# Patient Record
Sex: Female | Born: 1960 | Race: White | Hispanic: No | Marital: Single | Smoking: Never smoker
Health system: Southern US, Community
[De-identification: ages and names within clinical notes are randomized; demographics above are authoritative.]

## PROBLEM LIST (undated history)

## (undated) DIAGNOSIS — D649 Anemia, unspecified: Secondary | ICD-10-CM

## (undated) DIAGNOSIS — K219 Gastro-esophageal reflux disease without esophagitis: Secondary | ICD-10-CM

## (undated) DIAGNOSIS — I10 Essential (primary) hypertension: Secondary | ICD-10-CM

## (undated) DIAGNOSIS — N200 Calculus of kidney: Secondary | ICD-10-CM

## (undated) DIAGNOSIS — N12 Tubulo-interstitial nephritis, not specified as acute or chronic: Secondary | ICD-10-CM

## (undated) HISTORY — PX: HAND SURGERY: SHX662

## (undated) HISTORY — PX: HERNIA REPAIR: SHX51

---

## 2008-04-09 ENCOUNTER — Emergency Department: Payer: Self-pay | Admitting: Emergency Medicine

## 2011-11-24 ENCOUNTER — Encounter (HOSPITAL_BASED_OUTPATIENT_CLINIC_OR_DEPARTMENT_OTHER): Payer: Self-pay | Admitting: *Deleted

## 2011-11-24 ENCOUNTER — Emergency Department (INDEPENDENT_AMBULATORY_CARE_PROVIDER_SITE_OTHER): Payer: BC Managed Care – PPO

## 2011-11-24 ENCOUNTER — Emergency Department (HOSPITAL_BASED_OUTPATIENT_CLINIC_OR_DEPARTMENT_OTHER)
Admission: EM | Admit: 2011-11-24 | Discharge: 2011-11-24 | Disposition: A | Discharge status: Home / Self Care | Payer: BC Managed Care – PPO | Attending: Emergency Medicine | Admitting: Emergency Medicine

## 2011-11-24 DIAGNOSIS — M25529 Pain in unspecified elbow: Secondary | ICD-10-CM | POA: Insufficient documentation

## 2011-11-24 DIAGNOSIS — M7730 Calcaneal spur, unspecified foot: Secondary | ICD-10-CM | POA: Insufficient documentation

## 2011-11-24 DIAGNOSIS — M6580 Other synovitis and tenosynovitis, unspecified site: Secondary | ICD-10-CM | POA: Insufficient documentation

## 2011-11-24 DIAGNOSIS — M778 Other enthesopathies, not elsewhere classified: Secondary | ICD-10-CM

## 2011-11-24 DIAGNOSIS — M898X9 Other specified disorders of bone, unspecified site: Secondary | ICD-10-CM

## 2011-11-24 MED ORDER — HYDROCODONE-ACETAMINOPHEN 5-325 MG PO TABS: 2.0000 | ORAL_TABLET | ORAL | Status: AC | PRN

## 2011-11-24 NOTE — ED Provider Notes (Signed)
Medical screening examination/treatment/procedure(s) were performed by non-physician practitioner and as supervising physician I was immediately available for consultation/collaboration.  Raeford Razor, MD 11/24/11 2207

## 2011-11-24 NOTE — ED Notes (Signed)
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 Wearing support at triage. Pain has gotten worse since Friday. No relief with Ibuprofen.

## 2011-11-24 NOTE — ED Provider Notes (Addendum)
History     CSN: [id number]  Arrival date & time 11/24/11  1547   First MD Initiated Contact with Patient 11/24/11 1624      Chief Complaint  Patient presents with  . 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 female presenting with arm injury. The history is provided by the patient. No language interpreter was used.  Arm Injury  The incident occurred more than 2 days ago. The injury mechanism is unknown. It is unknown if the wounds were self-inflicted. No protective equipment was used. There is an injury to the right elbow. Pertinent negatives include no numbness and no neck pain.    History reviewed. No pertinent past medical history.  Past Surgical History  Procedure Date  . Hand surgery     History reviewed. No pertinent family history.  History  Substance Use Topics  . Smoking status: Never Smoker   . Smokeless tobacco: Not on file  . Alcohol Use: No    OB History    Grav Para Term Preterm Abortions TAB SAB Ect Mult Living                  Review of Systems  HENT: Negative for neck pain.   Neurological: Negative for numbness.  All other systems reviewed and are negative.    Allergies  Demerol and Pineapple  Home Medications   Current Outpatient Rx  Name Route Sig Dispense Refill  . IBUPROFEN 200 MG PO TABS Oral Take 1,000 mg by mouth once as needed. For pain    . HYDROCODONE-ACETAMINOPHEN 5-325 MG PO TABS Oral Take 2 tablets by mouth every 4 (four) hours as needed for pain. 15 tablet 0    BP 157/73  Pulse 74  Temp(Src) 98.5 F (36.9 C) (Oral)  Resp 20  Ht 5\' 9"  (1.753 m)  Wt 230 lb (104.327 kg)  BMI 33.96 kg/m2  SpO2  100%  LMP 11/07/2011  Physical Exam  Nursing note and vitals reviewed. Constitutional: She is oriented to person, place, and time. She appears well-nourished.  Cardiovascular: Normal rate and regular rhythm.   Pulmonary/Chest: Effort normal and breath sounds normal.  Musculoskeletal: Normal range of motion.       Pt tender to the right elbow:no redness or warmth noted to the area  Neurological: She is alert and oriented to person, place, and time.    ED Course  Procedures (including critical care time)  Labs Reviewed - No data to display Dg Elbow Complete Right  11/24/2011  *RADIOLOGY REPORT*  Clinical Data: Elbow pain.  No known injury.  RIGHT ELBOW - COMPLETE 3+ VIEW  Comparison:  None.  Findings:  There is no evidence of fracture, dislocation, or joint effusion.  There is no evidence of arthropathy.  Small bone spur is seen arising from the olecranon process.  Soft tissues are unremarkable.  IMPRESSION: 1.  No acute findings. 2.  Small olecranon bone spur.  Original Report Authenticated By: Danae Orleans, M.D.     1. Elbow tendinitis       MDM  Will treat symptomatically        Teressa Lower, NP 11/24/11 1826  Teressa Lower, NP 12/10/11 1541

## 2011-12-12 NOTE — ED Provider Notes (Signed)
Medical screening examination/treatment/procedure(s) were performed by non-physician practitioner and as supervising physician I was immediately available for consultation/collaboration.  Dwight Burdo, MD 12/12/11 0800 

## 2013-06-17 DIAGNOSIS — E209 Hypoparathyroidism, unspecified: Secondary | ICD-10-CM | POA: Insufficient documentation

## 2013-06-17 DIAGNOSIS — E282 Polycystic ovarian syndrome: Secondary | ICD-10-CM | POA: Diagnosis present

## 2013-06-17 DIAGNOSIS — E559 Vitamin D deficiency, unspecified: Secondary | ICD-10-CM | POA: Insufficient documentation

## 2014-08-03 DIAGNOSIS — N12 Tubulo-interstitial nephritis, not specified as acute or chronic: Secondary | ICD-10-CM

## 2014-08-03 HISTORY — DX: Tubulo-interstitial nephritis, not specified as acute or chronic: N12

## 2017-11-03 ENCOUNTER — Emergency Department (HOSPITAL_BASED_OUTPATIENT_CLINIC_OR_DEPARTMENT_OTHER): Payer: BLUE CROSS/BLUE SHIELD

## 2017-11-03 ENCOUNTER — Ambulatory Visit (HOSPITAL_COMMUNITY)
Admission: RE | Admit: 2017-11-03 | Discharge: 2017-11-03 | Disposition: A | Discharge status: Home / Self Care | Payer: BLUE CROSS/BLUE SHIELD | Source: Ambulatory Visit

## 2017-11-03 ENCOUNTER — Inpatient Hospital Stay (HOSPITAL_BASED_OUTPATIENT_CLINIC_OR_DEPARTMENT_OTHER)
Admission: EM | Admit: 2017-11-03 | Discharge: 2017-12-19 | DRG: 438 | Disposition: E | Payer: BLUE CROSS/BLUE SHIELD | Attending: Pulmonary Disease | Admitting: Pulmonary Disease

## 2017-11-03 ENCOUNTER — Inpatient Hospital Stay (HOSPITAL_COMMUNITY): Payer: BLUE CROSS/BLUE SHIELD

## 2017-11-03 ENCOUNTER — Ambulatory Visit (HOSPITAL_COMMUNITY): Admission: EM | Admit: 2017-11-03 | Payer: BLUE CROSS/BLUE SHIELD | Source: Other Acute Inpatient Hospital

## 2017-11-03 ENCOUNTER — Encounter (HOSPITAL_BASED_OUTPATIENT_CLINIC_OR_DEPARTMENT_OTHER): Payer: Self-pay | Admitting: *Deleted

## 2017-11-03 ENCOUNTER — Other Ambulatory Visit: Payer: Self-pay

## 2017-11-03 DIAGNOSIS — B370 Candidal stomatitis: Secondary | ICD-10-CM | POA: Diagnosis not present

## 2017-11-03 DIAGNOSIS — J9601 Acute respiratory failure with hypoxia: Secondary | ICD-10-CM | POA: Diagnosis not present

## 2017-11-03 DIAGNOSIS — K7581 Nonalcoholic steatohepatitis (NASH): Secondary | ICD-10-CM | POA: Diagnosis present

## 2017-11-03 DIAGNOSIS — G9341 Metabolic encephalopathy: Secondary | ICD-10-CM | POA: Diagnosis not present

## 2017-11-03 DIAGNOSIS — K851 Biliary acute pancreatitis without necrosis or infection: Secondary | ICD-10-CM | POA: Diagnosis present

## 2017-11-03 DIAGNOSIS — E8770 Fluid overload, unspecified: Secondary | ICD-10-CM | POA: Diagnosis not present

## 2017-11-03 DIAGNOSIS — R0902 Hypoxemia: Secondary | ICD-10-CM

## 2017-11-03 DIAGNOSIS — E870 Hyperosmolality and hypernatremia: Secondary | ICD-10-CM | POA: Diagnosis present

## 2017-11-03 DIAGNOSIS — D649 Anemia, unspecified: Secondary | ICD-10-CM | POA: Diagnosis present

## 2017-11-03 DIAGNOSIS — Z452 Encounter for adjustment and management of vascular access device: Secondary | ICD-10-CM

## 2017-11-03 DIAGNOSIS — Z91018 Allergy to other foods: Secondary | ICD-10-CM

## 2017-11-03 DIAGNOSIS — K8500 Idiopathic acute pancreatitis without necrosis or infection: Secondary | ICD-10-CM | POA: Diagnosis not present

## 2017-11-03 DIAGNOSIS — J9602 Acute respiratory failure with hypercapnia: Secondary | ICD-10-CM | POA: Diagnosis not present

## 2017-11-03 DIAGNOSIS — Z885 Allergy status to narcotic agent status: Secondary | ICD-10-CM

## 2017-11-03 DIAGNOSIS — K8689 Other specified diseases of pancreas: Secondary | ICD-10-CM | POA: Diagnosis not present

## 2017-11-03 DIAGNOSIS — E282 Polycystic ovarian syndrome: Secondary | ICD-10-CM | POA: Diagnosis present

## 2017-11-03 DIAGNOSIS — K861 Other chronic pancreatitis: Secondary | ICD-10-CM | POA: Diagnosis not present

## 2017-11-03 DIAGNOSIS — N179 Acute kidney failure, unspecified: Secondary | ICD-10-CM | POA: Diagnosis not present

## 2017-11-03 DIAGNOSIS — R188 Other ascites: Secondary | ICD-10-CM | POA: Diagnosis present

## 2017-11-03 DIAGNOSIS — Z0189 Encounter for other specified special examinations: Secondary | ICD-10-CM

## 2017-11-03 DIAGNOSIS — R110 Nausea: Secondary | ICD-10-CM | POA: Diagnosis not present

## 2017-11-03 DIAGNOSIS — B952 Enterococcus as the cause of diseases classified elsewhere: Secondary | ICD-10-CM | POA: Diagnosis present

## 2017-11-03 DIAGNOSIS — Z6841 Body Mass Index (BMI) 40.0 and over, adult: Secondary | ICD-10-CM

## 2017-11-03 DIAGNOSIS — R011 Cardiac murmur, unspecified: Secondary | ICD-10-CM | POA: Diagnosis present

## 2017-11-03 DIAGNOSIS — R0602 Shortness of breath: Secondary | ICD-10-CM

## 2017-11-03 DIAGNOSIS — N170 Acute kidney failure with tubular necrosis: Secondary | ICD-10-CM | POA: Diagnosis present

## 2017-11-03 DIAGNOSIS — K8591 Acute pancreatitis with uninfected necrosis, unspecified: Secondary | ICD-10-CM | POA: Diagnosis not present

## 2017-11-03 DIAGNOSIS — K2980 Duodenitis without bleeding: Secondary | ICD-10-CM | POA: Diagnosis present

## 2017-11-03 DIAGNOSIS — K863 Pseudocyst of pancreas: Secondary | ICD-10-CM | POA: Diagnosis present

## 2017-11-03 DIAGNOSIS — Z4659 Encounter for fitting and adjustment of other gastrointestinal appliance and device: Secondary | ICD-10-CM

## 2017-11-03 DIAGNOSIS — E875 Hyperkalemia: Secondary | ICD-10-CM | POA: Diagnosis not present

## 2017-11-03 DIAGNOSIS — N390 Urinary tract infection, site not specified: Secondary | ICD-10-CM | POA: Diagnosis present

## 2017-11-03 DIAGNOSIS — K8512 Biliary acute pancreatitis with infected necrosis: Secondary | ICD-10-CM | POA: Diagnosis not present

## 2017-11-03 DIAGNOSIS — Z515 Encounter for palliative care: Secondary | ICD-10-CM | POA: Diagnosis present

## 2017-11-03 DIAGNOSIS — Z978 Presence of other specified devices: Secondary | ICD-10-CM

## 2017-11-03 DIAGNOSIS — J9811 Atelectasis: Secondary | ICD-10-CM | POA: Diagnosis present

## 2017-11-03 DIAGNOSIS — K8020 Calculus of gallbladder without cholecystitis without obstruction: Secondary | ICD-10-CM

## 2017-11-03 DIAGNOSIS — A419 Sepsis, unspecified organism: Secondary | ICD-10-CM | POA: Diagnosis not present

## 2017-11-03 DIAGNOSIS — R1013 Epigastric pain: Secondary | ICD-10-CM | POA: Diagnosis present

## 2017-11-03 DIAGNOSIS — Z87442 Personal history of urinary calculi: Secondary | ICD-10-CM

## 2017-11-03 DIAGNOSIS — D6489 Other specified anemias: Secondary | ICD-10-CM | POA: Diagnosis present

## 2017-11-03 DIAGNOSIS — J9621 Acute and chronic respiratory failure with hypoxia: Secondary | ICD-10-CM | POA: Diagnosis not present

## 2017-11-03 DIAGNOSIS — N183 Chronic kidney disease, stage 3 (moderate): Secondary | ICD-10-CM | POA: Diagnosis present

## 2017-11-03 DIAGNOSIS — Z9911 Dependence on respirator [ventilator] status: Secondary | ICD-10-CM

## 2017-11-03 DIAGNOSIS — K859 Acute pancreatitis without necrosis or infection, unspecified: Secondary | ICD-10-CM | POA: Diagnosis not present

## 2017-11-03 DIAGNOSIS — Z66 Do not resuscitate: Secondary | ICD-10-CM | POA: Diagnosis not present

## 2017-11-03 DIAGNOSIS — K8511 Biliary acute pancreatitis with uninfected necrosis: Principal | ICD-10-CM | POA: Diagnosis present

## 2017-11-03 DIAGNOSIS — E43 Unspecified severe protein-calorie malnutrition: Secondary | ICD-10-CM | POA: Diagnosis present

## 2017-11-03 DIAGNOSIS — K567 Ileus, unspecified: Secondary | ICD-10-CM | POA: Diagnosis not present

## 2017-11-03 DIAGNOSIS — R5381 Other malaise: Secondary | ICD-10-CM | POA: Diagnosis not present

## 2017-11-03 DIAGNOSIS — R109 Unspecified abdominal pain: Secondary | ICD-10-CM | POA: Diagnosis not present

## 2017-11-03 DIAGNOSIS — Y95 Nosocomial condition: Secondary | ICD-10-CM | POA: Diagnosis not present

## 2017-11-03 DIAGNOSIS — R1084 Generalized abdominal pain: Secondary | ICD-10-CM | POA: Diagnosis not present

## 2017-11-03 DIAGNOSIS — R0603 Acute respiratory distress: Secondary | ICD-10-CM

## 2017-11-03 DIAGNOSIS — K8070 Calculus of gallbladder and bile duct without cholecystitis without obstruction: Secondary | ICD-10-CM | POA: Diagnosis present

## 2017-11-03 DIAGNOSIS — J189 Pneumonia, unspecified organism: Secondary | ICD-10-CM | POA: Diagnosis not present

## 2017-11-03 DIAGNOSIS — Z79899 Other long term (current) drug therapy: Secondary | ICD-10-CM

## 2017-11-03 DIAGNOSIS — E874 Mixed disorder of acid-base balance: Secondary | ICD-10-CM | POA: Diagnosis present

## 2017-11-03 DIAGNOSIS — J9600 Acute respiratory failure, unspecified whether with hypoxia or hypercapnia: Secondary | ICD-10-CM

## 2017-11-03 DIAGNOSIS — R140 Abdominal distension (gaseous): Secondary | ICD-10-CM | POA: Diagnosis not present

## 2017-11-03 DIAGNOSIS — Z01818 Encounter for other preprocedural examination: Secondary | ICD-10-CM

## 2017-11-03 DIAGNOSIS — R6521 Severe sepsis with septic shock: Secondary | ICD-10-CM | POA: Diagnosis not present

## 2017-11-03 DIAGNOSIS — K219 Gastro-esophageal reflux disease without esophagitis: Secondary | ICD-10-CM | POA: Diagnosis present

## 2017-11-03 DIAGNOSIS — D72829 Elevated white blood cell count, unspecified: Secondary | ICD-10-CM | POA: Diagnosis not present

## 2017-11-03 DIAGNOSIS — K831 Obstruction of bile duct: Secondary | ICD-10-CM | POA: Diagnosis not present

## 2017-11-03 DIAGNOSIS — K2970 Gastritis, unspecified, without bleeding: Secondary | ICD-10-CM | POA: Diagnosis present

## 2017-11-03 DIAGNOSIS — F419 Anxiety disorder, unspecified: Secondary | ICD-10-CM | POA: Diagnosis present

## 2017-11-03 DIAGNOSIS — R6511 Systemic inflammatory response syndrome (SIRS) of non-infectious origin with acute organ dysfunction: Secondary | ICD-10-CM | POA: Diagnosis not present

## 2017-11-03 DIAGNOSIS — E876 Hypokalemia: Secondary | ICD-10-CM | POA: Diagnosis not present

## 2017-11-03 DIAGNOSIS — K8502 Idiopathic acute pancreatitis with infected necrosis: Secondary | ICD-10-CM | POA: Diagnosis not present

## 2017-11-03 DIAGNOSIS — I129 Hypertensive chronic kidney disease with stage 1 through stage 4 chronic kidney disease, or unspecified chronic kidney disease: Secondary | ICD-10-CM | POA: Diagnosis present

## 2017-11-03 DIAGNOSIS — J9690 Respiratory failure, unspecified, unspecified whether with hypoxia or hypercapnia: Secondary | ICD-10-CM

## 2017-11-03 DIAGNOSIS — R739 Hyperglycemia, unspecified: Secondary | ICD-10-CM | POA: Diagnosis present

## 2017-11-03 DIAGNOSIS — N200 Calculus of kidney: Secondary | ICD-10-CM

## 2017-11-03 DIAGNOSIS — E869 Volume depletion, unspecified: Secondary | ICD-10-CM | POA: Diagnosis present

## 2017-11-03 DIAGNOSIS — N946 Dysmenorrhea, unspecified: Secondary | ICD-10-CM | POA: Diagnosis not present

## 2017-11-03 HISTORY — DX: Anemia, unspecified: D64.9

## 2017-11-03 HISTORY — DX: Essential (primary) hypertension: I10

## 2017-11-03 HISTORY — DX: Gastro-esophageal reflux disease without esophagitis: K21.9

## 2017-11-03 HISTORY — DX: Calculus of kidney: N20.0

## 2017-11-03 HISTORY — DX: Tubulo-interstitial nephritis, not specified as acute or chronic: N12

## 2017-11-03 LAB — URINALYSIS, MICROSCOPIC (REFLEX)

## 2017-11-03 LAB — URINALYSIS, ROUTINE W REFLEX MICROSCOPIC
Glucose, UA: NEGATIVE mg/dL
Hgb urine dipstick: NEGATIVE
Ketones, ur: 15 mg/dL — AB
Leukocytes, UA: NEGATIVE
NITRITE: NEGATIVE
PH: 7 (ref 5.0–8.0)
Protein, ur: 30 mg/dL — AB
SPECIFIC GRAVITY, URINE: 1.01 (ref 1.005–1.030)

## 2017-11-03 LAB — RAPID URINE DRUG SCREEN, HOSP PERFORMED
AMPHETAMINES: NOT DETECTED
Barbiturates: NOT DETECTED
Benzodiazepines: NOT DETECTED
Cocaine: NOT DETECTED
Opiates: POSITIVE — AB
TETRAHYDROCANNABINOL: NOT DETECTED

## 2017-11-03 LAB — CBC WITH DIFFERENTIAL/PLATELET
BASOS PCT: 0 %
Basophils Absolute: 0 10*3/uL (ref 0.0–0.1)
Eosinophils Absolute: 0.1 10*3/uL (ref 0.0–0.7)
Eosinophils Relative: 1 %
HEMATOCRIT: 46 % (ref 36.0–46.0)
HEMOGLOBIN: 16.2 g/dL — AB (ref 12.0–15.0)
LYMPHS ABS: 1.3 10*3/uL (ref 0.7–4.0)
LYMPHS PCT: 12 %
MCH: 30.2 pg (ref 26.0–34.0)
MCHC: 35.2 g/dL (ref 30.0–36.0)
MCV: 85.7 fL (ref 78.0–100.0)
MONOS PCT: 7 %
Monocytes Absolute: 0.8 10*3/uL (ref 0.1–1.0)
NEUTROS ABS: 9.2 10*3/uL — AB (ref 1.7–7.7)
NEUTROS PCT: 80 %
Platelets: 327 10*3/uL (ref 150–400)
RBC: 5.37 MIL/uL — ABNORMAL HIGH (ref 3.87–5.11)
RDW: 14.5 % (ref 11.5–15.5)
WBC: 11.5 10*3/uL — ABNORMAL HIGH (ref 4.0–10.5)

## 2017-11-03 LAB — COMPREHENSIVE METABOLIC PANEL
ALT: 601 U/L — ABNORMAL HIGH (ref 14–54)
ANION GAP: 13 (ref 5–15)
AST: 497 U/L — ABNORMAL HIGH (ref 15–41)
Albumin: 4.4 g/dL (ref 3.5–5.0)
Alkaline Phosphatase: 159 U/L — ABNORMAL HIGH (ref 38–126)
BUN: 16 mg/dL (ref 6–20)
CALCIUM: 10 mg/dL (ref 8.9–10.3)
CHLORIDE: 102 mmol/L (ref 101–111)
CO2: 24 mmol/L (ref 22–32)
Creatinine, Ser: 1.33 mg/dL — ABNORMAL HIGH (ref 0.44–1.00)
GFR calc non Af Amer: 44 mL/min — ABNORMAL LOW (ref >60)
GFR, EST AFRICAN AMERICAN: 51 mL/min — AB (ref >60)
Glucose, Bld: 131 mg/dL — ABNORMAL HIGH (ref 65–99)
POTASSIUM: 3.5 mmol/L (ref 3.5–5.1)
SODIUM: 139 mmol/L (ref 135–145)
Total Bilirubin: 4.4 mg/dL — ABNORMAL HIGH (ref 0.3–1.2)
Total Protein: 7.9 g/dL (ref 6.5–8.1)

## 2017-11-03 LAB — MRSA PCR SCREENING: MRSA BY PCR: NEGATIVE

## 2017-11-03 LAB — LIPASE, BLOOD: Lipase: >10000 U/L — ABNORMAL HIGH (ref 11–51)

## 2017-11-03 LAB — ETHANOL: Alcohol, Ethyl (B): <10 mg/dL (ref <10)

## 2017-11-03 LAB — LACTATE DEHYDROGENASE: LDH: 404 U/L — ABNORMAL HIGH (ref 98–192)

## 2017-11-03 MED ORDER — LORAZEPAM 2 MG/ML IJ SOLN
1.0000 mg | Freq: Once | INTRAMUSCULAR | Status: AC
  Administered 2017-11-03: 1 mg via INTRAVENOUS
  Filled 2017-11-03: qty 1

## 2017-11-03 MED ORDER — FENTANYL CITRATE (PF) 100 MCG/2ML IJ SOLN
25.0000 ug | Freq: Once | INTRAMUSCULAR | Status: AC
  Administered 2017-11-03: 25 ug via INTRAVENOUS
  Filled 2017-11-03: qty 2

## 2017-11-03 MED ORDER — METOCLOPRAMIDE HCL 5 MG/ML IJ SOLN
10.0000 mg | Freq: Once | INTRAMUSCULAR | Status: DC
  Filled 2017-11-03: qty 2

## 2017-11-03 MED ORDER — HALOPERIDOL LACTATE 5 MG/ML IJ SOLN
2.0000 mg | Freq: Once | INTRAMUSCULAR | Status: AC
  Administered 2017-11-03: 2 mg via INTRAVENOUS
  Filled 2017-11-03: qty 1

## 2017-11-03 MED ORDER — SODIUM CHLORIDE 0.9 % IV BOLUS (SEPSIS)
1000.0000 mL | Freq: Once | INTRAVENOUS | Status: AC
  Administered 2017-11-03: 1000 mL via INTRAVENOUS

## 2017-11-03 MED ORDER — HYDROMORPHONE HCL 1 MG/ML IJ SOLN: 1.0000 mg | INTRAMUSCULAR | Status: DC | PRN

## 2017-11-03 MED ORDER — HYDROMORPHONE HCL 1 MG/ML IJ SOLN
0.5000 mg | Freq: Once | INTRAMUSCULAR | Status: DC
  Filled 2017-11-03: qty 1

## 2017-11-03 MED ORDER — ENOXAPARIN SODIUM 40 MG/0.4ML ~~LOC~~ SOLN
40.0000 mg | SUBCUTANEOUS | Status: DC
  Administered 2017-11-03 – 2017-11-04 (×2): 40 mg via SUBCUTANEOUS
  Filled 2017-11-03 (×2): qty 0.4

## 2017-11-03 MED ORDER — ONDANSETRON HCL 4 MG/2ML IJ SOLN
4.0000 mg | Freq: Four times a day (QID) | INTRAMUSCULAR | Status: DC | PRN
  Administered 2017-11-04 – 2017-11-05 (×3): 4 mg via INTRAVENOUS
  Filled 2017-11-03 (×3): qty 2

## 2017-11-03 MED ORDER — FENTANYL CITRATE (PF) 100 MCG/2ML IJ SOLN
50.0000 ug | Freq: Once | INTRAMUSCULAR | Status: AC
  Administered 2017-11-03: 50 ug via INTRAVENOUS
  Filled 2017-11-03: qty 2

## 2017-11-03 MED ORDER — METOCLOPRAMIDE HCL 5 MG/ML IJ SOLN
10.0000 mg | Freq: Once | INTRAMUSCULAR | Status: AC
  Administered 2017-11-03: 10 mg via INTRAVENOUS
  Filled 2017-11-03: qty 2

## 2017-11-03 MED ORDER — MORPHINE SULFATE (PF) 4 MG/ML IV SOLN
8.0000 mg | Freq: Once | INTRAVENOUS | Status: DC
  Filled 2017-11-03: qty 2

## 2017-11-03 MED ORDER — MORPHINE SULFATE (PF) 4 MG/ML IV SOLN
4.0000 mg | Freq: Once | INTRAVENOUS | Status: AC
  Administered 2017-11-03: 4 mg via INTRAVENOUS
  Filled 2017-11-03: qty 1

## 2017-11-03 MED ORDER — PROMETHAZINE HCL 25 MG/ML IJ SOLN
12.5000 mg | Freq: Once | INTRAMUSCULAR | Status: DC
  Filled 2017-11-03: qty 1

## 2017-11-03 MED ORDER — SODIUM CHLORIDE 0.9 % IV SOLN
INTRAVENOUS | Status: DC
  Administered 2017-11-03 – 2017-11-04 (×3): via INTRAVENOUS

## 2017-11-03 MED ORDER — PROMETHAZINE HCL 25 MG/ML IJ SOLN
25.0000 mg | Freq: Once | INTRAMUSCULAR | Status: AC
Start: 2017-11-03 — End: 2017-11-03
  Administered 2017-11-03: 25 mg via INTRAVENOUS

## 2017-11-03 MED ORDER — PIPERACILLIN-TAZOBACTAM 3.375 G IVPB 30 MIN
3.3750 g | Freq: Once | INTRAVENOUS | Status: AC
  Administered 2017-11-03: 3.375 g via INTRAVENOUS
  Filled 2017-11-03 (×2): qty 50

## 2017-11-03 MED ORDER — PROMETHAZINE HCL 25 MG/ML IJ SOLN
12.5000 mg | Freq: Once | INTRAMUSCULAR | Status: AC
  Administered 2017-11-03: 12.5 mg via INTRAVENOUS
  Filled 2017-11-03: qty 1

## 2017-11-03 MED ORDER — HYDROMORPHONE HCL 1 MG/ML IJ SOLN: 1.0000 mg | Freq: Once | INTRAMUSCULAR | Status: DC

## 2017-11-03 MED ORDER — ONDANSETRON HCL 4 MG/2ML IJ SOLN
4.0000 mg | Freq: Once | INTRAMUSCULAR | Status: AC
  Administered 2017-11-03: 4 mg via INTRAVENOUS
  Filled 2017-11-03: qty 2

## 2017-11-03 MED ORDER — MORPHINE SULFATE (PF) 4 MG/ML IV SOLN
2.0000 mg | INTRAVENOUS | Status: DC | PRN
  Administered 2017-11-03 – 2017-11-04 (×4): 2 mg via INTRAVENOUS
  Filled 2017-11-03 (×4): qty 1

## 2017-11-03 MED ORDER — FENTANYL CITRATE (PF) 100 MCG/2ML IJ SOLN
50.0000 ug | Freq: Once | INTRAMUSCULAR | Status: AC
Start: 2017-11-03 — End: 2017-11-03
  Administered 2017-11-03: 50 ug via INTRAVENOUS
  Filled 2017-11-03: qty 2

## 2017-11-03 MED ORDER — METOCLOPRAMIDE HCL 5 MG/ML IJ SOLN
5.0000 mg | Freq: Four times a day (QID) | INTRAMUSCULAR | Status: DC | PRN
  Administered 2017-11-04 – 2017-11-06 (×7): 5 mg via INTRAVENOUS
  Filled 2017-11-03 (×7): qty 2

## 2017-11-03 MED ORDER — HYDRALAZINE HCL 20 MG/ML IJ SOLN: 10.0000 mg | Freq: Four times a day (QID) | INTRAMUSCULAR | Status: DC | PRN

## 2017-11-03 MED ORDER — PANTOPRAZOLE SODIUM 40 MG IV SOLR
40.0000 mg | Freq: Once | INTRAVENOUS | Status: AC
  Administered 2017-11-03: 40 mg via INTRAVENOUS
  Filled 2017-11-03: qty 40

## 2017-11-03 MED ORDER — MORPHINE SULFATE (PF) 4 MG/ML IV SOLN
1.0000 mg | INTRAVENOUS | Status: DC | PRN
  Administered 2017-11-03: 1 mg via INTRAVENOUS
  Filled 2017-11-03: qty 1

## 2017-11-03 NOTE — ED Provider Notes (Signed)
[facility] EMERGENCY DEPARTMENT Provider Note   CSN: [id number] Arrival date & time: 24-Nov-2017  1031     History   Chief Complaint Chief Complaint  Patient presents with  . Flank Pain    HPI Emma Washington is a 57 y.o. female.  HPI 57 year old female past medical history significant for nephrolithiasis, hypertension presents to the emergency department today for evaluation of left flank pain with associated hematuria and dysuria.  Patient states that she has a known renal stone and has seen urology in the past.  Patient states the pain started yesterday and has progressively worsened.  Patient describes the pain as sharp in nature.  She states that "feels like it is moving through my bladder".  Patient reports associated nausea and emesis.  She has had several episodes of bilious emesis prior to arrival.  Patient rates the pain a 10 out of 10.  She try to take 3 Advil this morning for pain however she states that she threw them up.  Patient denies any associated fevers or chills.  She denies any changes in her bowel habits.  Denies any associated abdominal pain.  Nothing makes it better or worse.  Pt denies any fever, chill, ha, vision changes, lightheadedness, dizziness, congestion, neck pain, cp, sob, cough, abd pain, n/v/d, change in bowel habits, melena, hematochezia, lower extremity paresthesias.  Past Medical History:  Diagnosis Date  . Hypertension     There are no active problems to display for this patient.   Past Surgical History:  Procedure Laterality Date  . HAND SURGERY      OB History    No data available       Home Medications    Prior to Admission medications   Medication Sig Start Date End Date Taking? Authorizing Provider  LISINOPRIL-HYDROCHLOROTHIAZIDE PO Take by mouth.   Yes [provider]  promethazine (PHENERGAN) 25 MG tablet Take 25 mg by mouth every 6 (six) hours as needed for nausea or vomiting.   Yes [provider]    ibuprofen (ADVIL,MOTRIN) 200 MG tablet Take 1,000 mg by mouth once as needed. For pain    [provider]    Family History No family history on file.  Social History Social History   Tobacco Use  . Smoking status: Never Smoker  . Smokeless tobacco: Never Used  Substance Use Topics  . Alcohol use: No  . Drug use: No     Allergies   Demerol and Pineapple   Review of Systems Review of Systems  Constitutional: Negative for chills and fever.  HENT: Negative for congestion.   Eyes: Negative for visual disturbance.  Respiratory: Negative for cough and shortness of breath.   Cardiovascular: Negative for chest pain.  Gastrointestinal: Positive for nausea and vomiting. Negative for abdominal pain and diarrhea.  Genitourinary: Positive for dysuria, flank pain, hematuria and urgency. Negative for frequency, vaginal bleeding and vaginal discharge.  Musculoskeletal: Negative for arthralgias and myalgias.  Skin: Negative for rash.  Neurological: Negative for dizziness, syncope, weakness, light-headedness, numbness and headaches.  Psychiatric/Behavioral: Negative for sleep disturbance. The patient is not nervous/anxious.      Physical Exam Updated Vital Signs Ht 5\' 9"  (1.753 m)   Wt 113.4 kg (250 lb)   LMP 11/07/2011   BMI 36.92 kg/m   Physical Exam  Constitutional: She is oriented to person, place, and time. She appears well-developed and well-nourished.  Non-toxic appearance. She appears distressed.  Patient very uncomfortable due to pain and vomiting.  She is moaning and rolling around on the bed in the room.  Difficult to obtain accurate history and physical exam.  HENT:  Head: Normocephalic and atraumatic.  Nose: Nose normal.  Mouth/Throat: Oropharynx is clear and moist.  Eyes: Conjunctivae are normal. Pupils are equal, round, and reactive to light. Right eye exhibits no discharge. Left eye exhibits no discharge.  Neck: Normal range of motion. Neck supple.   Cardiovascular: Normal rate, regular rhythm, normal heart sounds and intact distal pulses.  Pulmonary/Chest: Effort normal. No respiratory distress. She exhibits no tenderness.  Abdominal: Soft. Bowel sounds are normal. There is tenderness in the epigastric area and left upper quadrant. There is guarding and CVA tenderness. There is no rigidity, no rebound and no tenderness at McBurney's point.  CVA tenderness on the left.  Musculoskeletal: Normal range of motion. She exhibits no tenderness.  Lymphadenopathy:    She has no cervical adenopathy.  Neurological: She is alert and oriented to person, place, and time.  Skin: Skin is warm and dry. Capillary refill takes less than 2 seconds.  Psychiatric: Her behavior is normal. Judgment and thought content normal.  Nursing note and vitals reviewed.    ED Treatments / Results  Labs (all labs ordered are listed, but only abnormal results are displayed) Labs Reviewed  CBC WITH DIFFERENTIAL/PLATELET - Abnormal; Notable for the following components:      Result Value   WBC 11.5 (*)    RBC 5.37 (*)    Hemoglobin 16.2 (*)    Neutro Abs 9.2 (*)    All other components within normal limits  COMPREHENSIVE METABOLIC PANEL  URINALYSIS, ROUTINE W REFLEX MICROSCOPIC    EKG  EKG Interpretation None       Radiology [place] Abdomen Complete  Result Date: 11/01/2017 CLINICAL DATA:  Epigastric and right upper quadrant discomfort, syncope, vomiting, hematuria. EXAM: ABDOMEN ULTRASOUND COMPLETE COMPARISON:  Abdominal and pelvic CT scan of today's date FINDINGS: Gallbladder: Probable small stones. No gallbladder wall thickening or pericholecystic fluid or positive sonographic Murphy's sign. Common bile duct: Diameter: 6.7 mm Liver: Visualization of the hepatic parenchyma is limited by bowel poor penetration of the ultrasound beam. The echotexture is subjectively increased. There is no focal mass or ductal dilation. Portal vein is patent on color Doppler  imaging with normal direction of blood flow towards the liver. IVC: No abnormality visualized. Pancreas: Visualization of the pancreas is limited due to bowel gas. Spleen: Size and appearance within normal limits. Right Kidney: Length: 13.2 cm. Echogenicity within normal limits. No mass or hydronephrosis visualized. Left Kidney: Length: 11.9 cm. Echogenicity within normal limits. No mass or hydronephrosis visualized. Abdominal aorta: Bowel gas limits evaluation of the abdominal aorta. Other findings: No significant ascites is observed. IMPRESSION: The study is limited overall due to bowel gas and the patient's body habitus. Multiple tiny gallstones are suspected. There is mild prominence of the common bile duct without definite intraluminal stones. Increased hepatic echotexture compatible with fatty infiltrative change. Poor evaluation of the pancreas due to bowel gas and surrounding inflammatory changes. Electronically Signed   By: David  SwazilandJordan M.D.   On: 11/20/2017 14:02   Ct Renal Stone Study  Result Date: 10/25/2017 CLINICAL DATA:  Left-sided flank pain and hematuria.  Vomiting. EXAM: CT ABDOMEN AND PELVIS WITHOUT CONTRAST TECHNIQUE: Multidetector CT imaging of the abdomen and pelvis was performed following the standard protocol without oral or intravenous contrast material administration. COMPARISON:  CT abdomen and pelvis April 09, 2008 FINDINGS: Lower chest: There is bibasilar  atelectatic change. Hepatobiliary: There is hepatic steatosis. No focal liver lesions are evident on this noncontrast enhanced study. Gallbladder wall is not appreciably thickened. There is no biliary duct dilatation. Pancreas: There is edema throughout the pancreas with soft tissue stranding in the adjacent peripancreatic mesentery throughout the peripancreatic region. Fluid tracks to the right of the pancreatic head and inferior to the uncinate process as well as anterior to the pancreatic head. Fluid tracks from the body and tail  the pancreas anteriorly to reach the greater curvature of the stomach in the midportion of the gastric body region. A well-defined pseudocyst type lesion is not seen. There is no pancreatic duct dilatation. Spleen: No splenic lesions are evident. Adrenals/Urinary Tract: Adrenals bilaterally appear normal. Kidneys bilaterally show no evident mass or hydronephrosis. There is a 1 mm calculus in the upper pole left kidney, nonobstructing. There is no evident ureteral calculus on either side. Urinary bladder is midline with wall thickness within normal limits. Stomach/Bowel: There is mild wall thickening along the greater curvature of the stomach in the midbody region in along the second and third duodenum due to nearby pancreatitis. No other bowel wall thickening is evident. There is no appreciable bowel obstruction. No evident free air or portal venous air. Vascular/Lymphatic: There is no abdominal aortic aneurysm. No vascular lesions are appreciable on this study. No adenopathy is evident in the abdomen or pelvis. Reproductive: Uterus is anteverted. There is an apparent leiomyoma extending from the leftward aspect of the uterus anteriorly measuring 4.3 x 4.0 cm. No extrauterine pelvic mass evident. Other: Appendix appears normal. No abscess is seen in the abdomen or pelvis. No free pelvic fluid seen. Loculated fluid adjacent to the pancreas is noted. Musculoskeletal: There is degenerative change in the lumbar spine. There are no blastic or lytic bone lesions. There is no intramuscular or abdominal wall lesion evident. IMPRESSION: 1. Acute pancreatitis with pancreatic edema and peripancreatic fluid, most notably anterior to the body and tail of the pancreas. No well-defined pseudocyst or noncystic mass. No pancreatic duct dilatation. 2. Areas of gastritis and duodenitis due to adjacent pancreatitis causing inflammation in these areas. No bowel obstruction. No abscess. Appendix appears normal. 3. 1 mm nonobstructing  calculus upper pole left kidney. No hydronephrosis or ureteral calculus on either side. 4.  Hepatic steatosis. 5. 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 Electronically Signed   By: Bretta Bang III M.D.   On: 11/14/2017 12:04    Procedures Procedures (including critical care time)  Medications Ordered in ED Medications  morphine 4 MG/ML injection 4 mg (4 mg Intravenous Given 11/15/2017 1044)  ondansetron (ZOFRAN) injection 4 mg (4 mg Intravenous Given 10/23/2017 1044)  promethazine (PHENERGAN) injection 25 mg (25 mg Intravenous Given 11/20/2017 1053)     Initial Impression / Assessment and Plan / ED Course  I have reviewed the triage vital signs and the nursing notes.  Pertinent labs & imaging results that were available during my care of the patient were reviewed by me and considered in my medical decision making (see chart for details).     Patient presents to the ED with complaints of left flank pain with associated hematuria.  She also reports associated nausea, emesis.  Patient has known nephrolithiasis.   On exam patient is afebrile.  She is not hypotensive or tachycardic.  Patient is is very uncomfortable due to the pain and rolling around on the bed moaning.  Initial lab  work returned that shows a mild leukocytosis of 11,000.  She also has a mildly elevated creatinine of 1.33.  Her liver enzymes are significantly elevated.  Along with her bilirubin which is 4.4.  Lipase is pending at this time.  Patient's pain has been difficult to manage in the ED.  Patient is very sensitive to pain medication.  Her blood pressure keeps dropping with pain medicine.  Have ordered to bolus of IV fluids.  Patient's blood pressure is maintaining at 100 systolic at this time.  Additional pain medication was ordered.  Patient's was transported CT scan which was performed.    Ct abd/pelvis;  IMPRESSION: 1. Acute pancreatitis with  pancreatic edema and peripancreatic fluid, most notably anterior to the body and tail of the pancreas. No well-defined pseudocyst or noncystic mass. No pancreatic duct dilatation. 2. Areas of gastritis and duodenitis due to adjacent pancreatitis causing inflammation in these areas. No bowel obstruction. No abscess. Appendix appears normal. 3. 1 mm nonobstructing calculus upper pole left kidney. No hydronephrosis or ureteral calculus on either side. 4. Hepatic steatosis. 5. 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   Concern for gallstone pancreatitis.  Ultrasound of abdomen was ordered at this time to assess for common bile duct diameter.  Ultrasound was limited due to patient's body habitus and bowel gas.  Does report some small stones.  Common bile duct appears to be 6.7 mm.  No signs of cholecystic fluid or acute cholecystitis.  1445-I spoke with Marla Roe PA-C with [facility] who recommends that patient needs hospital admission.  She has no further recommendations at this time.  Spoke with Dr. Roda Shutters spittle medicine at [facility].  She is agreed to accept the patient in transfer for admission.  She is asked that I place a Foley and obtain a UDS, UA and alcohol level.  Patient remains hemodynamically stable at this time.  Her blood pressures have improved.  Patient continues have pain and nausea.  EKG was obtained to check for QT which was normal.  Haldol was given which has helped patient.  You are waiting for a bed at this time.  Patient still complains of pain at 1500.  Additional fentanyl was prescribed.  Patient resting comfortably in the bed at this time.  Patient vital signs remained reassuring with improving blood pressure.   Final Clinical Impressions(s) / ED Diagnoses   Final diagnoses:  Gallstone pancreatitis    ED Discharge Orders    None       Wallace Keller 11-10-17 1613    Shaune Pollack,  MD 11/04/17 [id number]

## 2017-11-03 NOTE — H&P (Signed)
TRH H&P   Patient Demographics:    Emma Washington, is a 57 y.o. female  MRN: [id number]   DOB - May 28, 1961  Admit Date - 10/28/2017  Outpatient Primary MD for the patient is Verneita Griffes, MD  Referring MD/NP/PA:  Ocie Cornfield  Outpatient Specialists:  Lawanna Kobus [place], [place])   Patient coming from: home   Chief Complaint  Patient presents with  . Flank Pain      HPI:    Emma Washington  is a 57 y.o. female, w Jerrye Bushy, h/o nephrolithiasis, apparently c/o epigastric, discomfort since 4 pm yesterday.  Pt had subsequent n/v, without any bloody emesis.  Pt states pain sharp, severe, fairly constant and then noted some ? Blood in urine,  Her urine was dark and she thought might have kidney stone and presented to [facility] for evaluation.   In, ED Na 139, K 3.5,  Glucose 131, Bun 16, Creatinine 1.33  Ast 497, Alt 601, Alk phos 159, T. Bili 4.4 Lipase >10,000 CT scan abd/ pelvis IMPRESSION: 1. Acute pancreatitis with pancreatic edema and peripancreatic fluid, most notably anterior to the body and tail of the pancreas. No well-defined pseudocyst or noncystic mass. No pancreatic duct dilatation.  2. Areas of gastritis and duodenitis due to adjacent pancreatitis causing inflammation in these areas. No bowel obstruction. No abscess. Appendix appears normal.  3. 1 mm nonobstructing calculus upper pole left kidney. No hydronephrosis or ureteral calculus on either side.  4.  Hepatic steatosis.  5. 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 Multiple tiny gallstones are suspected. There is mild prominence of the common bile duct without definite intraluminal stones.  Increased hepatic  echotexture compatible with fatty infiltrative change.  Poor evaluation of the pancreas due to bowel gas and surrounding inflammatory changes.  [facility] consulted in afternoon and recommended MRCP.  Pt was transported to [facility], and MRCP has been ordered.  I have contacted GI and notified them that the patient has arrived and we are trying to proceed with MRI as they have suggested.      Review of systems:    In addition to the HPI above,  No Fever-chills, No Headache, No changes with Vision or hearing, No problems swallowing food or Liquids, No Chest pain, Cough or Shortness of Breath, No Blood in stool or Urine, No dysuria, No new skin rashes or bruises, No new joints pains-aches,  No new weakness, tingling, numbness in any extremity, No recent weight gain or loss, No polyuria, polydypsia or polyphagia, No significant Mental Stressors.  A full 10 point Review of Systems was done, except as stated above, all other Review of Systems were negative.   With Past History of the  following :    Past Medical History:  Diagnosis Date  . Anemia   . GERD (gastroesophageal reflux disease)   . Hypertension   . Nephrolithiasis       Past Surgical History:  Procedure Laterality Date  . HAND SURGERY    . HERNIA REPAIR        Social History:     Social History   Tobacco Use  . Smoking status: Never Smoker  . Smokeless tobacco: Never Used  Substance Use Topics  . Alcohol use: No     Lives - at home  Mobility - walks by self   Family History :     Family History  Problem Relation Age of Onset  . Pancreatitis Brother       Home Medications:   Prior to Admission medications   Medication Sig Start Date End Date Taking? Authorizing Provider  ibuprofen (ADVIL,MOTRIN) 200 MG tablet Take 1,000 mg by mouth once as needed. For pain   Yes [provider]  lisinopril-hydrochlorothiazide (PRINZIDE,ZESTORETIC) 20-25 MG tablet Take 1 tablet by mouth daily as  needed (hypertension).    Yes [provider]  promethazine (PHENERGAN) 25 MG tablet Take 25 mg by mouth every 6 (six) hours as needed for nausea or vomiting.   Yes [provider]     Allergies:     Allergies  Allergen Reactions  . Demerol Other (See Comments)    Severe hypotension  . Meperidine     Other reaction(s): Hypotension, Hypotension (ALLERGY/intolerance)  . Other Anaphylaxis    Pineapple  . Pineapple Swelling and Anaphylaxis     Physical Exam:   Vitals  Blood pressure (!) 182/100, pulse 99, temperature 98.5 F (36.9 C), temperature source Oral, resp. rate 20, height '5\' 9"'  (1.753 m), weight 134.3 kg (296 lb 1.2 oz), last menstrual period 11/07/2011, SpO2 95 %.   1. General  lying in bed in NAD,    2. Normal affect and insight, Not Suicidal or Homicidal, Awake Alert, Oriented X 3.  3. No F.N deficits, ALL C.Nerves Intact, Strength 5/5 all 4 extremities, Sensation intact all 4 extremities, Plantars down going.  4. Ears and Eyes appear Normal, Conjunctivae clear, PERRLA. Moist Oral Mucosa.  5. Supple Neck, No JVD, No cervical lymphadenopathy appriciated, No Carotid Bruits.  6. Symmetrical Chest wall movement, Good air movement bilaterally, CTAB.  7. RRR, No Gallops, Rubs or Murmurs, No Parasternal Heave.  8. Positive Bowel Sounds, Abdomen Soft, No tenderness, No organomegaly appriciated,No rebound -guarding or rigidity.  9.  No Cyanosis, Normal Skin Turgor, No Skin Rash or Bruise.  10. Good muscle tone,  joints appear normal , no effusions, Normal ROM.  11. No Palpable Lymph Nodes in Neck or Axillae     Data Review:    CBC Recent Labs  Lab 11/12/2017 1041  WBC 11.5*  HGB 16.2*  HCT 46.0  PLT 327  MCV 85.7  MCH 30.2  MCHC 35.2  RDW 14.5  LYMPHSABS 1.3  MONOABS 0.8  EOSABS 0.1  BASOSABS 0.0   ------------------------------------------------------------------------------------------------------------------  Chemistries    Recent Labs  Lab 10/29/2017 1041  NA 139  K 3.5  CL 102  CO2 24  GLUCOSE 131*  BUN 16  CREATININE 1.33*  CALCIUM 10.0  AST 497*  ALT 601*  ALKPHOS 159*  BILITOT 4.4*   ------------------------------------------------------------------------------------------------------------------ estimated creatinine clearance is 70.5 mL/min (A) (by C-G formula based on SCr of 1.33 mg/dL (H)). ------------------------------------------------------------------------------------------------------------------ No results for input(s): TSH, T4TOTAL, T3FREE, THYROIDAB  in the last 72 hours.  Invalid input(s): FREET3  Coagulation profile No results for input(s): INR, PROTIME in the last 168 hours. ------------------------------------------------------------------------------------------------------------------- No results for input(s): DDIMER in the last 72 hours. -------------------------------------------------------------------------------------------------------------------  Cardiac Enzymes No results for input(s): CKMB, TROPONINI, MYOGLOBIN in the last 168 hours.  Invalid input(s): CK ------------------------------------------------------------------------------------------------------------------ No results found for: BNP   ---------------------------------------------------------------------------------------------------------------  Urinalysis    Component Value Date/Time   COLORURINE AMBER (A) 10/24/2017 1500   APPEARANCEUR CLEAR 10/23/2017 1500   LABSPEC 1.010 11/07/2017 1500   PHURINE 7.0 10/23/2017 1500   GLUCOSEU NEGATIVE 11/06/2017 1500   HGBUR NEGATIVE 10/27/2017 1500   BILIRUBINUR MODERATE (A) 10/30/2017 1500   KETONESUR 15 (A) 11/20/2017 1500   PROTEINUR 30 (A) 10/24/2017 1500   NITRITE NEGATIVE 10/24/2017 1500   LEUKOCYTESUR NEGATIVE 10/25/2017 1500    ----------------------------------------------------------------------------------------------------------------    Imaging Results:    US Abdomen Complete  Result Date: 11/19/2017 CLINICAL DATA:  Epigastric and right upper quadrant discomfort, syncope, vomiting, hematuria. EXAM: ABDOMEN ULTRASOUND COMPLETE COMPARISON:  Abdominal and pelvic CT scan of today's date FINDINGS: Gallbladder: Probable small stones. No gallbladder wall thickening or pericholecystic fluid or positive sonographic Murphy's sign. Common bile duct: Diameter: 6.7 mm Liver: Visualization of the hepatic parenchyma is limited by bowel poor penetration of the ultrasound beam. The echotexture is subjectively increased. There is no focal mass or ductal dilation. Portal vein is patent on color Doppler imaging with normal direction of blood flow towards the liver. IVC: No abnormality visualized. Pancreas: Visualization of the pancreas is limited due to bowel gas. Spleen: Size and appearance within normal limits. Right Kidney: Length: 13.2 cm. Echogenicity within normal limits. No mass or hydronephrosis visualized. Left Kidney: Length: 11.9 cm. Echogenicity within normal limits. No mass or hydronephrosis visualized. Abdominal aorta: Bowel gas limits evaluation of the abdominal aorta. Other findings: No significant ascites is observed. IMPRESSION: The study is limited overall due to bowel gas and the patient's body habitus. Multiple tiny gallstones are suspected. There is mild prominence of the common bile duct without definite intraluminal stones. Increased hepatic echotexture compatible with fatty infiltrative change. Poor evaluation of the pancreas due to bowel gas and surrounding inflammatory changes. Electronically Signed   By: David  Martinique M.D.   On: 10/29/2017 14:02   Ct Renal Stone Study  Result Date: 11/20/2017 CLINICAL DATA:  Left-sided flank pain and hematuria.  Vomiting. EXAM: CT ABDOMEN AND PELVIS WITHOUT CONTRAST TECHNIQUE: Multidetector CT imaging of the abdomen and pelvis was performed following the standard protocol without oral or intravenous  contrast material administration. COMPARISON:  CT abdomen and pelvis April 09, 2008 FINDINGS: Lower chest: There is bibasilar atelectatic change. Hepatobiliary: There is hepatic steatosis. No focal liver lesions are evident on this noncontrast enhanced study. Gallbladder wall is not appreciably thickened. There is no biliary duct dilatation. Pancreas: There is edema throughout the pancreas with soft tissue stranding in the adjacent peripancreatic mesentery throughout the peripancreatic region. Fluid tracks to the right of the pancreatic head and inferior to the uncinate process as well as anterior to the pancreatic head. Fluid tracks from the body and tail the pancreas anteriorly to reach the greater curvature of the stomach in the midportion of the gastric body region. A well-defined pseudocyst type lesion is not seen. There is no pancreatic duct dilatation. Spleen: No splenic lesions are evident. Adrenals/Urinary Tract: Adrenals bilaterally appear normal. Kidneys bilaterally show no evident mass or hydronephrosis. There is a 1 mm calculus in the upper pole left kidney,  nonobstructing. There is no evident ureteral calculus on either side. Urinary bladder is midline with wall thickness within normal limits. Stomach/Bowel: There is mild wall thickening along the greater curvature of the stomach in the midbody region in along the second and third duodenum due to nearby pancreatitis. No other bowel wall thickening is evident. There is no appreciable bowel obstruction. No evident free air or portal venous air. Vascular/Lymphatic: There is no abdominal aortic aneurysm. No vascular lesions are appreciable on this study. No adenopathy is evident in the abdomen or pelvis. Reproductive: Uterus is anteverted. There is an apparent leiomyoma extending from the leftward aspect of the uterus anteriorly measuring 4.3 x 4.0 cm. No extrauterine pelvic mass evident. Other: Appendix appears normal. No abscess is seen in the abdomen or  pelvis. No free pelvic fluid seen. Loculated fluid adjacent to the pancreas is noted. Musculoskeletal: There is degenerative change in the lumbar spine. There are no blastic or lytic bone lesions. There is no intramuscular or abdominal wall lesion evident. IMPRESSION: 1. Acute pancreatitis with pancreatic edema and peripancreatic fluid, most notably anterior to the body and tail of the pancreas. No well-defined pseudocyst or noncystic mass. No pancreatic duct dilatation. 2. Areas of gastritis and duodenitis due to adjacent pancreatitis causing inflammation in these areas. No bowel obstruction. No abscess. Appendix appears normal. 3. 1 mm nonobstructing calculus upper pole left kidney. No hydronephrosis or ureteral calculus on either side. 4.  Hepatic steatosis. 5. 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 Electronically Signed   By: Lowella Grip III M.D.   On: 11/13/2017 12:04       Assessment & Plan:    Principal Problem:   Pancreatitis    Pancreatitic, suspect gallstone pancreatitis Check LDH, check Lipid MRCP ordered NPO except for medications  Hydrate aggressively with ns iv GI contacted, appreciate input.   Nausea and vomitting reglan Zofran  Abnormal liver function Check acute hepatitis panel  Renal insufficiency Hydrate with ns iv  Hypertension Hydralazine 55m iv q6h prn sbp >160   DVT Prophylaxis  Lovenox, SCDs   AM Labs Ordered, also please review Full Orders  Family Communication: Admission, patients condition and plan of care including tests being ordered have been discussed with the patient  who indicate understanding and agree with the plan and Code Status.  Code Status FULL CODE  Likely DC to  home  Condition GUARDED    Consults called: Gastroenterology  Admission status: inpatient  Time spent in minutes : 45   JJani GravelM.D on 10/25/2017 at 9:33 PM  Between 7am to 7pm - Pager -  [phone] After 7pm go to [email or web address] - password [facility] - Office  [phone]

## 2017-11-03 NOTE — ED Notes (Signed)
Pt O2 sats dropped to 88%. Pt placed on O2 via nasal cannula. RT  And PAmade aware.

## 2017-11-03 NOTE — Progress Notes (Signed)
Emma Washington, Emma Washington Female, 57 y.o., April 04, 1961  H/o HTN, renal stone, [facility] , dysuria.  CT renal stone reviewed acute pancreatitis with associated gastritis and duodenitis . 1mm nonobstructing stone, no hydro.  Bedside us + gallstone, cbd 6.997mm.  lipase>10,000, tibili 4.4, ast 497, alt 601.  Difficult pain control, blood pressure dropped with morphin.  She is given ivf/zosyn.  [facility] consulted , gi recommended MRCP once arrives to [facility]. Please inform [facility] once patient gets here. Ua/uds/alcohol level pending Accepted to stepdown. Currently no bed. EDP aware, he is advised to contact other facility if [facility] not have [facility].  Please call flow manager once patient arrives to [facility].

## 2017-11-03 NOTE — Progress Notes (Signed)
Pt gives permission for her 2 friends and co-workers to answer the questions on nursing admission hx for her. Briscoe Burns. Carleane Chazz Philson BSN, RN-BC Admissions RN 11-18-17 8:44 PM

## 2017-11-03 NOTE — ED Triage Notes (Signed)
Iv access obtained while pt being triaged, pa at bedside for eval.

## 2017-11-03 NOTE — ED Notes (Signed)
US at bedside

## 2017-11-03 NOTE — ED Triage Notes (Signed)
Pt restless, moaning, to room 5 in w/c, able to stand and walk to bed, reports severe 10/10 left flank pain x last night, states she has a known renal stone in her left kidney and this feels like it is moving. Pt reports hematuria, vomiting "a lot" from the pain.

## 2017-11-04 ENCOUNTER — Inpatient Hospital Stay (HOSPITAL_COMMUNITY): Payer: BLUE CROSS/BLUE SHIELD

## 2017-11-04 DIAGNOSIS — K851 Biliary acute pancreatitis without necrosis or infection: Secondary | ICD-10-CM

## 2017-11-04 DIAGNOSIS — R0902 Hypoxemia: Secondary | ICD-10-CM

## 2017-11-04 DIAGNOSIS — K831 Obstruction of bile duct: Secondary | ICD-10-CM

## 2017-11-04 DIAGNOSIS — J9600 Acute respiratory failure, unspecified whether with hypoxia or hypercapnia: Secondary | ICD-10-CM

## 2017-11-04 LAB — COMPREHENSIVE METABOLIC PANEL
ALT: 480 U/L — ABNORMAL HIGH (ref 14–54)
ALT: 390 U/L — AB (ref 14–54)
ANION GAP: 9 (ref 5–15)
AST: 231 U/L — AB (ref 15–41)
AST: 164 U/L — ABNORMAL HIGH (ref 15–41)
Albumin: 3.5 g/dL (ref 3.5–5.0)
Albumin: 3 g/dL — ABNORMAL LOW (ref 3.5–5.0)
Alkaline Phosphatase: 118 U/L (ref 38–126)
Alkaline Phosphatase: 98 U/L (ref 38–126)
Anion gap: 12 (ref 5–15)
BUN: 27 mg/dL — AB (ref 6–20)
BUN: 37 mg/dL — ABNORMAL HIGH (ref 6–20)
CHLORIDE: 105 mmol/L (ref 101–111)
CHLORIDE: 110 mmol/L (ref 101–111)
CO2: 19 mmol/L — AB (ref 22–32)
CO2: 19 mmol/L — ABNORMAL LOW (ref 22–32)
Calcium: 7.3 mg/dL — ABNORMAL LOW (ref 8.9–10.3)
Calcium: 5.8 mg/dL — CL (ref 8.9–10.3)
Creatinine, Ser: 2.49 mg/dL — ABNORMAL HIGH (ref 0.44–1.00)
Creatinine, Ser: 2.72 mg/dL — ABNORMAL HIGH (ref 0.44–1.00)
GFR calc Af Amer: 24 mL/min — ABNORMAL LOW (ref >60)
GFR calc non Af Amer: 21 mL/min — ABNORMAL LOW (ref >60)
GFR calc non Af Amer: 19 mL/min — ABNORMAL LOW (ref >60)
GFR, EST AFRICAN AMERICAN: 22 mL/min — AB (ref >60)
Glucose, Bld: 293 mg/dL — ABNORMAL HIGH (ref 65–99)
Glucose, Bld: 254 mg/dL — ABNORMAL HIGH (ref 65–99)
POTASSIUM: 4.5 mmol/L (ref 3.5–5.1)
POTASSIUM: 4 mmol/L (ref 3.5–5.1)
SODIUM: 136 mmol/L (ref 135–145)
SODIUM: 138 mmol/L (ref 135–145)
Total Bilirubin: 5.8 mg/dL — ABNORMAL HIGH (ref 0.3–1.2)
Total Bilirubin: 6.4 mg/dL — ABNORMAL HIGH (ref 0.3–1.2)
Total Protein: 6.1 g/dL — ABNORMAL LOW (ref 6.5–8.1)
Total Protein: 5.5 g/dL — ABNORMAL LOW (ref 6.5–8.1)

## 2017-11-04 LAB — BASIC METABOLIC PANEL
Anion gap: 8 (ref 5–15)
BUN: 37 mg/dL — AB (ref 6–20)
CHLORIDE: 108 mmol/L (ref 101–111)
CO2: 20 mmol/L — AB (ref 22–32)
CREATININE: 2.49 mg/dL — AB (ref 0.44–1.00)
Calcium: 5.7 mg/dL — CL (ref 8.9–10.3)
GFR calc non Af Amer: 21 mL/min — ABNORMAL LOW (ref >60)
GFR, EST AFRICAN AMERICAN: 24 mL/min — AB (ref >60)
GLUCOSE: 225 mg/dL — AB (ref 65–99)
Potassium: 4.2 mmol/L (ref 3.5–5.1)
Sodium: 136 mmol/L (ref 135–145)

## 2017-11-04 LAB — LIPASE, BLOOD: Lipase: 1488 U/L — ABNORMAL HIGH (ref 11–51)

## 2017-11-04 LAB — CBC
HCT: 43.9 % (ref 36.0–46.0)
HEMATOCRIT: 46.5 % — AB (ref 36.0–46.0)
Hemoglobin: 16.1 g/dL — ABNORMAL HIGH (ref 12.0–15.0)
Hemoglobin: 14.9 g/dL (ref 12.0–15.0)
MCH: 30.6 pg (ref 26.0–34.0)
MCH: 30.3 pg (ref 26.0–34.0)
MCHC: 34.6 g/dL (ref 30.0–36.0)
MCHC: 33.9 g/dL (ref 30.0–36.0)
MCV: 88.2 fL (ref 78.0–100.0)
MCV: 89.4 fL (ref 78.0–100.0)
PLATELETS: 302 10*3/uL (ref 150–400)
PLATELETS: 248 10*3/uL (ref 150–400)
RBC: 5.27 MIL/uL — AB (ref 3.87–5.11)
RBC: 4.91 MIL/uL (ref 3.87–5.11)
RDW: 14.7 % (ref 11.5–15.5)
RDW: 14.9 % (ref 11.5–15.5)
WBC: 18 10*3/uL — ABNORMAL HIGH (ref 4.0–10.5)
WBC: 15.2 10*3/uL — ABNORMAL HIGH (ref 4.0–10.5)

## 2017-11-04 LAB — SURGICAL PCR SCREEN
MRSA, PCR: NEGATIVE
Staphylococcus aureus: NEGATIVE

## 2017-11-04 LAB — GLUCOSE, CAPILLARY
GLUCOSE-CAPILLARY: 193 mg/dL — AB (ref 65–99)
Glucose-Capillary: 207 mg/dL — ABNORMAL HIGH (ref 65–99)

## 2017-11-04 LAB — LIPID PANEL
Cholesterol: 191 mg/dL (ref 0–200)
HDL: 28 mg/dL — AB (ref >40)
LDL CALC: 120 mg/dL — AB (ref 0–99)
Total CHOL/HDL Ratio: 6.8 RATIO
Triglycerides: 213 mg/dL — ABNORMAL HIGH (ref <150)
VLDL: 43 mg/dL — AB (ref 0–40)

## 2017-11-04 LAB — MAGNESIUM: Magnesium: 1.3 mg/dL — ABNORMAL LOW (ref 1.7–2.4)

## 2017-11-04 LAB — HIV ANTIBODY (ROUTINE TESTING W REFLEX): HIV SCREEN 4TH GENERATION: NONREACTIVE

## 2017-11-04 MED ORDER — PIPERACILLIN-TAZOBACTAM 3.375 G IVPB
3.3750 g | Freq: Three times a day (TID) | INTRAVENOUS | Status: DC
  Administered 2017-11-04 – 2017-11-07 (×10): 3.375 g via INTRAVENOUS
  Filled 2017-11-04 (×9): qty 50

## 2017-11-04 MED ORDER — LACTATED RINGERS IV BOLUS (SEPSIS)
2000.0000 mL | Freq: Once | INTRAVENOUS | Status: AC
  Administered 2017-11-04: 2000 mL via INTRAVENOUS

## 2017-11-04 MED ORDER — INSULIN ASPART 100 UNIT/ML ~~LOC~~ SOLN
0.0000 [IU] | SUBCUTANEOUS | Status: DC
  Administered 2017-11-04 – 2017-11-05 (×9): 2 [IU] via SUBCUTANEOUS
  Administered 2017-11-06: 1 [IU] via SUBCUTANEOUS
  Administered 2017-11-06: 2 [IU] via SUBCUTANEOUS
  Administered 2017-11-06: 1 [IU] via SUBCUTANEOUS
  Administered 2017-11-06 – 2017-11-07 (×7): 2 [IU] via SUBCUTANEOUS
  Administered 2017-11-07: 1 [IU] via SUBCUTANEOUS
  Administered 2017-11-07 – 2017-11-08 (×3): 2 [IU] via SUBCUTANEOUS
  Administered 2017-11-08 – 2017-11-09 (×3): 1 [IU] via SUBCUTANEOUS
  Administered 2017-11-09: 2 [IU] via SUBCUTANEOUS
  Administered 2017-11-09 (×3): 1 [IU] via SUBCUTANEOUS
  Administered 2017-11-09: 2 [IU] via SUBCUTANEOUS
  Administered 2017-11-09 – 2017-11-10 (×7): 1 [IU] via SUBCUTANEOUS
  Administered 2017-11-11: 2 [IU] via SUBCUTANEOUS
  Administered 2017-11-11 – 2017-11-12 (×9): 1 [IU] via SUBCUTANEOUS
  Administered 2017-11-13 (×2): 2 [IU] via SUBCUTANEOUS
  Administered 2017-11-13 (×5): 1 [IU] via SUBCUTANEOUS
  Administered 2017-11-14 (×2): 2 [IU] via SUBCUTANEOUS

## 2017-11-04 MED ORDER — LACTATED RINGERS IV SOLN
INTRAVENOUS | Status: DC
  Administered 2017-11-04 – 2017-11-05 (×4): via INTRAVENOUS
  Administered 2017-11-05 (×2): 200 mL/h via INTRAVENOUS
  Administered 2017-11-05: 04:00:00 via INTRAVENOUS
  Administered 2017-11-05: 200 mL/h via INTRAVENOUS
  Administered 2017-11-11: 16:00:00 via INTRAVENOUS

## 2017-11-04 MED ORDER — PROMETHAZINE HCL 25 MG/ML IJ SOLN: 12.5000 mg | Freq: Four times a day (QID) | INTRAMUSCULAR | Status: DC | PRN

## 2017-11-04 MED ORDER — MORPHINE SULFATE (PF) 4 MG/ML IV SOLN
4.0000 mg | Freq: Once | INTRAVENOUS | Status: AC
  Administered 2017-11-04: 4 mg via INTRAVENOUS
  Filled 2017-11-04: qty 1

## 2017-11-04 MED ORDER — GADOBENATE DIMEGLUMINE 529 MG/ML IV SOLN
20.0000 mL | Freq: Once | INTRAVENOUS | Status: AC | PRN
  Administered 2017-11-04: 20 mL via INTRAVENOUS

## 2017-11-04 MED ORDER — PROMETHAZINE HCL 25 MG/ML IJ SOLN
6.2500 mg | Freq: Four times a day (QID) | INTRAMUSCULAR | Status: DC | PRN
  Administered 2017-11-04 – 2017-11-05 (×3): 6.25 mg via INTRAVENOUS
  Filled 2017-11-04 (×3): qty 1

## 2017-11-04 MED ORDER — MORPHINE SULFATE (PF) 4 MG/ML IV SOLN
4.0000 mg | INTRAVENOUS | Status: DC | PRN
  Administered 2017-11-04 – 2017-11-05 (×10): 4 mg via INTRAVENOUS
  Filled 2017-11-04 (×10): qty 1

## 2017-11-04 MED ORDER — GADOBENATE DIMEGLUMINE 529 MG/ML IV SOLN: 20.0000 mL | Freq: Once | INTRAVENOUS | Status: DC | PRN

## 2017-11-04 MED ORDER — SODIUM CHLORIDE 0.9 % IV SOLN
1.0000 g | Freq: Once | INTRAVENOUS | Status: AC
  Administered 2017-11-04: 1 g via INTRAVENOUS
  Filled 2017-11-04 (×2): qty 10

## 2017-11-04 MED ORDER — SODIUM CHLORIDE 0.9 % IV BOLUS (SEPSIS): 2000.0000 mL | Freq: Once | INTRAVENOUS | Status: DC

## 2017-11-04 MED ORDER — ORAL CARE MOUTH RINSE
15.0000 mL | Freq: Two times a day (BID) | OROMUCOSAL | Status: DC
  Administered 2017-11-04 – 2017-11-07 (×7): 15 mL via OROMUCOSAL

## 2017-11-04 MED ORDER — SODIUM CHLORIDE 0.9 % IV BOLUS (SEPSIS)
2000.0000 mL | Freq: Once | INTRAVENOUS | Status: AC
  Administered 2017-11-04: 2000 mL via INTRAVENOUS

## 2017-11-04 MED ORDER — MAGNESIUM SULFATE 4 GM/100ML IV SOLN
4.0000 g | Freq: Once | INTRAVENOUS | Status: AC
  Administered 2017-11-04: 4 g via INTRAVENOUS
  Filled 2017-11-04: qty 100

## 2017-11-04 MED ORDER — NALOXONE HCL 0.4 MG/ML IJ SOLN: 0.4000 mg | INTRAMUSCULAR | Status: DC | PRN

## 2017-11-04 MED ORDER — PANTOPRAZOLE SODIUM 40 MG IV SOLR
40.0000 mg | Freq: Two times a day (BID) | INTRAVENOUS | Status: DC
  Administered 2017-11-04 – 2017-12-05 (×63): 40 mg via INTRAVENOUS
  Filled 2017-11-04 (×62): qty 40

## 2017-11-04 MED ORDER — LACTATED RINGERS IV BOLUS (SEPSIS)
1000.0000 mL | Freq: Once | INTRAVENOUS | Status: AC
  Administered 2017-11-04: 1000 mL via INTRAVENOUS

## 2017-11-04 NOTE — Progress Notes (Signed)
Patient requested RN to call Georgena Spurlingrish [employer], patient's employer, and let her know about her circumstances.  RN was able to do so and informed patient.

## 2017-11-04 NOTE — Progress Notes (Signed)
Pharmacy Antibiotic Note  Emma Washington is a 57 y.o. female admitted on 11/10/2017 with acute pancreatitis with pancreatic edema and peripancreatic fluid.  Pharmacy has been consulted for Zosyn dosing.  Plan: Zosyn 3.375g IV Q8H infused over 4hrs.  Follow up renal fxn, culture results, and clinical course.   Height: 5\' 9"  (175.3 cm) Weight: 287 lb 4.2 oz (130.3 kg) IBW/kg (Calculated) : 66.2  Temp (24hrs), Avg:98.2 F (36.8 C), Min:97.8 F (36.6 C), Max:98.9 F (37.2 C)  Recent Labs  Lab 11/02/2017 1041 11/04/17 0254  WBC 11.5* 18.0*  CREATININE 1.33* 2.49*    Estimated Creatinine Clearance: 37 mL/min (A) (by C-G formula based on SCr of 2.49 mg/dL (H)).    Allergies  Allergen Reactions  . Demerol Other (See Comments)    Severe hypotension  . Meperidine     Other reaction(s): Hypotension, Hypotension (ALLERGY/intolerance)  . Other Anaphylaxis    Pineapple  . Pineapple Swelling and Anaphylaxis    Antimicrobials this admission: 1/15 Zosyn >>   Dose adjustments this admission:  Microbiology results: 1/14 MRSA PCR: negative  Thank you for allowing pharmacy to be a part of this patient's care.  Lynann Beaverhristine Jermiah Soderman PharmD, BCPS Pager [phone] 11/04/2017 11:00 AM

## 2017-11-04 NOTE — Progress Notes (Addendum)
PROGRESS NOTE  Emma Washington UJW:[id number] DOB: Feb 18, 1961 DOA: 11-09-17 PCP: No primary care provider on file.  HPI/Recap of past 24 hours:  Pain is 6/10 , she has sinus tachycardia, on 3liter oxygen  blood pressure stable, no fever  Assessment/Plan: Principal Problem:   Gallstone pancreatitis  Severe Gallstone pancreatitis: -Patient is transferred from [facility] ED to [place]. -mrcp : Cholelithiasis with borderline distention of the extrahepatic bile ducts. 3 mm filling defect in the common duct as it enters the head of the pancreas is consistent with with choledocholithiasis. 3. Nonenhancement involving much of the pancreatic body and tail parenchyma, highly suggestive of pancreatic necrosis. 4. Extensive edema/inflammation in the retroperitoneal space with intraperitoneal fluid and diffuse mesenteric edema in the abdomen. No focal/organized or rim enhancing fluid collection at this time to suggest evolving pseudocyst or abscess. 5. Hepatomegaly. 6. Portal vein is patent. -high ranson's score due to leukocytosis, drop of hemoglobin, worsening cr, hypocalcemia, elevated lft, elevated blood glucose, acidosis oxygen requirement, She has received 8liter of fluids bolus since admission. -report fentanyl did not help, dilaudid dropped her blood pressure, she is currently on iv morphin 4mg  prn seems helps. -on zosyn per GI -GI following, due to risk of deterioration, Critical care consulted.  Hypocalcemia: due to pancreatitis Cautious with supplement Will monitor Qtc  Hypomagnesemia: replace mag   Code Status: full  Family Communication: patient   Disposition Plan: remain in stepdown   Consultants:  GI  Critical care  Procedures:  mrcp  Antibiotics:  zosyn   Objective: BP 130/86   Pulse (!) 116   Temp 97.8 F (36.6 C) (Oral)   Resp (!) 28   Ht 5\' 9"  (1.753 m)   Wt 130.3 kg (287 lb 4.2 oz)   LMP 11/07/2011   SpO2 90%   BMI 42.42 kg/m    Intake/Output Summary (Last 24 hours) at 11/04/2017 0743 Last data filed at 11/04/2017 0325 Gross per 24 hour  Intake 2823.33 ml  Output 250 ml  Net 2573.33 ml   Filed Weights   11-09-2017 1037 09-Nov-2017 1839 11/04/17 0202  Weight: 113.4 kg (250 lb) 134.3 kg (296 lb 1.2 oz) 130.3 kg (287 lb 4.2 oz)    Exam: Patient is examined daily including today on 11/04/2017, exams remain the same as of yesterday except that has changed    General:  In pain  Cardiovascular: sinus tachycardia  Respiratory: diminished , no wheezing  Abdomen: obese, diffuse tender, guarding, no rebound, hypoactive BS  Musculoskeletal: No Edema  Neuro: alert, oriented   Data Reviewed: Basic Metabolic Panel: Recent Labs  Lab 11/09/17 1041 11/04/17 0254  NA 139 136  K 3.5 4.5  CL 102 105  CO2 24 19*  GLUCOSE 131* 293*  BUN 16 27*  CREATININE 1.33* 2.49*  CALCIUM 10.0 7.3*   Liver Function Tests: Recent Labs  Lab 11-09-2017 1041 11/04/17 0254  AST 497* 231*  ALT 601* 480*  ALKPHOS 159* 118  BILITOT 4.4* 5.8*  PROT 7.9 6.1*  ALBUMIN 4.4 3.5   Recent Labs  Lab 11/09/17 1041 11/04/17 0254  LIPASE >10,000* 1,488*   No results for input(s): AMMONIA in the last 168 hours. CBC: Recent Labs  Lab 2017-11-09 1041 11/04/17 0254  WBC 11.5* 18.0*  NEUTROABS 9.2*  --   HGB 16.2* 16.1*  HCT 46.0 46.5*  MCV 85.7 88.2  PLT 327 302   Cardiac Enzymes:   No results for input(s): CKTOTAL, CKMB, CKMBINDEX, TROPONINI in the last 168 hours. BNP (last  3 results) No results for input(s): BNP in the last 8760 hours.  ProBNP (last 3 results) No results for input(s): PROBNP in the last 8760 hours.  CBG: No results for input(s): GLUCAP in the last 168 hours.  Recent Results (from the past 240 hour(s))  MRSA PCR Screening     Status: None   Collection Time: 11/04/2017  6:37 PM  Result Value Ref Range Status   MRSA by PCR NEGATIVE NEGATIVE Final    Comment:        The GeneXpert MRSA Assay  (FDA approved for NASAL specimens only), is one component of a comprehensive MRSA colonization surveillance program. It is not intended to diagnose MRSA infection nor to guide or monitor treatment for MRSA infections.      Studies: [place] Abdomen Complete  Result Date: 11/10/2017 CLINICAL DATA:  Epigastric and right upper quadrant discomfort, syncope, vomiting, hematuria. EXAM: ABDOMEN ULTRASOUND COMPLETE COMPARISON:  Abdominal and pelvic CT scan of today's date FINDINGS: Gallbladder: Probable small stones. No gallbladder wall thickening or pericholecystic fluid or positive sonographic Murphy's sign. Common bile duct: Diameter: 6.7 mm Liver: Visualization of the hepatic parenchyma is limited by bowel poor penetration of the ultrasound beam. The echotexture is subjectively increased. There is no focal mass or ductal dilation. Portal vein is patent on color Doppler imaging with normal direction of blood flow towards the liver. IVC: No abnormality visualized. Pancreas: Visualization of the pancreas is limited due to bowel gas. Spleen: Size and appearance within normal limits. Right Kidney: Length: 13.2 cm. Echogenicity within normal limits. No mass or hydronephrosis visualized. Left Kidney: Length: 11.9 cm. Echogenicity within normal limits. No mass or hydronephrosis visualized. Abdominal aorta: Bowel gas limits evaluation of the abdominal aorta. Other findings: No significant ascites is observed. IMPRESSION: The study is limited overall due to bowel gas and the patient's body habitus. Multiple tiny gallstones are suspected. There is mild prominence of the common bile duct without definite intraluminal stones. Increased hepatic echotexture compatible with fatty infiltrative change. Poor evaluation of the pancreas due to bowel gas and surrounding inflammatory changes. Electronically Signed   By: David  SwazilandJordan M.D.   On: 10/22/2017 14:02   Ct Renal Stone Study  Result Date: 11/13/2017 CLINICAL DATA:   Left-sided flank pain and hematuria.  Vomiting. EXAM: CT ABDOMEN AND PELVIS WITHOUT CONTRAST TECHNIQUE: Multidetector CT imaging of the abdomen and pelvis was performed following the standard protocol without oral or intravenous contrast material administration. COMPARISON:  CT abdomen and pelvis April 09, 2008 FINDINGS: Lower chest: There is bibasilar atelectatic change. Hepatobiliary: There is hepatic steatosis. No focal liver lesions are evident on this noncontrast enhanced study. Gallbladder wall is not appreciably thickened. There is no biliary duct dilatation. Pancreas: There is edema throughout the pancreas with soft tissue stranding in the adjacent peripancreatic mesentery throughout the peripancreatic region. Fluid tracks to the right of the pancreatic head and inferior to the uncinate process as well as anterior to the pancreatic head. Fluid tracks from the body and tail the pancreas anteriorly to reach the greater curvature of the stomach in the midportion of the gastric body region. A well-defined pseudocyst type lesion is not seen. There is no pancreatic duct dilatation. Spleen: No splenic lesions are evident. Adrenals/Urinary Tract: Adrenals bilaterally appear normal. Kidneys bilaterally show no evident mass or hydronephrosis. There is a 1 mm calculus in the upper pole left kidney, nonobstructing. There is no evident ureteral calculus on either side. Urinary bladder is midline with wall thickness within normal  limits. Stomach/Bowel: There is mild wall thickening along the greater curvature of the stomach in the midbody region in along the second and third duodenum due to nearby pancreatitis. No other bowel wall thickening is evident. There is no appreciable bowel obstruction. No evident free air or portal venous air. Vascular/Lymphatic: There is no abdominal aortic aneurysm. No vascular lesions are appreciable on this study. No adenopathy is evident in the abdomen or pelvis. Reproductive: Uterus is  anteverted. There is an apparent leiomyoma extending from the leftward aspect of the uterus anteriorly measuring 4.3 x 4.0 cm. No extrauterine pelvic mass evident. Other: Appendix appears normal. No abscess is seen in the abdomen or pelvis. No free pelvic fluid seen. Loculated fluid adjacent to the pancreas is noted. Musculoskeletal: There is degenerative change in the lumbar spine. There are no blastic or lytic bone lesions. There is no intramuscular or abdominal wall lesion evident. IMPRESSION: 1. Acute pancreatitis with pancreatic edema and peripancreatic fluid, most notably anterior to the body and tail of the pancreas. No well-defined pseudocyst or noncystic mass. No pancreatic duct dilatation. 2. Areas of gastritis and duodenitis due to adjacent pancreatitis causing inflammation in these areas. No bowel obstruction. No abscess. Appendix appears normal. 3. 1 mm nonobstructing calculus upper pole left kidney. No hydronephrosis or ureteral calculus on either side. 4.  Hepatic steatosis. 5. 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 Electronically Signed   By: Bretta Bang III M.D.   On: November 21, 2017 12:04    Scheduled Meds: . enoxaparin (LOVENOX) injection  40 mg Subcutaneous Q24H  . mouth rinse  15 mL Mouth Rinse BID    Continuous Infusions: . sodium chloride 200 mL/hr at 11/04/17 0500     Time spent: 35 mins I have personally reviewed and interpreted on  11/04/2017 daily labs, tele strips, imagings as discussed above under date review session and assessment and plans.  I reviewed all nursing notes, pharmacy notes, consultant notes,  vitals, pertinent old records  I have discussed plan of care as described above with RN , patient  on 11/04/2017   Albertine Grates MD, PhD  [facility] Pager [phone]. If 7PM-7AM, please contact night-coverage at [email or web address], password [facility] 11/04/2017, 7:43 AM  LOS: 1 day

## 2017-11-04 NOTE — Care Management Note (Signed)
Case Management Note  Patient Details  Name: Emma Washington MRN: [id number] Date of Birth: 05/07/1961  Subjective/Objective:                  pancreasitis  Action/Plan: Date:  November 04, 2017 Chart reviewed for concurrent status and case management needs.  Will continue to follow patient progress.  Discharge Planning: following for needs.  None present at this time of review. Expected discharge date: November 07 2017 Marcelle SmilingRhonda Davis, BSN, [place]3, [place]   [phone]   Expected Discharge Date:  (unknown)               Expected Discharge Plan:  Home/Self Care  In-House Referral:     Discharge planning Services  CM Consult  Post Acute Care Choice:    Choice offered to:     DME Arranged:    DME Agency:     HH Arranged:    HH Agency:     Status of Service:  In process, will continue to follow  If discussed at Long Length of Stay Meetings, dates discussed:    Additional Comments:  Golda AcreDavis, Rhonda Lynn, RN 11/04/2017, 9:23 AM

## 2017-11-04 NOTE — Progress Notes (Signed)
CRITICAL VALUE ALERT  Critical Value:  Calcium 5.8 (decreased from 7.3 which was last drawn at 0254)  Date & Time Notied:  11/04/2017 @1328    Provider Notified: Dr. Nonda LouXu, Jessica PA from GI made aware on the phone as well  Orders Received/Actions taken: New orders received for LR 2,000 bolus by PA from GI. Awaiting orders from Dr. Roda ShuttersXu

## 2017-11-04 NOTE — Progress Notes (Signed)
CRITICAL VALUE ALERT  Critical Value:  Calcium 5.7  Date & Time Notied:  11/04/2017 1740  Provider Notified: Dr. Roda ShuttersXu   Orders Received/Actions taken: New orders received for IV Calcium Gluconate

## 2017-11-04 NOTE — Progress Notes (Signed)
Spoke with GI PA, Doug SouJessica Zehr, on the phone regarding patient's status.  Patient has only had 175cc of tea colored urine since 0700 and last recorded output was 250cc at 0325.  PA also made aware of patient's critical Calcium of 5.8 as well as patient's primary MD.  New orders received for LR 2,000 bolus and maintenance fluids at 200cc after bolus.  Will continue to closely monitor patient.

## 2017-11-04 NOTE — Progress Notes (Signed)
Patient requested for me to call Dennie BiblePat, her fellow hospice RN, to come in and be with her. Called and Dennie Bibleat stated that she would come in to see her before she had to be at work.

## 2017-11-04 NOTE — Progress Notes (Signed)
Notified K. Schorr that MRI tech stated that MRI would not be read until about 7:30am. Continue to monitor.

## 2017-11-04 NOTE — Consult Note (Signed)
PULMONARY / CRITICAL CARE MEDICINE   Name: Emma Washington MRN: [id number] DOB: [birth year]    ADMISSION DATE:  05-23-2018 CONSULTATION DATE:  11/04/2017  REFERRING MD:  Dr. 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 Other associated symptoms include nausea, vomiting and decreased PO intake (since 1/13). Pertinent PMHx significant for nephrolithiasis.   Initial labs show lipase >10,000, mildly elevated WBC of 11.5, creatinine 1.33, AST of 497 and ALT of 601. UA positive for ketones, protein, mucus and rare bacteria. UDS positive for opiates. CT renal stone study showed evidence of acute pancreatitis with pancreatic edema and peripancreatic fluid anteriorly to the body and tail of the pancreas as well as a 1 mm non obstructing stone in the upper pole of the left kidney.  Patient was admitted and GI consulted. GI suggested MRCP to be done today which shows cholelithiasis, possible choledocolithiasis, possible pancreatic necrosis of the body and tail, edema/inflammation in the retroperitoneal space and hepatomegaly. CXR today indicate low lung volumes and possible bilateral atelectasis vs PNA.  Patient was tachypnic with concern for decompensation which prompted a critical care consult. She is currently on protonix, zosyn, morphine, fluids, phenergan, reglan and zofran.    PAST MEDICAL HISTORY :  She  has a past medical history of Anemia, GERD (gastroesophageal reflux disease), Hypertension, and Nephrolithiasis.  PAST SURGICAL HISTORY: She  has a past surgical history that includes Hand surgery and Hernia repair.  Allergies  Allergen Reactions  . Demerol Other (See Comments)    Severe hypotension  . Meperidine     Other reaction(s): Hypotension, Hypotension (ALLERGY/intolerance)  . Other Anaphylaxis    Pineapple  . Pineapple Swelling and  Anaphylaxis    No current facility-administered medications on file prior to encounter.    Current Outpatient Medications on File Prior to Encounter  Medication Sig  . ibuprofen (ADVIL,MOTRIN) 200 MG tablet Take 1,000 mg by mouth once as needed. For pain  . lisinopril-hydrochlorothiazide (PRINZIDE,ZESTORETIC) 20-25 MG tablet Take 1 tablet by mouth daily as needed (hypertension).   . promethazine (PHENERGAN) 25 MG tablet Take 25 mg by mouth every 6 (six) hours as needed for nausea or vomiting.    FAMILY HISTORY:  Her indicated that her brother is alive.   SOCIAL HISTORY: She  reports that  has never smoked. she has never used smokeless tobacco. She reports that she does not drink alcohol or use drugs.  REVIEW OF SYSTEMS: POSITIVES IN BOLD  Gen: Denies fever, chills, weight change, fatigue, night sweats HEENT: Denies blurred vision, double vision, hearing loss, tinnitus, sinus congestion, rhinorrhea, sore throat, neck stiffness, dysphagia PULM: Denies shortness of breath, cough, sputum production, hemoptysis, wheezing CV: Denies chest pain, edema, orthopnea, paroxysmal nocturnal dyspnea, palpitations GI: Denies abdominal pain, nausea, vomiting, diarrhea, hematochezia, melena, constipation, change in bowel habits GU: Denies dysuria, hematuria, polyuria, oliguria, urethral discharge Endocrine: Denies hot or cold intolerance, polyuria, polyphagia or appetite change Derm: Denies rash, dry skin, scaling or peeling skin change Heme: Denies easy bruising, bleeding, bleeding gums Neuro: Denies headache, numbness, weakness, slurred speech, loss of memory or consciousness   SUBJECTIVE:    VITAL SIGNS: BP (!) 109/54   Pulse (!) 117   Temp 97.8 F (36.6 C) (Oral)   Resp (!) 25   Ht 5\' 9"  (1.753 m)   Wt 287 lb 4.2 oz (130.3 kg)  LMP 11/07/2011   SpO2 92%   BMI 42.42 kg/m   HEMODYNAMICS:    VENTILATOR SETTINGS:    INTAKE / OUTPUT: I/O last 3 completed shifts: In: 2823.3  [I.V.:773.3; IV Piggyback:2050] Out: 250 [Urine:250]  PHYSICAL EXAMINATION: General: adult female in NAD, lying in bed, appears ill   HEENT: MM pink/dry Neuro: AAOx4, speech clear, MAE, periods of drowsiness post morphine  CV: s1s2 rrr, ST, no m/r/g PULM: even/non-labored, lungs bilaterally diminished  BJ:YNWG, non-tender, bsx4 active  Extremities: warm/dry, no edema  Skin: no rashes or lesions  LABS:  BMET Recent Labs  Lab 10/22/2017 1041 11/04/17 0254 11/04/17 1144  NA 139 136 138  K 3.5 4.5 4.0  CL 102 105 110  CO2 24 19* 19*  BUN 16 27* 37*  CREATININE 1.33* 2.49* 2.72*  GLUCOSE 131* 293* 254*    Electrolytes Recent Labs  Lab 11/02/2017 1041 11/04/17 0254 11/04/17 1144  CALCIUM 10.0 7.3* 5.8*  MG  --   --  1.3*    CBC Recent Labs  Lab 11/15/2017 1041 11/04/17 0254 11/04/17 1144  WBC 11.5* 18.0* 15.2*  HGB 16.2* 16.1* 14.9  HCT 46.0 46.5* 43.9  PLT 327 302 248    Coag's No results for input(s): APTT, INR in the last 168 hours.  Sepsis Markers No results for input(s): LATICACIDVEN, PROCALCITON, O2SATVEN in the last 168 hours.  ABG No results for input(s): PHART, PCO2ART, PO2ART in the last 168 hours.  Liver Enzymes Recent Labs  Lab 11/15/2017 1041 11/04/17 0254 11/04/17 1144  AST 497* 231* 164*  ALT 601* 480* 390*  ALKPHOS 159* 118 98  BILITOT 4.4* 5.8* 6.4*  ALBUMIN 4.4 3.5 3.0*    Cardiac Enzymes No results for input(s): TROPONINI, PROBNP in the last 168 hours.  Glucose No results for input(s): GLUCAP in the last 168 hours.  Imaging Mr 3d Recon At Scanner  Result Date: 11/04/2017 CLINICAL DATA:  Pancreatitis with gallstones. EXAM: MRI ABDOMEN WITHOUT AND WITH CONTRAST (INCLUDING MRCP) TECHNIQUE: Multiplanar multisequence MR imaging of the abdomen was performed both before and after the administration of intravenous contrast. Heavily T2-weighted images of the biliary and pancreatic ducts were obtained, and three-dimensional MRCP images  were rendered by post processing. CONTRAST:  20 cc MultiHance COMPARISON:  CT scan 11/03/2016 FINDINGS: Markedly motion degraded study. Lower chest: The heart is enlarged without substantial pericardial effusion. Hepatobiliary: Liver measures 20.8 cm and craniocaudal length, enlarged. Multiple gallstones are identified, measuring up to 7 mm diameter. Some stones or trapped behind a fold in the gallbladder fundus. MRCP images are markedly motion degraded and nondiagnostic for tiny common duct stones. Axial T2 weighted single shot imaging shows common duct measuring 7 mm diameter. Common bile duct in the head of the pancreas also measures 7 mm diameter. The axial T2 weighted sequences reveal some central flow artifact in the common duct although a very tiny dependent signal void is seen in the common bile duct on axial image 34 series 5 that maps to a tiny filling defect in the common bile duct seen on coronal MRCP image 34 of series 6. This appearance is compatible with a tiny common bile duct stone. Pancreas: . pancreas is diffusely edematous with peripancreatic edema tracking diffusely in the anterior pararenal space. Postcontrast imaging shows enhancement in the head of pancreas extreme pancreatic tail but most of the body and tail of pancreas do not enhance. No focal or rim enhancing fluid collection identified in the retroperitoneal space. Spleen:  No splenomegaly. No  focal mass lesion. Adrenals/Urinary Tract: No adrenal nodule or mass. Limited assessment of the kidney shows no hydronephrosis or gross mass lesion. Stomach/Bowel: Stomach is nondistended. No gastric wall thickening. No evidence of outlet obstruction. Duodenum is normally positioned as is the ligament of Treitz. No small bowel or colonic dilatation within the visualized abdomen. Vascular/Lymphatic: Portal vein and superior mesenteric vein are patent. Splenic vein is patent. Celiac axis and SMA are patent. Small retroperitoneal / peripancreatic  lymph nodes noted. Other: Free fluid is noted around the liver and spleen. Mesenteric edema/fluid is evident. Musculoskeletal: No abnormal marrow enhancement within the visualized bony anatomy. IMPRESSION: 1. Markedly motion degraded study. 2. Cholelithiasis with borderline distention of the extrahepatic bile ducts. 3 mm filling defect in the common duct as it enters the head of the pancreas is consistent with with choledocholithiasis. 3. Nonenhancement involving much of the pancreatic body and tail parenchyma, highly suggestive of pancreatic necrosis. 4. Extensive edema/inflammation in the retroperitoneal space with intraperitoneal fluid and diffuse mesenteric edema in the abdomen. No focal/organized or rim enhancing fluid collection at this time to suggest evolving pseudocyst or abscess. 5. Hepatomegaly. 6. Portal vein is patent. Electronically Signed   By: Kennith Center M.D.   On: 11/04/2017 08:12   Mr Abdomen Mrcp Vivien Rossetti Contast  Result Date: 11/04/2017 CLINICAL DATA:  Pancreatitis with gallstones. EXAM: MRI ABDOMEN WITHOUT AND WITH CONTRAST (INCLUDING MRCP) TECHNIQUE: Multiplanar multisequence MR imaging of the abdomen was performed both before and after the administration of intravenous contrast. Heavily T2-weighted images of the biliary and pancreatic ducts were obtained, and three-dimensional MRCP images were rendered by post processing. CONTRAST:  20 cc MultiHance COMPARISON:  CT scan 11/03/2016 FINDINGS: Markedly motion degraded study. Lower chest: The heart is enlarged without substantial pericardial effusion. Hepatobiliary: Liver measures 20.8 cm and craniocaudal length, enlarged. Multiple gallstones are identified, measuring up to 7 mm diameter. Some stones or trapped behind a fold in the gallbladder fundus. MRCP images are markedly motion degraded and nondiagnostic for tiny common duct stones. Axial T2 weighted single shot imaging shows common duct measuring 7 mm diameter. Common bile duct in the  head of the pancreas also measures 7 mm diameter. The axial T2 weighted sequences reveal some central flow artifact in the common duct although a very tiny dependent signal void is seen in the common bile duct on axial image 34 series 5 that maps to a tiny filling defect in the common bile duct seen on coronal MRCP image 34 of series 6. This appearance is compatible with a tiny common bile duct stone. Pancreas: . pancreas is diffusely edematous with peripancreatic edema tracking diffusely in the anterior pararenal space. Postcontrast imaging shows enhancement in the head of pancreas extreme pancreatic tail but most of the body and tail of pancreas do not enhance. No focal or rim enhancing fluid collection identified in the retroperitoneal space. Spleen:  No splenomegaly. No focal mass lesion. Adrenals/Urinary Tract: No adrenal nodule or mass. Limited assessment of the kidney shows no hydronephrosis or gross mass lesion. Stomach/Bowel: Stomach is nondistended. No gastric wall thickening. No evidence of outlet obstruction. Duodenum is normally positioned as is the ligament of Treitz. No small bowel or colonic dilatation within the visualized abdomen. Vascular/Lymphatic: Portal vein and superior mesenteric vein are patent. Splenic vein is patent. Celiac axis and SMA are patent. Small retroperitoneal / peripancreatic lymph nodes noted. Other: Free fluid is noted around the liver and spleen. Mesenteric edema/fluid is evident. Musculoskeletal: No abnormal marrow enhancement within the  visualized bony anatomy. IMPRESSION: 1. Markedly motion degraded study. 2. Cholelithiasis with borderline distention of the extrahepatic bile ducts. 3 mm filling defect in the common duct as it enters the head of the pancreas is consistent with with choledocholithiasis. 3. Nonenhancement involving much of the pancreatic body and tail parenchyma, highly suggestive of pancreatic necrosis. 4. Extensive edema/inflammation in the retroperitoneal  space with intraperitoneal fluid and diffuse mesenteric edema in the abdomen. No focal/organized or rim enhancing fluid collection at this time to suggest evolving pseudocyst or abscess. 5. Hepatomegaly. 6. Portal vein is patent. Electronically Signed   By: Kennith Center M.D.   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   MR abdomen/MRCP 1/15 >> cholelithiasis, possible choledocolithiasis, possible pancreatic necrosis of the body and tail, edema/inflammation in the retroperitoneal space and hepatomegaly  CXR 1/15 >> Atelectasis vs PNA L>R; low lung volumes, mild cardiopericadrial silhouette enlargement.  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    ASSESSMENT / PLAN:  PULMONARY A: At Risk Respiratory Failure - in setting of volume resuscitation with pancreatitis, pain with splinting  Tachypnea  P:   Pulmonary hygiene - IS Monitor for impending failure   CARDIOVASCULAR A:  Tachycardia   SIRS  P:  SDU monitoring   RENAL A:   AKI - in setting of volume depletion (N/V, poor intake), pancreatitis  P:   LR @ 200 ml/hr  Additional 1L LR bolus over 2 hours now Trend BMP / urinary output Replace electrolytes as indicated Avoid nephrotoxic agents, ensure adequate renal perfusion  GASTROINTESTINAL A:   Acute Moderately Severe Pancreatitis - concern for  possible necrosis on MRCP  Gallstones Abdominal Pain  Nausea / vomiting P:   GI following, appreciate input  NPO x sips with meds Anticipate ERCP, defer to GI  Will need CCS evaluation for cholecystectomy once stabilized   HEMATOLOGIC A:   Hx Anemia  P:  Trend CBC  Enoxaparin for DVT prophylaxis   INFECTIOUS A:   Gallstone Pancreatitis - triglycerides 213 P:   Monitor fever curve / WBC trend  ABX as above / per primary   ENDOCRINE A:   Hyperglycemia - in setting of pancreatitis   P:   SSI Q4  Monitor for hypoglycemia   NEUROLOGIC A:   Pain - secondary to gallstone pancreatitis  P:   RASS goal: n/a  PRN morphine for pain    FAMILY  - Updates: Patient updated on plan of care 1/15.     Willa Frater, PA-S  [name], NP-C [facility] Pgr: [phone] or if no answer [phone] 11/04/2017, 4:13 PM  Attending Note:  I have examined patient, reviewed labs, studies and notes. I have discussed the case with B Ollis, and I agree with the data and plans as amended above.    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  She was admitted  1/14 with significant left flank pain, hematuria and dysuria.  Also with nausea vomiting and anorexia.  Her lab work revealed possible urinary tract infection on urinalysis.  Also showed profoundly elevated lipase, transaminitis and some evidence for acute renal insufficiency.  She underwent a CT scan of the abdomen to evaluate for renal stones.  This showed evidence for acute pancreatitis with surrounding pancreatic edema and peripancreatic fluid involving the body and tail of the pancreas.  Also noted was a 1 mm nonobstructing stone at the upper pole of the left kidney.  An MRCP on 1/15 showed cholelithiasis and suspected choledocholithiasis with possible pancreatic necrosis at the body and tail, surrounding edema and inflammation in the retroperitoneal space and hepatomegaly.   Chest x-ray shows low lung volumes and some bilateral effusions.  She has developed progressive tachypnea and moves now to the ICU/stepdown.  Vitals:   11/04/17 1435 11/04/17 1500  BP: (!) 144/89 (!) 145/44  Pulse: (!) 117 (!) 116  Resp: (!) 33 (!) 24  Temp:    SpO2: 95% 93%  On my evaluation she is an uncomfortable obese woman.  She is awake, can interact, answers all questions and moves all extremities.  Her lungs are significant for decreased basilar breath sounds and small lung volumes.  Abdomen is somewhat tender to palpation but minimally so.  No rebound, no guarding.  Heart is regular borderline tachycardic without a murmur.  Extremities have no significant edema.  Patient has moderately severe pancreatitis with some concern for possible necrosis.  She is at high risk for hemodynamic instability, respiratory failure.  Agree with aggressive volume resuscitation.  She is on Zosyn.  Suspect that she will be scheduled for an ERCP, gastroenterology is following.  She has associated acute renal failure.  We will follow her serum creatinine and electrolytes with volume resuscitation.  Continue morphine for pain.  If she decompensates then aggressive volume resuscitation and pressors will be indicated.  She is also at high risk for an acute lung injury.  We discussed this with her today and she would be okay with short-term intubation and mechanical ventilation should it become necessary to get through this acute event.   Levy Pupa, MD, PhD 11/04/2017, 4:30 PM [facility] [phone] or if no answer [phone]

## 2017-11-04 NOTE — Consult Note (Signed)
Referring Provider: Dr. Jani Gravel Primary Care Physician:  No primary care provider on file. Primary Gastroenterologist:  Althia Forts, Dr. Lawanna Kobus in [place]  Reason for Consultation:  CBD stone, gallstone pancreatitis  HPI: Emma Washington is a 57 y.o. female with PMH of Jerrye Bushy, nephrolithiasis.  Reports epigastric pain since 4 pm on 1/13.  Pt had subsequent n/v, without any bloody emesis.  Pt reported sharp pain, severe, fairly constant and then noted some ? Blood in urine.  Her urine was dark and she thought might have kidney stone and presented to [place] ER for evaluation.   In, ED Na 139, K 3.5,  Glucose 131, Bun 16, Creatinine 1.33  Ast 497, Alt 601, Alk phos 159, T. Bili 4.4 Lipase >10,000  CT scan abd/ pelvis IMPRESSION: 1. Acute pancreatitis with pancreatic edema and peripancreatic fluid, most notably anterior to the body and tail of the pancreas. No well-defined pseudocyst or noncystic mass. No pancreatic duct dilatation.  2. Areas of gastritis and duodenitis due to adjacent pancreatitis causing inflammation in these areas. No bowel obstruction. No abscess. Appendix appears normal.  3. 1 mm nonobstructing calculus upper pole left kidney. No hydronephrosis or ureteral calculus on either side.  4. Hepatic steatosis.  5. 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  Patient was transferred to [facility] ED.  GI was called and recommended MRCP, which showed the following:  IMPRESSION: 1. Markedly motion degraded study. 2. Cholelithiasis with borderline distention of the extrahepatic bile ducts. 3 mm filling defect in the common duct as it enters the head of the pancreas is consistent with with choledocholithiasis. 3. Nonenhancement involving much of the pancreatic body and tail parenchyma, highly suggestive of pancreatic necrosis. 4. Extensive edema/inflammation in the retroperitoneal space with  intraperitoneal fluid and diffuse mesenteric edema in the abdomen. No focal/organized or rim enhancing fluid collection at this time to suggest evolving pseudocyst or abscess. 5. Hepatomegaly. 6. Portal vein is patent.  She was given one dose of Zosyn in the ED.  Fluids only going at 100 mL/hr, but she did get some bolus in the ED as well.  This AM her urine output is minimal.  She is tachy.  Hgb still 16 grams, which is the same as it was on admission.  BUN and Cr rising.  Past Medical History:  Diagnosis Date  . Anemia   . GERD (gastroesophageal reflux disease)   . Hypertension   . Nephrolithiasis     Past Surgical History:  Procedure Laterality Date  . HAND SURGERY    . HERNIA REPAIR      Prior to Admission medications   Medication Sig Start Date End Date Taking? Authorizing Provider  ibuprofen (ADVIL,MOTRIN) 200 MG tablet Take 1,000 mg by mouth once as needed. For pain   Yes [provider]  lisinopril-hydrochlorothiazide (PRINZIDE,ZESTORETIC) 20-25 MG tablet Take 1 tablet by mouth daily as needed (hypertension).    Yes [provider]  promethazine (PHENERGAN) 25 MG tablet Take 25 mg by mouth every 6 (six) hours as needed for nausea or vomiting.   Yes [provider]    Current Facility-Administered Medications  Medication Dose Route Frequency Provider Last Rate Last Dose  . 0.9 %  sodium chloride infusion   Intravenous Continuous Loralie Champagne, PA-C 100 mL/hr at 11/04/17 0846    . enoxaparin (LOVENOX) injection 40 mg  40 mg Subcutaneous Q24H Jani Gravel, MD   40  mg at 11/05/2017 2206  . gadobenate dimeglumine (MULTIHANCE) injection 20 mL  20 mL Intravenous Once PRN Jani Gravel, MD      . hydrALAZINE (APRESOLINE) injection 10 mg  10 mg Intravenous Q6H PRN Jani Gravel, MD      . MEDLINE mouth rinse  15 mL Mouth Rinse BID Jani Gravel, MD      . metoCLOPramide [facility]) injection 5 mg  5 mg Intravenous Q6H PRN Jani Gravel, MD   5 mg at 11/04/17 0316  .  morphine 4 MG/ML injection 2 mg  2 mg Intravenous Q2H PRN Jani Gravel, MD   2 mg at 11/04/17 0702  . ondansetron (ZOFRAN) injection 4 mg  4 mg Intravenous Q6H PRN Jani Gravel, MD      . sodium chloride 0.9 % bolus 2,000 mL  2,000 mL Intravenous Once Zehr, Jessica D, PA-C        Allergies as of 11/16/2017 - Review Complete 10/26/2017  Allergen Reaction Noted  . Demerol Other (See Comments) 11/24/2011  . Meperidine  06/17/2013  . Other Anaphylaxis 08/13/2013  . Pineapple Swelling and Anaphylaxis 11/24/2011    Family History  Problem Relation Age of Onset  . Pancreatitis Brother     Social History   Socioeconomic History  . Marital status: Single    Spouse name: Not on file  . Number of children: Not on file  . Years of education: Not on file  . Highest education level: Not on file  Social Needs  . Financial resource strain: Not on file  . Food insecurity - worry: Not on file  . Food insecurity - inability: Not on file  . Transportation needs - medical: Not on file  . Transportation needs - non-medical: Not on file  Occupational History  . Not on file  Tobacco Use  . Smoking status: Never Smoker  . Smokeless tobacco: Never Used  Substance and Sexual Activity  . Alcohol use: No  . Drug use: No  . Sexual activity: No    Birth control/protection: None  Other Topics Concern  . Not on file  Social History Narrative  . Not on file    Review of Systems: ROS is O/W negative except as mentioned in HPI.  Physical Exam: Vital signs in last 24 hours: Temp:  [97.8 F (36.6 C)-98.9 F (37.2 C)] 97.8 F (36.6 C) (01/15 0350) Pulse Rate:  [63-124] 121 (01/15 0800) Resp:  [13-37] 31 (01/15 0800) BP: (76-184)/(37-114) 126/93 (01/15 0800) SpO2:  [89 %-98 %] 92 % (01/15 0800) Weight:  [250 lb (113.4 kg)-296 lb 1.2 oz (134.3 kg)] 287 lb 4.2 oz (130.3 kg) (01/15 0202) Last BM Date: 11/02/17 General:  Alert, Well-developed, well-nourished, very uncomfortable. Head:  Normocephalic  and atraumatic. Eyes:  Scleral icterus noted. Ears:  Normal auditory acuity. Mouth:  No deformity or lesions.   Lungs:  Clear throughout to auscultation.  No wheezes, crackles, or rhonchi.  Heart:  Tachy.  No M/R/G. Abdomen:  Soft, slightly distended.  BS very quiet and sparse.  Moderate to severe epigastric TTP. Rectal:  Deferred  Msk:  Symmetrical without gross deformities. Pulses:  Normal pulses noted. Extremities:  Without clubbing or edema. Neurologic:  Alert and oriented x 4;  grossly normal neurologically. Skin:  Intact without significant lesions or rashes. Psych:  Alert and cooperative. Normal mood and affect.  Intake/Output from previous day: 01/14 0701 - 01/15 0700 In: 2823.3 [I.V.:773.3; IV Piggyback:2050] Out: 250 [Urine:250]  Lab Results: Recent Labs    10/27/2017  1041 11/04/17 0254  WBC 11.5* 18.0*  HGB 16.2* 16.1*  HCT 46.0 46.5*  PLT 327 302   BMET Recent Labs    11/12/2017 1041 11/04/17 0254  NA 139 136  K 3.5 4.5  CL 102 105  CO2 24 19*  GLUCOSE 131* 293*  BUN 16 27*  CREATININE 1.33* 2.49*  CALCIUM 10.0 7.3*   LFT Recent Labs    11/04/17 0254  PROT 6.1*  ALBUMIN 3.5  AST 231*  ALT 480*  ALKPHOS 118  BILITOT 5.8*   Studies/Results: US Abdomen Complete  Result Date: 11/02/2017 CLINICAL DATA:  Epigastric and right upper quadrant discomfort, syncope, vomiting, hematuria. EXAM: ABDOMEN ULTRASOUND COMPLETE COMPARISON:  Abdominal and pelvic CT scan of today's date FINDINGS: Gallbladder: Probable small stones. No gallbladder wall thickening or pericholecystic fluid or positive sonographic Murphy's sign. Common bile duct: Diameter: 6.7 mm Liver: Visualization of the hepatic parenchyma is limited by bowel poor penetration of the ultrasound beam. The echotexture is subjectively increased. There is no focal mass or ductal dilation. Portal vein is patent on color Doppler imaging with normal direction of blood flow towards the liver. IVC: No abnormality  visualized. Pancreas: Visualization of the pancreas is limited due to bowel gas. Spleen: Size and appearance within normal limits. Right Kidney: Length: 13.2 cm. Echogenicity within normal limits. No mass or hydronephrosis visualized. Left Kidney: Length: 11.9 cm. Echogenicity within normal limits. No mass or hydronephrosis visualized. Abdominal aorta: Bowel gas limits evaluation of the abdominal aorta. Other findings: No significant ascites is observed. IMPRESSION: The study is limited overall due to bowel gas and the patient's body habitus. Multiple tiny gallstones are suspected. There is mild prominence of the common bile duct without definite intraluminal stones. Increased hepatic echotexture compatible with fatty infiltrative change. Poor evaluation of the pancreas due to bowel gas and surrounding inflammatory changes. Electronically Signed   By: David  Martinique M.D.   On: 11/18/2017 14:02   Mr 3d Recon At Scanner  Result Date: 11/04/2017 CLINICAL DATA:  Pancreatitis with gallstones. EXAM: MRI ABDOMEN WITHOUT AND WITH CONTRAST (INCLUDING MRCP) TECHNIQUE: Multiplanar multisequence MR imaging of the abdomen was performed both before and after the administration of intravenous contrast. Heavily T2-weighted images of the biliary and pancreatic ducts were obtained, and three-dimensional MRCP images were rendered by post processing. CONTRAST:  20 cc MultiHance COMPARISON:  CT scan 11/03/2016 FINDINGS: Markedly motion degraded study. Lower chest: The heart is enlarged without substantial pericardial effusion. Hepatobiliary: Liver measures 20.8 cm and craniocaudal length, enlarged. Multiple gallstones are identified, measuring up to 7 mm diameter. Some stones or trapped behind a fold in the gallbladder fundus. MRCP images are markedly motion degraded and nondiagnostic for tiny common duct stones. Axial T2 weighted single shot imaging shows common duct measuring 7 mm diameter. Common bile duct in the head of the  pancreas also measures 7 mm diameter. The axial T2 weighted sequences reveal some central flow artifact in the common duct although a very tiny dependent signal void is seen in the common bile duct on axial image 34 series 5 that maps to a tiny filling defect in the common bile duct seen on coronal MRCP image 34 of series 6. This appearance is compatible with a tiny common bile duct stone. Pancreas: . pancreas is diffusely edematous with peripancreatic edema tracking diffusely in the anterior pararenal space. Postcontrast imaging shows enhancement in the head of pancreas extreme pancreatic tail but most of the body and tail of pancreas do not enhance. No  focal or rim enhancing fluid collection identified in the retroperitoneal space. Spleen:  No splenomegaly. No focal mass lesion. Adrenals/Urinary Tract: No adrenal nodule or mass. Limited assessment of the kidney shows no hydronephrosis or gross mass lesion. Stomach/Bowel: Stomach is nondistended. No gastric wall thickening. No evidence of outlet obstruction. Duodenum is normally positioned as is the ligament of Treitz. No small bowel or colonic dilatation within the visualized abdomen. Vascular/Lymphatic: Portal vein and superior mesenteric vein are patent. Splenic vein is patent. Celiac axis and SMA are patent. Small retroperitoneal / peripancreatic lymph nodes noted. Other: Free fluid is noted around the liver and spleen. Mesenteric edema/fluid is evident. Musculoskeletal: No abnormal marrow enhancement within the visualized bony anatomy. IMPRESSION: 1. Markedly motion degraded study. 2. Cholelithiasis with borderline distention of the extrahepatic bile ducts. 3 mm filling defect in the common duct as it enters the head of the pancreas is consistent with with choledocholithiasis. 3. Nonenhancement involving much of the pancreatic body and tail parenchyma, highly suggestive of pancreatic necrosis. 4. Extensive edema/inflammation in the retroperitoneal space with  intraperitoneal fluid and diffuse mesenteric edema in the abdomen. No focal/organized or rim enhancing fluid collection at this time to suggest evolving pseudocyst or abscess. 5. Hepatomegaly. 6. Portal vein is patent. Electronically Signed   By: Misty Stanley M.D.   On: 11/04/2017 08:12   Mr Abdomen Mrcp Moise Boring Contast  Result Date: 11/04/2017 CLINICAL DATA:  Pancreatitis with gallstones. EXAM: MRI ABDOMEN WITHOUT AND WITH CONTRAST (INCLUDING MRCP) TECHNIQUE: Multiplanar multisequence MR imaging of the abdomen was performed both before and after the administration of intravenous contrast. Heavily T2-weighted images of the biliary and pancreatic ducts were obtained, and three-dimensional MRCP images were rendered by post processing. CONTRAST:  20 cc MultiHance COMPARISON:  CT scan 11/03/2016 FINDINGS: Markedly motion degraded study. Lower chest: The heart is enlarged without substantial pericardial effusion. Hepatobiliary: Liver measures 20.8 cm and craniocaudal length, enlarged. Multiple gallstones are identified, measuring up to 7 mm diameter. Some stones or trapped behind a fold in the gallbladder fundus. MRCP images are markedly motion degraded and nondiagnostic for tiny common duct stones. Axial T2 weighted single shot imaging shows common duct measuring 7 mm diameter. Common bile duct in the head of the pancreas also measures 7 mm diameter. The axial T2 weighted sequences reveal some central flow artifact in the common duct although a very tiny dependent signal void is seen in the common bile duct on axial image 34 series 5 that maps to a tiny filling defect in the common bile duct seen on coronal MRCP image 34 of series 6. This appearance is compatible with a tiny common bile duct stone. Pancreas: . pancreas is diffusely edematous with peripancreatic edema tracking diffusely in the anterior pararenal space. Postcontrast imaging shows enhancement in the head of pancreas extreme pancreatic tail but most of  the body and tail of pancreas do not enhance. No focal or rim enhancing fluid collection identified in the retroperitoneal space. Spleen:  No splenomegaly. No focal mass lesion. Adrenals/Urinary Tract: No adrenal nodule or mass. Limited assessment of the kidney shows no hydronephrosis or gross mass lesion. Stomach/Bowel: Stomach is nondistended. No gastric wall thickening. No evidence of outlet obstruction. Duodenum is normally positioned as is the ligament of Treitz. No small bowel or colonic dilatation within the visualized abdomen. Vascular/Lymphatic: Portal vein and superior mesenteric vein are patent. Splenic vein is patent. Celiac axis and SMA are patent. Small retroperitoneal / peripancreatic lymph nodes noted. Other: Free fluid is noted  around the liver and spleen. Mesenteric edema/fluid is evident. Musculoskeletal: No abnormal marrow enhancement within the visualized bony anatomy. IMPRESSION: 1. Markedly motion degraded study. 2. Cholelithiasis with borderline distention of the extrahepatic bile ducts. 3 mm filling defect in the common duct as it enters the head of the pancreas is consistent with with choledocholithiasis. 3. Nonenhancement involving much of the pancreatic body and tail parenchyma, highly suggestive of pancreatic necrosis. 4. Extensive edema/inflammation in the retroperitoneal space with intraperitoneal fluid and diffuse mesenteric edema in the abdomen. No focal/organized or rim enhancing fluid collection at this time to suggest evolving pseudocyst or abscess. 5. Hepatomegaly. 6. Portal vein is patent. Electronically Signed   By: Misty Stanley M.D.   On: 11/04/2017 08:12   Ct Renal Stone Study  Result Date: 10/25/2017 CLINICAL DATA:  Left-sided flank pain and hematuria.  Vomiting. EXAM: CT ABDOMEN AND PELVIS WITHOUT CONTRAST TECHNIQUE: Multidetector CT imaging of the abdomen and pelvis was performed following the standard protocol without oral or intravenous contrast material  administration. COMPARISON:  CT abdomen and pelvis April 09, 2008 FINDINGS: Lower chest: There is bibasilar atelectatic change. Hepatobiliary: There is hepatic steatosis. No focal liver lesions are evident on this noncontrast enhanced study. Gallbladder wall is not appreciably thickened. There is no biliary duct dilatation. Pancreas: There is edema throughout the pancreas with soft tissue stranding in the adjacent peripancreatic mesentery throughout the peripancreatic region. Fluid tracks to the right of the pancreatic head and inferior to the uncinate process as well as anterior to the pancreatic head. Fluid tracks from the body and tail the pancreas anteriorly to reach the greater curvature of the stomach in the midportion of the gastric body region. A well-defined pseudocyst type lesion is not seen. There is no pancreatic duct dilatation. Spleen: No splenic lesions are evident. Adrenals/Urinary Tract: Adrenals bilaterally appear normal. Kidneys bilaterally show no evident mass or hydronephrosis. There is a 1 mm calculus in the upper pole left kidney, nonobstructing. There is no evident ureteral calculus on either side. Urinary bladder is midline with wall thickness within normal limits. Stomach/Bowel: There is mild wall thickening along the greater curvature of the stomach in the midbody region in along the second and third duodenum due to nearby pancreatitis. No other bowel wall thickening is evident. There is no appreciable bowel obstruction. No evident free air or portal venous air. Vascular/Lymphatic: There is no abdominal aortic aneurysm. No vascular lesions are appreciable on this study. No adenopathy is evident in the abdomen or pelvis. Reproductive: Uterus is anteverted. There is an apparent leiomyoma extending from the leftward aspect of the uterus anteriorly measuring 4.3 x 4.0 cm. No extrauterine pelvic mass evident. Other: Appendix appears normal. No abscess is seen in the abdomen or pelvis. No free  pelvic fluid seen. Loculated fluid adjacent to the pancreas is noted. Musculoskeletal: There is degenerative change in the lumbar spine. There are no blastic or lytic bone lesions. There is no intramuscular or abdominal wall lesion evident. IMPRESSION: 1. Acute pancreatitis with pancreatic edema and peripancreatic fluid, most notably anterior to the body and tail of the pancreas. No well-defined pseudocyst or noncystic mass. No pancreatic duct dilatation. 2. Areas of gastritis and duodenitis due to adjacent pancreatitis causing inflammation in these areas. No bowel obstruction. No abscess. Appendix appears normal. 3. 1 mm nonobstructing calculus upper pole left kidney. No hydronephrosis or ureteral calculus on either side. 4.  Hepatic steatosis. 5. 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 Electronically Signed   By: Lowella Grip III M.D.   On: 10/28/2017 12:04   IMPRESSION:  *57 year old female with severe gallstone pancreatitis.  Necrosis seen on CT scan.  CBD stone confirmed on MRCP.  PLAN: *Needs aggressive hydration so we have ordered 2 Liter fluid bolus and then rate will be increased to 200 mL/hr.   *Will recheck CBC and CMP at noon today to trend Hgb and renal function. *Will start Zosyn (got one dose in the ED). *Will need ERCP once pancreatitis calms down. *Will consult surgery later once pancreatitis is improved as she will also need cholecystectomy at some point.  Laban Emperor. Zehr  11/04/2017, 9:00 AM  Pager number [phone]   Attending physician's note   I have taken a history, examined the patient and reviewed the chart. I agree with the Advanced Practitioner's note, impression and recommendations. 57 year old female presented to ER yesterday with severe pancreatitis.  BUN 16, creatinine 1.3, WBC 11.5 and hemo concentrated with hemoglobin 16.2.  Bilirubin was elevated at 4.4, AST 497, ALT 601 and alk phos 159. CT  abdomen pelvis showed severe pancreatic and peripancreatic inflammation.  MRCP suggestive of small CBD stone 3 mm in size Patient received only 2.5 L fluids in the past 24 hours BUN is up at 27, worsening creatinine 2.5 and persistent hemoconcentration with hemoglobin 16.1 She is at increased risk for pancreatic necrosis and poor prognosis Requested bolus 2 L and increased fluid resuscitation rate.  We will recheck labs at noon and based on that we will consider additional fluid bolus Worsening bilirubin could be secondary to periportal edema with biliary compression in the setting of severe pancreatitis in addition to the small CBD stone Clinically not suggestive of cholangitis but will empirically start antibiotics Plan for ERCP once patient is clinically stable from the pancreatitis standpoint in the next 2-3 days Will need cholecystectomy once the acute process resolves   K Denzil Magnuson, MD [phone] Mon-Fri 8a-5p [phone] after 5p, weekends, holidays

## 2017-11-04 NOTE — Progress Notes (Signed)
Patient was transferred to [facility] due [facility]. Patient now resting comfortably in bed at this time.

## 2017-11-04 NOTE — Progress Notes (Addendum)
Patient's pain still 9/10 after morphine given. HR in 120s, R30s & BP 158 114. Paged K. Schorr. 4 mg IV morphine ordered and given. Continue to monitor.

## 2017-11-05 DIAGNOSIS — J9621 Acute and chronic respiratory failure with hypoxia: Secondary | ICD-10-CM

## 2017-11-05 LAB — COMPREHENSIVE METABOLIC PANEL
ALK PHOS: 86 U/L (ref 38–126)
ALT: 270 U/L — AB (ref 14–54)
ANION GAP: 8 (ref 5–15)
AST: 92 U/L — ABNORMAL HIGH (ref 15–41)
Albumin: 2.8 g/dL — ABNORMAL LOW (ref 3.5–5.0)
BUN: 43 mg/dL — ABNORMAL HIGH (ref 6–20)
CALCIUM: 5.1 mg/dL — AB (ref 8.9–10.3)
CO2: 19 mmol/L — AB (ref 22–32)
CREATININE: 3.2 mg/dL — AB (ref 0.44–1.00)
Chloride: 110 mmol/L (ref 101–111)
GFR, EST AFRICAN AMERICAN: 18 mL/min — AB (ref >60)
GFR, EST NON AFRICAN AMERICAN: 15 mL/min — AB (ref >60)
Glucose, Bld: 206 mg/dL — ABNORMAL HIGH (ref 65–99)
Potassium: 4.7 mmol/L (ref 3.5–5.1)
SODIUM: 137 mmol/L (ref 135–145)
TOTAL PROTEIN: 5.4 g/dL — AB (ref 6.5–8.1)
Total Bilirubin: 5.3 mg/dL — ABNORMAL HIGH (ref 0.3–1.2)

## 2017-11-05 LAB — BASIC METABOLIC PANEL
Anion gap: 10 (ref 5–15)
BUN: 49 mg/dL — AB (ref 6–20)
CALCIUM: 4.8 mg/dL — AB (ref 8.9–10.3)
CO2: 18 mmol/L — ABNORMAL LOW (ref 22–32)
CREATININE: 3.55 mg/dL — AB (ref 0.44–1.00)
Chloride: 108 mmol/L (ref 101–111)
GFR calc Af Amer: 16 mL/min — ABNORMAL LOW (ref >60)
GFR, EST NON AFRICAN AMERICAN: 13 mL/min — AB (ref >60)
GLUCOSE: 187 mg/dL — AB (ref 65–99)
POTASSIUM: 4.5 mmol/L (ref 3.5–5.1)
SODIUM: 136 mmol/L (ref 135–145)

## 2017-11-05 LAB — GLUCOSE, CAPILLARY
GLUCOSE-CAPILLARY: 155 mg/dL — AB (ref 65–99)
GLUCOSE-CAPILLARY: 164 mg/dL — AB (ref 65–99)
GLUCOSE-CAPILLARY: 175 mg/dL — AB (ref 65–99)
GLUCOSE-CAPILLARY: 176 mg/dL — AB (ref 65–99)
GLUCOSE-CAPILLARY: 190 mg/dL — AB (ref 65–99)
GLUCOSE-CAPILLARY: 199 mg/dL — AB (ref 65–99)

## 2017-11-05 LAB — CBC
HCT: 40.7 % (ref 36.0–46.0)
HEMOGLOBIN: 13.7 g/dL (ref 12.0–15.0)
MCH: 30.2 pg (ref 26.0–34.0)
MCHC: 33.7 g/dL (ref 30.0–36.0)
MCV: 89.6 fL (ref 78.0–100.0)
Platelets: 221 10*3/uL (ref 150–400)
RBC: 4.54 MIL/uL (ref 3.87–5.11)
RDW: 15.6 % — ABNORMAL HIGH (ref 11.5–15.5)
WBC: 17.3 10*3/uL — ABNORMAL HIGH (ref 4.0–10.5)

## 2017-11-05 LAB — LIPASE, BLOOD: LIPASE: 850 U/L — AB (ref 11–51)

## 2017-11-05 LAB — HEMOGLOBIN A1C
HEMOGLOBIN A1C: 5.4 % (ref 4.8–5.6)
MEAN PLASMA GLUCOSE: 108.28 mg/dL

## 2017-11-05 LAB — LACTATE DEHYDROGENASE: LDH: 536 U/L — ABNORMAL HIGH (ref 98–192)

## 2017-11-05 LAB — MAGNESIUM: Magnesium: 1.9 mg/dL (ref 1.7–2.4)

## 2017-11-05 MED ORDER — FENTANYL CITRATE (PF) 100 MCG/2ML IJ SOLN
25.0000 ug | INTRAMUSCULAR | Status: DC | PRN
  Administered 2017-11-05 – 2017-11-06 (×6): 75 ug via INTRAVENOUS
  Filled 2017-11-05 (×6): qty 2

## 2017-11-05 MED ORDER — SODIUM CHLORIDE 0.9 % IV SOLN
1.0000 g | Freq: Once | INTRAVENOUS | Status: AC
  Administered 2017-11-05: 1 g via INTRAVENOUS
  Filled 2017-11-05 (×2): qty 10

## 2017-11-05 MED ORDER — MORPHINE SULFATE (PF) 4 MG/ML IV SOLN
6.0000 mg | INTRAVENOUS | Status: DC | PRN
  Filled 2017-11-05: qty 2

## 2017-11-05 MED ORDER — FENTANYL CITRATE (PF) 100 MCG/2ML IJ SOLN
25.0000 ug | INTRAMUSCULAR | Status: DC | PRN
  Administered 2017-11-05 (×4): 25 ug via INTRAVENOUS
  Filled 2017-11-05 (×4): qty 2

## 2017-11-05 MED ORDER — PROMETHAZINE HCL 25 MG/ML IJ SOLN
12.5000 mg | Freq: Once | INTRAMUSCULAR | Status: AC
  Administered 2017-11-05: 12.5 mg via INTRAVENOUS
  Filled 2017-11-05: qty 1

## 2017-11-05 MED ORDER — LIP MEDEX EX OINT
TOPICAL_OINTMENT | CUTANEOUS | Status: DC | PRN
  Filled 2017-11-05 (×2): qty 7

## 2017-11-05 MED ORDER — FENTANYL CITRATE (PF) 100 MCG/2ML IJ SOLN: 12.5000 ug | INTRAMUSCULAR | Status: DC | PRN

## 2017-11-05 MED ORDER — ONDANSETRON HCL 4 MG/2ML IJ SOLN
4.0000 mg | INTRAMUSCULAR | Status: DC | PRN
  Administered 2017-11-05: 4 mg via INTRAVENOUS
  Administered 2017-11-05 (×3): 2 mg via INTRAVENOUS
  Administered 2017-11-06 (×4): 4 mg via INTRAVENOUS
  Filled 2017-11-05 (×8): qty 2

## 2017-11-05 MED ORDER — ENOXAPARIN SODIUM 30 MG/0.3ML ~~LOC~~ SOLN
30.0000 mg | SUBCUTANEOUS | Status: DC
  Administered 2017-11-05 – 2017-11-06 (×2): 30 mg via SUBCUTANEOUS
  Filled 2017-11-05 (×2): qty 0.3

## 2017-11-05 NOTE — Progress Notes (Signed)
CRITICAL VALUE ALERT  Critical Value: Calcium 5.1  Date & Time Notied: 0545 11/05/17  Provider Notified: [facility]  Orders Received/Actions taken: pending

## 2017-11-05 NOTE — Progress Notes (Addendum)
[facility] Progress Note  CC:  Gallstone pancreatitis  Subjective:  Still feels terrible but pain is better today.  Reports being extremely tired, which is understandable.  Objective:  Vital signs in last 24 hours: Temp:  [97.4 F (36.3 C)-98.3 F (36.8 C)] 98.1 F (36.7 C) (01/16 0718) Pulse Rate:  [110-127] 119 (01/16 0800) Resp:  [16-33] 23 (01/16 0800) BP: (103-147)/(44-112) 123/101 (01/16 0800) SpO2:  [88 %-98 %] 93 % (01/16 0800) Weight:  [308 lb 10.3 oz (140 kg)] 308 lb 10.3 oz (140 kg) (01/16 0359) Last BM Date: 11/02/17 General:  Alert, Well-developed, in NAD Heart:  Sinus tach; no murmurs Pulm:  CTAB.  Tachypnic, but breaths are unlabored. Abdomen:  Soft, non-distended.  BS present but quiet.  Moderate upper abdominal TTP. Extremities:  Without edema. Neurologic:  Alert and oriented x 4;  grossly normal neurologically. Psych:  Alert and cooperative. Normal mood and affect.  Intake/Output from previous day: 01/15 0701 - 01/16 0700 In: 6795 [I.V.:4425; IV Piggyback:2370] Out: 575 [Urine:575] Intake/Output this shift: Total I/O In: -  Out: 75 [Urine:75]  Lab Results: Recent Labs    11/04/17 0254 11/04/17 1144 11/05/17 0242  WBC 18.0* 15.2* 17.3*  HGB 16.1* 14.9 13.7  HCT 46.5* 43.9 40.7  PLT 302 248 221   BMET Recent Labs    11/04/17 1144 11/04/17 1548 11/05/17 0242  NA 138 136 137  K 4.0 4.2 4.7  CL 110 108 110  CO2 19* 20* 19*  GLUCOSE 254* 225* 206*  BUN 37* 37* 43*  CREATININE 2.72* 2.49* 3.20*  CALCIUM 5.8* 5.7* 5.1*   LFT Recent Labs    11/05/17 0242  PROT 5.4*  ALBUMIN 2.8*  AST 92*  ALT 270*  ALKPHOS 86  BILITOT 5.3*   [place] Abdomen Complete  Result Date: 10/25/2017 CLINICAL DATA:  Epigastric and right upper quadrant discomfort, syncope, vomiting, hematuria. EXAM: ABDOMEN ULTRASOUND COMPLETE COMPARISON:  Abdominal and pelvic CT scan of today's date FINDINGS: Gallbladder: Probable small stones. No gallbladder  wall thickening or pericholecystic fluid or positive sonographic Murphy's sign. Common bile duct: Diameter: 6.7 mm Liver: Visualization of the hepatic parenchyma is limited by bowel poor penetration of the ultrasound beam. The echotexture is subjectively increased. There is no focal mass or ductal dilation. Portal vein is patent on color Doppler imaging with normal direction of blood flow towards the liver. IVC: No abnormality visualized. Pancreas: Visualization of the pancreas is limited due to bowel gas. Spleen: Size and appearance within normal limits. Right Kidney: Length: 13.2 cm. Echogenicity within normal limits. No mass or hydronephrosis visualized. Left Kidney: Length: 11.9 cm. Echogenicity within normal limits. No mass or hydronephrosis visualized. Abdominal aorta: Bowel gas limits evaluation of the abdominal aorta. Other findings: No significant ascites is observed. IMPRESSION: The study is limited overall due to bowel gas and the patient's body habitus. Multiple tiny gallstones are suspected. There is mild prominence of the common bile duct without definite intraluminal stones. Increased hepatic echotexture compatible with fatty infiltrative change. Poor evaluation of the pancreas due to bowel gas and surrounding inflammatory changes. Electronically Signed   By: David  SwazilandJordan M.D.   On: 10/28/2017 14:02   Mr 3d Recon At Scanner  Result Date: 11/04/2017 CLINICAL DATA:  Pancreatitis with gallstones. EXAM: MRI ABDOMEN WITHOUT AND WITH CONTRAST (INCLUDING MRCP) TECHNIQUE: Multiplanar multisequence MR imaging of the abdomen was performed both before and after the administration of intravenous contrast. Heavily T2-weighted images of the biliary and pancreatic ducts  were obtained, and three-dimensional MRCP images were rendered by post processing. CONTRAST:  20 cc MultiHance COMPARISON:  CT scan 11/03/2016 FINDINGS: Markedly motion degraded study. Lower chest: The heart is enlarged without substantial  pericardial effusion. Hepatobiliary: Liver measures 20.8 cm and craniocaudal length, enlarged. Multiple gallstones are identified, measuring up to 7 mm diameter. Some stones or trapped behind a fold in the gallbladder fundus. MRCP images are markedly motion degraded and nondiagnostic for tiny common duct stones. Axial T2 weighted single shot imaging shows common duct measuring 7 mm diameter. Common bile duct in the head of the pancreas also measures 7 mm diameter. The axial T2 weighted sequences reveal some central flow artifact in the common duct although a very tiny dependent signal void is seen in the common bile duct on axial image 34 series 5 that maps to a tiny filling defect in the common bile duct seen on coronal MRCP image 34 of series 6. This appearance is compatible with a tiny common bile duct stone. Pancreas: . pancreas is diffusely edematous with peripancreatic edema tracking diffusely in the anterior pararenal space. Postcontrast imaging shows enhancement in the head of pancreas extreme pancreatic tail but most of the body and tail of pancreas do not enhance. No focal or rim enhancing fluid collection identified in the retroperitoneal space. Spleen:  No splenomegaly. No focal mass lesion. Adrenals/Urinary Tract: No adrenal nodule or mass. Limited assessment of the kidney shows no hydronephrosis or gross mass lesion. Stomach/Bowel: Stomach is nondistended. No gastric wall thickening. No evidence of outlet obstruction. Duodenum is normally positioned as is the ligament of Treitz. No small bowel or colonic dilatation within the visualized abdomen. Vascular/Lymphatic: Portal vein and superior mesenteric vein are patent. Splenic vein is patent. Celiac axis and SMA are patent. Small retroperitoneal / peripancreatic lymph nodes noted. Other: Free fluid is noted around the liver and spleen. Mesenteric edema/fluid is evident. Musculoskeletal: No abnormal marrow enhancement within the visualized bony anatomy.  IMPRESSION: 1. Markedly motion degraded study. 2. Cholelithiasis with borderline distention of the extrahepatic bile ducts. 3 mm filling defect in the common duct as it enters the head of the pancreas is consistent with with choledocholithiasis. 3. Nonenhancement involving much of the pancreatic body and tail parenchyma, highly suggestive of pancreatic necrosis. 4. Extensive edema/inflammation in the retroperitoneal space with intraperitoneal fluid and diffuse mesenteric edema in the abdomen. No focal/organized or rim enhancing fluid collection at this time to suggest evolving pseudocyst or abscess. 5. Hepatomegaly. 6. Portal vein is patent. Electronically Signed   By: Kennith Center M.D.   On: 11/04/2017 08:12   Dg Chest Port 1 View  Result Date: 11/04/2017 CLINICAL DATA:  Shortness of breath, weakness, and fever. EXAM: PORTABLE CHEST 1 VIEW COMPARISON:  Report from prior chest radiograph 10/19/2013 FINDINGS: The patient is rotated to the right on today's radiograph, reducing diagnostic sensitivity and specificity. Mild enlargement of the cardiopericardial silhouette. Prominence of mediastinal contour is probably from fatty mediastinal tissues based on comparison to the recent abdomen MRI. Low lung volumes are present, causing crowding of the pulmonary vasculature. Indistinct airspace opacity at the left lung base compatible with atelectasis or pneumonia. Similar indistinct density at the right lung base medially. Rightward tracheal deviation, partly rotation related IMPRESSION: 1. Left greater than right airspace opacities in the lung bases favoring atelectasis or pneumonia. 2. Low lung volumes are present, causing crowding of the pulmonary vasculature. 3. Mild enlargement of the cardiopericardial silhouette. Electronically Signed   By: Annitta Needs.D.  On: 11/04/2017 14:27   Mr Abdomen Mrcp Vivien Rossetti Contast  Result Date: 11/04/2017 CLINICAL DATA:  Pancreatitis with gallstones. EXAM: MRI ABDOMEN  WITHOUT AND WITH CONTRAST (INCLUDING MRCP) TECHNIQUE: Multiplanar multisequence MR imaging of the abdomen was performed both before and after the administration of intravenous contrast. Heavily T2-weighted images of the biliary and pancreatic ducts were obtained, and three-dimensional MRCP images were rendered by post processing. CONTRAST:  20 cc MultiHance COMPARISON:  CT scan 11/03/2016 FINDINGS: Markedly motion degraded study. Lower chest: The heart is enlarged without substantial pericardial effusion. Hepatobiliary: Liver measures 20.8 cm and craniocaudal length, enlarged. Multiple gallstones are identified, measuring up to 7 mm diameter. Some stones or trapped behind a fold in the gallbladder fundus. MRCP images are markedly motion degraded and nondiagnostic for tiny common duct stones. Axial T2 weighted single shot imaging shows common duct measuring 7 mm diameter. Common bile duct in the head of the pancreas also measures 7 mm diameter. The axial T2 weighted sequences reveal some central flow artifact in the common duct although a very tiny dependent signal void is seen in the common bile duct on axial image 34 series 5 that maps to a tiny filling defect in the common bile duct seen on coronal MRCP image 34 of series 6. This appearance is compatible with a tiny common bile duct stone. Pancreas: . pancreas is diffusely edematous with peripancreatic edema tracking diffusely in the anterior pararenal space. Postcontrast imaging shows enhancement in the head of pancreas extreme pancreatic tail but most of the body and tail of pancreas do not enhance. No focal or rim enhancing fluid collection identified in the retroperitoneal space. Spleen:  No splenomegaly. No focal mass lesion. Adrenals/Urinary Tract: No adrenal nodule or mass. Limited assessment of the kidney shows no hydronephrosis or gross mass lesion. Stomach/Bowel: Stomach is nondistended. No gastric wall thickening. No evidence of outlet obstruction.  Duodenum is normally positioned as is the ligament of Treitz. No small bowel or colonic dilatation within the visualized abdomen. Vascular/Lymphatic: Portal vein and superior mesenteric vein are patent. Splenic vein is patent. Celiac axis and SMA are patent. Small retroperitoneal / peripancreatic lymph nodes noted. Other: Free fluid is noted around the liver and spleen. Mesenteric edema/fluid is evident. Musculoskeletal: No abnormal marrow enhancement within the visualized bony anatomy. IMPRESSION: 1. Markedly motion degraded study. 2. Cholelithiasis with borderline distention of the extrahepatic bile ducts. 3 mm filling defect in the common duct as it enters the head of the pancreas is consistent with with choledocholithiasis. 3. Nonenhancement involving much of the pancreatic body and tail parenchyma, highly suggestive of pancreatic necrosis. 4. Extensive edema/inflammation in the retroperitoneal space with intraperitoneal fluid and diffuse mesenteric edema in the abdomen. No focal/organized or rim enhancing fluid collection at this time to suggest evolving pseudocyst or abscess. 5. Hepatomegaly. 6. Portal vein is patent. Electronically Signed   By: Kennith Center M.D.   On: 11/04/2017 08:12   Ct Renal Stone Study  Result Date: 10/25/2017 CLINICAL DATA:  Left-sided flank pain and hematuria.  Vomiting. EXAM: CT ABDOMEN AND PELVIS WITHOUT CONTRAST TECHNIQUE: Multidetector CT imaging of the abdomen and pelvis was performed following the standard protocol without oral or intravenous contrast material administration. COMPARISON:  CT abdomen and pelvis April 09, 2008 FINDINGS: Lower chest: There is bibasilar atelectatic change. Hepatobiliary: There is hepatic steatosis. No focal liver lesions are evident on this noncontrast enhanced study. Gallbladder wall is not appreciably thickened. There is no biliary duct dilatation. Pancreas: There is edema throughout  the pancreas with soft tissue stranding in the adjacent  peripancreatic mesentery throughout the peripancreatic region. Fluid tracks to the right of the pancreatic head and inferior to the uncinate process as well as anterior to the pancreatic head. Fluid tracks from the body and tail the pancreas anteriorly to reach the greater curvature of the stomach in the midportion of the gastric body region. A well-defined pseudocyst type lesion is not seen. There is no pancreatic duct dilatation. Spleen: No splenic lesions are evident. Adrenals/Urinary Tract: Adrenals bilaterally appear normal. Kidneys bilaterally show no evident mass or hydronephrosis. There is a 1 mm calculus in the upper pole left kidney, nonobstructing. There is no evident ureteral calculus on either side. Urinary bladder is midline with wall thickness within normal limits. Stomach/Bowel: There is mild wall thickening along the greater curvature of the stomach in the midbody region in along the second and third duodenum due to nearby pancreatitis. No other bowel wall thickening is evident. There is no appreciable bowel obstruction. No evident free air or portal venous air. Vascular/Lymphatic: There is no abdominal aortic aneurysm. No vascular lesions are appreciable on this study. No adenopathy is evident in the abdomen or pelvis. Reproductive: Uterus is anteverted. There is an apparent leiomyoma extending from the leftward aspect of the uterus anteriorly measuring 4.3 x 4.0 cm. No extrauterine pelvic mass evident. Other: Appendix appears normal. No abscess is seen in the abdomen or pelvis. No free pelvic fluid seen. Loculated fluid adjacent to the pancreas is noted. Musculoskeletal: There is degenerative change in the lumbar spine. There are no blastic or lytic bone lesions. There is no intramuscular or abdominal wall lesion evident. IMPRESSION: 1. Acute pancreatitis with pancreatic edema and peripancreatic fluid, most notably anterior to the body and tail of the pancreas. No well-defined pseudocyst or  noncystic mass. No pancreatic duct dilatation. 2. Areas of gastritis and duodenitis due to adjacent pancreatitis causing inflammation in these areas. No bowel obstruction. No abscess. Appendix appears normal. 3. 1 mm nonobstructing calculus upper pole left kidney. No hydronephrosis or ureteral calculus on either side. 4.  Hepatic steatosis. 5. 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 Electronically Signed   By: Bretta Bang III M.D.   On: 10/23/2017 12:04   Assessment / Plan: *57 year old female with severe gallstone pancreatitis.  Necrosis seen on CT scan.  CBD stone confirmed on MRCP. *AKI in the setting of pancreatitis and volume depletion  -Need to continue aggressive hydration.   -Monitor CBC and CMP. -Continue Zosyn. -Will need ERCP once pancreatitis calms down. -Will consult surgery later once pancreatitis is improved as she will also need cholecystectomy at some point.    LOS: 2 days   Princella Pellegrini. Zehr  11/05/2017, 9:38 AM  Pager number [phone]   Attending physician's note   I have taken an interval history, reviewed the chart and examined the patient. I agree with the Advanced Practitioner's note, impression and recommendations.  BUN and Cr continue to rise with low urine output Severe pancreatitis with necrosis Will continue to monitor and plan for ERCP once patient is more stable to undergo the procedure, potentially on Friday Appreciate supportive care by PCCM  K Scherry Ran, MD [phone] Mon-Fri 8a-5p [phone] after 5p, weekends, holidays

## 2017-11-05 NOTE — Progress Notes (Signed)
PULMONARY / CRITICAL CARE MEDICINE   Name: Emma Washington MRN: [id number] DOB: 1960-12-20    ADMISSION DATE:  11/02/2017 CONSULTATION DATE:  11/04/2017  REFERRING MD:  Dr. 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 Other associated symptoms include nausea, vomiting and decreased PO intake (since 1/13). Pertinent PMHx significant for nephrolithiasis.   Initial labs show lipase >10,000, mildly elevated WBC of 11.5, creatinine 1.33, AST of 497 and ALT of 601. UA positive for ketones, protein, mucus and rare bacteria. UDS positive for opiates. CT renal stone study showed evidence of acute pancreatitis with pancreatic edema and peripancreatic fluid anteriorly to the body and tail of the pancreas as well as a 1 mm non obstructing stone in the upper pole of the left kidney.  Patient was admitted and GI consulted. GI suggested MRCP to be done today which shows cholelithiasis, possible choledocolithiasis, possible pancreatic necrosis of the body and tail, edema/inflammation in the retroperitoneal space and hepatomegaly. CXR today indicate low lung volumes and possible bilateral atelectasis vs PNA.  Patient was tachypnic with concern for decompensation which prompted a critical care consult. She is currently on protonix, zosyn, morphine, fluids, phenergan, reglan and zofran.    SUBJECTIVE:  Pt feels better Denies dyspnea at this time She has nausea and has had some emesis Note progressive renal dysfxn Approximately 9L volume resuscitation total  VITAL SIGNS: BP (!) 123/101 (BP Location: Left Arm)   Pulse (!) 119   Temp 98.1 F (36.7 C) (Oral)   Resp (!) 23   Ht 5\' 9"  (1.753 m)   Wt (!) 140 kg (308 lb 10.3 oz)   LMP 11/07/2011   SpO2 93%   BMI 45.58 kg/m   HEMODYNAMICS:    VENTILATOR SETTINGS:    INTAKE / OUTPUT: I/O last 3 completed  shifts: In: 7568.3 [I.V.:5198.3; IV Piggyback:2370] Out: 825 [Urine:825]  PHYSICAL EXAMINATION: General: obese woman, a bit more comfortable.  HEENT: dry OP, eating ice, stronger voice today Neuro: awake, appropriate, non-focal CV: tachycardic, regular, no M PULM: decreased at both bases, some mild exp wheezes laterally GI: soft, obese, mild tenderness, no rebound or guarding Extremities: warm/dry, no edema  Skin: no rashes or lesions  LABS:  BMET Recent Labs  Lab 11/04/17 1144 11/04/17 1548 11/05/17 0242  NA 138 136 137  K 4.0 4.2 4.7  CL 110 108 110  CO2 19* 20* 19*  BUN 37* 37* 43*  CREATININE 2.72* 2.49* 3.20*  GLUCOSE 254* 225* 206*    Electrolytes Recent Labs  Lab 11/04/17 1144 11/04/17 1548 11/05/17 0242  CALCIUM 5.8* 5.7* 5.1*  MG 1.3*  --  1.9    CBC Recent Labs  Lab 11/04/17 0254 11/04/17 1144 11/05/17 0242  WBC 18.0* 15.2* 17.3*  HGB 16.1* 14.9 13.7  HCT 46.5* 43.9 40.7  PLT 302 248 221    Coag's No results for input(s): APTT, INR in the last 168 hours.  Sepsis Markers No results for input(s): LATICACIDVEN, PROCALCITON, O2SATVEN in the last 168 hours.  ABG No results for input(s): PHART, PCO2ART, PO2ART in the last 168 hours.  Liver Enzymes Recent Labs  Lab 11/04/17 0254 11/04/17 1144 11/05/17 0242  AST 231* 164* 92*  ALT 480* 390* 270*  ALKPHOS 118 98 86  BILITOT 5.8* 6.4* 5.3*  ALBUMIN 3.5 3.0* 2.8*    Cardiac Enzymes No results  for input(s): TROPONINI, PROBNP in the last 168 hours.  Glucose Recent Labs  Lab 11/04/17 1628 11/04/17 1931 11/05/17 0004 11/05/17 0314 11/05/17 0800  GLUCAP 207* 193* 190* 199* 176*    Imaging Dg Chest Port 1 View  Result Date: 11/04/2017 CLINICAL DATA:  Shortness of breath, weakness, and fever. EXAM: PORTABLE CHEST 1 VIEW COMPARISON:  Report from prior chest radiograph 10/19/2013 FINDINGS: The patient is rotated to the right on today's radiograph, reducing diagnostic sensitivity and  specificity. Mild enlargement of the cardiopericardial silhouette. Prominence of mediastinal contour is probably from fatty mediastinal tissues based on comparison to the recent abdomen MRI. Low lung volumes are present, causing crowding of the pulmonary vasculature. Indistinct airspace opacity at the left lung base compatible with atelectasis or pneumonia. Similar indistinct density at the right lung base medially. Rightward tracheal deviation, partly rotation related IMPRESSION: 1. Left greater than right airspace opacities in the lung bases favoring atelectasis or pneumonia. 2. Low lung volumes are present, causing crowding of the pulmonary vasculature. 3. Mild enlargement of the cardiopericardial silhouette. Electronically Signed   By: Gaylyn Rong M.D.   On: 11/04/2017 14:27     STUDIES:  CT Renal Stone Study 1/14 >> acute pancreatitis with pancreatic edema and peripancreatic fluid anteriorly to the body and tail of the pancreas; 1 mm non obstructing stone in the upper pole of the left kidney; gastritis/duodenitis d/t pancreatitis; hepatic steatosis; 4.3 x 4.0 cm leiomyoma on the fundus of uterus  US abdomen 1/14 >> limited visualization d/t body habitus, multiple small gallstones, increased hepatic echotexture compatible with fatty liver disease.   MR abdomen/MRCP 1/15 >> cholelithiasis, possible choledocolithiasis, possible pancreatic necrosis of the body and tail, edema/inflammation in the retroperitoneal space and hepatomegaly  CXR 1/15 >> Atelectasis vs PNA L>R; low lung volumes, mild cardiopericadrial silhouette enlargement.  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    ASSESSMENT / PLAN:  PULMONARY A: At Risk Respiratory Failure - in setting of volume resuscitation with pancreatitis, pain with  splinting. High risk ALI Tachypnea  P:   Continue aggressive pulm hygiene At risk for ALI - follow clinical status and CXR  CARDIOVASCULAR A:  Tachycardia   SIRS  P:  No evidence shock at this time.  Suspect that volume resuscitation has been adequate given current MAP.  RENAL A:   AKI - in setting of volume depletion (N/V, poor intake), pancreatitis. Consistent w ATN, should recover with restoration of perfusion pressures.  P:   Will need to balance continued volume resuscitation with resp status. Continue LR at 200cc/h for now. Low threshold to decrease if any evidence impending resp compromise.  Follow UOP (low) and serial BMP Replace electrolytes if indicated   GASTROINTESTINAL A:   Acute Moderately Severe Pancreatitis - concern for possible necrosis on MRCP  Gallstones Abdominal Pain  Nausea / vomiting P:   Add zofran 1/16 NPO x sips with meds for now. If no trend towards BiPAP or intubation then could consider liberalizing diet Anticipate ERCP, timing to be determined w GI assistance Will need CCS evaluation for cholecystectomy once stabilized   HEMATOLOGIC A:   Hx Anemia  P:  Trend CBC  Enoxaparin for DVT prophylaxis   INFECTIOUS A:   Gallstone Pancreatitis - triglycerides 213 P:   Monitor fever curve / WBC trend  Empiric Zosyn  in setting severe pancreatitis  ENDOCRINE A:   Hyperglycemia - in setting of pancreatitis   P:   SSI Q4  Monitor for hypoglycemia   NEUROLOGIC A:   Pain - secondary to gallstone pancreatitis  P:   RASS goal: n/a  PRN morphine for pain    FAMILY  - Updates: Discussed in detail with the patient 1/16 am    Independent CC time 33 minutes   Levy Pupaobert Mazen Marcin, MD, PhD 11/05/2017, 10:14 AM [facility] [phone] or if no answer [phone]

## 2017-11-05 NOTE — Progress Notes (Signed)
eLink Physician-Brief Progress Note Patient Name: Jannet MantisJoyce Khanna DOB: [birth year] MRN: [id number]   Date of Service  11/05/2017  HPI/Events of Note  Patient requesting additional pain medicaiton. Also requesting she start on Flomax for her renal stone Nausea not relived with Zofran  eICU Interventions  Increase in PRN fentanyl to 75 mcg Hold on Flomax as stone is not in the ureter and patient admits to hypotension with this medication - can address on AM rounds One time dose of Phenergan     Intervention Category Intermediate Interventions: Pain - evaluation and management  Eric Morganti 11/05/2017, 8:51 PM

## 2017-11-06 ENCOUNTER — Inpatient Hospital Stay (HOSPITAL_COMMUNITY): Payer: BLUE CROSS/BLUE SHIELD

## 2017-11-06 DIAGNOSIS — R110 Nausea: Secondary | ICD-10-CM

## 2017-11-06 DIAGNOSIS — R1084 Generalized abdominal pain: Secondary | ICD-10-CM

## 2017-11-06 DIAGNOSIS — K8689 Other specified diseases of pancreas: Secondary | ICD-10-CM

## 2017-11-06 LAB — GLUCOSE, CAPILLARY
GLUCOSE-CAPILLARY: 146 mg/dL — AB (ref 65–99)
GLUCOSE-CAPILLARY: 161 mg/dL — AB (ref 65–99)
Glucose-Capillary: 178 mg/dL — ABNORMAL HIGH (ref 65–99)
Glucose-Capillary: 170 mg/dL — ABNORMAL HIGH (ref 65–99)
Glucose-Capillary: 149 mg/dL — ABNORMAL HIGH (ref 65–99)
Glucose-Capillary: 156 mg/dL — ABNORMAL HIGH (ref 65–99)

## 2017-11-06 LAB — URINALYSIS, ROUTINE W REFLEX MICROSCOPIC
BILIRUBIN URINE: NEGATIVE
GLUCOSE, UA: 50 mg/dL — AB
Ketones, ur: NEGATIVE mg/dL
Leukocytes, UA: NEGATIVE
NITRITE: NEGATIVE
PROTEIN: 30 mg/dL — AB
Specific Gravity, Urine: 1.018 (ref 1.005–1.030)
pH: 5 (ref 5.0–8.0)

## 2017-11-06 LAB — SODIUM, URINE, RANDOM: Sodium, Ur: 12 mmol/L

## 2017-11-06 LAB — COMPREHENSIVE METABOLIC PANEL
ALBUMIN: 2.8 g/dL — AB (ref 3.5–5.0)
ALK PHOS: 75 U/L (ref 38–126)
ALT: 151 U/L — ABNORMAL HIGH (ref 14–54)
AST: 50 U/L — AB (ref 15–41)
Anion gap: 10 (ref 5–15)
BILIRUBIN TOTAL: 1.9 mg/dL — AB (ref 0.3–1.2)
BUN: 61 mg/dL — AB (ref 6–20)
CALCIUM: 4.2 mg/dL — AB (ref 8.9–10.3)
CO2: 19 mmol/L — AB (ref 22–32)
Chloride: 109 mmol/L (ref 101–111)
Creatinine, Ser: 4.51 mg/dL — ABNORMAL HIGH (ref 0.44–1.00)
GFR calc Af Amer: 12 mL/min — ABNORMAL LOW (ref >60)
GFR calc non Af Amer: 10 mL/min — ABNORMAL LOW (ref >60)
Glucose, Bld: 182 mg/dL — ABNORMAL HIGH (ref 65–99)
Potassium: 4 mmol/L (ref 3.5–5.1)
Sodium: 138 mmol/L (ref 135–145)
Total Protein: 5.5 g/dL — ABNORMAL LOW (ref 6.5–8.1)

## 2017-11-06 LAB — CK: Total CK: 710 U/L — ABNORMAL HIGH (ref 38–234)

## 2017-11-06 LAB — DIFFERENTIAL
BASOS PCT: 0 %
Basophils Absolute: 0 10*3/uL (ref 0.0–0.1)
EOS ABS: 0 10*3/uL (ref 0.0–0.7)
EOS PCT: 0 %
Lymphocytes Relative: 6 %
Lymphs Abs: 0.8 10*3/uL (ref 0.7–4.0)
MONO ABS: 0.9 10*3/uL (ref 0.1–1.0)
Monocytes Relative: 7 %
Neutro Abs: 11.1 10*3/uL — ABNORMAL HIGH (ref 1.7–7.7)
Neutrophils Relative %: 87 %

## 2017-11-06 LAB — CBC
HEMATOCRIT: 36.9 % (ref 36.0–46.0)
Hemoglobin: 12.2 g/dL (ref 12.0–15.0)
MCH: 29.8 pg (ref 26.0–34.0)
MCHC: 33.1 g/dL (ref 30.0–36.0)
MCV: 90.2 fL (ref 78.0–100.0)
PLATELETS: 224 10*3/uL (ref 150–400)
RBC: 4.09 MIL/uL (ref 3.87–5.11)
RDW: 16.2 % — ABNORMAL HIGH (ref 11.5–15.5)
WBC: 12.4 10*3/uL — ABNORMAL HIGH (ref 4.0–10.5)

## 2017-11-06 LAB — MAGNESIUM: Magnesium: 1.7 mg/dL (ref 1.7–2.4)

## 2017-11-06 LAB — CREATININE, URINE, RANDOM: Creatinine, Urine: 170.14 mg/dL

## 2017-11-06 LAB — PHOSPHORUS: Phosphorus: 1.9 mg/dL — ABNORMAL LOW (ref 2.5–4.6)

## 2017-11-06 MED ORDER — PROMETHAZINE HCL 25 MG/ML IJ SOLN
12.5000 mg | Freq: Four times a day (QID) | INTRAMUSCULAR | Status: DC | PRN
  Administered 2017-11-06 – 2017-11-17 (×21): 12.5 mg via INTRAVENOUS
  Filled 2017-11-06 (×22): qty 1

## 2017-11-06 MED ORDER — SODIUM GLYCEROPHOSPHATE 1 MMOLE/ML IV SOLN
20.0000 mmol | Freq: Once | INTRAVENOUS | Status: AC
  Administered 2017-11-06: 20 mmol via INTRAVENOUS
  Filled 2017-11-06: qty 20

## 2017-11-06 MED ORDER — SODIUM GLYCEROPHOSPHATE 1 MMOLE/ML IV SOLN
20.0000 mmol | Freq: Once | INTRAVENOUS | Status: DC
  Filled 2017-11-06: qty 20

## 2017-11-06 MED ORDER — SODIUM CHLORIDE 0.9 % IV SOLN
2.0000 g | Freq: Once | INTRAVENOUS | Status: AC
  Administered 2017-11-06: 2 g via INTRAVENOUS
  Filled 2017-11-06: qty 20

## 2017-11-06 MED ORDER — FENTANYL CITRATE (PF) 100 MCG/2ML IJ SOLN
25.0000 ug | INTRAMUSCULAR | Status: DC | PRN
  Administered 2017-11-06 – 2017-11-07 (×5): 100 ug via INTRAVENOUS
  Filled 2017-11-06 (×5): qty 2

## 2017-11-06 MED ORDER — MAGNESIUM SULFATE 2 GM/50ML IV SOLN
2.0000 g | Freq: Once | INTRAVENOUS | Status: AC
  Administered 2017-11-06: 2 g via INTRAVENOUS
  Filled 2017-11-06: qty 50

## 2017-11-06 NOTE — Progress Notes (Signed)
PULMONARY / CRITICAL CARE MEDICINE   Name: Emma Washington MRN: [id number] DOB: November 13, 1960    ADMISSION DATE:  11-09-2017 CONSULTATION DATE:  11/04/2017  REFERRING MD:  Dr. 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 Other associated symptoms include nausea, vomiting and decreased PO intake (since 1/13). Pertinent PMHx significant for nephrolithiasis.   Initial labs show lipase >10,000, mildly elevated WBC of 11.5, creatinine 1.33, AST of 497 and ALT of 601. UA positive for ketones, protein, mucus and rare bacteria. UDS positive for opiates. CT renal stone study showed evidence of acute pancreatitis with pancreatic edema and peripancreatic fluid anteriorly to the body and tail of the pancreas as well as a 1 mm non obstructing stone in the upper pole of the left kidney.  Patient was admitted and GI consulted. GI suggested MRCP to be done today which shows cholelithiasis, possible choledocolithiasis, possible pancreatic necrosis of the body and tail, edema/inflammation in the retroperitoneal space and hepatomegaly. CXR today indicate low lung volumes and possible bilateral atelectasis vs PNA.  Patient was tachypnic with concern for decompensation which prompted a critical care consult. She is currently on protonix, zosyn, morphine, fluids, phenergan, reglan and zofran.    SUBJECTIVE: 11L+ for admit, afebrile.  Hemodynamically stable.  Pt reports feeling better but continues to have nausea / vomiting, abdominal pain that comes in waves.    VITAL SIGNS: BP 119/86   Pulse (!) 116   Temp 98 F (36.7 C) (Oral)   Resp (!) 31   Ht 5\' 9"  (1.753 m)   Wt (!) 315 lb 4.1 oz (143 kg)   LMP 11/07/2011   SpO2 94%   BMI 46.56 kg/m   HEMODYNAMICS:    VENTILATOR SETTINGS:    INTAKE / OUTPUT: I/O last 3 completed shifts: In: 5260 [I.V.:5000; IV  Piggyback:260] Out: 395 [Urine:395]  PHYSICAL EXAMINATION: General: obese adult female, appears uncomfortable  HEENT: MM pink/dry Neuro: AAOx4, speech clear, MAE  CV: s1s2 rrr, tachy, no m/r/g PULM: even/non-labored, lungs bilaterally with wheezing  GI: mild distention, tender to palpation,  bsx4 active  Extremities: warm/dry, trace BLE edema  Skin: no rashes or lesions   LABS:  BMET Recent Labs  Lab 11/05/17 0242 11/05/17 1101 11/06/17 0248  NA 137 136 138  K 4.7 4.5 4.0  CL 110 108 109  CO2 19* 18* 19*  BUN 43* 49* 61*  CREATININE 3.20* 3.55* 4.51*  GLUCOSE 206* 187* 182*    Electrolytes Recent Labs  Lab 11/04/17 1144  11/05/17 0242 11/05/17 1101 11/06/17 0248  CALCIUM 5.8*   < > 5.1* 4.8* 4.2*  MG 1.3*  --  1.9  --  1.7  PHOS  --   --   --   --  1.9*   < > = values in this interval not displayed.    CBC Recent Labs  Lab 11/04/17 1144 11/05/17 0242 11/06/17 0248  WBC 15.2* 17.3* 12.4*  HGB 14.9 13.7 12.2  HCT 43.9 40.7 36.9  PLT 248 221 224    Coag's No results for input(s): APTT, INR in the last 168 hours.  Sepsis Markers No results for input(s): LATICACIDVEN, PROCALCITON, O2SATVEN in the last 168 hours.  ABG No results for input(s): PHART, PCO2ART, PO2ART in the last 168 hours.  Liver Enzymes Recent Labs  Lab 11/04/17 1144 11/05/17 0242 11/06/17 0248  AST 164* 92* 50*  ALT 390* 270* 151*  ALKPHOS 98 86 75  BILITOT 6.4* 5.3* 1.9*  ALBUMIN 3.0* 2.8* 2.8*    Cardiac Enzymes No results for input(s): TROPONINI, PROBNP in the last 168 hours.  Glucose Recent Labs  Lab 11/05/17 0800 11/05/17 1259 11/05/17 1613 11/05/17 2354 11/06/17 0320 11/06/17 0804  GLUCAP 176* 155* 164* 175* 170* 149*    Imaging No results found.   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   MR abdomen/MRCP 1/15 >> cholelithiasis, possible choledocolithiasis, possible pancreatic necrosis of the body and tail, edema/inflammation in the retroperitoneal space and hepatomegaly  CXR 1/15 >> Atelectasis vs PNA L>R; low lung volumes, mild cardiopericadrial silhouette enlargement.  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    ASSESSMENT / PLAN:  PULMONARY A: At Risk Respiratory Failure - in setting of volume resuscitation with pancreatitis, pain with splinting. High risk ALI Pleural Effusions Tachypnea  P:   Pulmonary hygiene - IS, mobilize  Follow CXR, at risk for ALI Decrease IVF > CXR with increased volume / work of breathing on exam.  Attempt to balance fluid resuscitation with respiratory status  CARDIOVASCULAR A:  Tachycardia   SIRS  P:  Monitor hemodynamics in ICU   RENAL A:   AKI / Oliguric - in setting of volume depletion (N/V, poor intake), pancreatitis. Consistent w ATN, should recover with restoration of perfusion pressures.  Hypophosphatemia  Hypomagnesemia  P:   Decrease IVF to 125 ml/hr Trend BMP / urinary output Replace electrolytes as indicated, Mg / [facility]os [facility] Avoid nephrotoxic agents, ensure adequate renal perfusion Nephrology consulted, appreciate input   GASTROINTESTINAL A:   Acute Moderately Severe Pancreatitis - concern for possible necrosis on MRCP  Gallstone Pancreatitis Abdominal Pain   Nausea / vomiting P:   PRN Zofran, phenergan, reglan NPO x sips with meds Anticipate ERCP, defer timing to GI, appreciate input  Will need CCS evaluation at some point for cholecystectomy  PPI  HEMATOLOGIC A:   Hx Anemia  P:  Trend CBC Enoxaparin for DVT prophylaxis   INFECTIOUS A:   Gallstone Pancreatitis - triglycerides 213 P:   Empiric zosyn in the setting of severe pancreatitis  Monitor fever curve / WBC trend   ENDOCRINE A:   Hyperglycemia - in setting of pancreatitis   P:   SSI Q4  Monitor for hypoglycemia   NEUROLOGIC A:   Pain - secondary to gallstone pancreatitis  P:   RASS goal: n/a  PRN fentanyl for pain    FAMILY  - Updates: Plan of care discussed with patient 1/17.       Canary BrimBrandi Mahum Betten, NP-C [facility] Pgr: [phone] or if no answer [phone] 11/06/2017, 8:40 AM

## 2017-11-06 NOTE — Progress Notes (Signed)
CRITICAL VALUE ALERT  Critical Value:   Calcium 4.2  Date & Time Notied:  11/06/2017 0440am  Provider Notified: E link  Orders Received/Actions taken: Waiting for orders, will continue to monitor

## 2017-11-06 NOTE — Progress Notes (Signed)
[facility] Progress Note  CC:  Gallstone pancreatitis  Subjective:  Feels terrible.  A lot of pain.  Due for pain medication soon.  A lot of nausea.  Zofran does not help, but the Reglan does.  Objective:  Vital signs in last 24 hours: Temp:  [98 F (36.7 C)-99 F (37.2 C)] 98 F (36.7 C) (01/17 0800) Pulse Rate:  [109-120] 109 (01/17 0830) Resp:  [21-41] 36 (01/17 0830) BP: (53-144)/(13-100) 144/80 (01/17 0830) SpO2:  [91 %-97 %] 94 % (01/17 0830) Weight:  [315 lb 4.1 oz (143 kg)] 315 lb 4.1 oz (143 kg) (01/17 0340) Last BM Date: 11/02/17 General:  Alert, Well-developed, in a lot of pain. Heart:  Tachy; no murmurs Pulm:  Tachyneic.  Wheezing noted B/L. Abdomen:  Distended.  BS present but quiet and sparse.  Diffuse TTP but severe in epigastrium/upper abdomen.  Extremities:  Without edema. Neurologic:  Alert and oriented x 4;  grossly normal neurologically. Psych:  Alert and cooperative. Normal mood and affect.  Intake/Output from previous day: 01/16 0701 - 01/17 0700 In: 3050 [I.V.:3000; IV Piggyback:50] Out: 195 [Urine:195] Intake/Output this shift: Total I/O In: 200 [I.V.:200] Out: -   Lab Results: Recent Labs    11/04/17 1144 11/05/17 0242 11/06/17 0248  WBC 15.2* 17.3* 12.4*  HGB 14.9 13.7 12.2  HCT 43.9 40.7 36.9  PLT 248 221 224   BMET Recent Labs    11/05/17 0242 11/05/17 1101 11/06/17 0248  NA 137 136 138  K 4.7 4.5 4.0  CL 110 108 109  CO2 19* 18* 19*  GLUCOSE 206* 187* 182*  BUN 43* 49* 61*  CREATININE 3.20* 3.55* 4.51*  CALCIUM 5.1* 4.8* 4.2*   LFT Recent Labs    11/06/17 0248  PROT 5.5*  ALBUMIN 2.8*  AST 50*  ALT 151*  ALKPHOS 75  BILITOT 1.9*   Dg Chest Port 1 View  Result Date: 11/06/2017 CLINICAL DATA:  Acute respiratory failure EXAM: PORTABLE CHEST 1 VIEW COMPARISON:  11/04/2017 FINDINGS: Hypoventilation with decreased lung volume. Increase in bibasilar atelectasis left greater than right. Probable left  pleural effusion. Pulmonary vascular congestion suggesting mild heart failure. IMPRESSION: Hypoventilation with increased atelectasis in lung bases left greater than right. Small left effusion. Progression of pulmonary vascular congestion suggesting mild fluid overload. Electronically Signed   By: Franchot Gallo M.D.   On: 11/06/2017 09:05   Dg Chest Port 1 View  Result Date: 11/04/2017 CLINICAL DATA:  Shortness of breath, weakness, and fever. EXAM: PORTABLE CHEST 1 VIEW COMPARISON:  Report from prior chest radiograph 10/19/2013 FINDINGS: The patient is rotated to the right on today's radiograph, reducing diagnostic sensitivity and specificity. Mild enlargement of the cardiopericardial silhouette. Prominence of mediastinal contour is probably from fatty mediastinal tissues based on comparison to the recent abdomen MRI. Low lung volumes are present, causing crowding of the pulmonary vasculature. Indistinct airspace opacity at the left lung base compatible with atelectasis or pneumonia. Similar indistinct density at the right lung base medially. Rightward tracheal deviation, partly rotation related IMPRESSION: 1. Left greater than right airspace opacities in the lung bases favoring atelectasis or pneumonia. 2. Low lung volumes are present, causing crowding of the pulmonary vasculature. 3. Mild enlargement of the cardiopericardial silhouette. Electronically Signed   By: Van Clines M.D.   On: 11/04/2017 14:27   Assessment / Plan: *57 year old female with severe gallstone pancreatitis. Necrosis seen on CT scan. CBD stone confirmed on MRCP.  LFT's are trending down.  ?  If she passed the stone.   *AKI in the setting of pancreatitis and volume depletion:  Cr continues to worsen.  -Need to continue aggressive hydration.  -Monitor CBC and CMP. -Continue Zosyn. -Renal is being consulted due to worsening renal function. -Will need ERCP once pancreatitis calms down. -Will consult surgery later once  pancreatitis is improved as she will also need cholecystectomy at some point.   LOS: 3 days   Laban Emperor. Zehr  11/06/2017, 9:11 AM  Pager number [phone]   Attending physician's note   I have taken an interval history, reviewed the chart and examined the patient. I agree with the Advanced Practitioner's note, impression and recommendations.  She has waves of abdominal pain associated with severe nausea and dry heaving, overall she feels abdominal pain is improving, not requiring as much pain medication Bilirubin is trending down 1.9 this morning from 5.3 yesterday.  AST, ALT and alk phos have also trended down.  She could have passed the cbd  stone vs decreased inflammation and periportal edema with decreased biliary compression  Renal function progressively worse with minimal urine output Will hold off ERCP for now given the severity of pancreatitis, lack of evidence to suggest cholangitis and also that LFT are improving. She is likely to have significant pancreatic necrosis and sequelae from it Will need cholecystectomy prior to discharge once the acute process resolves to prevent recurrent episodes of biliary pancreatitis which could potentially be life-threatening if she were to have another episode. Discussed case with Dr. Fuller Plan Appreciate supportive care by PCCM  K Denzil Magnuson, MD [phone] Mon-Fri 8a-5p [phone] after 5p, weekends, holidays

## 2017-11-06 NOTE — Consult Note (Signed)
Reason for Consult:AKI Referring Physician: Dr. Salley Hews Emma Washington is an 57 y.o. female.  HPI: 57 yr female with hx HTn, GERD, Renal stones (x3 , the last several yr ago). Developed fairly abupt abdm pain and went to UC and then ED and found to have GS pancreatitis with lipase over 10000.  No hx Gallstones. Was supposedly on lisinopril/HCTZ but has taken none in over 2 wk.  Has been on Ibuprofen erratically but took more with her abdm pain. Received MRCP with Gad. Has no incidence of low bps and is 11 L pos in fluids given.  No FH renal dz, allery to pineapple and Demerol. No rash or arthritis or other sx.  Nonsmoker.  Cr on admit 1.33 and now is 4.5.  Has recurrent hypocalcemia and had low phos after iv Ca.  Did have dark urine Constitutional: as above Eyes: negative Ears, nose, mouth, throat, and face: negative Respiratory: SOB now Cardiovascular: negative Gastrointestinal: as above Genitourinary:negative Integument/breast: negative Musculoskeletal:negative Neurological: negative Endocrine: negative Allergic/Immunologic: as above   Past Medical History:  Diagnosis Date  . Anemia   . GERD (gastroesophageal reflux disease)   . Hypertension   . Nephrolithiasis     Past Surgical History:  Procedure Laterality Date  . HAND SURGERY    . HERNIA REPAIR      Family History  Problem Relation Age of Onset  . Pancreatitis Brother     Social History:  reports that  has never smoked. she has never used smokeless tobacco. She reports that she does not drink alcohol or use drugs.  Allergies:  Allergies  Allergen Reactions  . Demerol Other (See Comments)    Severe hypotension  . Meperidine     Other reaction(s): Hypotension, Hypotension (ALLERGY/intolerance)  . Other Anaphylaxis    Pineapple  . Pineapple Swelling and Anaphylaxis    Medications:  I have reviewed the patient's current medications. Prior to Admission:  Medications Prior to Admission  Medication Sig Dispense  Refill Last Dose  . ibuprofen (ADVIL,MOTRIN) 200 MG tablet Take 1,000 mg by mouth once as needed. For pain   11/01/2017 at Unknown time  . lisinopril-hydrochlorothiazide (PRINZIDE,ZESTORETIC) 20-25 MG tablet Take 1 tablet by mouth daily as needed (hypertension).    Past Week at Unknown time  . promethazine (PHENERGAN) 25 MG tablet Take 25 mg by mouth every 6 (six) hours as needed for nausea or vomiting.   11/02/2017 at Unknown time    Results for orders placed or performed during the hospital encounter of 11/20/2017 (from the past 48 hour(s))  Basic metabolic panel Once     Status: Abnormal   Collection Time: 11/04/17  3:48 PM  Result Value Ref Range   Sodium 136 135 - 145 mmol/L   Potassium 4.2 3.5 - 5.1 mmol/L   Chloride 108 101 - 111 mmol/L   CO2 20 (L) 22 - 32 mmol/L   Glucose, Bld 225 (H) 65 - 99 mg/dL   BUN 37 (H) 6 - 20 mg/dL   Creatinine, Ser 2.49 (H) 0.44 - 1.00 mg/dL   Calcium 5.7 (LL) 8.9 - 10.3 mg/dL    Comment: CRITICAL RESULT CALLED TO, READ BACK BY AND VERIFIED WITH: HOLDERNESS,H. RN '@1731'  ON 01.15.19 BY COHEN,K    GFR calc non Af Amer 21 (L) >60 mL/min   GFR calc Af Amer 24 (L) >60 mL/min    Comment: (NOTE) The eGFR has been calculated using the CKD EPI equation. This calculation has not been validated in all clinical  situations. eGFR's persistently <60 mL/min signify possible Chronic Kidney Disease.    Anion gap 8 5 - 15  Glucose, capillary     Status: Abnormal   Collection Time: 11/04/17  4:28 PM  Result Value Ref Range   Glucose-Capillary 207 (H) 65 - 99 mg/dL   Comment 1 Notify RN    Comment 2 Document in Chart   Glucose, capillary     Status: Abnormal   Collection Time: 11/04/17  7:31 PM  Result Value Ref Range   Glucose-Capillary 193 (H) 65 - 99 mg/dL  Glucose, capillary     Status: Abnormal   Collection Time: 11/05/17 12:04 AM  Result Value Ref Range   Glucose-Capillary 190 (H) 65 - 99 mg/dL  CBC     Status: Abnormal   Collection Time: 11/05/17  2:42 AM   Result Value Ref Range   WBC 17.3 (H) 4.0 - 10.5 K/uL   RBC 4.54 3.87 - 5.11 MIL/uL   Hemoglobin 13.7 12.0 - 15.0 g/dL   HCT 40.7 36.0 - 46.0 %   MCV 89.6 78.0 - 100.0 fL   MCH 30.2 26.0 - 34.0 pg   MCHC 33.7 30.0 - 36.0 g/dL   RDW 15.6 (H) 11.5 - 15.5 %   Platelets 221 150 - 400 K/uL  Comprehensive metabolic panel     Status: Abnormal   Collection Time: 11/05/17  2:42 AM  Result Value Ref Range   Sodium 137 135 - 145 mmol/L   Potassium 4.7 3.5 - 5.1 mmol/L   Chloride 110 101 - 111 mmol/L   CO2 19 (L) 22 - 32 mmol/L   Glucose, Bld 206 (H) 65 - 99 mg/dL   BUN 43 (H) 6 - 20 mg/dL   Creatinine, Ser 3.20 (H) 0.44 - 1.00 mg/dL   Calcium 5.1 (LL) 8.9 - 10.3 mg/dL    Comment: CRITICAL RESULT CALLED TO, READ BACK BY AND VERIFIED WITH: SCOTT,B RN 1.16.19 '@0413'  ZANDO,C    Total Protein 5.4 (L) 6.5 - 8.1 g/dL   Albumin 2.8 (L) 3.5 - 5.0 g/dL   AST 92 (H) 15 - 41 U/L   ALT 270 (H) 14 - 54 U/L   Alkaline Phosphatase 86 38 - 126 U/L   Total Bilirubin 5.3 (H) 0.3 - 1.2 mg/dL   GFR calc non Af Amer 15 (L) >60 mL/min   GFR calc Af Amer 18 (L) >60 mL/min    Comment: (NOTE) The eGFR has been calculated using the CKD EPI equation. This calculation has not been validated in all clinical situations. eGFR's persistently <60 mL/min signify possible Chronic Kidney Disease.    Anion gap 8 5 - 15  Lipase, blood     Status: Abnormal   Collection Time: 11/05/17  2:42 AM  Result Value Ref Range   Lipase 850 (H) 11 - 51 U/L    Comment: RESULTS CONFIRMED BY MANUAL DILUTION  Hemoglobin A1c     Status: None   Collection Time: 11/05/17  2:42 AM  Result Value Ref Range   Hgb A1c MFr Bld 5.4 4.8 - 5.6 %    Comment: (NOTE) Pre diabetes:          5.7%-6.4% Diabetes:              >6.4% Glycemic control for   <7.0% adults with diabetes    Mean Plasma Glucose 108.28 mg/dL    Comment: Performed at [facility] [address], [place], [place] [address]  Lactate dehydrogenase  Status:  Abnormal   Collection Time: 11/05/17  2:42 AM  Result Value Ref Range   LDH 536 (H) 98 - 192 U/L  Magnesium     Status: None   Collection Time: 11/05/17  2:42 AM  Result Value Ref Range   Magnesium 1.9 1.7 - 2.4 mg/dL  Glucose, capillary     Status: Abnormal   Collection Time: 11/05/17  3:14 AM  Result Value Ref Range   Glucose-Capillary 199 (H) 65 - 99 mg/dL  Glucose, capillary     Status: Abnormal   Collection Time: 11/05/17  8:00 AM  Result Value Ref Range   Glucose-Capillary 176 (H) 65 - 99 mg/dL   Comment 1 Notify RN    Comment 2 Document in Chart   Basic metabolic panel     Status: Abnormal   Collection Time: 11/05/17 11:01 AM  Result Value Ref Range   Sodium 136 135 - 145 mmol/L   Potassium 4.5 3.5 - 5.1 mmol/L   Chloride 108 101 - 111 mmol/L   CO2 18 (L) 22 - 32 mmol/L   Glucose, Bld 187 (H) 65 - 99 mg/dL   BUN 49 (H) 6 - 20 mg/dL   Creatinine, Ser 3.55 (H) 0.44 - 1.00 mg/dL   Calcium 4.8 (LL) 8.9 - 10.3 mg/dL    Comment: CRITICAL RESULT CALLED TO, READ BACK BY AND VERIFIED WITH: S.DILLON AT 1224 ON 11/05/17 BY N.THOMPSON    GFR calc non Af Amer 13 (L) >60 mL/min   GFR calc Af Amer 16 (L) >60 mL/min    Comment: (NOTE) The eGFR has been calculated using the CKD EPI equation. This calculation has not been validated in all clinical situations. eGFR's persistently <60 mL/min signify possible Chronic Kidney Disease.    Anion gap 10 5 - 15  Glucose, capillary     Status: Abnormal   Collection Time: 11/05/17 12:59 PM  Result Value Ref Range   Glucose-Capillary 155 (H) 65 - 99 mg/dL   Comment 1 Notify RN    Comment 2 Document in Chart   Glucose, capillary     Status: Abnormal   Collection Time: 11/05/17  4:13 PM  Result Value Ref Range   Glucose-Capillary 164 (H) 65 - 99 mg/dL   Comment 1 Notify RN    Comment 2 Document in Chart   Glucose, capillary     Status: Abnormal   Collection Time: 11/05/17 11:54 PM  Result Value Ref Range   Glucose-Capillary 175 (H) 65 -  99 mg/dL   Comment 1 Notify RN   Comprehensive metabolic panel     Status: Abnormal   Collection Time: 11/06/17  2:48 AM  Result Value Ref Range   Sodium 138 135 - 145 mmol/L   Potassium 4.0 3.5 - 5.1 mmol/L   Chloride 109 101 - 111 mmol/L   CO2 19 (L) 22 - 32 mmol/L   Glucose, Bld 182 (H) 65 - 99 mg/dL   BUN 61 (H) 6 - 20 mg/dL   Creatinine, Ser 4.51 (H) 0.44 - 1.00 mg/dL   Calcium 4.2 (LL) 8.9 - 10.3 mg/dL    Comment: CRITICAL RESULT CALLED TO, READ BACK BY AND VERIFIED WITH: R DAVIS RN 0440 11/06/17 A NAVARRO    Total Protein 5.5 (L) 6.5 - 8.1 g/dL   Albumin 2.8 (L) 3.5 - 5.0 g/dL   AST 50 (H) 15 - 41 U/L   ALT 151 (H) 14 - 54 U/L   Alkaline Phosphatase 75 38 - 126  U/L   Total Bilirubin 1.9 (H) 0.3 - 1.2 mg/dL   GFR calc non Af Amer 10 (L) >60 mL/min   GFR calc Af Amer 12 (L) >60 mL/min    Comment: (NOTE) The eGFR has been calculated using the CKD EPI equation. This calculation has not been validated in all clinical situations. eGFR's persistently <60 mL/min signify possible Chronic Kidney Disease.    Anion gap 10 5 - 15  Magnesium     Status: None   Collection Time: 11/06/17  2:48 AM  Result Value Ref Range   Magnesium 1.7 1.7 - 2.4 mg/dL  Phosphorus     Status: Abnormal   Collection Time: 11/06/17  2:48 AM  Result Value Ref Range   Phosphorus 1.9 (L) 2.5 - 4.6 mg/dL  CBC     Status: Abnormal   Collection Time: 11/06/17  2:48 AM  Result Value Ref Range   WBC 12.4 (H) 4.0 - 10.5 K/uL   RBC 4.09 3.87 - 5.11 MIL/uL   Hemoglobin 12.2 12.0 - 15.0 g/dL   HCT 36.9 36.0 - 46.0 %   MCV 90.2 78.0 - 100.0 fL   MCH 29.8 26.0 - 34.0 pg   MCHC 33.1 30.0 - 36.0 g/dL   RDW 16.2 (H) 11.5 - 15.5 %   Platelets 224 150 - 400 K/uL  Glucose, capillary     Status: Abnormal   Collection Time: 11/06/17  3:20 AM  Result Value Ref Range   Glucose-Capillary 170 (H) 65 - 99 mg/dL   Comment 1 Notify RN   Glucose, capillary     Status: Abnormal   Collection Time: 11/06/17  8:04 AM   Result Value Ref Range   Glucose-Capillary 149 (H) 65 - 99 mg/dL   Comment 1 Notify RN    Comment 2 Document in Chart   Glucose, capillary     Status: Abnormal   Collection Time: 11/06/17 11:22 AM  Result Value Ref Range   Glucose-Capillary 146 (H) 65 - 99 mg/dL   Comment 1 Notify RN    Comment 2 Document in Chart     Dg Chest Port 1 View  Result Date: 11/06/2017 CLINICAL DATA:  Acute respiratory failure EXAM: PORTABLE CHEST 1 VIEW COMPARISON:  11/04/2017 FINDINGS: Hypoventilation with decreased lung volume. Increase in bibasilar atelectasis left greater than right. Probable left pleural effusion. Pulmonary vascular congestion suggesting mild heart failure. IMPRESSION: Hypoventilation with increased atelectasis in lung bases left greater than right. Small left effusion. Progression of pulmonary vascular congestion suggesting mild fluid overload. Electronically Signed   By: Franchot Gallo M.D.   On: 11/06/2017 09:05   Dg Chest Port 1 View  Result Date: 11/04/2017 CLINICAL DATA:  Shortness of breath, weakness, and fever. EXAM: PORTABLE CHEST 1 VIEW COMPARISON:  Report from prior chest radiograph 10/19/2013 FINDINGS: The patient is rotated to the right on today's radiograph, reducing diagnostic sensitivity and specificity. Mild enlargement of the cardiopericardial silhouette. Prominence of mediastinal contour is probably from fatty mediastinal tissues based on comparison to the recent abdomen MRI. Low lung volumes are present, causing crowding of the pulmonary vasculature. Indistinct airspace opacity at the left lung base compatible with atelectasis or pneumonia. Similar indistinct density at the right lung base medially. Rightward tracheal deviation, partly rotation related IMPRESSION: 1. Left greater than right airspace opacities in the lung bases favoring atelectasis or pneumonia. 2. Low lung volumes are present, causing crowding of the pulmonary vasculature. 3. Mild enlargement of the  cardiopericardial silhouette. Electronically Signed  By: Van Clines M.D.   On: 11/04/2017 14:27    ROS Blood pressure 126/81, pulse (!) 109, temperature 98 F (36.7 C), temperature source Oral, resp. rate (!) 30, height '5\' 9"'  (1.753 m), weight (!) 143 kg (315 lb 4.1 oz), last menstrual period 11/07/2011, SpO2 93 %. Physical Exam Physical Examination: General appearance - obese, uncomfortable, dyspneic Mental status - alert, oriented to person, place, and time, uncomfortable Eyes - pupils equal and reactive, extraocular eye movements intact, funduscopic exam normal, discs flat and sharp Mouth - reddened, dry,  Neck - adenopathy noted PCL Lymphatics - posterior cervical nodes Chest - decreased, occ rhonchi Heart - S1 and S2 normal Abdomen - mod distension, not tight, few bs Musculoskeletal - no joint tenderness, deformity or swelling Extremities - peripheral pulses normal, no pedal edema, no clubbing or cyanosis Skin - normal coloration and turgor, no rashes, no suspicious skin lesions noted  Assessment/Plan: 1 AKI normal urine on admit.  Suspect this is toxic.  No real hemodynamic changes but clearly 3rd spacing.  No clinical evidence comparment syndrome either and initial U/S no obstruction.  This appears toxic from Gadolinium and poss role Ibuprofen but not classic for NSAID.  Will repeat U/S, look at sediment and make sure no IC GN 2 Obesity 3 Hypertension: not taking med 4. Gallstone pancreatitis, serious and pancreas may become necrotic 5. Mod resp compromise, follow closely.  Would not cont to push fluid with good bps, and resp compromise 6 Hx Renal stones P UA, Urine chem, U/s, hold ivf, diff,   Elantra Caprara 11/06/2017, 12:14 PM

## 2017-11-06 NOTE — Progress Notes (Signed)
eLink Physician-Brief Progress Note Patient Name: [name] DOB: [birth year] MRN: [id number]   Date of Service  11/06/2017  HPI/Events of Note  Hypocalcemia Hypophos Hypomag  eICU Interventions  Calcium, Phos, and mag replaced     Intervention Category Intermediate Interventions: Electrolyte abnormality - evaluation and management  DETERDING,ELIZABETH 11/06/2017, 4:56 AM

## 2017-11-07 ENCOUNTER — Inpatient Hospital Stay (HOSPITAL_COMMUNITY): Payer: BLUE CROSS/BLUE SHIELD

## 2017-11-07 DIAGNOSIS — J9601 Acute respiratory failure with hypoxia: Secondary | ICD-10-CM

## 2017-11-07 DIAGNOSIS — N179 Acute kidney failure, unspecified: Secondary | ICD-10-CM

## 2017-11-07 LAB — CBC
HCT: 36.6 % (ref 36.0–46.0)
HEMATOCRIT: 33.4 % — AB (ref 36.0–46.0)
HEMOGLOBIN: 12 g/dL (ref 12.0–15.0)
HEMOGLOBIN: 11 g/dL — AB (ref 12.0–15.0)
MCH: 30 pg (ref 26.0–34.0)
MCH: 30.3 pg (ref 26.0–34.0)
MCHC: 32.8 g/dL (ref 30.0–36.0)
MCHC: 32.9 g/dL (ref 30.0–36.0)
MCV: 91.5 fL (ref 78.0–100.0)
MCV: 92 fL (ref 78.0–100.0)
Platelets: 241 10*3/uL (ref 150–400)
Platelets: 244 10*3/uL (ref 150–400)
RBC: 4 MIL/uL (ref 3.87–5.11)
RBC: 3.63 MIL/uL — AB (ref 3.87–5.11)
RDW: 17.2 % — ABNORMAL HIGH (ref 11.5–15.5)
RDW: 17.3 % — ABNORMAL HIGH (ref 11.5–15.5)
WBC: 11.7 10*3/uL — ABNORMAL HIGH (ref 4.0–10.5)
WBC: 10.4 10*3/uL (ref 4.0–10.5)

## 2017-11-07 LAB — RENAL FUNCTION PANEL
ANION GAP: 12 (ref 5–15)
Albumin: 2.2 g/dL — ABNORMAL LOW (ref 3.5–5.0)
BUN: 83 mg/dL — ABNORMAL HIGH (ref 6–20)
CHLORIDE: 111 mmol/L (ref 101–111)
CO2: 18 mmol/L — ABNORMAL LOW (ref 22–32)
Creatinine, Ser: 5.42 mg/dL — ABNORMAL HIGH (ref 0.44–1.00)
GFR, EST AFRICAN AMERICAN: 9 mL/min — AB (ref >60)
GFR, EST NON AFRICAN AMERICAN: 8 mL/min — AB (ref >60)
Glucose, Bld: 157 mg/dL — ABNORMAL HIGH (ref 65–99)
Phosphorus: 4.6 mg/dL (ref 2.5–4.6)
Potassium: 4.5 mmol/L (ref 3.5–5.1)
SODIUM: 141 mmol/L (ref 135–145)

## 2017-11-07 LAB — BASIC METABOLIC PANEL
ANION GAP: 11 (ref 5–15)
BUN: 80 mg/dL — ABNORMAL HIGH (ref 6–20)
CO2: 18 mmol/L — ABNORMAL LOW (ref 22–32)
Chloride: 110 mmol/L (ref 101–111)
Creatinine, Ser: 5.16 mg/dL — ABNORMAL HIGH (ref 0.44–1.00)
GFR, EST AFRICAN AMERICAN: 10 mL/min — AB (ref >60)
GFR, EST NON AFRICAN AMERICAN: 9 mL/min — AB (ref >60)
GLUCOSE: 164 mg/dL — AB (ref 65–99)
POTASSIUM: 4.7 mmol/L (ref 3.5–5.1)
Sodium: 139 mmol/L (ref 135–145)

## 2017-11-07 LAB — BLOOD GAS, ARTERIAL
ACID-BASE DEFICIT: 10.7 mmol/L — AB (ref 0.0–2.0)
Acid-base deficit: 11.4 mmol/L — ABNORMAL HIGH (ref 0.0–2.0)
BICARBONATE: 16.9 mmol/L — AB (ref 20.0–28.0)
Bicarbonate: 19 mmol/L — ABNORMAL LOW (ref 20.0–28.0)
DRAWN BY: 308601
DRAWN BY: 232811
FIO2: 100
FIO2: 100
LHR: 25 {breaths}/min
MECHVT: 400 mL
MECHVT: 450 mL
O2 SAT: 93.4 %
O2 Saturation: 98.5 %
PATIENT TEMPERATURE: 98
PCO2 ART: 65.4 mmHg — AB (ref 32.0–48.0)
PEEP/CPAP: 5 cmH2O
PEEP: 5 cmH2O
PO2 ART: 88.1 mmHg (ref 83.0–108.0)
PO2 ART: 149 mmHg — AB (ref 83.0–108.0)
Patient temperature: 98
RATE: 16 resp/min
pCO2 arterial: 45.7 mmHg (ref 32.0–48.0)
pH, Arterial: 7.089 — CL (ref 7.350–7.450)
pH, Arterial: 7.191 — CL (ref 7.350–7.450)

## 2017-11-07 LAB — GLUCOSE, CAPILLARY
GLUCOSE-CAPILLARY: 156 mg/dL — AB (ref 65–99)
GLUCOSE-CAPILLARY: 165 mg/dL — AB (ref 65–99)
Glucose-Capillary: 156 mg/dL — ABNORMAL HIGH (ref 65–99)
Glucose-Capillary: 161 mg/dL — ABNORMAL HIGH (ref 65–99)
Glucose-Capillary: 148 mg/dL — ABNORMAL HIGH (ref 65–99)
Glucose-Capillary: 168 mg/dL — ABNORMAL HIGH (ref 65–99)

## 2017-11-07 LAB — COMPREHENSIVE METABOLIC PANEL
ALK PHOS: 73 U/L (ref 38–126)
ALT: 106 U/L — AB (ref 14–54)
ANION GAP: 11 (ref 5–15)
AST: 49 U/L — ABNORMAL HIGH (ref 15–41)
Albumin: 2.6 g/dL — ABNORMAL LOW (ref 3.5–5.0)
BUN: 67 mg/dL — ABNORMAL HIGH (ref 6–20)
CO2: 18 mmol/L — ABNORMAL LOW (ref 22–32)
CREATININE: 4.59 mg/dL — AB (ref 0.44–1.00)
Chloride: 110 mmol/L (ref 101–111)
GFR calc Af Amer: 11 mL/min — ABNORMAL LOW (ref >60)
GFR, EST NON AFRICAN AMERICAN: 10 mL/min — AB (ref >60)
Glucose, Bld: 160 mg/dL — ABNORMAL HIGH (ref 65–99)
Potassium: 4.4 mmol/L (ref 3.5–5.1)
Sodium: 139 mmol/L (ref 135–145)
TOTAL PROTEIN: 5.8 g/dL — AB (ref 6.5–8.1)
Total Bilirubin: 0.9 mg/dL (ref 0.3–1.2)

## 2017-11-07 LAB — TRIGLYCERIDES: TRIGLYCERIDES: 332 mg/dL — AB (ref <150)

## 2017-11-07 LAB — MAGNESIUM: Magnesium: 2.2 mg/dL (ref 1.7–2.4)

## 2017-11-07 LAB — PHOSPHORUS: PHOSPHORUS: 4.7 mg/dL — AB (ref 2.5–4.6)

## 2017-11-07 LAB — URIC ACID: Uric Acid, Serum: 7.8 mg/dL — ABNORMAL HIGH (ref 2.3–6.6)

## 2017-11-07 MED ORDER — PIPERACILLIN-TAZOBACTAM 3.375 G IVPB 30 MIN
3.3750 g | Freq: Four times a day (QID) | INTRAVENOUS | Status: DC
  Administered 2017-11-07 – 2017-11-09 (×6): 3.375 g via INTRAVENOUS
  Filled 2017-11-07 (×13): qty 50

## 2017-11-07 MED ORDER — VITAL HIGH PROTEIN PO LIQD
1000.0000 mL | ORAL | Status: DC
  Administered 2017-11-07 – 2017-11-08 (×2): 1000 mL
  Filled 2017-11-07 (×3): qty 1000

## 2017-11-07 MED ORDER — DOCUSATE SODIUM 50 MG/5ML PO LIQD: 100.0000 mg | Freq: Two times a day (BID) | ORAL | Status: DC | PRN

## 2017-11-07 MED ORDER — MIDAZOLAM HCL 2 MG/2ML IJ SOLN
INTRAMUSCULAR | Status: AC
  Administered 2017-11-07: 2 mg
  Filled 2017-11-07: qty 2

## 2017-11-07 MED ORDER — LORAZEPAM 2 MG/ML IJ SOLN
1.0000 mg | Freq: Once | INTRAMUSCULAR | Status: AC
Start: 2017-11-07 — End: 2017-11-07
  Administered 2017-11-07: 1 mg via INTRAVENOUS
  Filled 2017-11-07: qty 1

## 2017-11-07 MED ORDER — PRISMASOL BGK 4/2.5 32-4-2.5 MEQ/L IV SOLN
INTRAVENOUS | Status: DC
  Administered 2017-11-07 – 2017-11-12 (×18): via INTRAVENOUS_CENTRAL
  Filled 2017-11-07 (×19): qty 5000

## 2017-11-07 MED ORDER — SODIUM CHLORIDE 0.9 % FOR CRRT
INTRAVENOUS_CENTRAL | Status: DC | PRN
  Filled 2017-11-07: qty 1000

## 2017-11-07 MED ORDER — HEPARIN SODIUM (PORCINE) 5000 UNIT/ML IJ SOLN
5000.0000 [IU] | Freq: Three times a day (TID) | INTRAMUSCULAR | Status: DC
  Administered 2017-11-07: 5000 [IU] via SUBCUTANEOUS
  Filled 2017-11-07: qty 1

## 2017-11-07 MED ORDER — PRO-STAT SUGAR FREE PO LIQD
60.0000 mL | Freq: Three times a day (TID) | ORAL | Status: DC
Start: 2017-11-07 — End: 2017-11-13
  Administered 2017-11-07 – 2017-11-10 (×6): 60 mL
  Filled 2017-11-07 (×6): qty 60

## 2017-11-07 MED ORDER — MIDAZOLAM HCL 2 MG/2ML IJ SOLN
2.0000 mg | INTRAMUSCULAR | Status: DC | PRN
  Administered 2017-11-08: 2 mg via INTRAVENOUS
  Filled 2017-11-07 (×2): qty 2

## 2017-11-07 MED ORDER — PROPOFOL 1000 MG/100ML IV EMUL
0.0000 ug/kg/min | INTRAVENOUS | Status: DC
  Administered 2017-11-07: 30 ug/kg/min via INTRAVENOUS
  Administered 2017-11-07: 03:00:00 via INTRAVENOUS
  Filled 2017-11-07: qty 100

## 2017-11-07 MED ORDER — PRISMASOL BGK 4/2.5 32-4-2.5 MEQ/L IV SOLN
INTRAVENOUS | Status: DC
  Administered 2017-11-07 – 2017-11-12 (×14): via INTRAVENOUS_CENTRAL
  Filled 2017-11-07 (×17): qty 5000

## 2017-11-07 MED ORDER — HEPARIN SODIUM (PORCINE) 1000 UNIT/ML IJ SOLN
3000.0000 [IU] | Freq: Once | INTRAMUSCULAR | Status: AC
  Administered 2017-11-07: 2.4 [IU] via INTRAVENOUS
  Filled 2017-11-07: qty 3

## 2017-11-07 MED ORDER — PIPERACILLIN-TAZOBACTAM 3.375 G IVPB: 3.3750 g | Freq: Four times a day (QID) | INTRAVENOUS | Status: DC

## 2017-11-07 MED ORDER — SODIUM CHLORIDE 0.9 % IV SOLN
2.0000 g | Freq: Once | INTRAVENOUS | Status: AC
  Administered 2017-11-07: 2 g via INTRAVENOUS
  Filled 2017-11-07: qty 20

## 2017-11-07 MED ORDER — PROPOFOL 1000 MG/100ML IV EMUL
INTRAVENOUS | Status: AC
  Filled 2017-11-07: qty 100

## 2017-11-07 MED ORDER — SODIUM CHLORIDE 0.9 % IV SOLN
25.0000 ug/h | INTRAVENOUS | Status: DC
  Administered 2017-11-07: 50 ug/h via INTRAVENOUS
  Administered 2017-11-08: 175 ug/h via INTRAVENOUS
  Administered 2017-11-08: 200 ug/h via INTRAVENOUS
  Administered 2017-11-08: 250 ug/h via INTRAVENOUS
  Administered 2017-11-09: 275 ug/h via INTRAVENOUS
  Filled 2017-11-07 (×4): qty 50

## 2017-11-07 MED ORDER — PRISMASOL BGK 4/2.5 32-4-2.5 MEQ/L IV SOLN
INTRAVENOUS | Status: DC
  Administered 2017-11-07 – 2017-11-12 (×31): via INTRAVENOUS_CENTRAL
  Filled 2017-11-07 (×38): qty 5000

## 2017-11-07 MED ORDER — MIDAZOLAM HCL 2 MG/2ML IJ SOLN
2.0000 mg | INTRAMUSCULAR | Status: DC | PRN
  Administered 2017-11-07 – 2017-11-09 (×9): 2 mg via INTRAVENOUS
  Filled 2017-11-07 (×8): qty 2

## 2017-11-07 MED ORDER — FENTANYL BOLUS VIA INFUSION
50.0000 ug | INTRAVENOUS | Status: DC | PRN
  Administered 2017-11-07 – 2017-11-09 (×12): 50 ug via INTRAVENOUS
  Filled 2017-11-07 (×2): qty 50

## 2017-11-07 MED ORDER — SODIUM CHLORIDE 0.9 % IJ SOLN
250.0000 [IU]/h | INTRAMUSCULAR | Status: DC
  Administered 2017-11-07: 250 [IU]/h via INTRAVENOUS_CENTRAL
  Administered 2017-11-08: 1650 [IU]/h via INTRAVENOUS_CENTRAL
  Administered 2017-11-08: 1300 [IU]/h via INTRAVENOUS_CENTRAL
  Administered 2017-11-08 (×2): 1000 [IU]/h via INTRAVENOUS_CENTRAL
  Administered 2017-11-08: 850 [IU]/h via INTRAVENOUS_CENTRAL
  Administered 2017-11-09: 2000 [IU]/h via INTRAVENOUS_CENTRAL
  Administered 2017-11-09 (×2): 2150 [IU]/h via INTRAVENOUS_CENTRAL
  Administered 2017-11-09: 1950 [IU]/h via INTRAVENOUS_CENTRAL
  Administered 2017-11-09: 1550 [IU]/h via INTRAVENOUS_CENTRAL
  Administered 2017-11-10: 2300 [IU]/h via INTRAVENOUS_CENTRAL
  Administered 2017-11-10: 2150 [IU]/h via INTRAVENOUS_CENTRAL
  Administered 2017-11-10 (×2): 2300 [IU]/h via INTRAVENOUS_CENTRAL
  Administered 2017-11-10: 2650 [IU]/h via INTRAVENOUS_CENTRAL
  Administered 2017-11-11 – 2017-11-12 (×8): 2500 [IU]/h via INTRAVENOUS_CENTRAL
  Filled 2017-11-07 (×24): qty 2

## 2017-11-07 MED ORDER — BISACODYL 10 MG RE SUPP
10.0000 mg | Freq: Every day | RECTAL | Status: DC | PRN
  Administered 2017-11-10: 10 mg via RECTAL
  Filled 2017-11-07: qty 1

## 2017-11-07 MED ORDER — HEPARIN SODIUM (PORCINE) 1000 UNIT/ML DIALYSIS
1000.0000 [IU] | INTRAMUSCULAR | Status: DC | PRN
  Filled 2017-11-07 (×3): qty 6

## 2017-11-07 MED ORDER — PHENYLEPHRINE HCL-NACL 10-0.9 MG/250ML-% IV SOLN: 0.0000 ug/min | INTRAVENOUS | Status: DC

## 2017-11-07 MED ORDER — FENTANYL CITRATE (PF) 100 MCG/2ML IJ SOLN: 50.0000 ug | Freq: Once | INTRAMUSCULAR | Status: DC

## 2017-11-07 MED ORDER — MIDAZOLAM HCL 2 MG/2ML IJ SOLN
INTRAMUSCULAR | Status: AC
  Administered 2017-11-07: 2 mg
  Filled 2017-11-07: qty 4

## 2017-11-07 MED ORDER — ALBUTEROL SULFATE (2.5 MG/3ML) 0.083% IN NEBU
2.5000 mg | INHALATION_SOLUTION | RESPIRATORY_TRACT | Status: DC | PRN
  Administered 2017-11-07 – 2017-11-18 (×2): 2.5 mg via RESPIRATORY_TRACT
  Filled 2017-11-07: qty 3

## 2017-11-07 MED ORDER — HEPARIN BOLUS VIA INFUSION (CRRT)
1000.0000 [IU] | INTRAVENOUS | Status: DC | PRN
  Administered 2017-11-07 – 2017-11-10 (×2): 1000 [IU] via INTRAVENOUS_CENTRAL
  Filled 2017-11-07 (×3): qty 1000

## 2017-11-07 MED ORDER — PIPERACILLIN-TAZOBACTAM IN DEX 2-0.25 GM/50ML IV SOLN
2.2500 g | Freq: Three times a day (TID) | INTRAVENOUS | Status: DC
  Filled 2017-11-07: qty 50

## 2017-11-07 NOTE — Progress Notes (Signed)
Date: November 07, 2017 Marcelle SmilingRhonda Davis, BSN, [place]RN3, [place]M [phone] Chart and notes review for patient progress and needs./ Intubated pm of [id number] Will follow for case management and discharge needs. No cm or discharge needs present at time of this review. Next review date: [id number]

## 2017-11-07 NOTE — Procedures (Signed)
Intubation Procedure Note Emma Washington [id number] 05-Oct-1961  Procedure: Intubation Indications: Respiratory insufficiency  Procedure Details Consent: Risks of procedure as well as the alternatives and risks of each were explained to the (patient/caregiver).  Consent for procedure obtained. Time Out: Verified patient identification, verified procedure, site/side was marked, verified correct patient position, special equipment/implants available, medications/allergies/relevent history reviewed, required imaging and test results available.  Performed  Maximum sterile technique was used including gloves, hand hygiene and mask.  MAC and 4    Evaluation Hemodynamic Status: BP stable throughout; O2 sats: transiently fell during during procedure Patient's Current Condition: stable Complications: No apparent complications Patient did tolerate procedure well. Chest X-ray ordered to verify placement.  CXR: pending.   Rise Paganini Scatliffe 11/07/2017

## 2017-11-07 NOTE — Progress Notes (Signed)
CRITICAL VALUE ALERT  Critical Value:  Calcium less than 4.0  Date & Time Notied:  11/07/17 at 1025  Provider Notified: NP Canary BrimBrandi Ollis   Orders Received/Actions taken:received no new orders at this time.

## 2017-11-07 NOTE — Progress Notes (Signed)
CVVHDF initiated at 1851 without complication

## 2017-11-07 NOTE — Progress Notes (Signed)
Subjective: Interval History: entub, sedated, bp low with sedation.  Objective: Vital signs in last 24 hours: Temp:  [97.7 F (36.5 C)-99.2 F (37.3 C)] 99.2 F (37.3 C) (01/18 0800) Pulse Rate:  [103-134] 103 (01/18 0800) Resp:  [16-37] 25 (01/18 0800) BP: (89-152)/(51-126) 104/71 (01/18 0800) SpO2:  [88 %-98 %] 97 % (01/18 0800) FiO2 (%):  [100 %] 100 % (01/18 1020) Weight change:   Intake/Output from previous day: 01/17 0701 - 01/18 0700 In: 1966.3 [I.V.:1716.3; IV Piggyback:250] Out: 475 [Urine:475] Intake/Output this shift: Total I/O In: 158.3 [I.V.:108.3; IV Piggyback:50] Out: 265 [Urine:265]  General appearance: moderately obese, pale and on vent, sedated Resp: rales bibasilar and rhonchi bibasilar Cardio: S1, S2 normal and systolic murmur: holosystolic 2/6, blowing at apex GI: distended, not tight,  no bs. Extremities: edema 3+  Lab Results: Recent Labs    11/07/17 0334 11/07/17 0933  WBC 11.7* 10.4  HGB 12.0 11.0*  HCT 36.6 33.4*  PLT 241 244   BMET:  Recent Labs    11/07/17 0334 11/07/17 0933  NA 139 139  K 4.4 4.7  CL 110 110  CO2 18* 18*  GLUCOSE 160* 164*  BUN 67* 80*  CREATININE 4.59* 5.16*  CALCIUM <4.0* <4.0*   No results for input(s): PTH in the last 72 hours. Iron Studies: No results for input(s): IRON, TIBC, TRANSFERRIN, FERRITIN in the last 72 hours.  Studies/Results: US Renal  Result Date: 11/06/2017 CLINICAL DATA:  Acute kidney injury. EXAM: RENAL / URINARY TRACT ULTRASOUND COMPLETE COMPARISON:  None. FINDINGS: Right Kidney: Length: 12.8 cm. Echogenicity within normal limits. No mass or hydronephrosis visualized. Left Kidney: Length: 11.1 cm. Echogenicity within normal limits. No mass or hydronephrosis visualized. Bladder: Not visualize. Other: Small volume pelvic ascites. IMPRESSION: 1. Normal renal ultrasound. Electronically Signed   By: Elige Ko   On: 11/06/2017 15:15   Dg Chest Port 1 View  Result Date: 11/07/2017 CLINICAL  DATA:  57 y/o  F; post intubation, ET tube position. EXAM: PORTABLE CHEST 1 VIEW COMPARISON:  11/06/2017 chest radiograph FINDINGS: Low lung volumes. Stable enlarged cardiac silhouette given projection and technique. Increased diffuse hazy airspace opacities and small effusions probably representing pulmonary edema. Endotracheal tube is 1.4 cm from carina. No acute osseous abnormality identified. IMPRESSION: Endotracheal tube 1.4 cm from carina. Increased hazy lung opacities compatible with pulmonary edema and small effusions bilaterally. Electronically Signed   By: Mitzi Hansen M.D.   On: 11/07/2017 04:24   Dg Chest Port 1 View  Result Date: 11/06/2017 CLINICAL DATA:  Acute respiratory failure EXAM: PORTABLE CHEST 1 VIEW COMPARISON:  11/04/2017 FINDINGS: Hypoventilation with decreased lung volume. Increase in bibasilar atelectasis left greater than right. Probable left pleural effusion. Pulmonary vascular congestion suggesting mild heart failure. IMPRESSION: Hypoventilation with increased atelectasis in lung bases left greater than right. Small left effusion. Progression of pulmonary vascular congestion suggesting mild fluid overload. Electronically Signed   By: Marlan Palau M.D.   On: 11/06/2017 09:05    I have reviewed the patient's current medications.  Assessment/Plan: 1 AKI has some urine, not much and no function. Acidemic, k ok. Rising solute. Low FENA c/w ATN and low perfusion . RBC/WBC in urine supect toxic injury with GAd, NSAID.  Low bps now.  If not better later today, CRRt 2 VDRF  Per CCM 3 Pancreatitis per GI, bowel rest 4 Anemia P check chem this pm and urine,, if not better CRRT. Vent, NG    LOS: 4 days   Emma Washington  Emma Washington 11/07/2017,12:08 PM

## 2017-11-07 NOTE — Procedures (Signed)
Hemodialysis Catheter Insertion Procedure Note Emma Washington [id number] 02-13-1961  Procedure: Insertion of Hemodialysis Catheter Indications: Hemodialysis  Procedure Details Consent: Risks of procedure as well as the alternatives and risks of each were explained to the (patient/caregiver).  Consent for procedure obtained.  Time Out: Verified patient identification, verified procedure, site/side was marked, verified correct patient position, special equipment/implants available, medications/allergies/relevent history reviewed, required imaging and test results available.  Performed  Maximum sterile technique was used including antiseptics, cap, gloves, gown, hand hygiene, mask and sheet.  Skin prep: Chlorhexidine; local anesthetic administered  A Trialysis HD catheter was placed in the right internal jugular vein using the Seldinger technique.  Evaluation Blood flow good Complications: No apparent complications Patient did tolerate procedure well. Chest X-ray ordered to verify placement.  CXR: pending.   Procedure performed under direct supervision of Dr. Delton CoombesByrum and with ultrasound guidance for real time vessel cannulation.     Emma BrimBrandi Candee Hoon, NP-C [facility] Pgr: [phone] or [phone] 11/07/2017, 3:01 PM

## 2017-11-07 NOTE — Progress Notes (Signed)
Initial Nutrition Assessment  DOCUMENTATION CODES:   Morbid obesity  INTERVENTION:  - Will continue Vital High Protein @ 20 mL/hr which provides 480 kcal, 42 grams of protein, 11 grams of fat, and 401 mL free water.  - Will order 60 mL Prostat TID to provide 600 kcal, 90 grams of protein. Total TF regimen will provide 1080 kcal (74% minimum estimated kcal need) and 132 grams of protein. - Will monitor for ability to increase TF rate.   NUTRITION DIAGNOSIS:   Inadequate oral intake related to inability to eat as evidenced by NPO status.  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 Other associated symptoms include nausea, vomiting and decreased PO intake (since 1/13). Pertinent PMHx significant for nephrolithiasis.   Significant Events: 1/15- MRCP and CXR 1/18- intubated around 3:00 AM and plan for OGT placement and trickle TF initiation   Weight +19 lbs/8.7 kg compared to weight from 1/14 so used weight from 1/14 (134.3 kg) in estimating needs. Weight from that date indicates BMI of 43.7 kg/m2 (morbid obesity). No NGT/OGT in place at this time but PCCM NP note indicates plan for OGT placement today. No family/visitors present at this time to provide information. Pt has been NPO since admission on 1/14.   MRCP showed choledocolithiasis, possible pancreatic necrosis of body and tail, edema in the retroperitoneal space, and hepatomegaly. CXR showed possible bilateral atelectasis vs PNA. Pt with pancreatitis.   Per GI MD note this AM, pt will require cholecystectomy. Per PCCM NP Merry Proud(Brandi) note this AM, pt with AKI and oliguria and anticipating need for CRRT. Plan to d/c Propofol and  transition to Fentanyl for sedation. Spoke with Merry ProudBrandi who gives okay for RD to order Prostat in addition to trickle TF.   Patient is currently intubated on ventilator support MV: 14.1 L/min Temp (24hrs), Avg:98.1 F (36.7 C), Min:97.7 F (36.5 C), Max:99.2 F (37.3 C) Propofol: 12.5 ml/hr (330 kcal)--> plan to switch to Fentanyl  BP: 98/41 and MAP: 56  Medications reviewed; 2 g IV Ca gluconate x1 run today, sliding scale Novolog, 40 mg IV Protonix BID.  Labs reviewed; CBGs: 156 x2 and 165 mg/dL this AM, BUN: 67 mg/dL, creatinine: 1.614.59 mg/dL, Ca: <4 mg/dL, Phos: 4.7 mg/dL, GFR: 10 mL/min.  IVF: LR @ 10 mL/hr.      NUTRITION - FOCUSED PHYSICAL EXAM:  Completed/assessed and no fat or muscle wasting noted at this time.   Diet Order:  Diet NPO time specified  EDUCATION NEEDS:   No education needs have been identified at this time  Skin:  Skin Assessment: Reviewed RN Assessment  Last BM:  1/13 (PTA)  Height:   Ht Readings from Last 1 Encounters:  11/07/17 5\' 9"  (1.753 m)    Weight:   Wt Readings from Last 1 Encounters:  11/06/17 (!) 315 lb 4.1 oz (143 kg)    Ideal Body Weight:  65.9 kg  BMI:  Body mass index is 46.56 kg/m.  Estimated Nutritional Needs:   Kcal:  1450-1648 (22-25 kcal/kg)  Protein:  132 grams (2 grams/kg IBW)  Fluid:  >/= 1.8 L/day     Trenton GammonJessica Shirrell Solinger, MS, RD, LDN, CNSC Inpatient Clinical Dietitian Pager # [phone] After hours/weekend pager #  [phone]  

## 2017-11-07 NOTE — Progress Notes (Signed)
Pharmacy Antibiotic Note  Emma Washington is a 57 y.o. female admitted on May 18, 2018 with acute pancreatitis, pancreatic edema and peripancreatic fluid,  possible pancreatic necrosis.  Pharmacy has been consulted for Zosyn dosing.  Plan: Continue Zosyn 3.375g IV Q8H infused over 4hrs.  Follow up renal fxn, culture results, and clinical course.   Height: 5\' 9"  (175.3 cm) Weight: (!) 315 lb 4.1 oz (143 kg) IBW/kg (Calculated) : 66.2  Temp (24hrs), Avg:98.1 F (36.7 C), Min:97.7 F (36.5 C), Max:99.2 F (37.3 C)  Recent Labs  Lab 11/04/17 0254 11/04/17 1144 11/04/17 1548 11/05/17 0242 11/05/17 1101 11/06/17 0248 11/07/17 0334  WBC 18.0* 15.2*  --  17.3*  --  12.4* 11.7*  CREATININE 2.49* 2.72* 2.49* 3.20* 3.55* 4.51* 4.59*    Estimated Creatinine Clearance: 21.2 mL/min (A) (by C-G formula based on SCr of 4.59 mg/dL (H)).    Allergies  Allergen Reactions  . Demerol Other (See Comments)    Severe hypotension  . Meperidine     Other reaction(s): Hypotension, Hypotension (ALLERGY/intolerance)  . Other Anaphylaxis    Pineapple  . Pineapple Swelling and Anaphylaxis    Antimicrobials this admission: 1/15 Zosyn >>   Dose adjustments this admission:  Microbiology results: 1/14 MRSA PCR: negative  Thank you for allowing pharmacy to be a part of this patient's care.  Lynann Beaverhristine Errika Narvaiz PharmD, BCPS Pager [phone] 11/07/2017 8:16 AM

## 2017-11-07 NOTE — Progress Notes (Addendum)
[facility] Progress Note  CC:   Pancreatitis   Subjective:  Patient is now intubated.  Objective:  Vital signs in last 24 hours: Temp:  [97.7 F (36.5 C)-99.2 F (37.3 C)] 99.2 F (37.3 C) (01/18 0800) Pulse Rate:  [103-134] 103 (01/18 0800) Resp:  [16-37] 25 (01/18 0800) BP: (89-152)/(51-126) 104/71 (01/18 0800) SpO2:  [88 %-98 %] 97 % (01/18 0800) FiO2 (%):  [100 %] 100 % (01/18 0445) Last BM Date: 11/02/17 General:  Alert, Well-developed, in NAD; intubated Heart:  Slightly tachy; no murmurs Pulm:  Course BS noted. Abdomen:  Soft, somewhat distended.  BS quiet, sparse. Extremities:  Without edema.  Intake/Output from previous day: 01/17 0701 - 01/18 0700 In: 1966.3 [I.V.:1716.3; IV Piggyback:250] Out: 475 [Urine:475] Intake/Output this shift: Total I/O In: 49.3 [I.V.:49.3] Out: 90 [Urine:90]  Lab Results: Recent Labs    11/05/17 0242 11/06/17 0248 11/07/17 0334  WBC 17.3* 12.4* 11.7*  HGB 13.7 12.2 12.0  HCT 40.7 36.9 36.6  PLT 221 224 241   BMET Recent Labs    11/05/17 1101 11/06/17 0248 11/07/17 0334  NA 136 138 139  K 4.5 4.0 4.4  CL 108 109 110  CO2 18* 19* 18*  GLUCOSE 187* 182* 160*  BUN 49* 61* 67*  CREATININE 3.55* 4.51* 4.59*  CALCIUM 4.8* 4.2* <4.0*   LFT Recent Labs    11/07/17 0334  PROT 5.8*  ALBUMIN 2.6*  AST 49*  ALT 106*  ALKPHOS 73  BILITOT 0.9   US Renal  Result Date: 11/06/2017 CLINICAL DATA:  Acute kidney injury. EXAM: RENAL / URINARY TRACT ULTRASOUND COMPLETE COMPARISON:  None. FINDINGS: Right Kidney: Length: 12.8 cm. Echogenicity within normal limits. No mass or hydronephrosis visualized. Left Kidney: Length: 11.1 cm. Echogenicity within normal limits. No mass or hydronephrosis visualized. Bladder: Not visualize. Other: Small volume pelvic ascites. IMPRESSION: 1. Normal renal ultrasound. Electronically Signed   By: Kathreen Devoid   On: 11/06/2017 15:15   Dg Chest Port 1 View  Result Date:  11/07/2017 CLINICAL DATA:  57 y/o  F; post intubation, ET tube position. EXAM: PORTABLE CHEST 1 VIEW COMPARISON:  11/06/2017 chest radiograph FINDINGS: Low lung volumes. Stable enlarged cardiac silhouette given projection and technique. Increased diffuse hazy airspace opacities and small effusions probably representing pulmonary edema. Endotracheal tube is 1.4 cm from carina. No acute osseous abnormality identified. IMPRESSION: Endotracheal tube 1.4 cm from carina. Increased hazy lung opacities compatible with pulmonary edema and small effusions bilaterally. Electronically Signed   By: Kristine Garbe M.D.   On: 11/07/2017 04:24   Dg Chest Port 1 View  Result Date: 11/06/2017 CLINICAL DATA:  Acute respiratory failure EXAM: PORTABLE CHEST 1 VIEW COMPARISON:  11/04/2017 FINDINGS: Hypoventilation with decreased lung volume. Increase in bibasilar atelectasis left greater than right. Probable left pleural effusion. Pulmonary vascular congestion suggesting mild heart failure. IMPRESSION: Hypoventilation with increased atelectasis in lung bases left greater than right. Small left effusion. Progression of pulmonary vascular congestion suggesting mild fluid overload. Electronically Signed   By: Franchot Gallo M.D.   On: 11/06/2017 09:05   Assessment / Plan: *57 year old female with severe gallstone pancreatitis. Necrosis seen on CT scan. CBD stone confirmed on MRCP.  LFT's are trending down.  ? If she passed the stone and having decreased inflammation and periportal edema with less biliary compression.  *AKI in the setting of pancreatitis and volume depletion:  Cr continues to worsen.  -Monitor labs closely. -ContinueZosyn. -Appreciate renal consult and  PCCM assistance. -Will consult surgery later once pancreatitis is improved as she will also need cholecystectomy at some point.    LOS: 4 days   Laban Emperor. Zehr  11/07/2017, 9:15 AM  Pager number [phone]   Attending physician's note   I  have taken an interval history, reviewed the chart and examined the patient. I agree with the Advanced Practitioner's note, impression and recommendations.  Status post intubation .  Renal function progressively worse with rising creatinine.  Persistent hypocalcemia indicative of likely extensive pancreatic necrosis On exam abdomen is significantly distended and firm. LFT continue to trend down, bilirubin 0.9 and alk phos 73 We are available for any questions/ concerns.   Please call with any change in clinical status Dr Collene Mares is oncall this weekend  K Denzil Magnuson, MD [phone] Mon-Fri 8a-5p [phone] after 5p, weekends, holidays

## 2017-11-07 NOTE — Progress Notes (Signed)
PULMONARY / CRITICAL CARE MEDICINE   Name: Emma Washington MRN: [id number] DOB: 07-May-1961    ADMISSION DATE:  10/21/2017 CONSULTATION DATE:  11/04/2017  REFERRING MD:  Dr. 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 Other associated symptoms include nausea, vomiting and decreased PO intake (since 1/13). Pertinent PMHx significant for nephrolithiasis.   Initial labs show lipase >10,000, mildly elevated WBC of 11.5, creatinine 1.33, AST of 497 and ALT of 601. UA positive for ketones, protein, mucus and rare bacteria. UDS positive for opiates. CT renal stone study showed evidence of acute pancreatitis with pancreatic edema and peripancreatic fluid anteriorly to the body and tail of the pancreas as well as a 1 mm non obstructing stone in the upper pole of the left kidney.  Patient was admitted and GI consulted. GI suggested MRCP to be done today which shows cholelithiasis, possible choledocolithiasis, possible pancreatic necrosis of the body and tail, edema/inflammation in the retroperitoneal space and hepatomegaly. CXR today indicate low lung volumes and possible bilateral atelectasis vs PNA.  Patient was tachypnic with concern for decompensation which prompted a critical care consult. She is currently on protonix, zosyn, morphine, fluids, phenergan, reglan and zofran.    SUBJECTIVE: Decompensated overnight > intubated.  13L+ positive.  Rising sr cr. Pt denies pain.  On propofol.    VITAL SIGNS: BP (!) 139/51   Pulse (!) 110   Temp 98 F (36.7 C) (Oral)   Resp (!) 25   Ht 5\' 9"  (1.753 m)   Wt (!) 315 lb 4.1 oz (143 kg)   LMP 11/07/2011   SpO2 97%   BMI 46.56 kg/m   HEMODYNAMICS:    VENTILATOR SETTINGS: Vent Mode: PRVC FiO2 (%):  [100 %] 100 % Set Rate:  [16 bmp-25 bmp] 25 bmp Vt Set:  [400 mL-450 mL] 450 mL PEEP:  [5 cmH20] 5  cmH20 Plateau Pressure:  [20 cmH20] 20 cmH20  INTAKE / OUTPUT: I/O last 3 completed shifts: In: 2221.3 [I.V.:2021.3; IV Piggyback:200] Out: 475 [Urine:475]  PHYSICAL EXAMINATION: General: obese adult female in NAD on vent HEENT: MM pink/moist, ETT  Neuro: AAOx4, speech clear, MAE  CV: s1s2 rrr, no m/r/g PULM: even/non-labored, lungs bilaterally coarse, diminished lower GI: protuberant abdomen, mild distention Extremities: warm/dry, trace generalized edema  Skin: no rashes or lesions  LABS:  BMET Recent Labs  Lab 11/05/17 1101 11/06/17 0248 11/07/17 0334  NA 136 138 139  K 4.5 4.0 4.4  CL 108 109 110  CO2 18* 19* 18*  BUN 49* 61* 67*  CREATININE 3.55* 4.51* 4.59*  GLUCOSE 187* 182* 160*    Electrolytes Recent Labs  Lab 11/05/17 0242 11/05/17 1101 11/06/17 0248 11/07/17 0334  CALCIUM 5.1* 4.8* 4.2* <4.0*  MG 1.9  --  1.7 2.2  PHOS  --   --  1.9* 4.7*    CBC Recent Labs  Lab 11/05/17 0242 11/06/17 0248 11/07/17 0334  WBC 17.3* 12.4* 11.7*  HGB 13.7 12.2 12.0  HCT 40.7 36.9 36.6  PLT 221 224 241    Coag's No results for input(s): APTT, INR in the last 168 hours.  Sepsis Markers No results for input(s): LATICACIDVEN, PROCALCITON, O2SATVEN in the last 168 hours.  ABG Recent Labs  Lab 11/07/17 0432 11/07/17 0650  PHART 7.089* 7.191*  PCO2ART 65.4* 45.7  PO2ART 88.1 149*    Liver  Enzymes Recent Labs  Lab 11/05/17 0242 11/06/17 0248 11/07/17 0334  AST 92* 50* 49*  ALT 270* 151* 106*  ALKPHOS 86 75 73  BILITOT 5.3* 1.9* 0.9  ALBUMIN 2.8* 2.8* 2.6*    Cardiac Enzymes No results for input(s): TROPONINI, PROBNP in the last 168 hours.  Glucose Recent Labs  Lab 11/06/17 1122 11/06/17 1607 11/06/17 1943 11/07/17 0038 11/07/17 0333 11/07/17 0730  GLUCAP 146* 161* 156* 156* 156* 165*    Imaging US Renal  Result Date: 11/06/2017 CLINICAL DATA:  Acute kidney injury. EXAM: RENAL / URINARY TRACT ULTRASOUND COMPLETE COMPARISON:  None.  FINDINGS: Right Kidney: Length: 12.8 cm. Echogenicity within normal limits. No mass or hydronephrosis visualized. Left Kidney: Length: 11.1 cm. Echogenicity within normal limits. No mass or hydronephrosis visualized. Bladder: Not visualize. Other: Small volume pelvic ascites. IMPRESSION: 1. Normal renal ultrasound. Electronically Signed   By: Elige Ko   On: 11/06/2017 15:15   Dg Chest Port 1 View  Result Date: 11/07/2017 CLINICAL DATA:  57 y/o  F; post intubation, ET tube position. EXAM: PORTABLE CHEST 1 VIEW COMPARISON:  11/06/2017 chest radiograph FINDINGS: Low lung volumes. Stable enlarged cardiac silhouette given projection and technique. Increased diffuse hazy airspace opacities and small effusions probably representing pulmonary edema. Endotracheal tube is 1.4 cm from carina. No acute osseous abnormality identified. IMPRESSION: Endotracheal tube 1.4 cm from carina. Increased hazy lung opacities compatible with pulmonary edema and small effusions bilaterally. Electronically Signed   By: Mitzi Hansen M.D.   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   MR abdomen/MRCP 1/15 >> cholelithiasis, possible choledocolithiasis, possible pancreatic necrosis of the body and tail, edema/inflammation in the retroperitoneal space and hepatomegaly  CXR 1/15 >> Atelectasis vs PNA L>R; low lung volumes, mild cardiopericadrial silhouette enlargement.  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  Decompensated with progressive AKI (NSAIDS, galladium, shock) & respiratory distress requiring intubation.   ASSESSMENT / PLAN:  PULMONARY A: At Risk Respiratory Failure - in setting of volume resuscitation with pancreatitis, pain with splinting. High risk ALI Diffuse Bilateral Infiltrates - suspect edema, +/- ALI Pleural Effusions Tachypnea  P:   PRVC 8 cc/kg, R 18 until paralytics wear off  Wean PEEP / FiO2 for sats > 90% Follow CXR   CARDIOVASCULAR A:  Tachycardia   SIRS  P:  Monitor hemodynamics in ICU  RENAL A:   AKI / Oliguric - in setting of volume depletion (N/V, poor intake), pancreatitis. Consistent w ATN, should recover with restoration of perfusion pressures.  Hypophosphatemia  Hypomagnesemia  Hypocalcemia  P:   LR @ KVO Trend BMP / urinary output Replace electrolytes as indicated Avoid nephrotoxic agents, ensure adequate renal perfusion Appreciate Nephrology, anticipate need for CVVHD.  Have consented son for  HD catheter.   GASTROINTESTINAL A:   Acute Moderately Severe Pancreatitis - concern for possible necrosis on MRCP  Gallstone Pancreatitis Abdominal Pain  Nausea / vomiting P:   PRN phenergan for nausea NPO  Place OGT  Trickle TF  Will need ERCP / cholecystectomy at some point  PPI  Appreciate GI / CCS  HEMATOLOGIC A:   Hx Anemia  P:  Trend CBC  Heparin for DVT prophylaxis  D/C enoxaparin   INFECTIOUS A:   Gallstone Pancreatitis - triglycerides 213 P: Empiric zosyn in setting of severe pancreatitis  Monitor fever curve / WBC trend  ENDOCRINE A:   Hyperglycemia - in setting of pancreatitis   P:   SSI Q4  Monitor for  hypoglycemia  NEUROLOGIC A:   Pain - secondary to gallstone pancreatitis  P:   RASS goal: 0 to -1  Fentanyl gtt for pain / sedation  PRN versed for sedation  Discontinue propofol with pancreatitis Passive ROM    FAMILY  - Updates: Patients brother updated on plan of care.  Consent obtained for HD catheter, risks / benefits explained to brother Kathlene November).       [name], NP-C [facility] Pgr: [phone] or if no answer [phone] 11/07/2017, 7:53 AM

## 2017-11-07 NOTE — Progress Notes (Signed)
Pharmacy Antibiotic Note  Emma Washington is a 57 y.o. female admitted on 11/10/2017 with acute pancreatitis, pancreatic edema and peripancreatic fluid,  possible pancreatic necrosis.  Pharmacy has been consulted for Zosyn dosing.  Today, 11/07/2017: SCr continues to increase, 5.16 now with CrCl ~ 18 ml/min Considering CRRT if renal function does not improve  Plan: Decrease to Zosyn 2.25g IV q8h Follow up renal fxn, culture results, and clinical course.   Height: 5\' 9"  (175.3 cm) Weight: (!) 315 lb 4.1 oz (143 kg) IBW/kg (Calculated) : 66.2  Temp (24hrs), Avg:98.3 F (36.8 C), Min:97.7 F (36.5 C), Max:99.4 F (37.4 C)  Recent Labs  Lab 11/04/17 1144  11/05/17 0242 11/05/17 1101 11/06/17 0248 11/07/17 0334 11/07/17 0933  WBC 15.2*  --  17.3*  --  12.4* 11.7* 10.4  CREATININE 2.72*   < > 3.20* 3.55* 4.51* 4.59* 5.16*   < > = values in this interval not displayed.    Estimated Creatinine Clearance: 18.8 mL/min (A) (by C-G formula based on SCr of 5.16 mg/dL (H)).    Allergies  Allergen Reactions  . Demerol Other (See Comments)    Severe hypotension  . Meperidine     Other reaction(s): Hypotension, Hypotension (ALLERGY/intolerance)  . Other Anaphylaxis    Pineapple  . Pineapple Swelling and Anaphylaxis    Antimicrobials this admission: 1/15 Zosyn >>   Dose adjustments this admission:  Microbiology results: 1/14 MRSA PCR: negative  Thank you for allowing pharmacy to be a part of this patient's care.  Lynann Beaverhristine Kadra Kohan PharmD, BCPS Pager [phone] 11/07/2017 12:14 PM

## 2017-11-07 NOTE — Progress Notes (Signed)
Pharmacy Antibiotic Note  Emma Washington is a 57 y.o. female admitted on 2017-12-25 with acute pancreatitis, pancreatic edema and peripancreatic fluid,  possible pancreatic necrosis.  Pharmacy has been consulted for Zosyn dosing.  Today, 11/07/2017: SCr continues to increase, 5.16 now with CrCl ~ 18 ml/min Beginning CRRT this evening  Plan: Adjust Zosyn 3.375g IV q6h 30 minute infusion time with start of CRRT Follow up renal fxn, culture results, and clinical course.   Height: 5\' 9"  (175.3 cm) Weight: (!) 315 lb 4.1 oz (143 kg) IBW/kg (Calculated) : 66.2  Temp (24hrs), Avg:99.2 F (37.3 C), Min:97.7 F (36.5 C), Max:102.9 F (39.4 C)  Recent Labs  Lab 11/04/17 1144  11/05/17 0242 11/05/17 1101 11/06/17 0248 11/07/17 0334 11/07/17 0933 11/07/17 1529  WBC 15.2*  --  17.3*  --  12.4* 11.7* 10.4  --   CREATININE 2.72*   < > 3.20* 3.55* 4.51* 4.59* 5.16* 5.42*   < > = values in this interval not displayed.    Estimated Creatinine Clearance: 17.9 mL/min (A) (by C-G formula based on SCr of 5.42 mg/dL (H)).    Allergies  Allergen Reactions  . Demerol Other (See Comments)    Severe hypotension  . Meperidine     Other reaction(s): Hypotension, Hypotension (ALLERGY/intolerance)  . Other Anaphylaxis    Pineapple  . Pineapple Swelling and Anaphylaxis    Antimicrobials this admission: 1/15 Zosyn >>   Dose adjustments this admission: 1/18 adjust zosyn to 3.375g q6h for 30 min infusions with start of CVVHDF  Microbiology results: 1/14 MRSA PCR: negative  Thank you for allowing pharmacy to be a part of this patient's care.  Clance BollAmanda Elza Sortor, PharmD, BCPS Pager: [phone] 11/07/2017 5:48 PM

## 2017-11-07 NOTE — Progress Notes (Signed)
CRITICAL VALUE ALERT  Critical Value: Calcium less than 4  Date & Time Notied:  11/07/2017 0415am  Provider Notified: E link notified   Orders Received/Actions taken: Waiting for orders, will continue to monitor

## 2017-11-08 ENCOUNTER — Inpatient Hospital Stay (HOSPITAL_COMMUNITY): Payer: BLUE CROSS/BLUE SHIELD

## 2017-11-08 LAB — RENAL FUNCTION PANEL
ALBUMIN: 2.1 g/dL — AB (ref 3.5–5.0)
ANION GAP: 8 (ref 5–15)
BUN: 52 mg/dL — AB (ref 6–20)
CHLORIDE: 106 mmol/L (ref 101–111)
CO2: 24 mmol/L (ref 22–32)
Calcium: 6.4 mg/dL — CL (ref 8.9–10.3)
Creatinine, Ser: 2.93 mg/dL — ABNORMAL HIGH (ref 0.44–1.00)
GFR, EST AFRICAN AMERICAN: 20 mL/min — AB (ref >60)
GFR, EST NON AFRICAN AMERICAN: 17 mL/min — AB (ref >60)
Glucose, Bld: 145 mg/dL — ABNORMAL HIGH (ref 65–99)
PHOSPHORUS: 3.3 mg/dL (ref 2.5–4.6)
POTASSIUM: 4 mmol/L (ref 3.5–5.1)
Sodium: 138 mmol/L (ref 135–145)

## 2017-11-08 LAB — POCT ACTIVATED CLOTTING TIME
ACTIVATED CLOTTING TIME: 147 s
ACTIVATED CLOTTING TIME: 175 s
ACTIVATED CLOTTING TIME: 175 s
ACTIVATED CLOTTING TIME: 175 s
ACTIVATED CLOTTING TIME: 186 s
ACTIVATED CLOTTING TIME: 169 s
ACTIVATED CLOTTING TIME: 186 s
ACTIVATED CLOTTING TIME: 180 s
ACTIVATED CLOTTING TIME: 186 s
ACTIVATED CLOTTING TIME: 142 s
ACTIVATED CLOTTING TIME: 158 s
Activated Clotting Time: 164 seconds
Activated Clotting Time: 169 seconds
Activated Clotting Time: 169 seconds
Activated Clotting Time: 175 seconds
Activated Clotting Time: 175 seconds
Activated Clotting Time: 180 seconds
Activated Clotting Time: 186 seconds
Activated Clotting Time: 186 seconds
Activated Clotting Time: 175 seconds
Activated Clotting Time: 136 seconds
Activated Clotting Time: 153 seconds

## 2017-11-08 LAB — GLUCOSE, CAPILLARY
GLUCOSE-CAPILLARY: 157 mg/dL — AB (ref 65–99)
GLUCOSE-CAPILLARY: 114 mg/dL — AB (ref 65–99)
Glucose-Capillary: 155 mg/dL — ABNORMAL HIGH (ref 65–99)
Glucose-Capillary: 159 mg/dL — ABNORMAL HIGH (ref 65–99)

## 2017-11-08 LAB — COMPREHENSIVE METABOLIC PANEL
ALT: 75 U/L — ABNORMAL HIGH (ref 14–54)
AST: 58 U/L — ABNORMAL HIGH (ref 15–41)
Albumin: 2.3 g/dL — ABNORMAL LOW (ref 3.5–5.0)
Alkaline Phosphatase: 73 U/L (ref 38–126)
Anion gap: 9 (ref 5–15)
BUN: 62 mg/dL — ABNORMAL HIGH (ref 6–20)
CHLORIDE: 105 mmol/L (ref 101–111)
CO2: 23 mmol/L (ref 22–32)
Calcium: 4.9 mg/dL — CL (ref 8.9–10.3)
Creatinine, Ser: 3.75 mg/dL — ABNORMAL HIGH (ref 0.44–1.00)
GFR calc non Af Amer: 13 mL/min — ABNORMAL LOW (ref >60)
GFR, EST AFRICAN AMERICAN: 15 mL/min — AB (ref >60)
Glucose, Bld: 161 mg/dL — ABNORMAL HIGH (ref 65–99)
Potassium: 3.9 mmol/L (ref 3.5–5.1)
SODIUM: 137 mmol/L (ref 135–145)
Total Bilirubin: 1 mg/dL (ref 0.3–1.2)
Total Protein: 5.6 g/dL — ABNORMAL LOW (ref 6.5–8.1)

## 2017-11-08 LAB — CBC
HEMATOCRIT: 32.4 % — AB (ref 36.0–46.0)
HEMOGLOBIN: 10.5 g/dL — AB (ref 12.0–15.0)
MCH: 29.7 pg (ref 26.0–34.0)
MCHC: 32.4 g/dL (ref 30.0–36.0)
MCV: 91.5 fL (ref 78.0–100.0)
Platelets: 265 10*3/uL (ref 150–400)
RBC: 3.54 MIL/uL — AB (ref 3.87–5.11)
RDW: 17.2 % — ABNORMAL HIGH (ref 11.5–15.5)
WBC: 11.6 10*3/uL — ABNORMAL HIGH (ref 4.0–10.5)

## 2017-11-08 LAB — APTT: aPTT: 38 seconds — ABNORMAL HIGH (ref 24–36)

## 2017-11-08 LAB — PHOSPHORUS: PHOSPHORUS: 3.6 mg/dL (ref 2.5–4.6)

## 2017-11-08 LAB — MAGNESIUM: Magnesium: 2.5 mg/dL — ABNORMAL HIGH (ref 1.7–2.4)

## 2017-11-08 MED ORDER — ACETAMINOPHEN 325 MG PO TABS: 650.0000 mg | ORAL_TABLET | Freq: Four times a day (QID) | ORAL | Status: DC | PRN

## 2017-11-08 MED ORDER — ORAL CARE MOUTH RINSE
15.0000 mL | Freq: Four times a day (QID) | OROMUCOSAL | Status: DC
  Administered 2017-11-08 – 2017-11-09 (×4): 15 mL via OROMUCOSAL

## 2017-11-08 MED ORDER — CHLORHEXIDINE GLUCONATE 0.12% ORAL RINSE (MEDLINE KIT)
15.0000 mL | Freq: Two times a day (BID) | OROMUCOSAL | Status: DC
  Administered 2017-11-08 – 2017-11-09 (×3): 15 mL via OROMUCOSAL

## 2017-11-08 MED ORDER — CHLORHEXIDINE GLUCONATE CLOTH 2 % EX PADS
6.0000 | MEDICATED_PAD | Freq: Every day | CUTANEOUS | Status: DC
Start: 2017-11-09 — End: 2017-11-09
  Administered 2017-11-09: 6 via TOPICAL

## 2017-11-08 MED ORDER — SODIUM CHLORIDE 0.9 % IV SOLN
1.5000 mg/kg/h | INTRAVENOUS | Status: AC
  Administered 2017-11-08: 1.5 mg/kg/h via INTRAVENOUS
  Filled 2017-11-08: qty 100

## 2017-11-08 NOTE — Progress Notes (Signed)
CVVHDF set changed at 1300 d/t rising pressures. Reinitiated at 1346 without complications.

## 2017-11-08 NOTE — Progress Notes (Signed)
Subjective: Interval History: On vent , awake, coop, .  Objective: Vital signs in last 24 hours: Temp:  [98.2 F (36.8 C)-102.9 F (39.4 C)] 98.2 F (36.8 C) (01/19 0800) Pulse Rate:  [89-115] 94 (01/19 1000) Resp:  [15-34] 26 (01/19 1000) BP: (85-141)/(35-84) 112/84 (01/19 1000) SpO2:  [92 %-99 %] 96 % (01/19 1000) FiO2 (%):  [40 %-70 %] 40 % (01/19 1000) Weight:  [141.2 kg (311 lb 4.6 oz)] 141.2 kg (311 lb 4.6 oz) (01/19 0500) Weight change:   Intake/Output from previous day: 01/18 0701 - 01/19 0700 In: 1202.3 [I.V.:592.3; NG/GT:350; IV Piggyback:150] Out: 1485 [Urine:802] Intake/Output this shift: Total I/O In: 355 [I.V.:75; Other:30; NG/GT:200; IV Piggyback:50] Out: 352 [Urine:139; Other:213]  General appearance: cooperative, moderately obese and pale Neck: IJ HD cath Resp: diminished breath sounds bilaterally Cardio: S1, S2 normal and systolic murmur: holosystolic 2/6, blowing at apex GI: distended, no bs Extremities: edema 2+  Lab Results: Recent Labs    11/07/17 0933 11/08/17 0601  WBC 10.4 11.6*  HGB 11.0* 10.5*  HCT 33.4* 32.4*  PLT 244 265   BMET:  Recent Labs    11/07/17 1529 11/08/17 0601  NA 141 137  K 4.5 3.9  CL 111 105  CO2 18* 23  GLUCOSE 157* 161*  BUN 83* 62*  CREATININE 5.42* 3.75*  CALCIUM <4.0* 4.9*   No results for input(s): PTH in the last 72 hours. Iron Studies: No results for input(s): IRON, TIBC, TRANSFERRIN, FERRITIN in the last 72 hours.  Studies/Results: Dg Abd 1 View  Result Date: 11/07/2017 CLINICAL DATA:  Orogastric tube placement. EXAM: ABDOMEN - 1 VIEW COMPARISON:  None. FINDINGS: Orogastric tube extends into the stomach. Bowel gas pattern shows probable ileus. IMPRESSION: Orogastric tube extends into the stomach. Electronically Signed   By: Irish Lack M.D.   On: 11/07/2017 15:23   US Renal  Result Date: 11/06/2017 CLINICAL DATA:  Acute kidney injury. EXAM: RENAL / URINARY TRACT ULTRASOUND COMPLETE COMPARISON:   None. FINDINGS: Right Kidney: Length: 12.8 cm. Echogenicity within normal limits. No mass or hydronephrosis visualized. Left Kidney: Length: 11.1 cm. Echogenicity within normal limits. No mass or hydronephrosis visualized. Bladder: Not visualize. Other: Small volume pelvic ascites. IMPRESSION: 1. Normal renal ultrasound. Electronically Signed   By: Elige Ko   On: 11/06/2017 15:15   Dg Chest Port 1 View  Result Date: 11/08/2017 CLINICAL DATA:  Hypoxia EXAM: PORTABLE CHEST 1 VIEW COMPARISON:  November 07, 2017 FINDINGS: Endotracheal tube tip is 3.7 cm above the carina. Nasogastric tube tip and side port are below the diaphragm. Central catheter tip is in the superior vena cava. No pneumothorax. There is atelectatic change in both lung bases, more on the left than on the right, stable. There is a minimal pleural effusion on each side. Heart is mildly enlarged with pulmonary vascularity within normal limits. No adenopathy. No bone lesions. IMPRESSION: Tube and catheter positions as described without evident pneumothorax. Bibasilar atelectasis, more on the left than on the right, with small bilateral pleural effusions. Stable cardiac prominence. Electronically Signed   By: Bretta Bang III M.D.   On: 11/08/2017 07:13   Dg Chest Port 1 View  Result Date: 11/07/2017 CLINICAL DATA:  Central line placement. EXAM: PORTABLE CHEST 1 VIEW COMPARISON:  0240 hours FINDINGS: Interval placement of right jugular non tunneled hemodialysis catheter with the catheter tip at the level of the mid SVC. No pneumothorax. Endotracheal tube remains present with the tip approximately 3.5 cm above the carina. Gastric  decompression tube extends into the stomach. Lungs show improved aeration bilaterally and decrease in pulmonary edema. Probable component of left pleural fluid. IMPRESSION: Non tunneled dialysis catheter tip in SVC. No pneumothorax identified after placement. Lungs show improved aeration with decrease in pulmonary  edema. Electronically Signed   By: Irish LackGlenn  Yamagata M.D.   On: 11/07/2017 15:22   Dg Chest Port 1 View  Result Date: 11/07/2017 CLINICAL DATA:  57 y/o  F; post intubation, ET tube position. EXAM: PORTABLE CHEST 1 VIEW COMPARISON:  11/06/2017 chest radiograph FINDINGS: Low lung volumes. Stable enlarged cardiac silhouette given projection and technique. Increased diffuse hazy airspace opacities and small effusions probably representing pulmonary edema. Endotracheal tube is 1.4 cm from carina. No acute osseous abnormality identified. IMPRESSION: Endotracheal tube 1.4 cm from carina. Increased hazy lung opacities compatible with pulmonary edema and small effusions bilaterally. Electronically Signed   By: Mitzi HansenLance  Furusawa-Stratton M.D.   On: 11/07/2017 04:24    I have reviewed the patient's current medications.  Assessment/Plan: 1 AKI CRRT going well with correction of acid/base/K.  Still vol xs but bp soft.  Making some urine also 2 VDRF per CCM 3 Necrotizing pancreatits still ileus 4 Obesity 5 bp stabliized P CRRT, even, NG , vent, supportive care    LOS: 5 days   Fayrene FearingJames Phyliss Hulick 11/08/2017,11:16 AM

## 2017-11-08 NOTE — Progress Notes (Signed)
PULMONARY / CRITICAL CARE MEDICINE   Name: Emma Washington MRN: [id number] DOB: 04/23/1961    ADMISSION DATE:  10/29/2017 CONSULTATION DATE:  11/04/2017  REFERRING MD:  Dr. 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 Other associated symptoms include nausea, vomiting and decreased PO intake (since 1/13). Pertinent PMHx significant for nephrolithiasis.   Initial labs show lipase >10,000, mildly elevated WBC of 11.5, creatinine 1.33, AST of 497 and ALT of 601. UA positive for ketones, protein, mucus and rare bacteria. UDS positive for opiates. CT renal stone study showed evidence of acute pancreatitis with pancreatic edema and peripancreatic fluid anteriorly to the body and tail of the pancreas as well as a 1 mm non obstructing stone in the upper pole of the left kidney.  Patient was admitted and GI consulted. GI suggested MRCP to be done today which shows cholelithiasis, possible choledocolithiasis, possible pancreatic necrosis of the body and tail, edema/inflammation in the retroperitoneal space and hepatomegaly. CXR today indicate low lung volumes and possible bilateral atelectasis vs PNA.  Patient was tachypnic with concern for decompensation which prompted a critical care consult. She is currently on protonix, zosyn, morphine, fluids, phenergan, reglan and zofran.      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   MR abdomen/MRCP 1/15 >>  cholelithiasis, possible choledocolithiasis, possible pancreatic necrosis of the body and tail, edema/inflammation in the retroperitoneal space and hepatomegaly  CXR 1/15 >> Atelectasis vs PNA L>R; low lung volumes, mild cardiopericadrial silhouette enlargement.  US Renal 1/17 >> normal renal US  CULTURES: None  ANTIBIOTICS: Zosyn 1/15 >>   SIGNIFICANT EVENTS: 1/14 Admit with abd pain 1/15 Feels better, some nausea/vomiting, progressive renal dysfunction, 9L resuscitation 1/18 Decompensated 0300, intubated  LINES/TUBES: ETT 1/18 >>    EVENTs 1/1`8 -  Decompensated overnight > intubated.  13L+ positive.  Rising sr cr. Pt denies pain.  On propofol.     SUBJECTIVE/OVERNIGHT/INTERVAL HX 1/19 - intubated. On vent. On CRRT; makiong urine 25cc/h. On fent 200 -> folloiwing commands. Not on pressors.    VITAL SIGNS: BP (!) 115/48   Pulse 92   Temp 98.2 F (36.8 C) (Axillary)   Resp (!) 25   Ht 5\' 9"  (1.753 m)   Wt (!) 141.2 kg (311 lb 4.6 oz)   LMP 11/07/2011   SpO2 97%   BMI 45.97 kg/m   HEMODYNAMICS:    VENTILATOR SETTINGS: Vent Mode: PRVC FiO2 (%):  [50 %-100 %] 50 % Set Rate:  [25 bmp] 25 bmp Vt Set:  [450 mL] 450 mL PEEP:  [8 cmH20] 8 cmH20 Plateau Pressure:  [19 cmH20-24 cmH20] 23 cmH20  INTAKE / OUTPUT: I/O last 3 completed shifts: In: 2968.7 [I.V.:2108.7; Other:110; NG/GT:350; IV Piggyback:400] Out: 1485 [Urine:802; Other:683]  PHYSICAL EXAMINATION:  General Appearance:    Looks criticall ill OBESE - yes  Head:    Normocephalic, without obvious abnormality, atraumatic  Eyes:    PERRL - yes, conjunctiva/corneas - clear  Ears:    Normal external ear canals, both ears  Nose:   NG tube - no  Throat:  ETT TUBE - yes , OG tube - yes  Neck:   Supple,  No enlargement/tenderness/nodules     Lungs:     Clear to auscultation bilaterally, Ventilator   Synchrony - yes , 40%. Peep 5  Chest wall:    No deformity  Heart:    S1 and S2 normal, no murmur, CVP -  no.  Pressors - no  Abdomen:     Soft, no masses, no organomegaly  Genitalia:    Not done  Rectal:   not done  Extremities:   Extremities- intaact     Skin:   Intact in exposed areas . Sacral area - no decub reported     Neurologic:   Sedation - fent 200 -> RASS - 0 . Moves all 4s - yes. CAM-ICU - neg . Orientation - x3+ - can get agitated if sedation cut off per Rn      PULMONARY Recent Labs  Lab 11/07/17 0432 11/07/17 0650  PHART 7.089* 7.191*  PCO2ART 65.4* 45.7  PO2ART 88.1 149*  HCO3 19.0* 16.9*  O2SAT 93.4 98.5    CBC Recent Labs  Lab 11/07/17 0334 11/07/17 0933 11/08/17 0601  HGB 12.0 11.0* 10.5*  HCT 36.6 33.4* 32.4*  WBC 11.7* 10.4 11.6*  PLT 241 244 265    COAGULATION No results for input(s): INR in the last 168 hours.  CARDIAC  No results for input(s): TROPONINI in the last 168 hours. No results for input(s): PROBNP in the last 168 hours.   CHEMISTRY Recent Labs  Lab 11/04/17 1144  11/05/17 0242  11/06/17 0248 11/07/17 0334 11/07/17 0933 11/07/17 1529 11/08/17 0601  NA 138   < > 137   < > 138 139 139 141 137  K 4.0   < > 4.7   < > 4.0 4.4 4.7 4.5 3.9  CL 110   < > 110   < > 109 110 110 111 105  CO2 19*   < > 19*   < > 19* 18* 18* 18* 23  GLUCOSE 254*   < > 206*   < > 182* 160* 164* 157* 161*  BUN 37*   < > 43*   < > 61* 67* 80* 83* 62*  CREATININE 2.72*   < > 3.20*   < > 4.51* 4.59* 5.16* 5.42* 3.75*  CALCIUM 5.8*   < > 5.1*   < > 4.2* <4.0* <4.0* <4.0* 4.9*  MG 1.3*  --  1.9  --  1.7 2.2  --   --  2.5*  PHOS  --   --   --   --  1.9* 4.7*  --  4.6 3.6   < > = values in this interval not displayed.   Estimated Creatinine Clearance: 25.7 mL/min (A) (by C-G formula based on SCr of 3.75 mg/dL (H)).   LIVER Recent Labs  Lab 11/04/17 1144 11/05/17 0242 11/06/17 0248 11/07/17 0334 11/07/17 1529 11/08/17 0601  AST 164* 92* 50* 49*  --  58*  ALT 390* 270* 151* 106*  --  75*  ALKPHOS 98 86 75 73  --  73  BILITOT 6.4* 5.3* 1.9* 0.9  --   1.0  PROT 5.5* 5.4* 5.5* 5.8*  --  5.6*  ALBUMIN 3.0* 2.8* 2.8* 2.6* 2.2* 2.3*     INFECTIOUS No results for input(s): LATICACIDVEN, PROCALCITON in the last 168  hours.   ENDOCRINE CBG (last 3)  Recent Labs    11/07/17 1635 11/07/17 1916 11/08/17 0307  GLUCAP 148* 168* 157*         IMAGING x48h  - image(s) personally visualized  -   highlighted in bold Dg Abd 1 View  Result Date: 11/07/2017 CLINICAL DATA:  Orogastric tube placement. EXAM: ABDOMEN - 1 VIEW COMPARISON:  None. FINDINGS: Orogastric tube extends into the stomach. Bowel gas pattern shows probable ileus. IMPRESSION: Orogastric tube extends into the stomach. Electronically Signed   By: Irish Lack M.D.   On: 11/07/2017 15:23   US Renal  Result Date: 11/06/2017 CLINICAL DATA:  Acute kidney injury. EXAM: RENAL / URINARY TRACT ULTRASOUND COMPLETE COMPARISON:  None. FINDINGS: Right Kidney: Length: 12.8 cm. Echogenicity within normal limits. No mass or hydronephrosis visualized. Left Kidney: Length: 11.1 cm. Echogenicity within normal limits. No mass or hydronephrosis visualized. Bladder: Not visualize. Other: Small volume pelvic ascites. IMPRESSION: 1. Normal renal ultrasound. Electronically Signed   By: Elige Ko   On: 11/06/2017 15:15   Dg Chest Port 1 View  Result Date: 11/08/2017 CLINICAL DATA:  Hypoxia EXAM: PORTABLE CHEST 1 VIEW COMPARISON:  November 07, 2017 FINDINGS: Endotracheal tube tip is 3.7 cm above the carina. Nasogastric tube tip and side port are below the diaphragm. Central catheter tip is in the superior vena cava. No pneumothorax. There is atelectatic change in both lung bases, more on the left than on the right, stable. There is a minimal pleural effusion on each side. Heart is mildly enlarged with pulmonary vascularity within normal limits. No adenopathy. No bone lesions. IMPRESSION: Tube and catheter positions as described without evident pneumothorax. Bibasilar atelectasis, more on the left than  on the right, with small bilateral pleural effusions. Stable cardiac prominence. Electronically Signed   By: Bretta Bang III M.D.   On: 11/08/2017 07:13   Dg Chest Port 1 View  Result Date: 11/07/2017 CLINICAL DATA:  Central line placement. EXAM: PORTABLE CHEST 1 VIEW COMPARISON:  0240 hours FINDINGS: Interval placement of right jugular non tunneled hemodialysis catheter with the catheter tip at the level of the mid SVC. No pneumothorax. Endotracheal tube remains present with the tip approximately 3.5 cm above the carina. Gastric decompression tube extends into the stomach. Lungs show improved aeration bilaterally and decrease in pulmonary edema. Probable component of left pleural fluid. IMPRESSION: Non tunneled dialysis catheter tip in SVC. No pneumothorax identified after placement. Lungs show improved aeration with decrease in pulmonary edema. Electronically Signed   By: Irish Lack M.D.   On: 11/07/2017 15:22   Dg Chest Port 1 View  Result Date: 11/07/2017 CLINICAL DATA:  57 y/o  F; post intubation, ET tube position. EXAM: PORTABLE CHEST 1 VIEW COMPARISON:  11/06/2017 chest radiograph FINDINGS: Low lung volumes. Stable enlarged cardiac silhouette given projection and technique. Increased diffuse hazy airspace opacities and small effusions probably representing pulmonary edema. Endotracheal tube is 1.4 cm from carina. No acute osseous abnormality identified. IMPRESSION: Endotracheal tube 1.4 cm from carina. Increased hazy lung opacities compatible with pulmonary edema and small effusions bilaterally. Electronically Signed   By: Mitzi Hansen M.D.   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  Decompensated with progressive AKI (NSAIDS, galladium, shock) & respiratory distress requiring intubation.   ASSESSMENT / PLAN:  PULMONARY A: At Risk Respiratory Failure - in setting of volume  resuscitation with pancreatitis, pain  with splinting. High risk ALI Diffuse Bilateral Infiltrates - suspect edema, +/- ALI  11/08/2017 - > does YES  meet criteria for SB in setting of Acute Respiratory Failure due to ALI/Volume overload.    P:   SBT but doubt we can exdtubate PRVC otherwise    CARDIOVASCULAR A:  Tachycardia   SIRS   11/08/2017 ->  not on Pressor today  P:  Monitor hemodynamics in ICU  RENAL A:   AKI / Oliguric - in setting of volume depletion (N/V, poor intake), pancreatitis. Consistent w ATN, should recover with restoration of perfusion pressures.   11/08/2017 0- making urine/ On CRRT   P:   Per renakl .   GASTROINTESTINAL A:   Acute Moderately Severe Pancreatitis - concern for possible necrosis on MRCP  Gallstone Pancreatitis Abdominal Pain  Nausea / vomiting  11/08/2017 - tolerating trickle feeds  P:   PRN phenergan for nausea NPO  Place OGT  Trickle TF  Will need ERCP / cholecystectomy at some point  PPI  Appreciate GI / CCS  HEMATOLOGIC A:   Hx Anemia  P:  Trend CBC  Heparin for DVT prophylaxis  - PRBC for hgb </= 6.9gm%    - exceptions are   -  if ACS susepcted/confirmed then transfuse for hgb </= 8.0gm%,  or    -  active bleeding with hemodynamic instability, then transfuse regardless of hemoglobin value   At at all times try to transfuse 1 unit prbc as possible with exception of active hemorrhage    INFECTIOUS A:   Gallstone Pancreatitis - triglycerides 213 P: Empiric zosyn in setting of severe pancreatitis  Monitor fever curve / WBC trend  ENDOCRINE A:   Hyperglycemia - in setting of pancreatitis   P:   SSI Q4  Monitor for hypoglycemia  NEUROLOGIC A:   Pain - secondary to gallstone pancreatitis   11/08/2017 - on fent 200mcg gtt. No delirium  P:   RASS goal: 0 to -1  Fentanyl gtt for pain / sedation  PRN versed for sedation  Discontinue propofol with pancreatitis Passive ROM    FAMILY  - Updates 1/18 - :  Patients brother updated on plan of care.  Consent obtained for HD catheter, risks / benefits explained to brother Kathlene November(Mike).    None at bedside 11/08/2017   The patient is critically ill with multiple organ systems failure and requires high complexity decision making for assessment and support, frequent evaluation and titration of therapies, application of advanced monitoring technologies and extensive interpretation of multiple databases.   Critical Care Time devoted to patient care services described in this note is  30  Minutes. This time reflects time of care of this signee Dr Kalman ShanMurali Baily Hovanec. This critical care time does not reflect procedure time, or teaching time or supervisory time of PA/NP/Med student/Med Resident etc but could involve care discussion time    Dr. Kalman ShanMurali Nichele Slawson, M.D., [facility] Center[facility].C.P Pulmonary and Critical Care Medicine Staff Physician [facility] Pager: [phone], If no answer or between  15:00h - 7:00h: call [phone]  11/08/2017 8:00 AM

## 2017-11-08 NOTE — Progress Notes (Signed)
Dr. Signe ColtUpton updated on patients stabilized blood pressure and current Calcium level. RN requested fluid removal with CRRT, Dr. Signe ColtUpton to place an order.  RN will continue to monitor.

## 2017-11-09 ENCOUNTER — Inpatient Hospital Stay (HOSPITAL_COMMUNITY): Payer: BLUE CROSS/BLUE SHIELD

## 2017-11-09 LAB — GLUCOSE, CAPILLARY
GLUCOSE-CAPILLARY: 150 mg/dL — AB (ref 65–99)
GLUCOSE-CAPILLARY: 151 mg/dL — AB (ref 65–99)
GLUCOSE-CAPILLARY: 147 mg/dL — AB (ref 65–99)
GLUCOSE-CAPILLARY: 128 mg/dL — AB (ref 65–99)
Glucose-Capillary: 165 mg/dL — ABNORMAL HIGH (ref 65–99)
Glucose-Capillary: 139 mg/dL — ABNORMAL HIGH (ref 65–99)
Glucose-Capillary: 150 mg/dL — ABNORMAL HIGH (ref 65–99)
Glucose-Capillary: 134 mg/dL — ABNORMAL HIGH (ref 65–99)

## 2017-11-09 LAB — POCT ACTIVATED CLOTTING TIME
ACTIVATED CLOTTING TIME: 153 s
ACTIVATED CLOTTING TIME: 153 s
ACTIVATED CLOTTING TIME: 164 s
ACTIVATED CLOTTING TIME: 180 s
ACTIVATED CLOTTING TIME: 186 s
ACTIVATED CLOTTING TIME: 186 s
ACTIVATED CLOTTING TIME: 186 s
ACTIVATED CLOTTING TIME: 186 s
ACTIVATED CLOTTING TIME: 191 s
ACTIVATED CLOTTING TIME: 191 s
ACTIVATED CLOTTING TIME: 191 s
ACTIVATED CLOTTING TIME: 191 s
ACTIVATED CLOTTING TIME: 191 s
Activated Clotting Time: 158 seconds
Activated Clotting Time: 164 seconds
Activated Clotting Time: 169 seconds
Activated Clotting Time: 164 seconds
Activated Clotting Time: 169 seconds
Activated Clotting Time: 175 seconds
Activated Clotting Time: 186 seconds
Activated Clotting Time: 186 seconds
Activated Clotting Time: 191 seconds
Activated Clotting Time: 186 seconds
Activated Clotting Time: 186 seconds
Activated Clotting Time: 191 seconds
Activated Clotting Time: 191 seconds

## 2017-11-09 LAB — RENAL FUNCTION PANEL
ALBUMIN: 2.3 g/dL — AB (ref 3.5–5.0)
ANION GAP: 8 (ref 5–15)
Albumin: 2.2 g/dL — ABNORMAL LOW (ref 3.5–5.0)
Anion gap: 9 (ref 5–15)
BUN: 46 mg/dL — ABNORMAL HIGH (ref 6–20)
BUN: 34 mg/dL — ABNORMAL HIGH (ref 6–20)
CALCIUM: 6.7 mg/dL — AB (ref 8.9–10.3)
CHLORIDE: 104 mmol/L (ref 101–111)
CO2: 25 mmol/L (ref 22–32)
CO2: 26 mmol/L (ref 22–32)
Calcium: 6.2 mg/dL — CL (ref 8.9–10.3)
Chloride: 102 mmol/L (ref 101–111)
Creatinine, Ser: 2.68 mg/dL — ABNORMAL HIGH (ref 0.44–1.00)
Creatinine, Ser: 1.61 mg/dL — ABNORMAL HIGH (ref 0.44–1.00)
GFR calc Af Amer: 22 mL/min — ABNORMAL LOW (ref >60)
GFR calc Af Amer: 41 mL/min — ABNORMAL LOW (ref >60)
GFR calc non Af Amer: 19 mL/min — ABNORMAL LOW (ref >60)
GFR, EST NON AFRICAN AMERICAN: 35 mL/min — AB (ref >60)
GLUCOSE: 144 mg/dL — AB (ref 65–99)
Glucose, Bld: 161 mg/dL — ABNORMAL HIGH (ref 65–99)
PHOSPHORUS: 1.5 mg/dL — AB (ref 2.5–4.6)
POTASSIUM: 4.2 mmol/L (ref 3.5–5.1)
Phosphorus: 3.2 mg/dL (ref 2.5–4.6)
Potassium: 3.9 mmol/L (ref 3.5–5.1)
SODIUM: 136 mmol/L (ref 135–145)
Sodium: 138 mmol/L (ref 135–145)

## 2017-11-09 LAB — CBC WITH DIFFERENTIAL/PLATELET
BASOS ABS: 0.2 10*3/uL — AB (ref 0.0–0.1)
Basophils Relative: 1 %
Eosinophils Absolute: 0 10*3/uL (ref 0.0–0.7)
Eosinophils Relative: 0 %
HEMATOCRIT: 31.2 % — AB (ref 36.0–46.0)
Hemoglobin: 10.1 g/dL — ABNORMAL LOW (ref 12.0–15.0)
LYMPHS PCT: 3 %
Lymphs Abs: 0.5 10*3/uL — ABNORMAL LOW (ref 0.7–4.0)
MCH: 29.9 pg (ref 26.0–34.0)
MCHC: 32.4 g/dL (ref 30.0–36.0)
MCV: 92.3 fL (ref 78.0–100.0)
MONOS PCT: 6 %
Monocytes Absolute: 1 10*3/uL (ref 0.1–1.0)
NEUTROS ABS: 14.8 10*3/uL — AB (ref 1.7–7.7)
NEUTROS PCT: 90 %
Platelets: 259 10*3/uL (ref 150–400)
RBC: 3.38 MIL/uL — AB (ref 3.87–5.11)
RDW: 17 % — ABNORMAL HIGH (ref 11.5–15.5)
WBC MORPHOLOGY: INCREASED
WBC: 16.5 10*3/uL — AB (ref 4.0–10.5)

## 2017-11-09 LAB — MAGNESIUM: MAGNESIUM: 2.5 mg/dL — AB (ref 1.7–2.4)

## 2017-11-09 LAB — APTT: APTT: 41 s — AB (ref 24–36)

## 2017-11-09 MED ORDER — FENTANYL 40 MCG/ML IV SOLN
INTRAVENOUS | Status: DC
  Administered 2017-11-09: 1000 ug via INTRAVENOUS
  Administered 2017-11-09: 200 ug via INTRAVENOUS
  Filled 2017-11-09: qty 25

## 2017-11-09 MED ORDER — DIPHENHYDRAMINE HCL 50 MG/ML IJ SOLN: 12.5000 mg | Freq: Four times a day (QID) | INTRAMUSCULAR | Status: DC | PRN

## 2017-11-09 MED ORDER — SODIUM CHLORIDE 0.9% FLUSH: 9.0000 mL | INTRAVENOUS | Status: DC | PRN

## 2017-11-09 MED ORDER — NALOXONE HCL 0.4 MG/ML IJ SOLN: 0.4000 mg | INTRAMUSCULAR | Status: DC | PRN

## 2017-11-09 MED ORDER — CHLORHEXIDINE GLUCONATE 0.12 % MT SOLN
15.0000 mL | Freq: Two times a day (BID) | OROMUCOSAL | Status: DC
  Administered 2017-11-09 – 2017-11-30 (×38): 15 mL via OROMUCOSAL
  Filled 2017-11-09 (×25): qty 15

## 2017-11-09 MED ORDER — DIPHENHYDRAMINE HCL 12.5 MG/5ML PO ELIX: 12.5000 mg | ORAL_SOLUTION | Freq: Four times a day (QID) | ORAL | Status: DC | PRN

## 2017-11-09 MED ORDER — ONDANSETRON HCL 4 MG/2ML IJ SOLN
4.0000 mg | Freq: Four times a day (QID) | INTRAMUSCULAR | Status: DC | PRN
  Administered 2017-11-10: 4 mg via INTRAVENOUS
  Filled 2017-11-09: qty 2

## 2017-11-09 MED ORDER — PIPERACILLIN-TAZOBACTAM 3.375 G IVPB: 3.3750 g | Freq: Three times a day (TID) | INTRAVENOUS | Status: DC

## 2017-11-09 MED ORDER — FENTANYL CITRATE (PF) 100 MCG/2ML IJ SOLN
25.0000 ug | INTRAMUSCULAR | Status: DC | PRN
  Administered 2017-11-09 – 2017-11-10 (×7): 50 ug via INTRAVENOUS
  Filled 2017-11-09 (×7): qty 2

## 2017-11-09 MED ORDER — ONDANSETRON HCL 4 MG/2ML IJ SOLN
4.0000 mg | Freq: Four times a day (QID) | INTRAMUSCULAR | Status: DC | PRN
  Administered 2017-11-09: 4 mg via INTRAVENOUS
  Filled 2017-11-09: qty 2

## 2017-11-09 MED ORDER — ORAL CARE MOUTH RINSE
15.0000 mL | Freq: Two times a day (BID) | OROMUCOSAL | Status: DC
  Administered 2017-11-09 – 2017-12-04 (×42): 15 mL via OROMUCOSAL

## 2017-11-09 MED ORDER — PIPERACILLIN-TAZOBACTAM 3.375 G IVPB
3.3750 g | Freq: Four times a day (QID) | INTRAVENOUS | Status: DC
  Administered 2017-11-09 – 2017-11-11 (×8): 3.375 g via INTRAVENOUS
  Filled 2017-11-09 (×8): qty 50

## 2017-11-09 NOTE — Procedures (Signed)
Extubation Procedure Note  Patient Details:   Name: Emma Washington DOB: 25-Nov-1960 MRN: [id number]   Airway Documentation:     Evaluation  O2 sats: stable throughout Complications: No apparent complications Patient did tolerate procedure well. Bilateral Breath Sounds: Diminished, Coarse crackles   Yes  Dairl PonderWalters, Narmeen Kerper Nannette 11/09/2017, 10:07 AM  Positive cuff leak pre extubation. Placed on 4 lpm [place] post extubation.

## 2017-11-09 NOTE — Progress Notes (Signed)
PULMONARY / CRITICAL CARE MEDICINE   Name: Emma Washington MRN: [id number] DOB: [birth year]    ADMISSION DATE:  2017-12-03 CONSULTATION DATE:  11/04/2017  REFERRING MD:  Dr. 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 Other associated symptoms include nausea, vomiting and decreased PO intake (since 1/13). Pertinent PMHx significant for nephrolithiasis.   Initial labs show lipase >10,000, mildly elevated WBC of 11.5, creatinine 1.33, AST of 497 and ALT of 601. UA positive for ketones, protein, mucus and rare bacteria. UDS positive for opiates. CT renal stone study showed evidence of acute pancreatitis with pancreatic edema and peripancreatic fluid anteriorly to the body and tail of the pancreas as well as a 1 mm non obstructing stone in the upper pole of the left kidney.  Patient was admitted and GI consulted. GI suggested MRCP to be done today which shows cholelithiasis, possible choledocolithiasis, possible pancreatic necrosis of the body and tail, edema/inflammation in the retroperitoneal space and hepatomegaly. CXR today indicate low lung volumes and possible bilateral atelectasis vs PNA.  Patient was tachypnic with concern for decompensation which prompted a critical care consult. She is currently on protonix, zosyn, morphine, fluids, phenergan, reglan and zofran.      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   MR abdomen/MRCP 1/15 >>  cholelithiasis, possible choledocolithiasis, possible pancreatic necrosis of the body and tail, edema/inflammation in the retroperitoneal space and hepatomegaly  CXR 1/15 >> Atelectasis vs PNA L>R; low lung volumes, mild cardiopericadrial silhouette enlargement.  US Renal 1/17 >> normal renal US  CULTURES: None  ANTIBIOTICS: Zosyn 1/15 >>   SIGNIFICANT EVENTS: 1/14 Admit with abd pain 1/15 Feels better, some nausea/vomiting, progressive renal dysfunction, 9L resuscitation 1/18 Decompensated 0300, intubated, ETT 1/18 >>  10/21/16 -  Decompensated overnight > intubated.  13L+ positive.  Rising sr cr. Pt denies pain.  On propofol.  1/19 - intubated. On vent. On CRRT; makiong urine 25cc/h. On fent 200 -> folloiwing commands. Not on pressors.     SUBJECTIVE/OVERNIGHT/INTERVAL HX 1/20 - doinb well on SBT per RT x  90 minutes . RN says patient has pain on fent gtt. On CRRT ; renal now going negative per RN. CCXR small lung volumes. Did get hypoxemic duringg SBT on 35% fio2. RN says patient on fent gtt for pain. Paitent says pain improved and feels she is ready for extubation,. 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 and agrees patient can manage pca right now and can be extubated   VITAL SIGNS: BP (!) 101/46 (BP Location: Right Arm)   Pulse (!) 103   Temp 98.5 F (36.9 C) (Oral)   Resp 13   Ht 5\' 9"  (1.753 m)   Wt (!) 141.2 kg (311 lb 4.6 oz)   LMP 11/07/2011   SpO2 94%   BMI 45.97 kg/m   HEMODYNAMICS:    VENTILATOR SETTINGS: Vent Mode: CPAP;PSV FiO2 (%):  [35 %-40 %] 40 %  Set Rate:  [25 bmp] 25 bmp Vt Set:  [450 mL-452 mL] 450 mL PEEP:  [5 cmH20] 5 cmH20 Pressure Support:  [5 cmH20] 5 cmH20 Plateau Pressure:  [6 cmH20-11 cmH20] 6 cmH20  INTAKE / OUTPUT: I/O last 3 completed shifts: In: 3688 [I.V.:1841; Other:517; NG/GT:1030; IV Piggyback:300] Out: 1610 [Urine:966; Other:4231]  PHYSICAL EXAMINATION:   General Appearance:     Looks criticall ill OBESE - yes  Head:    Normocephalic, without obvious abnormality, atraumatic  Eyes:    PERRL - yes, conjunctiva/corneas - clear      Ears:    Normal external ear canals, both ears  Nose:   NG tube - no  Throat:  ETT TUBE - yes , OG tube - yes  Neck:   Supple,  No enlargement/tenderness/nodules     Lungs:     Clear to auscultation bilaterally, Ventilator   Synchrony - yes  Chest wall:    No deformity  Heart:    S1 and S2 normal, no murmur, CVP - no.  Pressors - no  Abdomen:     Soft, no masses, no organomegaly  Genitalia:    Not done  Rectal:   not done  Extremities:   Extremities- intact     Skin:   Intact in exposed areas . Sacral area - intact     Neurologic:   Sedation - fent gtt -> RASS - +1 . Moves all 4s - yes. CAM-ICU - neg . Orientation - x3+          PULMONARY Recent Labs  Lab 11/07/17 0432 11/07/17 0650  PHART 7.089* 7.191*  PCO2ART 65.4* 45.7  PO2ART 88.1 149*  HCO3 19.0* 16.9*  O2SAT 93.4 98.5    CBC Recent Labs  Lab 11/07/17 0933 11/08/17 0601 11/09/17 0414  HGB 11.0* 10.5* 10.1*  HCT 33.4* 32.4* 31.2*  WBC 10.4 11.6* 16.5*  PLT 244 265 259    COAGULATION No results for input(s): INR in the last 168 hours.  CARDIAC  No results for input(s): TROPONINI in the last 168 hours. No results for input(s): PROBNP in the last 168 hours.   CHEMISTRY Recent Labs  Lab 11/05/17 0242  11/06/17 0248 11/07/17 0334 11/07/17 0933 11/07/17 1529 11/08/17 0601 11/08/17 1600 11/09/17 0414  NA 137   < > 138 139 139 141 137 138 138  K 4.7   < > 4.0 4.4 4.7 4.5 3.9 4.0 4.2  CL 110   < > 109 110 110 111 105 106 104  CO2 19*   < > 19* 18* 18* 18* 23 24 25   GLUCOSE 206*   < > 182* 160* 164* 157* 161* 145* 161*  BUN 43*   < > 61* 67* 80* 83* 62* 52* 46*  CREATININE 3.20*   < > 4.51* 4.59* 5.16* 5.42* 3.75* 2.93* 2.68*  CALCIUM 5.1*   < > 4.2* <4.0* <4.0* <4.0* 4.9* 6.4* 6.2*  MG 1.9  --  1.7 2.2  --   --  2.5*  --  2.5*  PHOS  --    <  > 1.9* 4.7*  --  4.6 3.6 3.3 3.2   < > = values in this interval not displayed.   Estimated Creatinine Clearance: 36 mL/min (A) (by C-G formula based on SCr of 2.68 mg/dL (H)).   LIVER Recent Labs  Lab 11/04/17 1144 11/05/17 0242 11/06/17 0248 11/07/17 0334 11/07/17 1529 11/08/17 0601 11/08/17 1600 11/09/17 0414  AST 164* 92* 50* 49*  --  58*  --   --   ALT 390* 270* 151* 106*  --  75*  --   --   ALKPHOS 98 86 75 73  --  73  --   --   BILITOT 6.4* 5.3* 1.9* 0.9  --  1.0  --   --   PROT 5.5* 5.4* 5.5* 5.8*  --  5.6*  --   --   ALBUMIN 3.0* 2.8* 2.8* 2.6* 2.2* 2.3* 2.1* 2.2*     INFECTIOUS No results for input(s): LATICACIDVEN, PROCALCITON in the last 168 hours.   ENDOCRINE CBG (last 3)  Recent Labs    11/08/17 1918 11/08/17 2350 11/09/17 0312  GLUCAP 155* 159* 150*         IMAGING x48h  - image(s) personally visualized  -   highlighted in bold Dg Abd 1 View  Result Date: 11/07/2017 CLINICAL DATA:  Orogastric tube placement. EXAM: ABDOMEN - 1 VIEW COMPARISON:  None. FINDINGS: Orogastric tube extends into the stomach. Bowel gas pattern shows probable ileus. IMPRESSION: Orogastric tube extends into the stomach. Electronically Signed   By: Irish LackGlenn  Yamagata M.D.   On: 11/07/2017 15:23   Dg Chest Port 1 View  Result Date: 11/09/2017 CLINICAL DATA:  Hypoxia EXAM: PORTABLE CHEST 1 VIEW COMPARISON:  November 08, 2017 FINDINGS: Endotracheal tube tip is 3.2 cm above the carina. Nasogastric tube tip and side port are below the diaphragm. Central catheter tip is in the superior vena cava. No pneumothorax. There is persistent atelectatic change in the left base with small left pleural effusion. There is mild right base atelectasis. Heart is upper normal in size with pulmonary vascularity within normal limits. No adenopathy. No bone lesions. IMPRESSION: Tube and catheter positions as described without pneumothorax. Persistent bibasilar atelectasis, more on the left than on the  right, stable. No new opacity. Small left pleural effusion. Stable cardiac silhouette. Electronically Signed   By: Bretta BangWilliam  Woodruff III M.D.   On: 11/09/2017 07:16   Dg Chest Port 1 View  Result Date: 11/08/2017 CLINICAL DATA:  Hypoxia EXAM: PORTABLE CHEST 1 VIEW COMPARISON:  November 07, 2017 FINDINGS: Endotracheal tube tip is 3.7 cm above the carina. Nasogastric tube tip and side port are below the diaphragm. Central catheter tip is in the superior vena cava. No pneumothorax. There is atelectatic change in both lung bases, more on the left than on the right, stable. There is a minimal pleural effusion on each side. Heart is mildly enlarged with pulmonary vascularity within normal limits. No adenopathy. No bone lesions. IMPRESSION: Tube and catheter positions as described without evident pneumothorax. Bibasilar atelectasis, more on the left than on the right, with small bilateral pleural effusions. Stable cardiac prominence. Electronically Signed   By: Bretta BangWilliam  Woodruff III M.D.   On: 11/08/2017 07:13   Dg Chest Port 1 View  Result Date: 11/07/2017 CLINICAL DATA:  Central line placement. EXAM: PORTABLE CHEST 1 VIEW COMPARISON:  0240 hours FINDINGS: Interval placement of right jugular non tunneled hemodialysis catheter with the catheter tip at the level of the mid SVC. No pneumothorax. Endotracheal tube remains present with the tip approximately 3.5 cm above the carina. Gastric decompression tube extends into the stomach. Lungs show improved aeration bilaterally and decrease in pulmonary edema. Probable component of left pleural fluid. IMPRESSION: Non tunneled dialysis catheter tip in SVC. No pneumothorax identified after placement. Lungs show improved aeration with decrease in pulmonary edema. Electronically Signed   By: Irish LackGlenn  Yamagata M.D.   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  Decompensated with  progressive AKI (NSAIDS, galladium, shock) & respiratory distress requiring intubation.   ASSESSMENT / PLAN:  PULMONARY A: At Risk Respiratory Failure - in setting of volume resuscitation with pancreatitis, pain with splinting. High risk ALI Diffuse Bilateral Infiltrates - suspect edema, +/- ALI   11/09/2017 - > does YES meet criteria for SBT/Extubation in setting of Acute Respiratory Failure due to GS pancreatit   P:   Extubate to fentanyl PCA    CARDIOVASCULAR A:  Tachycardia   SIRS   11/09/2017 ->  not on Pressor today  P:  Monitor hemodynamics in ICU  RENAL A:   AKI / Oliguric - in setting of volume depletion (N/V, poor intake), pancreatitis. Consistent w ATN, should recover with restoration of perfusion pressures.   11/09/2017 0- making urine 10-30cc/h/ On CRRT   P:   Per renal .   GASTROINTESTINAL A:   Acute Moderately Severe Pancreatitis - concern for possible necrosis on MRCP  Gallstone Pancreatitis Abdominal Pain  Nausea / vomiting  11/09/2017 - tolerating trickle feeds  P:   PRN phenergan for nausea NPO  Place OGT  Trickle TF  - hold fot extubation Will need ERCP / cholecystectomy at some point  PPI  Appreciate GI / CCS  HEMATOLOGIC A:   Hx Anemia  P:  Trend CBC  Heparin for DVT prophylaxis  - PRBC for hgb </= 6.9gm%    - exceptions are   -  if ACS susepcted/confirmed then transfuse for hgb </= 8.0gm%,  or    -  active bleeding with hemodynamic instability, then transfuse regardless of hemoglobin value   At at all times try to transfuse 1 unit prbc as possible with exception of active hemorrhage    INFECTIOUS A:   Gallstone Pancreatitis - triglycerides 213 P: Empiric zosyn in setting of severe pancreatitis ; evaluate on 08/10/18 for dc v continue Monitor fever curve / WBC trend  ENDOCRINE A:   Hyperglycemia - in setting of pancreatitis   P:   SSI Q4  Monitor for hypoglycemia  NEUROLOGIC A:   Pain - secondary to gallstone  pancreatitis   11/09/2017 - on fent gtt. No delirium  P:   RASS goal: 0 to -1  Fentanyl gtt for pain / sedation  -> change to PCA for extubation and pain contro PRN versed for sedation  Passive ROM    FAMILY  - Updates 1/18 - : Patients brother updated on plan of care.    On 11/09/17 - brother, borther's wife and close friend who works in hospice with patient as a Charity fundraiser also updated.      The patient is critically ill with multiple organ systems failure and requires high complexity decision making for assessment and support, frequent evaluation and titration of therapies, application of advanced monitoring technologies and extensive interpretation of multiple databases.   Critical Care Time devoted to patient care services described in this note is  30  Minutes. This time reflects time of care of this signee Dr Kalman Shan. This critical care time does not reflect procedure time, or teaching time or supervisory time of PA/NP/Med student/Med Resident etc but could involve care discussion time    Dr. Kalman Shan, M.D., [facility].C.P Pulmonary and Critical Care Medicine Staff Physician [facility] Pager: [phone], If no answer or between  15:00h - 7:00h: call [phone]  [id number]  11/09/2017 9:09 AM

## 2017-11-09 NOTE — Progress Notes (Signed)
MD discontinued continuous Fentanly drip. Wasted 165 of Fentanyl in sink with Nicoletta DressBrianna McNabb RN.

## 2017-11-09 NOTE — Progress Notes (Signed)
Subjective: Interval History: Extub , feels a little better.  Objective: Vital signs in last 24 hours: Temp:  [98.2 F (36.8 C)-100.5 F (38.1 C)] 98.5 F (36.9 C) (01/20 0800) Pulse Rate:  [93-117] 93 (01/20 1200) Resp:  [13-30] 14 (01/20 1200) BP: (95-150)/(18-107) 100/50 (01/20 1100) SpO2:  [90 %-100 %] 97 % (01/20 1200) FiO2 (%):  [35 %-40 %] 40 % (01/20 0735) Weight:  [141.2 kg (311 lb 4.6 oz)] 141.2 kg (311 lb 4.6 oz) (01/20 0500) Weight change: 0 kg (0 lb)  Intake/Output from previous day: 01/19 0701 - 01/20 0700 In: 2802.4 [I.V.:1485.4; NG/GT:700; IV Piggyback:200] Out: 4199 [Urine:644] Intake/Output this shift: Total I/O In: 50 [IV Piggyback:50] Out: 855 [Other:855]  General appearance: cooperative, morbidly obese and slowed mentation Neck: thyroid not enlarged, symmetric, no tenderness/mass/nodules Resp: diminished breath sounds bilaterally and rales bibasilar Cardio: S1, S2 normal GI: distended does have some bs,  Extremities: edema 3+  Lab Results: Recent Labs    11/08/17 0601 11/09/17 0414  WBC 11.6* 16.5*  HGB 10.5* 10.1*  HCT 32.4* 31.2*  PLT 265 259   BMET:  Recent Labs    11/08/17 1600 11/09/17 0414  NA 138 138  K 4.0 4.2  CL 106 104  CO2 24 25  GLUCOSE 145* 161*  BUN 52* 46*  CREATININE 2.93* 2.68*  CALCIUM 6.4* 6.2*   No results for input(s): PTH in the last 72 hours. Iron Studies: No results for input(s): IRON, TIBC, TRANSFERRIN, FERRITIN in the last 72 hours.  Studies/Results: Dg Abd 1 View  Result Date: 11/07/2017 CLINICAL DATA:  Orogastric tube placement. EXAM: ABDOMEN - 1 VIEW COMPARISON:  None. FINDINGS: Orogastric tube extends into the stomach. Bowel gas pattern shows probable ileus. IMPRESSION: Orogastric tube extends into the stomach. Electronically Signed   By: Irish Lack M.D.   On: 11/07/2017 15:23   Dg Chest Port 1 View  Result Date: 11/09/2017 CLINICAL DATA:  Hypoxia EXAM: PORTABLE CHEST 1 VIEW COMPARISON:   November 08, 2017 FINDINGS: Endotracheal tube tip is 3.2 cm above the carina. Nasogastric tube tip and side port are below the diaphragm. Central catheter tip is in the superior vena cava. No pneumothorax. There is persistent atelectatic change in the left base with small left pleural effusion. There is mild right base atelectasis. Heart is upper normal in size with pulmonary vascularity within normal limits. No adenopathy. No bone lesions. IMPRESSION: Tube and catheter positions as described without pneumothorax. Persistent bibasilar atelectasis, more on the left than on the right, stable. No new opacity. Small left pleural effusion. Stable cardiac silhouette. Electronically Signed   By: Bretta Bang III M.D.   On: 11/09/2017 07:16   Dg Chest Port 1 View  Result Date: 11/08/2017 CLINICAL DATA:  Hypoxia EXAM: PORTABLE CHEST 1 VIEW COMPARISON:  November 07, 2017 FINDINGS: Endotracheal tube tip is 3.7 cm above the carina. Nasogastric tube tip and side port are below the diaphragm. Central catheter tip is in the superior vena cava. No pneumothorax. There is atelectatic change in both lung bases, more on the left than on the right, stable. There is a minimal pleural effusion on each side. Heart is mildly enlarged with pulmonary vascularity within normal limits. No adenopathy. No bone lesions. IMPRESSION: Tube and catheter positions as described without evident pneumothorax. Bibasilar atelectasis, more on the left than on the right, with small bilateral pleural effusions. Stable cardiac prominence. Electronically Signed   By: Bretta Bang III M.D.   On: 11/08/2017 07:13  Dg Chest Port 1 View  Result Date: 11/07/2017 CLINICAL DATA:  Central line placement. EXAM: PORTABLE CHEST 1 VIEW COMPARISON:  0240 hours FINDINGS: Interval placement of right jugular non tunneled hemodialysis catheter with the catheter tip at the level of the mid SVC. No pneumothorax. Endotracheal tube remains present with the tip  approximately 3.5 cm above the carina. Gastric decompression tube extends into the stomach. Lungs show improved aeration bilaterally and decrease in pulmonary edema. Probable component of left pleural fluid. IMPRESSION: Non tunneled dialysis catheter tip in SVC. No pneumothorax identified after placement. Lungs show improved aeration with decrease in pulmonary edema. Electronically Signed   By: Irish LackGlenn  Yamagata M.D.   On: 11/07/2017 15:22    I have reviewed the patient's current medications.  Assessment/Plan: 1 AKI good solute clearance/acid/base/K with CRRT. Is making some urine but no signif clearance. Now net neg and needs 11 L off.   2 Resp failure better. Will cont neg fluid balance to assist 3 Obesity. 4 Gallstone Necrotizing pancreatitis  a little bowel activity 5 hypocalcemia some better. P Cont CRRT, lower vol, low vol feeding??   LOS: 6 days   Emma Washington 11/09/2017,12:06 PM

## 2017-11-10 ENCOUNTER — Inpatient Hospital Stay (HOSPITAL_COMMUNITY): Payer: BLUE CROSS/BLUE SHIELD

## 2017-11-10 DIAGNOSIS — E875 Hyperkalemia: Secondary | ICD-10-CM

## 2017-11-10 DIAGNOSIS — D649 Anemia, unspecified: Secondary | ICD-10-CM

## 2017-11-10 DIAGNOSIS — K8511 Biliary acute pancreatitis with uninfected necrosis: Principal | ICD-10-CM

## 2017-11-10 LAB — RENAL FUNCTION PANEL
ALBUMIN: 2.2 g/dL — AB (ref 3.5–5.0)
ALBUMIN: 2 g/dL — AB (ref 3.5–5.0)
ANION GAP: 8 (ref 5–15)
Anion gap: 9 (ref 5–15)
BUN: 32 mg/dL — AB (ref 6–20)
BUN: 33 mg/dL — AB (ref 6–20)
CALCIUM: 6.9 mg/dL — AB (ref 8.9–10.3)
CHLORIDE: 105 mmol/L (ref 101–111)
CO2: 26 mmol/L (ref 22–32)
CO2: 25 mmol/L (ref 22–32)
Calcium: 6.7 mg/dL — ABNORMAL LOW (ref 8.9–10.3)
Chloride: 105 mmol/L (ref 101–111)
Creatinine, Ser: 1.46 mg/dL — ABNORMAL HIGH (ref 0.44–1.00)
Creatinine, Ser: 1.67 mg/dL — ABNORMAL HIGH (ref 0.44–1.00)
GFR calc Af Amer: 46 mL/min — ABNORMAL LOW (ref >60)
GFR calc Af Amer: 39 mL/min — ABNORMAL LOW (ref >60)
GFR calc non Af Amer: 33 mL/min — ABNORMAL LOW (ref >60)
GFR, EST NON AFRICAN AMERICAN: 39 mL/min — AB (ref >60)
GLUCOSE: 150 mg/dL — AB (ref 65–99)
GLUCOSE: 158 mg/dL — AB (ref 65–99)
PHOSPHORUS: 1.5 mg/dL — AB (ref 2.5–4.6)
POTASSIUM: 3.9 mmol/L (ref 3.5–5.1)
Phosphorus: 1.7 mg/dL — ABNORMAL LOW (ref 2.5–4.6)
Potassium: 3.9 mmol/L (ref 3.5–5.1)
SODIUM: 139 mmol/L (ref 135–145)
Sodium: 139 mmol/L (ref 135–145)

## 2017-11-10 LAB — POCT ACTIVATED CLOTTING TIME
ACTIVATED CLOTTING TIME: 186 s
ACTIVATED CLOTTING TIME: 202 s
Activated Clotting Time: 169 seconds

## 2017-11-10 LAB — CBC WITH DIFFERENTIAL/PLATELET
BASOS PCT: 2 %
Basophils Absolute: 0.5 10*3/uL — ABNORMAL HIGH (ref 0.0–0.1)
EOS ABS: 0 10*3/uL (ref 0.0–0.7)
EOS PCT: 0 %
HEMATOCRIT: 31.6 % — AB (ref 36.0–46.0)
Hemoglobin: 10.4 g/dL — ABNORMAL LOW (ref 12.0–15.0)
LYMPHS ABS: 0.9 10*3/uL (ref 0.7–4.0)
Lymphocytes Relative: 4 %
MCH: 29.8 pg (ref 26.0–34.0)
MCHC: 32.9 g/dL (ref 30.0–36.0)
MCV: 90.5 fL (ref 78.0–100.0)
MONO ABS: 0.9 10*3/uL (ref 0.1–1.0)
Monocytes Relative: 4 %
Neutro Abs: 21.3 10*3/uL — ABNORMAL HIGH (ref 1.7–7.7)
Neutrophils Relative %: 90 %
PLATELETS: 280 10*3/uL (ref 150–400)
RBC: 3.49 MIL/uL — ABNORMAL LOW (ref 3.87–5.11)
RDW: 16.3 % — AB (ref 11.5–15.5)
WBC: 23.6 10*3/uL — AB (ref 4.0–10.5)

## 2017-11-10 LAB — HEPATIC FUNCTION PANEL
ALBUMIN: 2.2 g/dL — AB (ref 3.5–5.0)
ALK PHOS: 100 U/L (ref 38–126)
ALT: 59 U/L — ABNORMAL HIGH (ref 14–54)
AST: 55 U/L — AB (ref 15–41)
BILIRUBIN TOTAL: 1.2 mg/dL (ref 0.3–1.2)
Bilirubin, Direct: 0.5 mg/dL (ref 0.1–0.5)
Indirect Bilirubin: 0.7 mg/dL (ref 0.3–0.9)
Total Protein: 5.8 g/dL — ABNORMAL LOW (ref 6.5–8.1)

## 2017-11-10 LAB — GLUCOSE, CAPILLARY
GLUCOSE-CAPILLARY: 141 mg/dL — AB (ref 65–99)
GLUCOSE-CAPILLARY: 128 mg/dL — AB (ref 65–99)
Glucose-Capillary: 135 mg/dL — ABNORMAL HIGH (ref 65–99)
Glucose-Capillary: 146 mg/dL — ABNORMAL HIGH (ref 65–99)
Glucose-Capillary: 130 mg/dL — ABNORMAL HIGH (ref 65–99)
Glucose-Capillary: 141 mg/dL — ABNORMAL HIGH (ref 65–99)
Glucose-Capillary: 131 mg/dL — ABNORMAL HIGH (ref 65–99)

## 2017-11-10 LAB — MAGNESIUM: Magnesium: 2.6 mg/dL — ABNORMAL HIGH (ref 1.7–2.4)

## 2017-11-10 LAB — APTT: aPTT: 67 seconds — ABNORMAL HIGH (ref 24–36)

## 2017-11-10 LAB — LIPASE, BLOOD: Lipase: 22 U/L (ref 11–51)

## 2017-11-10 MED ORDER — VITAL HIGH PROTEIN PO LIQD
1000.0000 mL | ORAL | Status: DC
  Administered 2017-11-10 (×2): 1000 mL
  Filled 2017-11-10 (×4): qty 1000

## 2017-11-10 MED ORDER — FENTANYL CITRATE (PF) 100 MCG/2ML IJ SOLN
50.0000 ug | INTRAMUSCULAR | Status: DC | PRN
Start: 2017-11-10 — End: 2017-11-10
  Administered 2017-11-10: 75 ug via INTRAVENOUS
  Filled 2017-11-10: qty 2

## 2017-11-10 MED ORDER — SODIUM GLYCEROPHOSPHATE 1 MMOLE/ML IV SOLN
20.0000 mmol | Freq: Once | INTRAVENOUS | Status: AC
  Administered 2017-11-10: 20 mmol via INTRAVENOUS
  Filled 2017-11-10: qty 20

## 2017-11-10 MED ORDER — FENTANYL CITRATE (PF) 100 MCG/2ML IJ SOLN
25.0000 ug | INTRAMUSCULAR | Status: DC | PRN
  Administered 2017-11-10 – 2017-11-11 (×5): 100 ug via INTRAVENOUS
  Filled 2017-11-10 (×5): qty 2

## 2017-11-10 MED ORDER — HALOPERIDOL LACTATE 5 MG/ML IJ SOLN
1.0000 mg | INTRAMUSCULAR | Status: DC | PRN
  Administered 2017-11-11 – 2017-11-17 (×15): 1 mg via INTRAVENOUS
  Filled 2017-11-10 (×16): qty 1

## 2017-11-10 NOTE — Consult Note (Signed)
[facility] Surgery Consult Note  Emma Washington 06/07/1961  [id number].    Requesting MD: Fuller Plan Chief Complaint/Reason for Consult: Necrotizing pancreatitis HPI:  Patient is a 57 y/o female who presented to the ED 1/14 with flank pain, hemturia and dysuria. Found to have severe gallstone pancreatitis with lipase >10,000. Patient also seen to have non-obstructing stone in left kidney. MRCP done 1/15 and showed possible necrosis of the pancreatic body and tail. Last lipase was 850 on 1/16. Patient with severe abdominal pain that is diffuse and requiring IV pain medicine and still uncontrolled.   ROS: Review of Systems  Reason unable to perform ROS: ROS limited by patient acuity   Gastrointestinal: Positive for abdominal pain.    Family History  Problem Relation Age of Onset  . Pancreatitis Brother     Past Medical History:  Diagnosis Date  . Anemia   . GERD (gastroesophageal reflux disease)   . Hypertension   . Nephrolithiasis     Past Surgical History:  Procedure Laterality Date  . HAND SURGERY    . HERNIA REPAIR      Social History:  reports that  has never smoked. she has never used smokeless tobacco. She reports that she does not drink alcohol or use drugs.  Allergies:  Allergies  Allergen Reactions  . Demerol Other (See Comments)    Severe hypotension  . Meperidine     Other reaction(s): Hypotension, Hypotension (ALLERGY/intolerance)  . Other Anaphylaxis    Pineapple  . Pineapple Swelling and Anaphylaxis    Medications Prior to Admission  Medication Sig Dispense Refill  . ibuprofen (ADVIL,MOTRIN) 200 MG tablet Take 1,000 mg by mouth once as needed. For pain    . lisinopril-hydrochlorothiazide (PRINZIDE,ZESTORETIC) 20-25 MG tablet Take 1 tablet by mouth daily as needed (hypertension).     . promethazine (PHENERGAN) 25 MG tablet Take 25 mg by mouth every 6 (six) hours as needed for nausea or vomiting.      Blood pressure (!) 121/57, pulse 100, temperature  99.6 F (37.6 C), temperature source Axillary, resp. rate (!) 24, height '5\' 9"'  (1.753 m), weight (!) 136.7 kg (301 lb 5.9 oz), last menstrual period 11/07/2011, SpO2 98 %. Physical Exam: Physical Exam  Constitutional: She is oriented to person, place, and time. She appears ill. She appears distressed (in a lot of pain ). Nasal cannula in place.  Patient with dialysis catheter in right neck, on CRRT.   HENT:  Head: Normocephalic and atraumatic.  Right Ear: External ear normal.  Left Ear: External ear normal.  Nose: Nose normal.  Eyes: Lids are normal.  Patient with eyes closed   Neck: Phonation normal. Neck supple.  Cardiovascular: Regular rhythm. Tachycardia present.  No LE edema   Pulmonary/Chest: Effort normal and breath sounds normal.  Abdominal: Soft. She exhibits no distension. Bowel sounds are decreased. There is generalized tenderness. There is no rigidity, no rebound and no guarding.  Musculoskeletal:  No gross deformity of BL upper and lower extremities. ROM grossly intact in BL upper and lower extremities  Neurological: She is alert and oriented to person, place, and time.  Skin: Skin is warm and intact. She is diaphoretic.  Psychiatric: Her speech is normal and behavior is normal. Her mood appears anxious (likely related to pain ).    Results for orders placed or performed during the hospital encounter of 11/07/2017 (from the past 48 hour(s))  POCT Activated clotting time     Status: None   Collection Time: 11/08/17 11:07 AM  Result Value Ref Range   Activated Clotting Time 180 seconds  Glucose, capillary     Status: Abnormal   Collection Time: 11/08/17 11:58 AM  Result Value Ref Range   Glucose-Capillary 135 (H) 65 - 99 mg/dL   Comment 1 Notify RN    Comment 2 Document in Chart   POCT Activated clotting time     Status: None   Collection Time: 11/08/17 12:00 PM  Result Value Ref Range   Activated Clotting Time 175 seconds  POCT Activated clotting time     Status:  None   Collection Time: 11/08/17  1:01 PM  Result Value Ref Range   Activated Clotting Time 186 seconds  POCT Activated clotting time     Status: None   Collection Time: 11/08/17  2:59 PM  Result Value Ref Range   Activated Clotting Time 142 seconds  Renal function panel (daily at 1600)     Status: Abnormal   Collection Time: 11/08/17  4:00 PM  Result Value Ref Range   Sodium 138 135 - 145 mmol/L   Potassium 4.0 3.5 - 5.1 mmol/L   Chloride 106 101 - 111 mmol/L   CO2 24 22 - 32 mmol/L   Glucose, Bld 145 (H) 65 - 99 mg/dL   BUN 52 (H) 6 - 20 mg/dL   Creatinine, Ser 2.93 (H) 0.44 - 1.00 mg/dL   Calcium 6.4 (LL) 8.9 - 10.3 mg/dL    Comment: CRITICAL RESULT CALLED TO, READ BACK BY AND VERIFIED WITH: KOONTZ,A AT 4:55PM ON 11/08/17 BY FESTERMAN,C    Phosphorus 3.3 2.5 - 4.6 mg/dL   Albumin 2.1 (L) 3.5 - 5.0 g/dL   GFR calc non Af Amer 17 (L) >60 mL/min   GFR calc Af Amer 20 (L) >60 mL/min    Comment: (NOTE) The eGFR has been calculated using the CKD EPI equation. This calculation has not been validated in all clinical situations. eGFR's persistently <60 mL/min signify possible Chronic Kidney Disease.    Anion gap 8 5 - 15  POCT Activated clotting time     Status: None   Collection Time: 11/08/17  4:00 PM  Result Value Ref Range   Activated Clotting Time 158 seconds  Glucose, capillary     Status: Abnormal   Collection Time: 11/08/17  4:04 PM  Result Value Ref Range   Glucose-Capillary 114 (H) 65 - 99 mg/dL   Comment 1 Notify RN    Comment 2 Document in Chart   POCT Activated clotting time     Status: None   Collection Time: 11/08/17  5:04 PM  Result Value Ref Range   Activated Clotting Time 136 seconds  POCT Activated clotting time     Status: None   Collection Time: 11/08/17  6:04 PM  Result Value Ref Range   Activated Clotting Time 153 seconds  POCT Activated clotting time     Status: None   Collection Time: 11/08/17  7:07 PM  Result Value Ref Range   Activated  Clotting Time 158 seconds  Glucose, capillary     Status: Abnormal   Collection Time: 11/08/17  7:18 PM  Result Value Ref Range   Glucose-Capillary 155 (H) 65 - 99 mg/dL   Comment 1 Notify RN    Comment 2 Document in Chart   POCT Activated clotting time     Status: None   Collection Time: 11/08/17  8:08 PM  Result Value Ref Range   Activated Clotting Time 153 seconds  POCT Activated clotting  time     Status: None   Collection Time: 11/08/17  9:12 PM  Result Value Ref Range   Activated Clotting Time 164 seconds  POCT Activated clotting time     Status: None   Collection Time: 11/08/17 10:02 PM  Result Value Ref Range   Activated Clotting Time 169 seconds  POCT Activated clotting time     Status: None   Collection Time: 11/08/17 11:05 PM  Result Value Ref Range   Activated Clotting Time 164 seconds  Glucose, capillary     Status: Abnormal   Collection Time: 11/08/17 11:50 PM  Result Value Ref Range   Glucose-Capillary 159 (H) 65 - 99 mg/dL   Comment 1 Notify RN    Comment 2 Document in Chart   POCT Activated clotting time     Status: None   Collection Time: 11/09/17 12:19 AM  Result Value Ref Range   Activated Clotting Time 164 seconds  POCT Activated clotting time     Status: None   Collection Time: 11/09/17  1:05 AM  Result Value Ref Range   Activated Clotting Time 169 seconds  POCT Activated clotting time     Status: None   Collection Time: 11/09/17  2:08 AM  Result Value Ref Range   Activated Clotting Time 191 seconds  POCT Activated clotting time     Status: None   Collection Time: 11/09/17  3:06 AM  Result Value Ref Range   Activated Clotting Time 186 seconds  Glucose, capillary     Status: Abnormal   Collection Time: 11/09/17  3:12 AM  Result Value Ref Range   Glucose-Capillary 150 (H) 65 - 99 mg/dL   Comment 1 Notify RN    Comment 2 Document in Chart   POCT Activated clotting time     Status: None   Collection Time: 11/09/17  4:11 AM  Result Value Ref Range    Activated Clotting Time 180 seconds  Renal function panel (daily at 0500)     Status: Abnormal   Collection Time: 11/09/17  4:14 AM  Result Value Ref Range   Sodium 138 135 - 145 mmol/L   Potassium 4.2 3.5 - 5.1 mmol/L   Chloride 104 101 - 111 mmol/L   CO2 25 22 - 32 mmol/L   Glucose, Bld 161 (H) 65 - 99 mg/dL   BUN 46 (H) 6 - 20 mg/dL   Creatinine, Ser 2.68 (H) 0.44 - 1.00 mg/dL   Calcium 6.2 (LL) 8.9 - 10.3 mg/dL    Comment: CRITICAL RESULT CALLED TO, READ BACK BY AND VERIFIED WITH: T BANKS,RN '@[id number]'  11/09/17 MKELLY    Phosphorus 3.2 2.5 - 4.6 mg/dL   Albumin 2.2 (L) 3.5 - 5.0 g/dL   GFR calc non Af Amer 19 (L) >60 mL/min   GFR calc Af Amer 22 (L) >60 mL/min    Comment: (NOTE) The eGFR has been calculated using the CKD EPI equation. This calculation has not been validated in all clinical situations. eGFR's persistently <60 mL/min signify possible Chronic Kidney Disease.    Anion gap 9 5 - 15  Magnesium     Status: Abnormal   Collection Time: 11/09/17  4:14 AM  Result Value Ref Range   Magnesium 2.5 (H) 1.7 - 2.4 mg/dL  APTT     Status: Abnormal   Collection Time: 11/09/17  4:14 AM  Result Value Ref Range   aPTT 41 (H) 24 - 36 seconds    Comment:  IF BASELINE aPTT IS ELEVATED, SUGGEST PATIENT RISK ASSESSMENT BE USED TO DETERMINE APPROPRIATE ANTICOAGULANT THERAPY.   CBC with Differential/Platelet     Status: Abnormal   Collection Time: 11/09/17  4:14 AM  Result Value Ref Range   WBC 16.5 (H) 4.0 - 10.5 K/uL   RBC 3.38 (L) 3.87 - 5.11 MIL/uL   Hemoglobin 10.1 (L) 12.0 - 15.0 g/dL   HCT 31.2 (L) 36.0 - 46.0 %   MCV 92.3 78.0 - 100.0 fL   MCH 29.9 26.0 - 34.0 pg   MCHC 32.4 30.0 - 36.0 g/dL   RDW 17.0 (H) 11.5 - 15.5 %   Platelets 259 150 - 400 K/uL   Neutrophils Relative % 90 %   Lymphocytes Relative 3 %   Monocytes Relative 6 %   Eosinophils Relative 0 %   Basophils Relative 1 %   Neutro Abs 14.8 (H) 1.7 - 7.7 K/uL   Lymphs Abs 0.5 (L) 0.7 - 4.0 K/uL    Monocytes Absolute 1.0 0.1 - 1.0 K/uL   Eosinophils Absolute 0.0 0.0 - 0.7 K/uL   Basophils Absolute 0.2 (H) 0.0 - 0.1 K/uL   WBC Morphology INCREASED BANDS (>20% BANDS)     Comment: MILD LEFT SHIFT (1-5% METAS, OCC MYELO, OCC BANDS) TOXIC GRANULATION   POCT Activated clotting time     Status: None   Collection Time: 11/09/17  4:56 AM  Result Value Ref Range   Activated Clotting Time 175 seconds  POCT Activated clotting time     Status: None   Collection Time: 11/09/17  5:57 AM  Result Value Ref Range   Activated Clotting Time 186 seconds  POCT Activated clotting time     Status: None   Collection Time: 11/09/17  6:58 AM  Result Value Ref Range   Activated Clotting Time 191 seconds  Glucose, capillary     Status: Abnormal   Collection Time: 11/09/17  7:50 AM  Result Value Ref Range   Glucose-Capillary 151 (H) 65 - 99 mg/dL   Comment 1 Notify RN    Comment 2 Document in Chart   POCT Activated clotting time     Status: None   Collection Time: 11/09/17  7:55 AM  Result Value Ref Range   Activated Clotting Time 186 seconds  POCT Activated clotting time     Status: None   Collection Time: 11/09/17  9:02 AM  Result Value Ref Range   Activated Clotting Time 186 seconds  POCT Activated clotting time     Status: None   Collection Time: 11/09/17  9:54 AM  Result Value Ref Range   Activated Clotting Time 191 seconds  POCT Activated clotting time     Status: None   Collection Time: 11/09/17 11:06 AM  Result Value Ref Range   Activated Clotting Time 186 seconds  POCT Activated clotting time     Status: None   Collection Time: 11/09/17 11:56 AM  Result Value Ref Range   Activated Clotting Time 191 seconds  Glucose, capillary     Status: Abnormal   Collection Time: 11/09/17 12:09 PM  Result Value Ref Range   Glucose-Capillary 147 (H) 65 - 99 mg/dL  POCT Activated clotting time     Status: None   Collection Time: 11/09/17  1:03 PM  Result Value Ref Range   Activated Clotting  Time 186 seconds  POCT Activated clotting time     Status: None   Collection Time: 11/09/17  2:11 PM  Result Value Ref Range  Activated Clotting Time 186 seconds  POCT Activated clotting time     Status: None   Collection Time: 11/09/17  3:13 PM  Result Value Ref Range   Activated Clotting Time 153 seconds  Glucose, capillary     Status: Abnormal   Collection Time: 11/09/17  3:55 PM  Result Value Ref Range   Glucose-Capillary 150 (H) 65 - 99 mg/dL  Renal function panel (daily at 1600)     Status: Abnormal   Collection Time: 11/09/17  4:00 PM  Result Value Ref Range   Sodium 136 135 - 145 mmol/L   Potassium 3.9 3.5 - 5.1 mmol/L   Chloride 102 101 - 111 mmol/L   CO2 26 22 - 32 mmol/L   Glucose, Bld 144 (H) 65 - 99 mg/dL   BUN 34 (H) 6 - 20 mg/dL   Creatinine, Ser 1.61 (H) 0.44 - 1.00 mg/dL    Comment: DELTA CHECK NOTED   Calcium 6.7 (L) 8.9 - 10.3 mg/dL   Phosphorus 1.5 (L) 2.5 - 4.6 mg/dL   Albumin 2.3 (L) 3.5 - 5.0 g/dL   GFR calc non Af Amer 35 (L) >60 mL/min   GFR calc Af Amer 41 (L) >60 mL/min    Comment: (NOTE) The eGFR has been calculated using the CKD EPI equation. This calculation has not been validated in all clinical situations. eGFR's persistently <60 mL/min signify possible Chronic Kidney Disease.    Anion gap 8 5 - 15  POCT Activated clotting time     Status: None   Collection Time: 11/09/17  4:05 PM  Result Value Ref Range   Activated Clotting Time 186 seconds  POCT Activated clotting time     Status: None   Collection Time: 11/09/17  5:06 PM  Result Value Ref Range   Activated Clotting Time 191 seconds  POCT Activated clotting time     Status: None   Collection Time: 11/09/17  6:12 PM  Result Value Ref Range   Activated Clotting Time 191 seconds  POCT Activated clotting time     Status: None   Collection Time: 11/09/17  6:58 PM  Result Value Ref Range   Activated Clotting Time 191 seconds  Glucose, capillary     Status: Abnormal   Collection Time:  11/09/17  7:54 PM  Result Value Ref Range   Glucose-Capillary 134 (H) 65 - 99 mg/dL   Comment 1 Notify RN    Comment 2 Document in Chart   Glucose, capillary     Status: Abnormal   Collection Time: 11/09/17 11:04 PM  Result Value Ref Range   Glucose-Capillary 128 (H) 65 - 99 mg/dL   Comment 1 Notify RN    Comment 2 Document in Chart   POCT Activated clotting time     Status: None   Collection Time: 11/09/17 11:06 PM  Result Value Ref Range   Activated Clotting Time 191 seconds  Glucose, capillary     Status: Abnormal   Collection Time: 11/10/17  3:14 AM  Result Value Ref Range   Glucose-Capillary 146 (H) 65 - 99 mg/dL   Comment 1 Notify RN    Comment 2 Document in Chart   POCT Activated clotting time     Status: None   Collection Time: 11/10/17  4:11 AM  Result Value Ref Range   Activated Clotting Time 169 seconds  Renal function panel (daily at 0500)     Status: Abnormal   Collection Time: 11/10/17  4:51 AM  Result Value Ref  Range   Sodium 139 135 - 145 mmol/L   Potassium 3.9 3.5 - 5.1 mmol/L   Chloride 105 101 - 111 mmol/L   CO2 26 22 - 32 mmol/L   Glucose, Bld 150 (H) 65 - 99 mg/dL   BUN 32 (H) 6 - 20 mg/dL   Creatinine, Ser 1.46 (H) 0.44 - 1.00 mg/dL   Calcium 6.9 (L) 8.9 - 10.3 mg/dL   Phosphorus 1.5 (L) 2.5 - 4.6 mg/dL   Albumin 2.2 (L) 3.5 - 5.0 g/dL   GFR calc non Af Amer 39 (L) >60 mL/min   GFR calc Af Amer 46 (L) >60 mL/min    Comment: (NOTE) The eGFR has been calculated using the CKD EPI equation. This calculation has not been validated in all clinical situations. eGFR's persistently <60 mL/min signify possible Chronic Kidney Disease.    Anion gap 8 5 - 15  Magnesium     Status: Abnormal   Collection Time: 11/10/17  4:51 AM  Result Value Ref Range   Magnesium 2.6 (H) 1.7 - 2.4 mg/dL  APTT     Status: Abnormal   Collection Time: 11/10/17  4:51 AM  Result Value Ref Range   aPTT 67 (H) 24 - 36 seconds    Comment:        IF BASELINE aPTT IS  ELEVATED, SUGGEST PATIENT RISK ASSESSMENT BE USED TO DETERMINE APPROPRIATE ANTICOAGULANT THERAPY.   CBC with Differential/Platelet     Status: Abnormal   Collection Time: 11/10/17  4:51 AM  Result Value Ref Range   WBC 23.6 (H) 4.0 - 10.5 K/uL   RBC 3.49 (L) 3.87 - 5.11 MIL/uL   Hemoglobin 10.4 (L) 12.0 - 15.0 g/dL   HCT 31.6 (L) 36.0 - 46.0 %   MCV 90.5 78.0 - 100.0 fL   MCH 29.8 26.0 - 34.0 pg   MCHC 32.9 30.0 - 36.0 g/dL   RDW 16.3 (H) 11.5 - 15.5 %   Platelets 280 150 - 400 K/uL   Neutrophils Relative % 90 %   Lymphocytes Relative 4 %   Monocytes Relative 4 %   Eosinophils Relative 0 %   Basophils Relative 2 %   Neutro Abs 21.3 (H) 1.7 - 7.7 K/uL   Lymphs Abs 0.9 0.7 - 4.0 K/uL   Monocytes Absolute 0.9 0.1 - 1.0 K/uL   Eosinophils Absolute 0.0 0.0 - 0.7 K/uL   Basophils Absolute 0.5 (H) 0.0 - 0.1 K/uL   RBC Morphology POLYCHROMASIA PRESENT    WBC Morphology TOXIC GRANULATION     Comment: MILD LEFT SHIFT (1-5% METAS, OCC MYELO, OCC BANDS)  POCT Activated clotting time     Status: None   Collection Time: 11/10/17  5:00 AM  Result Value Ref Range   Activated Clotting Time 186 seconds  POCT Activated clotting time     Status: None   Collection Time: 11/10/17  6:08 AM  Result Value Ref Range   Activated Clotting Time 202 seconds  Glucose, capillary     Status: Abnormal   Collection Time: 11/10/17  7:18 AM  Result Value Ref Range   Glucose-Capillary 130 (H) 65 - 99 mg/dL   Dg Chest Port 1 View  Result Date: 11/09/2017 CLINICAL DATA:  Hypoxia EXAM: PORTABLE CHEST 1 VIEW COMPARISON:  November 08, 2017 FINDINGS: Endotracheal tube tip is 3.2 cm above the carina. Nasogastric tube tip and side port are below the diaphragm. Central catheter tip is in the superior vena cava. No pneumothorax. There is persistent atelectatic  change in the left base with small left pleural effusion. There is mild right base atelectasis. Heart is upper normal in size with pulmonary vascularity within  normal limits. No adenopathy. No bone lesions. IMPRESSION: Tube and catheter positions as described without pneumothorax. Persistent bibasilar atelectasis, more on the left than on the right, stable. No new opacity. Small left pleural effusion. Stable cardiac silhouette. Electronically Signed   By: Lowella Grip III M.D.   On: 11/09/2017 07:16      Assessment/Plan ARF - on CRRT, Cr 1.4  Gallstone pancreatitis - came in with lipase >10,000, last lipase 850 on 1/16 - repeat lipase - MRCP on 1/15 with concern for pancreatic necrosis  - LFTs 1/19: Alk phos 73, AST 58, ALT 75, Tbili 1.0 - repeat LFTs - no signs of peritonitis or indications for acute surgical intervention at this time. Surgery typically not indicated in the acute phase with pancreatic necrosis - may require further imaging such as CT or MRCP - would defer to primary service given ARF and need for CRRT  Will discuss with MD and further recommendations to follow.   Brigid Re, [facility] 11/10/2017, 10:54 AM Pager: [phone] Consults: [phone] Mon-Fri 7:00 am-4:30 pm Sat-Sun 7:00 am-11:30 am

## 2017-11-10 NOTE — Progress Notes (Signed)
Pharmacy Antibiotic Note  Emma Washington is a 57 y.o. female admitted on 02/06/2018 with acute pancreatitis, pancreatic edema and peripancreatic fluid,  possible pancreatic necrosis.  Pharmacy consulted for Zosyn dosing.  Today, 11/10/2017: Day #8 Zosyn.  CRRT since 1/18.  WBC increasing.  Plan: Continue Zosyn 3.375g IV q6h 30 minute infusion time while on CRRT Follow up renal fxn, culture results, and clinical course.   Height: 5\' 9"  (175.3 cm) Weight: (!) 301 lb 5.9 oz (136.7 kg) IBW/kg (Calculated) : 66.2  Temp (24hrs), Avg:98.6 F (37 C), Min:97.7 F (36.5 C), Max:99.6 F (37.6 C)  Recent Labs  Lab 11/07/17 0334 11/07/17 0933  11/08/17 0601 11/08/17 1600 11/09/17 0414 11/09/17 1600 11/10/17 0451  WBC 11.7* 10.4  --  11.6*  --  16.5*  --  23.6*  CREATININE 4.59* 5.16*   < > 3.75* 2.93* 2.68* 1.61* 1.46*   < > = values in this interval not displayed.    Estimated Creatinine Clearance: 64.9 mL/min (A) (by C-G formula based on SCr of 1.46 mg/dL (H)).    Allergies  Allergen Reactions  . Demerol Other (See Comments)    Severe hypotension  . Meperidine     Other reaction(s): Hypotension, Hypotension (ALLERGY/intolerance)  . Other Anaphylaxis    Pineapple  . Pineapple Swelling and Anaphylaxis    Antimicrobials this admission: 1/14 Zosyn >>   Dose adjustments this admission: 1/18 adjust zosyn to 3.375g q6h for 30 min infusions with start of CVVHDF  Microbiology results: 1/14 MRSA PCR: negative  Thank you for allowing pharmacy to be a part of this patient's care.  Clance BollAmanda Marcial Pless, PharmD, BCPS Pager: [phone] 11/10/2017 8:50 AM

## 2017-11-10 NOTE — Progress Notes (Signed)
eLink Physician-Brief Progress Note Patient Name: Emma MantisJoyce Hunkele DOB: 09/20/1961 MRN: [id number]   Date of Service  11/10/2017  HPI/Events of Note  Request to review Abd film for enteric tube placement. Enteric tube tip in mid stomach. Enteral nutrition on hold pending confirmation of enteric tube placement.   eICU Interventions  OK to resume enteral nutrition as ordered.      Intervention Category Intermediate Interventions: Diagnostic test evaluation  Sommer,Steven Dennard Nipugene 11/10/2017, 9:11 PM

## 2017-11-10 NOTE — Progress Notes (Signed)
[facility] Progress Note  Chief Complaint:    Pancreatitis   Subjective: Severe diffuse abdominal pain, requiring around the clock pain meds and still uncomfortable.   Objective:  Vital signs in last 24 hours: Temp:  [97.7 F (36.5 C)-99.6 F (37.6 C)] 99.6 F (37.6 C) (01/21 0725) Pulse Rate:  [93-109] 100 (01/21 0800) Resp:  [14-30] 23 (01/21 0800) BP: (88-144)/(18-87) 126/51 (01/21 0800) SpO2:  [88 %-100 %] 100 % (01/21 0800) Weight:  [301 lb 5.9 oz (136.7 kg)] 301 lb 5.9 oz (136.7 kg) (01/21 0300) Last BM Date: 11/10/17 General:   Alert, obese white female, moaning in pain EENT:  Normal hearing, non icteric sclera, conjunctive pink.  Heart:  Tachy at 107, regular rhythm, no lower extremity edema Pulm: Normal respiratory effort Abdomen:  Distended, unable to hear bowel sounds. Significant diffuse tenderness but no peritoneal signs. .    Neurologic:  Alert and  oriented x4;  grossly normal neurologically. Psych:  Pleasant, cooperative.  Normal mood and affect.   Intake/Output from previous day: 01/20 0701 - 01/21 0700 In: 450 [I.V.:250; IV Piggyback:200] Out: 3139 [Urine:215] Intake/Output this shift: Total I/O In: 45 [I.V.:45] Out: 286 [Urine:62; Other:224]  Lab Results: Recent Labs    11/08/17 0601 11/09/17 0414 11/10/17 0451  WBC 11.6* 16.5* 23.6*  HGB 10.5* 10.1* 10.4*  HCT 32.4* 31.2* 31.6*  PLT 265 259 280   BMET Recent Labs    11/09/17 0414 11/09/17 1600 11/10/17 0451  NA 138 136 139  K 4.2 3.9 3.9  CL 104 102 105  CO2 '25 26 26  ' GLUCOSE 161* 144* 150*  BUN 46* 34* 32*  CREATININE 2.68* 1.61* 1.46*  CALCIUM 6.2* 6.7* 6.9*   LFT Recent Labs    11/08/17 0601  11/10/17 0451  PROT 5.6*  --   --   ALBUMIN 2.3*   < > 2.2*  AST 58*  --   --   ALT 75*  --   --   ALKPHOS 73  --   --   BILITOT 1.0  --   --    < > = values in this interval not displayed.     ASSESSMENT / PLAN:   73. 57 yo female with severe gallstone  necrotizing pancreatitis. Cholelithiasis / choledocholithiasis/ CBD stone with borderline distention of CBD on MRCP 11/04/17 but wonder if passed stones as liver labs trended down. Alk phos normal and tbili has normalized. Transaminases trending down. Passed stone?   On Zosyn. Having significant abdominal pain today. WBC up to 23.6 from 16 yesterday.  Tmax 99. Infected necrosis? Last abdominal imaging was 6 days ago, may need to re-image and consider surgical evaluation.   2. AKI,  ATN and low perfusion per Renal. Gettig CRRT    Principal Problem:   Gallstone pancreatitis Active Problems:   Acute respiratory failure with hypoxia (HCC)   Hypocalcemia   Pancreatic necrosis   AKI (acute kidney injury) ([place])    LOS: 7 days   Tye Savoy ,NP 11/10/2017, 9:21 AM  Pager number [phone]     Attending physician's note   I have taken an interval history, reviewed the chart and examined the patient. I agree with the Advanced Practitioner's note, impression and recommendations.  Severe gallstone pancreatitis with findings of pancreatic necrosis on MRI last week and with rising WBC infected necreosis is a concern. Would like to proceed with a contrasted CT however with recent AKI I'm concerned about worsening AKI with IV contrast.  Cholelithiasis and choledocholithiasis with LFTs improved since admission indicating the CBD stone is not obstructing (or it has passed) therefore no plans for ERCP at this time.  Continue Zosyn and consult surgery regarding mgmt of pancreatic necrosis, cholelithiasis.   Lucio Edward, MD [facility] [phone] Mon-Fri 8a-5p [phone] after 5p, weekends, holidays

## 2017-11-10 NOTE — Care Management Note (Signed)
Case Management Note  Patient Details  Name: Jannet MantisJoyce Berne MRN: [id number] Date of Birth: Feb 18, 1961  Subjective/Objective:                  Acute renal failure, on CRRT  Action/Plan: Date:  November 10, 2017 Chart reviewed for concurrent status and case management needs.  Will continue to follow patient progress.  Discharge Planning: following for needs.  None present at this time of review. Expected discharge date: November 13, 2017 Marcelle SmilingRhonda Clyda Smyth, BSN, [place]3, [place]   [phone]   Expected Discharge Date:  (unknown)               Expected Discharge Plan:  Home/Self Care  In-House Referral:     Discharge planning Services  CM Consult  Post Acute Care Choice:    Choice offered to:     DME Arranged:    DME Agency:     HH Arranged:    HH Agency:     Status of Service:  In process, will continue to follow  If discussed at Long Length of Stay Meetings, dates discussed:    Additional Comments:  Golda AcreDavis, Naszir Cott Lynn, RN 11/10/2017, 9:45 AM

## 2017-11-10 NOTE — Progress Notes (Signed)
   11/10/17 1600  Clinical Encounter Type  Visited With Patient;Health care provider  Visit Type Initial  Referral From Nurse  Consult/Referral To Chaplain  Spiritual Encounters  Spiritual Needs Emotional;Prayer   While rounding on other patients was made aware of this patient.  Nurse felt like a visit would be good to redirect her thinking about her pain.  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  Offered prayer for her and she appreciated the visit. Chaplain Agustin CreeNewton Sumaiya Arruda

## 2017-11-10 NOTE — Progress Notes (Signed)
Subjective: Interval History: Extub , confused, < 300 cc uop yest  Objective: Vital signs in last 24 hours: Temp:  [97.7 F (36.5 C)-99.6 F (37.6 C)] 97.9 F (36.6 C) (01/21 1130) Pulse Rate:  [93-110] 100 (01/21 1500) Resp:  [22-36] 26 (01/21 1500) BP: (88-144)/(43-77) 110/59 (01/21 1500) SpO2:  [88 %-100 %] 97 % (01/21 1500) Weight:  [136.7 kg (301 lb 5.9 oz)] 136.7 kg (301 lb 5.9 oz) (01/21 0300) Weight change: -4.5 kg (-14.7 oz)  Intake/Output from previous day: 01/20 0701 - 01/21 0700 In: 450 [I.V.:250; IV Piggyback:200] Out: 3139 [Urine:215] Intake/Output this shift: Total I/O In: 469.7 [I.V.:105; NG/GT:44.7; IV Piggyback:320] Out: 991 [Urine:153; Other:838]  General appearance: cooperative, morbidly obese and slowed mentation Neck: thyroid not enlarged, symmetric, no tenderness/mass/nodules Resp: diminished breath sounds bilaterally and rales bibasilar Cardio: S1, S2 normal GI: distended does have some bs,  Extremities: edema 2+  Lab Results: Recent Labs    11/09/17 0414 11/10/17 0451  WBC 16.5* 23.6*  HGB 10.1* 10.4*  HCT 31.2* 31.6*  PLT 259 280   BMET:  Recent Labs    11/09/17 1600 11/10/17 0451  NA 136 139  K 3.9 3.9  CL 102 105  CO2 26 26  GLUCOSE 144* 150*  BUN 34* 32*  CREATININE 1.61* 1.46*  CALCIUM 6.7* 6.9*   No results for input(s): PTH in the last 72 hours. Iron Studies: No results for input(s): IRON, TIBC, TRANSFERRIN, FERRITIN in the last 72 hours.  Studies/Results: Dg Chest Port 1 View  Result Date: 11/09/2017 CLINICAL DATA:  Hypoxia EXAM: PORTABLE CHEST 1 VIEW COMPARISON:  November 08, 2017 FINDINGS: Endotracheal tube tip is 3.2 cm above the carina. Nasogastric tube tip and side port are below the diaphragm. Central catheter tip is in the superior vena cava. No pneumothorax. There is persistent atelectatic change in the left base with small left pleural effusion. There is mild right base atelectasis. Heart is upper normal in size  with pulmonary vascularity within normal limits. No adenopathy. No bone lesions. IMPRESSION: Tube and catheter positions as described without pneumothorax. Persistent bibasilar atelectasis, more on the left than on the right, stable. No new opacity. Small left pleural effusion. Stable cardiac silhouette. Electronically Signed   By: Bretta BangWilliam  Woodruff III M.D.   On: 11/09/2017 07:16   Dg Abd Portable 1v  Result Date: 11/10/2017 CLINICAL DATA:  Status post feeding tube placement. EXAM: PORTABLE ABDOMEN - 1 VIEW COMPARISON:  Abdominal radiograph of 07 November 2017 FINDINGS: The nasogastric tube has been removed and replaced with a radiodense tipped feeding tube. Thetube tip is in the mid gastric body. IMPRESSION: The tip of the feeding tube is in the mid gastric body. Electronically Signed   By: David  SwazilandJordan M.D.   On: 11/10/2017 11:28    I have reviewed the patient's current medications.  Assessment/Plan: 1 AKI good solute clearance/acid/base/K with CRRT. Is making minimal urine 2 Resp failure better. Will cont neg fluid balance to assist 3 Obesity. 4 Gallstone Necrotizing pancreatitis  a little bowel activity 5 hypocalcemia some better.  P Cont CRRT, lower vol   LOS: 7 days   Maree Krabbeobert D Zae Kirtz 11/10/2017,3:40 PM

## 2017-11-10 NOTE — Progress Notes (Signed)
PULMONARY / CRITICAL CARE MEDICINE   Name: Emma [place] MRN: [id number] DOB: 1961/10/21    ADMISSION DATE:  11/17/2017 CONSULTATION DATE:  11/04/2017  REFERRING MD:  Dr. 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 Other associated symptoms include nausea, vomiting and decreased PO intake (since 1/13). Pertinent PMHx significant for nephrolithiasis.   Initial labs show lipase >10,000, mildly elevated WBC of 11.5, creatinine 1.33, AST of 497 and ALT of 601. UA positive for ketones, protein, mucus and rare bacteria. UDS positive for opiates. CT renal stone study showed evidence of acute pancreatitis with pancreatic edema and peripancreatic fluid anteriorly to the body and tail of the pancreas as well as a 1 mm non obstructing stone in the upper pole of the left kidney.  Patient was admitted and GI consulted. GI suggested MRCP to be done today which shows cholelithiasis, possible choledocolithiasis, possible pancreatic necrosis of the body and tail, edema/inflammation in the retroperitoneal space and hepatomegaly. CXR today indicate low lung volumes and possible bilateral atelectasis vs PNA.  Patient was tachypnic with concern for decompensation which prompted a critical care consult. She is currently on protonix, zosyn, morphine, fluids, phenergan, reglan and zofran.     SUBJECTIVE:  Remains on CVVHD > RN pulling 50-150/hr.  Off pressors.  Pt reports ongoing pain.  Loose stool this am.  Afebrile.     VITAL SIGNS: BP 111/77   Pulse 95   Temp 99.6 F (37.6 C) (Axillary)   Resp (!) 27   Ht 5\' 9"  (1.753 m)   Wt (!) 301 lb 5.9 oz (136.7 kg)   LMP 11/07/2011   SpO2 93%   BMI 44.50 kg/m   HEMODYNAMICS:    VENTILATOR SETTINGS:    INTAKE / OUTPUT: I/O last 3 completed shifts: In: 1237.5 [I.V.:637.5; Other:80; NG/GT:220; IV Piggyback:300] Out: 5424  [Urine:448; Other:4976]  PHYSICAL EXAMINATION: General: obese female lying in bed, appears ill  HEENT: MM pink/moist, no jvd Neuro: Awakens to voice, MAE, generalized weakness  CV: s1s2 rrr, no m/r/g PULM: even/non-labored, lungs bilaterally diminished  GI: protuberant, tender to palpation, decreased bowel sounds Extremities: warm/dry, trace generalized edema  Skin: no rashes or lesions  PULMONARY Recent Labs  Lab 11/07/17 0432 11/07/17 0650  PHART 7.089* 7.191*  PCO2ART 65.4* 45.7  PO2ART 88.1 149*  HCO3 19.0* 16.9*  O2SAT 93.4 98.5    CBC Recent Labs  Lab 11/08/17 0601 11/09/17 0414 11/10/17 0451  HGB 10.5* 10.1* 10.4*  HCT 32.4* 31.2* 31.6*  WBC 11.6* 16.5* 23.6*  PLT 265 259 280    COAGULATION No results for input(s): INR in the last 168 hours.  CARDIAC  No results for input(s): TROPONINI in the last 168 hours. No results for input(s): PROBNP in the last 168 hours.   CHEMISTRY Recent Labs  Lab 11/06/17 0248 11/07/17 0334  11/08/17 0601 11/08/17 1600 11/09/17 0414 11/09/17 1600 11/10/17 0451  NA 138 139   < > 137 138 138 136 139  K 4.0 4.4   < > 3.9 4.0 4.2 3.9 3.9  CL 109 110   < > 105 106 104 102 105  CO2 19* 18*   < > 23 24 25 26 26   GLUCOSE 182* 160*   < > 161* 145* 161* 144* 150*  BUN 61* 67*   < > 62* 52* 46* 34* 32*  CREATININE 4.51* 4.59*   < >  3.75* 2.93* 2.68* 1.61* 1.46*  CALCIUM 4.2* <4.0*   < > 4.9* 6.4* 6.2* 6.7* 6.9*  MG 1.7 2.2  --  2.5*  --  2.5*  --  2.6*  PHOS 1.9* 4.7*   < > 3.6 3.3 3.2 1.5* 1.5*   < > = values in this interval not displayed.   Estimated Creatinine Clearance: 64.9 mL/min (A) (by C-G formula based on SCr of 1.46 mg/dL (H)).   LIVER Recent Labs  Lab 11/04/17 1144 11/05/17 0242 11/06/17 0248 11/07/17 0334  11/08/17 0601 11/08/17 1600 11/09/17 0414 11/09/17 1600 11/10/17 0451  AST 164* 92* 50* 49*  --  58*  --   --   --   --   ALT 390* 270* 151* 106*  --  75*  --   --   --   --   ALKPHOS 98 86 75 73   --  73  --   --   --   --   BILITOT 6.4* 5.3* 1.9* 0.9  --  1.0  --   --   --   --   PROT 5.5* 5.4* 5.5* 5.8*  --  5.6*  --   --   --   --   ALBUMIN 3.0* 2.8* 2.8* 2.6*   < > 2.3* 2.1* 2.2* 2.3* 2.2*   < > = values in this interval not displayed.     INFECTIOUS No results for input(s): LATICACIDVEN, PROCALCITON in the last 168 hours.   ENDOCRINE CBG (last 3)  Recent Labs    11/09/17 2304 11/10/17 0314 11/10/17 0718  GLUCAP 128* 146* 130*     IMAGING x48h  - image(s) personally visualized  -   highlighted in bold Dg Chest Port 1 View  Result Date: 11/09/2017 CLINICAL DATA:  Hypoxia EXAM: PORTABLE CHEST 1 VIEW COMPARISON:  November 08, 2017 FINDINGS: Endotracheal tube tip is 3.2 cm above the carina. Nasogastric tube tip and side port are below the diaphragm. Central catheter tip is in the superior vena cava. No pneumothorax. There is persistent atelectatic change in the left base with small left pleural effusion. There is mild right base atelectasis. Heart is upper normal in size with pulmonary vascularity within normal limits. No adenopathy. No bone lesions. IMPRESSION: Tube and catheter positions as described without pneumothorax. Persistent bibasilar atelectasis, more on the left than on the right, stable. No new opacity. Small left pleural effusion. Stable cardiac silhouette. Electronically Signed   By: Bretta BangWilliam  Woodruff III M.D.   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  MR abdomen/MRCP 1/15 >> cholelithiasis, possible  choledocolithiasis, possible pancreatic necrosis of the body and tail, edema/inflammation in the retroperitoneal space and hepatomegaly CXR 1/15 >> Atelectasis vs PNA L>R; low lung volumes, mild cardiopericadrial silhouette enlargement. US Renal 1/17 >> normal renal US  CULTURES: None  ANTIBIOTICS: Zosyn 1/15 >>   LINES ETT 1/18 >> 1/20  SIGNIFICANT EVENTS: 1/14 Admit with abd pain 1/15 Feels better, some nausea/vomiting, progressive renal dysfunction, 9L resuscitation 1/18 Decompensated 0300, intubated, ETT, rising sr cr 1/19 Intubated. On vent. CRRT; urine  25cc/h. On fent 200 -> folloiwing commands. No pressors.  1/20 SBT. CRRT / negative balance.  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  Decompensated with progressive AKI (NSAIDS, galladium, shock) & respiratory distress requiring intubation.   ASSESSMENT / PLAN:  PULMONARY A: At Risk Respiratory Failure - in setting of volume resuscitation with pancreatitis, pain with splinting. High risk ALI Diffuse Bilateral Infiltrates - suspect edema, +/- ALI P:   Pulmonary hygiene - IS, mobilize as able Follow intermittent CXR   CARDIOVASCULAR A:  Tachycardia   SIRS  P:  Tele monitoring   RENAL A:   AKI / Oliguric - in setting of volume depletion (N/V, poor intake), pancreatitis. Consistent w ATN, hopeful to recover.  P:   Trend BMP / urinary output Replace electrolytes as indicated Avoid nephrotoxic agents, ensure adequate renal perfusion CVVHD per Nephrology, appreciate input   GASTROINTESTINAL A:   Acute Moderately Severe Pancreatitis - concern for possible necrosis on MRCP  Gallstone Pancreatitis Abdominal Pain  Nausea / vomiting P:   PRN phenergan  NPO  Place small bore NGT for trickle feeding  Obtain KUB post placement > also assess for ileus with decreased bowel sounds Will need ERCP / cholecystectomy  PPI  Appreciate GI /  CCS  HEMATOLOGIC A:   Hx Anemia  P:  Trend CBC Heparin for DVT prophylaxis    INFECTIOUS A:   Gallstone Pancreatitis - triglycerides 213 P: Empiric zosyn, D7/x > consider discontinue, will discuss with MD Monitor fever curve / WBC trend   ENDOCRINE A:   Hyperglycemia - in setting of pancreatitis   P:   SSI Q4 Monitor for hypo / hyperglycemia   NEUROLOGIC A:   Pain - secondary to gallstone pancreatitis  P:   Fentanyl  25-50 mcg Q2 PRN pain  PT efforts / mobilize     FAMILY  - Updates - No family at bedside am 1/21.  Will update on arrival.   [name], NP-C [facility] Pgr: [phone] or if no answer [phone] 11/10/2017, 8:48 AM

## 2017-11-10 NOTE — Progress Notes (Signed)
eLink Physician-Brief Progress Note Patient Name: Jannet MantisJoyce Bouie DOB: 01-10-1961 MRN: [id number]   Date of Service  11/10/2017  HPI/Events of Note  Agitated Delirium - QTc interval = 0.41 seconds.   eICU Interventions  Will order: 1. Haldol 1 mg IV Q 4 hours PRN agitatied delirium. 2. Monitor QTc interval Q 6 hours. Notify MD for QTc interval > 0.5 seconds.      Intervention Category Major Interventions: Delirium, psychosis, severe agitation - evaluation and management  Sommer,Steven Eugene 11/10/2017, 10:33 PM

## 2017-11-10 NOTE — Progress Notes (Signed)
Pt pulled out NG tube. Pt stable at this time. Will attempt to re-insert. Will continue to monitor.

## 2017-11-11 ENCOUNTER — Inpatient Hospital Stay (HOSPITAL_COMMUNITY): Payer: BLUE CROSS/BLUE SHIELD

## 2017-11-11 DIAGNOSIS — J9600 Acute respiratory failure, unspecified whether with hypoxia or hypercapnia: Secondary | ICD-10-CM

## 2017-11-11 DIAGNOSIS — D72829 Elevated white blood cell count, unspecified: Secondary | ICD-10-CM

## 2017-11-11 LAB — RENAL FUNCTION PANEL
ANION GAP: 13 (ref 5–15)
Albumin: 2.1 g/dL — ABNORMAL LOW (ref 3.5–5.0)
Albumin: 2 g/dL — ABNORMAL LOW (ref 3.5–5.0)
Anion gap: 9 (ref 5–15)
BUN: 33 mg/dL — AB (ref 6–20)
BUN: 32 mg/dL — ABNORMAL HIGH (ref 6–20)
CHLORIDE: 104 mmol/L (ref 101–111)
CO2: 25 mmol/L (ref 22–32)
CO2: 22 mmol/L (ref 22–32)
CREATININE: 1.72 mg/dL — AB (ref 0.44–1.00)
Calcium: 6.5 mg/dL — ABNORMAL LOW (ref 8.9–10.3)
Calcium: 6.6 mg/dL — ABNORMAL LOW (ref 8.9–10.3)
Chloride: 103 mmol/L (ref 101–111)
Creatinine, Ser: 1.57 mg/dL — ABNORMAL HIGH (ref 0.44–1.00)
GFR calc Af Amer: 37 mL/min — ABNORMAL LOW (ref >60)
GFR calc Af Amer: 42 mL/min — ABNORMAL LOW (ref >60)
GFR calc non Af Amer: 36 mL/min — ABNORMAL LOW (ref >60)
GFR, EST NON AFRICAN AMERICAN: 32 mL/min — AB (ref >60)
GLUCOSE: 148 mg/dL — AB (ref 65–99)
Glucose, Bld: 167 mg/dL — ABNORMAL HIGH (ref 65–99)
Phosphorus: 2.6 mg/dL (ref 2.5–4.6)
Phosphorus: 2.4 mg/dL — ABNORMAL LOW (ref 2.5–4.6)
Potassium: 3.9 mmol/L (ref 3.5–5.1)
Potassium: 4 mmol/L (ref 3.5–5.1)
SODIUM: 138 mmol/L (ref 135–145)
Sodium: 138 mmol/L (ref 135–145)

## 2017-11-11 LAB — POCT ACTIVATED CLOTTING TIME
ACTIVATED CLOTTING TIME: 197 s
ACTIVATED CLOTTING TIME: 186 s
ACTIVATED CLOTTING TIME: 186 s
ACTIVATED CLOTTING TIME: 191 s
ACTIVATED CLOTTING TIME: 186 s
ACTIVATED CLOTTING TIME: 175 s
ACTIVATED CLOTTING TIME: 180 s
ACTIVATED CLOTTING TIME: 175 s
Activated Clotting Time: 142 seconds
Activated Clotting Time: 164 seconds
Activated Clotting Time: 169 seconds
Activated Clotting Time: 175 seconds
Activated Clotting Time: 180 seconds
Activated Clotting Time: 180 seconds
Activated Clotting Time: 180 seconds
Activated Clotting Time: 186 seconds
Activated Clotting Time: 186 seconds
Activated Clotting Time: 186 seconds
Activated Clotting Time: 186 seconds
Activated Clotting Time: 186 seconds
Activated Clotting Time: 202 seconds
Activated Clotting Time: 213 seconds
Activated Clotting Time: 158 seconds
Activated Clotting Time: 164 seconds
Activated Clotting Time: 175 seconds

## 2017-11-11 LAB — PHOSPHORUS: Phosphorus: 2.6 mg/dL (ref 2.5–4.6)

## 2017-11-11 LAB — GLUCOSE, CAPILLARY
GLUCOSE-CAPILLARY: 137 mg/dL — AB (ref 65–99)
GLUCOSE-CAPILLARY: 152 mg/dL — AB (ref 65–99)
GLUCOSE-CAPILLARY: 138 mg/dL — AB (ref 65–99)
GLUCOSE-CAPILLARY: 134 mg/dL — AB (ref 65–99)
Glucose-Capillary: 142 mg/dL — ABNORMAL HIGH (ref 65–99)

## 2017-11-11 LAB — CBC
HEMATOCRIT: 29.5 % — AB (ref 36.0–46.0)
HEMOGLOBIN: 9.7 g/dL — AB (ref 12.0–15.0)
MCH: 30.1 pg (ref 26.0–34.0)
MCHC: 32.9 g/dL (ref 30.0–36.0)
MCV: 91.6 fL (ref 78.0–100.0)
Platelets: 320 10*3/uL (ref 150–400)
RBC: 3.22 MIL/uL — ABNORMAL LOW (ref 3.87–5.11)
RDW: 16.5 % — ABNORMAL HIGH (ref 11.5–15.5)
WBC: 27.1 10*3/uL — ABNORMAL HIGH (ref 4.0–10.5)

## 2017-11-11 LAB — APTT: aPTT: 44 seconds — ABNORMAL HIGH (ref 24–36)

## 2017-11-11 LAB — MAGNESIUM: Magnesium: 2.7 mg/dL — ABNORMAL HIGH (ref 1.7–2.4)

## 2017-11-11 MED ORDER — FENTANYL 50 MCG/HR TD PT72
50.0000 ug | MEDICATED_PATCH | TRANSDERMAL | Status: DC
  Administered 2017-11-11 – 2017-11-17 (×3): 50 ug via TRANSDERMAL
  Filled 2017-11-11 (×3): qty 1

## 2017-11-11 MED ORDER — FENTANYL 25 MCG/HR TD PT72: 25.0000 ug | MEDICATED_PATCH | TRANSDERMAL | Status: DC

## 2017-11-11 MED ORDER — HYDROCODONE-ACETAMINOPHEN 7.5-325 MG/15ML PO SOLN
10.0000 mL | Freq: Four times a day (QID) | ORAL | Status: DC | PRN
  Administered 2017-11-11: 10 mL
  Filled 2017-11-11: qty 15

## 2017-11-11 MED ORDER — IOPAMIDOL (ISOVUE-300) INJECTION 61%: 15.0000 mL | Freq: Once | INTRAVENOUS | Status: AC | PRN

## 2017-11-11 MED ORDER — FENTANYL CITRATE (PF) 100 MCG/2ML IJ SOLN
25.0000 ug | INTRAMUSCULAR | Status: DC | PRN
  Administered 2017-11-11 – 2017-11-19 (×45): 25 ug via INTRAVENOUS
  Filled 2017-11-11 (×47): qty 2

## 2017-11-11 MED ORDER — SODIUM CHLORIDE 0.9 % IV SOLN
1.0000 g | Freq: Two times a day (BID) | INTRAVENOUS | Status: DC
  Administered 2017-11-11 – 2017-11-12 (×3): 1 g via INTRAVENOUS
  Filled 2017-11-11 (×3): qty 1

## 2017-11-11 NOTE — Progress Notes (Signed)
[facility] Progress Note     Subjective: CC: abdominal pain Patient with continued abdominal pain. Resting briefly this AM. On CRRT. Had 3 loose BM in 24h. Tolerating TF.  Objective: Vital signs in last 24 hours: Temp:  [97.4 F (36.3 C)-99.8 F (37.7 C)] 99.8 F (37.7 C) (01/22 0317) Pulse Rate:  [93-116] 104 (01/22 0700) Resp:  [22-38] 31 (01/22 0700) BP: (93-151)/(41-84) 129/60 (01/22 0700) SpO2:  [92 %-100 %] 98 % (01/22 0700) Last BM Date: 11/10/17  Intake/Output from previous day: 01/21 0701 - 01/22 0700 In: 1263.3 [I.V.:255; NG/GT:188.3; IV Piggyback:470] Out: 3140 [Urine:589] Intake/Output this shift: No intake/output data recorded.  PE: Gen:  Resting, appears in pain  Card:  Regular rate and rhythm, pedal pulses 2+ BL Pulm:  tachypneic, clear to auscultation bilaterally Abd: Soft, diffusely tender, non-distended, bowel sounds hypoactve, no HSM Skin: warm and dry, no rashes   Lab Results:  Recent Labs    11/10/17 0451 11/11/17 0540  WBC 23.6* 27.1*  HGB 10.4* 9.7*  HCT 31.6* 29.5*  PLT 280 320   BMET Recent Labs    11/10/17 1653 11/11/17 0540  NA 139 138  K 3.9 3.9  CL 105 104  CO2 25 25  GLUCOSE 158* 167*  BUN 33* 33*  CREATININE 1.67* 1.72*  CALCIUM 6.7* 6.5*   PT/INR No results for input(s): LABPROT, INR in the last 72 hours. CMP     Component Value Date/Time   NA 138 11/11/2017 0540   K 3.9 11/11/2017 0540   CL 104 11/11/2017 0540   CO2 25 11/11/2017 0540   GLUCOSE 167 (H) 11/11/2017 0540   BUN 33 (H) 11/11/2017 0540   CREATININE 1.72 (H) 11/11/2017 0540   CALCIUM 6.5 (L) 11/11/2017 0540   PROT 5.8 (L) 11/10/2017 1051   ALBUMIN 2.1 (L) 11/11/2017 0540   AST 55 (H) 11/10/2017 1051   ALT 59 (H) 11/10/2017 1051   ALKPHOS 100 11/10/2017 1051   BILITOT 1.2 11/10/2017 1051   GFRNONAA 32 (L) 11/11/2017 0540   GFRAA 37 (L) 11/11/2017 0540   Lipase     Component Value Date/Time   LIPASE 22 11/10/2017 1051        Studies/Results: Dg Abd 1 View  Result Date: 11/10/2017 CLINICAL DATA:  NG tube placement Best obtainable notes due to patients size, pain and altered mental status. I could not get her to stop rocking and talking EXAM: ABDOMEN - 1 VIEW COMPARISON:  Earlier film of the same day FINDINGS: Weighted enteric tube tip is in the gastric body as before. Normal bowel gas pattern. The lower abdomen is is excluded. IMPRESSION: 1. Stable placement of feeding tube tip in the gastric body. Electronically Signed   By: Corlis Leak  Hassell M.D.   On: 11/10/2017 19:29   Dg Chest Port 1 View  Result Date: 11/11/2017 CLINICAL DATA:  Pancreatitis.  Shortness of breath. EXAM: PORTABLE CHEST 1 VIEW COMPARISON:  11/09/2017. FINDINGS: Interim extubation. Interim placement of feeding scratched it interim removal of NG tube placement of feeding tube. Right IJ line in stable position. Cardiomegaly. Low lung volumes. Left lower lobe infiltrate suggesting pneumonia. Asymmetric pulmonary edema could also present this fashion. Small left pleural effusion. No pneumothorax. IMPRESSION: 1. Interim extubation and removal of NG tube. Interim placement of feeding tube, its tip is below left hemidiaphragm. Right IJ line stable position. 2. Low lung volumes with basilar atelectasis. Left lower lobe infiltrate/edema and small left pleural effusion. 3.  Stable cardiomegaly. Electronically Signed  ByMaisie Fus  Register   On: 11/11/2017 06:57   Dg Abd Portable 1v  Result Date: 11/10/2017 CLINICAL DATA:  Status post feeding tube placement. EXAM: PORTABLE ABDOMEN - 1 VIEW COMPARISON:  Abdominal radiograph of 07 November 2017 FINDINGS: The nasogastric tube has been removed and replaced with a radiodense tipped feeding tube. Thetube tip is in the mid gastric body. IMPRESSION: The tip of the feeding tube is in the mid gastric body. Electronically Signed   By: David  [place]and M.D.   On: 11/10/2017 11:28    Anti-infectives: Anti-infectives (From  admission, onward)   Start     Dose/Rate Route Frequency Ordered Stop   11/09/17 1000  piperacillin-tazobactam (ZOSYN) IVPB 3.375 g  Status:  Discontinued     3.375 g 12.5 mL/hr over 240 Minutes Intravenous Every 8 hours 11/09/17 0614 11/09/17 0615   11/09/17 1000  piperacillin-tazobactam (ZOSYN) IVPB 3.375 g     3.375 g 100 mL/hr over 30 Minutes Intravenous Every 6 hours 11/09/17 0616     11/07/17 2200  piperacillin-tazobactam (ZOSYN) IVPB 2.25 g  Status:  Discontinued     2.25 g 100 mL/hr over 30 Minutes Intravenous Every 8 hours 11/07/17 1218 11/07/17 1741   11/07/17 2200  piperacillin-tazobactam (ZOSYN) IVPB 3.375 g  Status:  Discontinued     3.375 g 12.5 mL/hr over 240 Minutes Intravenous Every 6 hours 11/07/17 1741 11/07/17 1747   11/07/17 2200  piperacillin-tazobactam (ZOSYN) IVPB 3.375 g  Status:  Discontinued     3.375 g 100 mL/hr over 30 Minutes Intravenous Every 6 hours 11/07/17 1747 11/09/17 0613   11/04/17 1100  piperacillin-tazobactam (ZOSYN) IVPB 3.375 g  Status:  Discontinued     3.375 g 12.5 mL/hr over 240 Minutes Intravenous Every 8 hours 11/04/17 1059 11/07/17 1218   10/24/2017 1200  piperacillin-tazobactam (ZOSYN) IVPB 3.375 g     3.375 g 100 mL/hr over 30 Minutes Intravenous  Once 11/04/2017 1157 11/18/2017 1309       Assessment/Plan ARF - on CRRT, Cr 1.72 VDRF - extubated, stable Obesity Nephrolithiasis  Gallstone pancreatitis - MRCP on 1/15 with concern for pancreatic necrosis  - repeat LFTs and lipase normalized; WBC up to 27, trending up - no signs of peritonitis or indications for acute surgical intervention at this time. Surgery typically not indicated in the acute phase with pancreatic necrosis - recommend repeat CT possibly later this week to assess pancreas further - will discuss timing of repeat imaging with nephrology - continue nutritional support and antibiotics - discussed with CCM possibly switching abx to merrem  FEN: NPO, IVF; getting enteral  TFs - could consider fentanyl patch and/or hycet via cortrak for improvement in pain control VTE: SCDs ID: IV zosyn 1/14>>    LOS: 8 days    Wells Guiles , [facility] 11/11/2017, 7:43 AM Pager: [phone] Consults: [phone] Mon-Fri 7:00 am-4:30 pm Sat-Sun 7:00 am-11:30 am

## 2017-11-11 NOTE — Progress Notes (Signed)
Pharmacy Antibiotic Note  Jannet MantisJoyce Kliewer is a 57 y.o. female admitted on 11/13/2017 with acute pancreatitis, pancreatic edema and peripancreatic fluid,  possible pancreatic necrosis.  Pharmacy consulted for Zosyn dosing.  Today, 11/11/2017: Day #9 Zosyn.  CRRT since 1/18.  WBC increasing. Consulted to change to Meropenem  Plan: Meropenem 1g IV q12h while on CRRT Follow up renal fxn, culture results, and clinical course.   Height: 5\' 9"  (175.3 cm) Weight: (!) 301 lb 5.9 oz (136.7 kg) IBW/kg (Calculated) : 66.2  Temp (24hrs), Avg:98.7 F (37.1 C), Min:97.4 F (36.3 C), Max:99.8 F (37.7 C)  Recent Labs  Lab 11/07/17 0933  11/08/17 0601  11/09/17 0414 11/09/17 1600 11/10/17 0451 11/10/17 1653 11/11/17 0540  WBC 10.4  --  11.6*  --  16.5*  --  23.6*  --  27.1*  CREATININE 5.16*   < > 3.75*   < > 2.68* 1.61* 1.46* 1.67* 1.72*   < > = values in this interval not displayed.    Estimated Creatinine Clearance: 55.1 mL/min (A) (by C-G formula based on SCr of 1.72 mg/dL (H)).    Allergies  Allergen Reactions  . Demerol Other (See Comments)    Severe hypotension  . Meperidine     Other reaction(s): Hypotension, Hypotension (ALLERGY/intolerance)  . Other Anaphylaxis    Pineapple  . Pineapple Swelling and Anaphylaxis    Antimicrobials this admission: 1/14 Zosyn >> 1/22 1/22 Meropenem >>  Dose adjustments this admission: 1/18 adjust zosyn to 3.375g q6h for 30 min infusions with start of CVVHDF  Microbiology results: 1/14 MRSA PCR: negative 1/15 Surgical PCR: neg/neg 1/22 BCx: sent  Thank you for allowing pharmacy to be a part of this patient's care.  Loralee PacasErin Lorence Nagengast, PharmD, BCPS Pager: [phone] 11/11/2017 9:29 AM

## 2017-11-11 NOTE — Progress Notes (Signed)
Gastroenterology Progress Note  Chief Complaint:    Pancreatitis  Subjective: Nausea / vomiting during the night. Still with severe diffuse abdominal pain. Told her brother that she was now having menstrual cramps now  Objective:  Vital signs in last 24 hours: Temp:  [97.4 F (36.3 C)-99.8 F (37.7 C)] 98.8 F (37.1 C) (01/22 0800) Pulse Rate:  [79-116] 79 (01/22 0800) Resp:  [22-38] 34 (01/22 0800) BP: (93-151)/(31-84) 111/31 (01/22 0800) SpO2:  [92 %-100 %] 100 % (01/22 0800) Last BM Date: 11/10/17 General:   Alert, obese white female lying in bed moaning and having signficant abdominal pain.  EENT:  Normal hearing, non icteric sclera, conjunctive pink.  Heart:  Sinus tachycardia, normal rhythm, no lower extremity edema Pulm: with tachypnea, normal effort Abdomen:  Soft, distended and diffusely tender. Hypoactive bowel sounds Neurologic:  Alert and  oriented x4;  grossly normal neurologically. Psych:  Pleasant, cooperative.  Normal mood and affect.   Intake/Output from previous day: 01/21 0701 - 01/22 0700 In: 1283.3 [I.V.:265; NG/GT:188.3; IV Piggyback:470] Out: 3140 [Urine:589] Intake/Output this shift: Total I/O In: 20 [I.V.:10; Other:10] Out: 122 [Other:122]  Lab Results: Recent Labs    11/09/17 0414 11/10/17 0451 11/11/17 0540  WBC 16.5* 23.6* 27.1*  HGB 10.1* 10.4* 9.7*  HCT 31.2* 31.6* 29.5*  PLT 259 280 320   BMET Recent Labs    11/10/17 0451 11/10/17 1653 11/11/17 0540  NA 139 139 138  K 3.9 3.9 3.9  CL 105 105 104  CO2 26 25 25   GLUCOSE 150* 158* 167*  BUN 32* 33* 33*  CREATININE 1.46* 1.67* 1.72*  CALCIUM 6.9* 6.7* 6.5*   LFT Recent Labs    11/10/17 1051  11/11/17 0540  PROT 5.8*  --   --   ALBUMIN 2.2*   < > 2.1*  AST 55*  --   --   ALT 59*  --   --   ALKPHOS 100  --   --   BILITOT 1.2  --   --   BILIDIR 0.5  --   --   IBILI 0.7  --   --    < > = values in this interval not displayed.   Dg Abd 1 View  Result  Date: 11/10/2017 CLINICAL DATA:  NG tube placement Best obtainable notes due to patients size, pain and altered mental status. I could not get her to stop rocking and talking EXAM: ABDOMEN - 1 VIEW COMPARISON:  Earlier film of the same day FINDINGS: Weighted enteric tube tip is in the gastric body as before. Normal bowel gas pattern. The lower abdomen is is excluded. IMPRESSION: 1. Stable placement of feeding tube tip in the gastric body. Electronically Signed   By: Corlis Leak M.D.   On: 11/10/2017 19:29   Dg Chest Port 1 View  Result Date: 11/11/2017 CLINICAL DATA:  Pancreatitis.  Shortness of breath. EXAM: PORTABLE CHEST 1 VIEW COMPARISON:  11/09/2017. FINDINGS: Interim extubation. Interim placement of feeding scratched it interim removal of NG tube placement of feeding tube. Right IJ line in stable position. Cardiomegaly. Low lung volumes. Left lower lobe infiltrate suggesting pneumonia. Asymmetric pulmonary edema could also present this fashion. Small left pleural effusion. No pneumothorax. IMPRESSION: 1. Interim extubation and removal of NG tube. Interim placement of feeding tube, its tip is below left hemidiaphragm. Right IJ line stable position. 2. Low lung volumes with basilar atelectasis. Left lower lobe infiltrate/edema and small left pleural effusion. 3.  Stable  cardiomegaly. Electronically Signed   By: Maisie Fushomas  Register   On: 11/11/2017 06:57   Dg Abd Portable 1v  Result Date: 11/10/2017 CLINICAL DATA:  Status post feeding tube placement. EXAM: PORTABLE ABDOMEN - 1 VIEW COMPARISON:  Abdominal radiograph of 07 November 2017 FINDINGS: The nasogastric tube has been removed and replaced with a radiodense tipped feeding tube. Thetube tip is in the mid gastric body. IMPRESSION: The tip of the feeding tube is in the mid gastric body. Electronically Signed   By: David  SwazilandJordan M.D.   On: 11/10/2017 11:28    ASSESSMENT / PLAN:   57 yo with severe necrotizing gallstone pancreatitis . Gallstones has  very possibly passed now based on liver labs.  She continues to be in a significant amount of pain. Temp only ~ 99 though. WBC still rising and ~ 27 today. Nausea / vomiting during the night so trickle feeds on hold since 5 am. KUB confirmed feeding tube in stomach yesterday. I spoke to Dietician and she is getting the closest thing to elemental feedings that we have on formulary. She had ~ 4 formed stools during the night / this am per RN.  -Appreciate Surgical evaluation. Hopefully she will be re-imaging soon if Nephrology agrees as contrast will be needed.  -Hospitalist is broadening antibiotics, changing to Merrem  2. AKI / on CRRT.   3. Progressive Leukocytosis.  - ? secondary to necrotizing (infected?) pancreatitis.  - Any concern for UTI? U/A with TNTC WBCs on 1/17.  - LLL infiltrate and effusion vrs edema seen on KUB yesterday.  - RN reports 3-4 formed BMS this am but there is also entry in chart reporting some loose stools. If increased frequency of loose stools will need C-diff checked.   Principal Problem:   Gallstone pancreatitis Active Problems:   Acute respiratory failure (HCC)   Hypocalcemia   Pancreatic necrosis   AKI (acute kidney injury) (HCC)    LOS: 8 days   Willette ClusterPaula Guenther ,NP 11/11/2017, 8:59 AM Pager number [phone]     Attending physician's note   I have taken an interval history, reviewed the chart and examined the patient. I agree with the Advanced Practitioner's note, impression and recommendations. Rising WBC is concerning for infection. Agree with improving antibiotic coverage with Meropenem. Proceed with abd/pelvic CT with contrast in the next few days if cleared by renal service. Check C diff if loose stool persist.   Claudette HeadMalcolm Stark, MD Clementeen GrahamFACG [phone] Mon-Fri 8a-5p [phone] after 5p, weekends, holidays

## 2017-11-11 NOTE — Progress Notes (Signed)
PULMONARY / CRITICAL CARE MEDICINE   Name: Emma Washington MRN: [id number] DOB: 1961-03-11    ADMISSION DATE:  May 20, 2018 CONSULTATION DATE:  11/04/2017  REFERRING MD:  Dr. 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 Other associated symptoms include nausea, vomiting and decreased PO intake (since 1/13). Pertinent PMHx significant for nephrolithiasis.   Initial labs show lipase >10,000, mildly elevated WBC of 11.5, creatinine 1.33, AST of 497 and ALT of 601. UA positive for ketones, protein, mucus and rare bacteria. UDS positive for opiates. CT renal stone study showed evidence of acute pancreatitis with pancreatic edema and peripancreatic fluid anteriorly to the body and tail of the pancreas as well as a 1 mm non obstructing stone in the upper pole of the left kidney.  Patient was admitted and GI consulted. GI suggested MRCP to be done today which shows cholelithiasis, possible choledocolithiasis, possible pancreatic necrosis of the body and tail, edema/inflammation in the retroperitoneal space and hepatomegaly. CXR today indicate low lung volumes and possible bilateral atelectasis vs PNA.  Patient was tachypnic with concern for decompensation which prompted a critical care consult. She is currently on protonix, zosyn, morphine, fluids, phenergan, reglan and zofran.     SUBJECTIVE:  Pt remains on CVVHD.  Nausea / vomiting this am > TF stopped.  Pt reports ongoing abdominal pain.     VITAL SIGNS: BP (!) 102/54   Pulse (!) 107   Temp 98.8 F (37.1 C) (Oral)   Resp (!) 47   Ht 5\' 9"  (1.753 m)   Wt (!) 301 lb 5.9 oz (136.7 kg)   LMP 11/07/2011   SpO2 98%   BMI 44.50 kg/m   HEMODYNAMICS:    VENTILATOR SETTINGS:    INTAKE / OUTPUT: I/O last 3 completed shifts: In: 1553.3 [I.V.:385; Other:360; NG/GT:188.3; IV Piggyback:620] Out: 9528 [UXLKG:4014852 [Urine:804;  Other:4048]  PHYSICAL EXAMINATION: General: adult female, appears uncomfortable HEENT: MM pink/dry, hirsute features Neuro: Awake, alert, oriented CV: s1s2 rrr, no m/r/g PULM: even/non-labored, lungs bilaterally diminished bases  GI: protuberant abdomen, decreased bowel sounds  Extremities: warm/dry, no overt edema  Skin: no rashes or lesions   PULMONARY Recent Labs  Lab 11/07/17 0432 11/07/17 0650  PHART 7.089* 7.191*  PCO2ART 65.4* 45.7  PO2ART 88.1 149*  HCO3 19.0* 16.9*  O2SAT 93.4 98.5    CBC Recent Labs  Lab 11/09/17 0414 11/10/17 0451 11/11/17 0540  HGB 10.1* 10.4* 9.7*  HCT 31.2* 31.6* 29.5*  WBC 16.5* 23.6* 27.1*  PLT 259 280 320    COAGULATION No results for input(s): INR in the last 168 hours.  CARDIAC  No results for input(s): TROPONINI in the last 168 hours. No results for input(s): PROBNP in the last 168 hours.   CHEMISTRY Recent Labs  Lab 11/07/17 0334  11/08/17 0601  11/09/17 0414 11/09/17 1600 11/10/17 0451 11/10/17 1653 11/11/17 0540  NA 139   < > 137   < > 138 136 139 139 138  K 4.4   < > 3.9   < > 4.2 3.9 3.9 3.9 3.9  CL 110   < > 105   < > 104 102 105 105 104  CO2 18*   < > 23   < > 25 26 26 25 25   GLUCOSE 160*   < > 161*   < > 161* 144* 150* 158* 167*  BUN 67*   < >  62*   < > 46* 34* 32* 33* 33*  CREATININE 4.59*   < > 3.75*   < > 2.68* 1.61* 1.46* 1.67* 1.72*  CALCIUM <4.0*   < > 4.9*   < > 6.2* 6.7* 6.9* 6.7* 6.5*  MG 2.2  --  2.5*  --  2.5*  --  2.6*  --  2.7*  PHOS 4.7*   < > 3.6   < > 3.2 1.5* 1.5* 1.7* 2.6  2.6   < > = values in this interval not displayed.   Estimated Creatinine Clearance: 55.1 mL/min (A) (by C-G formula based on SCr of 1.72 mg/dL (H)).   LIVER Recent Labs  Lab 11/05/17 0242 11/06/17 0248 11/07/17 0334  11/08/17 0601  11/09/17 1600 11/10/17 0451 11/10/17 1051 11/10/17 1653 11/11/17 0540  AST 92* 50* 49*  --  58*  --   --   --  55*  --   --   ALT 270* 151* 106*  --  75*  --   --   --  59*   --   --   ALKPHOS 86 75 73  --  73  --   --   --  100  --   --   BILITOT 5.3* 1.9* 0.9  --  1.0  --   --   --  1.2  --   --   PROT 5.4* 5.5* 5.8*  --  5.6*  --   --   --  5.8*  --   --   ALBUMIN 2.8* 2.8* 2.6*   < > 2.3*   < > 2.3* 2.2* 2.2* 2.0* 2.1*   < > = values in this interval not displayed.     INFECTIOUS No results for input(s): LATICACIDVEN, PROCALCITON in the last 168 hours.   ENDOCRINE CBG (last 3)  Recent Labs    11/10/17 1559 11/10/17 1926 11/10/17 2304  GLUCAP 131* 141* 128*     IMAGING   Dg Abd 1 View  Result Date: 11/10/2017 CLINICAL DATA:  NG tube placement Best obtainable notes due to patients size, pain and altered mental status. I could not get her to stop rocking and talking EXAM: ABDOMEN - 1 VIEW COMPARISON:  Earlier film of the same day FINDINGS: Weighted enteric tube tip is in the gastric body as before. Normal bowel gas pattern. The lower abdomen is is excluded. IMPRESSION: 1. Stable placement of feeding tube tip in the gastric body. Electronically Signed   By: Corlis Leak M.D.   On: 11/10/2017 19:29   Dg Chest Port 1 View  Result Date: 11/11/2017 CLINICAL DATA:  Pancreatitis.  Shortness of breath. EXAM: PORTABLE CHEST 1 VIEW COMPARISON:  11/09/2017. FINDINGS: Interim extubation. Interim placement of feeding scratched it interim removal of NG tube placement of feeding tube. Right IJ line in stable position. Cardiomegaly. Low lung volumes. Left lower lobe infiltrate suggesting pneumonia. Asymmetric pulmonary edema could also present this fashion. Small left pleural effusion. No pneumothorax. IMPRESSION: 1. Interim extubation and removal of NG tube. Interim placement of feeding tube, its tip is below left hemidiaphragm. Right IJ line stable position. 2. Low lung volumes with basilar atelectasis. Left lower lobe infiltrate/edema and small left pleural effusion. 3.  Stable cardiomegaly. Electronically Signed   By: Maisie Fus  Register   On: 11/11/2017 06:57   Dg Abd  Portable 1v  Result Date: 11/10/2017 CLINICAL DATA:  Status post feeding tube placement. EXAM: PORTABLE ABDOMEN - 1 VIEW COMPARISON:  Abdominal radiograph of  07 November 2017 FINDINGS: The nasogastric tube has been removed and replaced with a radiodense tipped feeding tube. Thetube tip is in the mid gastric body. IMPRESSION: The tip of the feeding tube is in the mid gastric body. Electronically Signed   By: David  Swaziland M.D.   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  MR abdomen/MRCP 1/15 >> cholelithiasis, possible choledocolithiasis, possible pancreatic necrosis of the body and tail, edema/inflammation in the retroperitoneal space and hepatomegaly CXR 1/15 >> Atelectasis vs PNA L>R; low lung volumes, mild cardiopericadrial silhouette enlargement. US Renal 1/17 >> normal renal US CT ABD/Pelvis 1/22 >>   CULTURES: None  ANTIBIOTICS: Zosyn 1/15 >> 1/22 Meropenem 1/22 >>   LINES ETT 1/18 >> 1/20  SIGNIFICANT EVENTS: 1/14  Admit with abd pain 1/15  Feels better, some nausea/vomiting, progressive renal dysfunction, 9L resuscitation 1/18  Decompensated 0300, intubated, ETT, rising sr cr 1/19  Intubated. On vent. CRRT; urine 25cc/h. On fent 200 -> folloiwing commands. No pressors.  1/20  SBT. CRRT / negative balance.  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  Decompensated with progressive AKI (NSAIDS, galladium, shock) & respiratory distress requiring intubation.  Persistently elevated WBC.  CCS / GI indicate no acute interventions at this time.   ASSESSMENT / PLAN:  PULMONARY A: At Risk Respiratory Failure - in setting of volume resuscitation with pancreatitis, pain with splinting. High risk ALI Diffuse Bilateral Infiltrates - suspect edema, +/- ALI L Effusion - suspect sympathetic effusion P:   Pulmonary hygiene - IS, mobilize Follow LLL on CXR   CARDIOVASCULAR A:  Tachycardia   SIRS  P:  Tele monitoring  See ID  RENAL A:   AKI / Oliguric - in setting of volume depletion (N/V, poor intake), pancreatitis. Consistent w ATN, hopeful to recover.  P:   CVVHD per Nephrology  Trend BMP / urinary output Replace electrolytes as indicated Avoid nephrotoxic agents, ensure adequate renal perfusion  GASTROINTESTINAL A:   Acute Moderately Severe Pancreatitis - concern for possible necrosis on MRCP  Gallstone Pancreatitis Abdominal Pain  Nausea / vomiting P:   PRN phenergan  NPO  Hold TF with vomiting  Repeat ABD / Pelvis CT w/o IV contrast Will need ERCP / cholecystectomy (per GI rec's) at some point PPI Appreciate GI / CCS  HEMATOLOGIC A:   Hx Anemia  P:  Trend CBC  Heparin for DVT prophylaxis   INFECTIOUS A:   Gallstone Pancreatitis - triglycerides 213 P: Empiric antibiotics > change to meropenem  Monitor for fever > culture if new fever  ENDOCRINE A:   Hyperglycemia - in setting of pancreatitis   P:   SSI Q4  Per primary  NEUROLOGIC A:   Pain - secondary to gallstone pancreatitis  P:   PRN fentanyl for pain  PT efforts as able   FAMILY  - Updates - Brother Kathlene November) updated 1/21.  No family available am 1/22.     PCCM will continue to follow patient peripherally.  Please call if new needs arise.  [name], NP-C [facility] Pgr: [phone] or if no answer [phone] 11/11/2017, 9:08 AM

## 2017-11-11 NOTE — Progress Notes (Signed)
Nutrition Follow-up  DOCUMENTATION CODES:   Morbid obesity  INTERVENTION:  Will monitor for plan concerning nutrition and provide appropriate interventions/recommendations at follow-up.   NUTRITION DIAGNOSIS:   Inadequate oral intake related to inability to eat as evidenced by NPO status. 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 Other associated symptoms include nausea, vomiting and decreased PO intake (since 1/13). Pertinent PMHx significant for nephrolithiasis.   Significant Events: 1/15- MRCP and CXR 1/18- intubated around 3:00 AM and plan for OGT placement and trickle TF initiation 1/19- CRRT started 1/20- extubated and OGT removed 1/21- Panda placed in R nare 1/22- TF stopped at 5:00 AM d/t N/V  Panda placed yesterday and imaging report states that the tip of tube is in the mid-body of stomach. Pt extubated since last RD assessment and estimated nutrition needs updated based on this and ongoing CRRT. Weight now consistent with admission weight and current weight (133.5 kg) used in re-estimating needs. Spoke with GI NP earlier this AM about ordered TF formula (Vital High Protein) and desire for elemental TF formula; current TF formula is a semi-elemental, peptide-based formula and the closest to elemental available on formulary. Vital High Protein is also the lowest fat formula available on formulary. Spoke with RN at bedside who reports TF placed on hold d/t N/V and no plan for re-start.   Per PCCM NP note this AM, pt with gallstone pancreatitis and also with oliguria/AKI d/t volume depletion from vomiting and low volume  of TF (pt only receiving VHP @ 20 mL/hr with 60 mL Prostat TID prior to TF being placed on hold). Concern for possible pancreatitic necrosis with plan for repeat abd/pelvis CT with contrast and will eventually require ERCP/cholecystectomy.   Medications reviewed; sliding scale Novolog, 40 mg IV Protonix BID. Labs reviewed; BUN: 33 mg/dL, creatinine: 7.821.72 mg/dL, Ca: 6.5 mg/dL, Mg: 2.7 mg/dL, GFR: 32 mL/min.   IVF: LR @ 10 mL/hr.      Diet Order:  Diet NPO time specified  EDUCATION NEEDS:   No education needs have been identified at this time  Skin:  Skin Assessment: Reviewed RN Assessment  Last BM:  1/22  Height:   Ht Readings from Last 1 Encounters:  11/07/17 5\' 9"  (1.753 m)    Weight:   Wt Readings from Last 1 Encounters:  11/11/17 294 lb 5 oz (133.5 kg)    Ideal Body Weight:  65.9 kg  BMI:  Body mass index is 43.46 kg/m.  Estimated Nutritional Needs:   Kcal:  [id number]   Protein:  >/= 135 grams  Fluid:  >/= 1.8 L/day      Trenton GammonJessica Maurita Havener, MS, RD, LDN, [facility] Inpatient Clinical Dietitian Pager # [phone] After hours/weekend pager # [phone]

## 2017-11-11 NOTE — Progress Notes (Signed)
Subjective: Interval History: 589 cc UOP yest,   Objective: Vital signs in last 24 hours: Temp:  [98.8 F (37.1 C)-100.3 F (37.9 C)] 100.3 F (37.9 C) (01/22 1600) Pulse Rate:  [79-116] 99 (01/22 1600) Resp:  [22-47] 32 (01/22 1600) BP: (93-151)/(29-84) 145/51 (01/22 1600) SpO2:  [92 %-100 %] 100 % (01/22 1600) Weight:  [133.5 kg (294 lb 5 oz)] 133.5 kg (294 lb 5 oz) (01/22 0940) Weight change:   Intake/Output from previous day: 01/21 0701 - 01/22 0700 In: 1283.3 [I.V.:265; NG/GT:188.3; IV Piggyback:470] Out: 3140 [Urine:589] Intake/Output this shift: Total I/O In: 282.5 [I.V.:72.5; Other:60; NG/GT:50; IV Piggyback:100] Out: 846 [Urine:183; Other:663]  General appearance: cooperative, morbidly obese and slowed mentation Neck: thyroid not enlarged, symmetric, no tenderness/mass/nodules Resp: diminished breath sounds bilaterally and rales bibasilar Cardio: S1, S2 normal GI: distended does have some bs,  Extremities: edema 2+  Lab Results: Recent Labs    11/10/17 0451 11/11/17 0540  WBC 23.6* 27.1*  HGB 10.4* 9.7*  HCT 31.6* 29.5*  PLT 280 320   BMET:  Recent Labs    11/10/17 1653 11/11/17 0540  NA 139 138  K 3.9 3.9  CL 105 104  CO2 25 25  GLUCOSE 158* 167*  BUN 33* 33*  CREATININE 1.67* 1.72*  CALCIUM 6.7* 6.5*   No results for input(s): PTH in the last 72 hours. Iron Studies: No results for input(s): IRON, TIBC, TRANSFERRIN, FERRITIN in the last 72 hours.  CXR - bibasilar atx, LLL density/ infiltrate  I have reviewed the patient's current medications.  Assessment: 1 AKI good solute clearance/acid/base/K with CRRT. UOP a little better, 580 cc yest.  2 Resp failure better. Off the vent.  3 Obesity. 4 Gallstone necrotizing pancreatitis  5 Hypocalcemia some better.  P Cont CRRT, stop pulling fluid for now, keep even and check CVP   LOS: 8 days   Maree Krabbeobert D Syenna Nazir 11/11/2017,4:59 PM

## 2017-11-11 NOTE — Progress Notes (Signed)
Patient pulled out panda feeding tube. Dr. Hanley BenAlekh made aware and MD agrees that tube can remain out because patient has not been able to tolerate tube feeds at this time.

## 2017-11-11 NOTE — Progress Notes (Signed)
Patient ID: Emma Washington, female   DOB: Jul 29, 1961, 57 y.o.   MRN: [id number]  PROGRESS NOTE    Deetya Drouillard  NWG:[id number] DOB: [birth year] DOA: 10/23/2017 PCP: No primary care provider on file.   Brief Narrative:  57 year old female with history of GERD, nephrolithiasis presented on 10/22/2017 with abdominal pain.  She was found to have acute pancreatitis with pancreatic edema and peripancreatic fluid along with elevated LFTs.  GI was consulted.  MRCP showed cholelithiasis, possible choledocholithiasis, possible pancreatic necrosis of the body and tail, edema/inflammation in the retroperitoneal space and hepatomegaly.  Patient started on broad-spectrum antibiotics.  Patient's course has been complicated with respiratory failure and intubation and subsequent extubation on 11/09/2017.  He was also started on CRRT and nephrology is following.  Patient was transferred to hospitalist service on 11/11/2017.  Assessment & Plan:   Principal Problem:   Gallstone pancreatitis Active Problems:   Acute respiratory failure (HCC)   Hypocalcemia   Pancreatic necrosis   AKI (acute kidney injury) (HCC)   Necrotizing gallstone pancreatitis with choledocholithiasis and acute transaminitis -GI following.  GI thinks that the CBD stone has probably passed as LFTs have improved. -Currently on Zosyn.  Worsening leukocytosis.  Switch antibiotics to meropenem.  General surgery also following. -Ideally the patient would require CT with contrast but with renal failure, will avoid the contrast less than nephrology gives clearance.  CT without contrast has been ordered by critical care team -We will also get blood cultures because of worsening leukocytosis -Patient is still in severe pain.  Continue current pain management.  Fentanyl patch has also been added. -Repeat a.m. LFTs  Leukocytosis -Worsening.  Plan as above.  Repeat a.m. Labs  Acute hypoxic respiratory failure -Status post intubation and extubation.   Monitor respiratory status.  Patient is at very high risk to develop respiratory failure again.  Critical care still following.  Acute kidney injury -Currently undergoing CRRT as per nephrology.  Repeat a.m. Labs  Normocytic anemia -Hemoglobin stable.  Monitor   DVT prophylaxis: Heparin Code Status: Full Family Communication: None at bedside Disposition Plan: Depends on clinical outcome  Consultants: PCCM, GI, general surgery, nephrology  Procedures:  Intubation on 11/07/2017 and extubation on 11/09/2017 CRRT started on 11/07/2017  Antimicrobials: Zosyn from 11/19/2017 onwards  Subjective: Patient seen and examined at bedside.  She is awake but is screaming in severe pain in her abdomen and her back.  No overnight fever.  She had nausea and vomiting this morning.  Objective: Vitals:   11/11/17 0800 11/11/17 0905 11/11/17 0925 11/11/17 0940  BP: (!) 111/31 (!) 102/54 116/61   Pulse: 79 (!) 107 (!) 103   Resp: (!) 34 (!) 47 (!) 34   Temp: 98.8 F (37.1 C)     TempSrc: Oral     SpO2: 100% 98% 98%   Weight:    133.5 kg (294 lb 5 oz)  Height:        Intake/Output Summary (Last 24 hours) at 11/11/2017 0949 Last data filed at 11/11/2017 0901 Gross per 24 hour  Intake 1278.34 ml  Output 3130 ml  Net -1851.66 ml   Filed Weights   11/09/17 0500 11/10/17 0300 11/11/17 0940  Weight: (!) 141.2 kg (311 lb 4.6 oz) (!) 136.7 kg (301 lb 5.9 oz) 133.5 kg (294 lb 5 oz)    Examination:  General exam: Ill-looking female lying in bed in moderate distress secondary to pain Respiratory system: Bilateral decreased breath sound at bases scattered crackles, tachypneic Cardiovascular system: S1 &  S2 heard, tachycardic.  Gastrointestinal system: Abdomen is distended, diffusely tender.  Bowel sounds sluggish Extremities: No cyanosis, clubbing; trace edema    Data Reviewed: I have personally reviewed following labs and imaging studies  CBC: Recent Labs  Lab 11/06/17 0248  11/07/17 0933  11/08/17 0601 11/09/17 0414 11/10/17 0451 11/11/17 0540  WBC 12.4*   < > 10.4 11.6* 16.5* 23.6* 27.1*  NEUTROABS 11.1*  --   --   --  14.8* 21.3*  --   HGB 12.2   < > 11.0* 10.5* 10.1* 10.4* 9.7*  HCT 36.9   < > 33.4* 32.4* 31.2* 31.6* 29.5*  MCV 90.2   < > 92.0 91.5 92.3 90.5 91.6  PLT 224   < > 244 265 259 280 320   < > = values in this interval not displayed.   Basic Metabolic Panel: Recent Labs  Lab 11/07/17 0334  11/08/17 0601  11/09/17 0414 11/09/17 1600 11/10/17 0451 11/10/17 1653 11/11/17 0540  NA 139   < > 137   < > 138 136 139 139 138  K 4.4   < > 3.9   < > 4.2 3.9 3.9 3.9 3.9  CL 110   < > 105   < > 104 102 105 105 104  CO2 18*   < > 23   < > 25 26 26 25 25   GLUCOSE 160*   < > 161*   < > 161* 144* 150* 158* 167*  BUN 67*   < > 62*   < > 46* 34* 32* 33* 33*  CREATININE 4.59*   < > 3.75*   < > 2.68* 1.61* 1.46* 1.67* 1.72*  CALCIUM <4.0*   < > 4.9*   < > 6.2* 6.7* 6.9* 6.7* 6.5*  MG 2.2  --  2.5*  --  2.5*  --  2.6*  --  2.7*  PHOS 4.7*   < > 3.6   < > 3.2 1.5* 1.5* 1.7* 2.6  2.6   < > = values in this interval not displayed.   GFR: Estimated Creatinine Clearance: 54.3 mL/min (A) (by C-G formula based on SCr of 1.72 mg/dL (H)). Liver Function Tests: Recent Labs  Lab 11/05/17 0242 11/06/17 0248 11/07/17 0334  11/08/17 0601  11/09/17 1600 11/10/17 0451 11/10/17 1051 11/10/17 1653 11/11/17 0540  AST 92* 50* 49*  --  58*  --   --   --  55*  --   --   ALT 270* 151* 106*  --  75*  --   --   --  59*  --   --   ALKPHOS 86 75 73  --  73  --   --   --  100  --   --   BILITOT 5.3* 1.9* 0.9  --  1.0  --   --   --  1.2  --   --   PROT 5.4* 5.5* 5.8*  --  5.6*  --   --   --  5.8*  --   --   ALBUMIN 2.8* 2.8* 2.6*   < > 2.3*   < > 2.3* 2.2* 2.2* 2.0* 2.1*   < > = values in this interval not displayed.   Recent Labs  Lab 11/05/17 0242 11/10/17 1051  LIPASE 850* 22   No results for input(s): AMMONIA in the last 168 hours. Coagulation Profile: No results for  input(s): INR, PROTIME in the last 168 hours. Cardiac Enzymes: Recent Labs  Lab  11/06/17 0248  CKTOTAL 710*   BNP (last 3 results) No results for input(s): PROBNP in the last 8760 hours. HbA1C: No results for input(s): HGBA1C in the last 72 hours. CBG: Recent Labs  Lab 11/10/17 0718 11/10/17 1140 11/10/17 1559 11/10/17 1926 11/10/17 2304  GLUCAP 130* 141* 131* 141* 128*   Lipid Profile: No results for input(s): CHOL, HDL, LDLCALC, TRIG, CHOLHDL, LDLDIRECT in the last 72 hours. Thyroid Function Tests: No results for input(s): TSH, T4TOTAL, FREET4, T3FREE, THYROIDAB in the last 72 hours. Anemia Panel: No results for input(s): VITAMINB12, FOLATE, FERRITIN, TIBC, IRON, RETICCTPCT in the last 72 hours. Sepsis Labs: No results for input(s): PROCALCITON, LATICACIDVEN in the last 168 hours.  Recent Results (from the past 240 hour(s))  MRSA PCR Screening     Status: None   Collection Time: 11/25/2017  6:37 PM  Result Value Ref Range Status   MRSA by PCR NEGATIVE NEGATIVE Final    Comment:        The GeneXpert MRSA Assay (FDA approved for NASAL specimens only), is one component of a comprehensive MRSA colonization surveillance program. It is not intended to diagnose MRSA infection nor to guide or monitor treatment for MRSA infections.   Surgical pcr screen     Status: None   Collection Time: 11/04/17  4:57 AM  Result Value Ref Range Status   MRSA, PCR NEGATIVE NEGATIVE Final   Staphylococcus aureus NEGATIVE NEGATIVE Final    Comment: (NOTE) The Xpert SA Assay (FDA approved for NASAL specimens in patients 77 years of age and older), is one component of a comprehensive surveillance program. It is not intended to diagnose infection nor to guide or monitor treatment.          Radiology Studies: Dg Abd 1 View  Result Date: 11/10/2017 CLINICAL DATA:  NG tube placement Best obtainable notes due to patients size, pain and altered mental status. I could not get her to  stop rocking and talking EXAM: ABDOMEN - 1 VIEW COMPARISON:  Earlier film of the same day FINDINGS: Weighted enteric tube tip is in the gastric body as before. Normal bowel gas pattern. The lower abdomen is is excluded. IMPRESSION: 1. Stable placement of feeding tube tip in the gastric body. Electronically Signed   By: Corlis Leak M.D.   On: 11/10/2017 19:29   Dg Chest Port 1 View  Result Date: 11/11/2017 CLINICAL DATA:  Pancreatitis.  Shortness of breath. EXAM: PORTABLE CHEST 1 VIEW COMPARISON:  11/09/2017. FINDINGS: Interim extubation. Interim placement of feeding scratched it interim removal of NG tube placement of feeding tube. Right IJ line in stable position. Cardiomegaly. Low lung volumes. Left lower lobe infiltrate suggesting pneumonia. Asymmetric pulmonary edema could also present this fashion. Small left pleural effusion. No pneumothorax. IMPRESSION: 1. Interim extubation and removal of NG tube. Interim placement of feeding tube, its tip is below left hemidiaphragm. Right IJ line stable position. 2. Low lung volumes with basilar atelectasis. Left lower lobe infiltrate/edema and small left pleural effusion. 3.  Stable cardiomegaly. Electronically Signed   By: Maisie Fus  Register   On: 11/11/2017 06:57   Dg Abd Portable 1v  Result Date: 11/10/2017 CLINICAL DATA:  Status post feeding tube placement. EXAM: PORTABLE ABDOMEN - 1 VIEW COMPARISON:  Abdominal radiograph of 07 November 2017 FINDINGS: The nasogastric tube has been removed and replaced with a radiodense tipped feeding tube. Thetube tip is in the mid gastric body. IMPRESSION: The tip of the feeding tube is in the mid gastric body.  Electronically Signed   By: David  [place]aziland[place] M.D.   On: 11/10/2017 11:28        Scheduled Meds: . chlorhexidine  15 mL Mouth Rinse BID  . feeding supplement (PRO-STAT SUGAR FREE 64)  60 mL Per Tube TID  . feeding supplement (VITAL HIGH PROTEIN)  1,000 mL Per Tube Q24H  . fentaNYL  50 mcg Transdermal Q72H  .  insulin aspart  0-9 Units Subcutaneous Q4H  . mouth rinse  15 mL Mouth Rinse q12n4p  . pantoprazole (PROTONIX) IV  40 mg Intravenous Q12H   Continuous Infusions: . heparin 10,000 units/ 20 mL infusion syringe 2,500 Units/hr (11/11/17 0901)  . lactated ringers 10 mL/hr at 11/11/17 0900  . meropenem (MERREM) IV    . dialysis replacement fluid (prismasate) 800 mL/hr at 11/11/17 0301  . dialysis replacement fluid (prismasate) 700 mL/hr at 11/11/17 0534  . dialysate (PRISMASATE) 1,500 mL/hr at 11/11/17 0212  . sodium chloride       LOS: 8 days        Glade LloydKshitiz Xayden Linsey, MD [facility] Pager [phone]  If 7PM-7AM, please contact night-coverage [email or web address] Password [email or web address] 11/11/2017, 9:49 AM

## 2017-11-12 DIAGNOSIS — D649 Anemia, unspecified: Secondary | ICD-10-CM | POA: Diagnosis present

## 2017-11-12 LAB — GLUCOSE, CAPILLARY
GLUCOSE-CAPILLARY: 122 mg/dL — AB (ref 65–99)
GLUCOSE-CAPILLARY: 129 mg/dL — AB (ref 65–99)
GLUCOSE-CAPILLARY: 142 mg/dL — AB (ref 65–99)
Glucose-Capillary: 113 mg/dL — ABNORMAL HIGH (ref 65–99)
Glucose-Capillary: 126 mg/dL — ABNORMAL HIGH (ref 65–99)
Glucose-Capillary: 128 mg/dL — ABNORMAL HIGH (ref 65–99)
Glucose-Capillary: 131 mg/dL — ABNORMAL HIGH (ref 65–99)

## 2017-11-12 LAB — COMPREHENSIVE METABOLIC PANEL
ALBUMIN: 1.9 g/dL — AB (ref 3.5–5.0)
ALT: 64 U/L — AB (ref 14–54)
AST: 77 U/L — ABNORMAL HIGH (ref 15–41)
Alkaline Phosphatase: 125 U/L (ref 38–126)
Anion gap: 11 (ref 5–15)
BILIRUBIN TOTAL: 1.2 mg/dL (ref 0.3–1.2)
BUN: 31 mg/dL — AB (ref 6–20)
CO2: 25 mmol/L (ref 22–32)
CREATININE: 1.47 mg/dL — AB (ref 0.44–1.00)
Calcium: 6.8 mg/dL — ABNORMAL LOW (ref 8.9–10.3)
Chloride: 104 mmol/L (ref 101–111)
GFR calc Af Amer: 45 mL/min — ABNORMAL LOW (ref >60)
GFR calc non Af Amer: 39 mL/min — ABNORMAL LOW (ref >60)
GLUCOSE: 140 mg/dL — AB (ref 65–99)
POTASSIUM: 4.4 mmol/L (ref 3.5–5.1)
Sodium: 140 mmol/L (ref 135–145)
TOTAL PROTEIN: 5.4 g/dL — AB (ref 6.5–8.1)

## 2017-11-12 LAB — CBC WITH DIFFERENTIAL/PLATELET
Basophils Absolute: 0.2 10*3/uL — ABNORMAL HIGH (ref 0.0–0.1)
Basophils Relative: 1 %
Eosinophils Absolute: 0.2 10*3/uL (ref 0.0–0.7)
Eosinophils Relative: 1 %
HEMATOCRIT: 27.5 % — AB (ref 36.0–46.0)
HEMOGLOBIN: 9 g/dL — AB (ref 12.0–15.0)
LYMPHS ABS: 1.7 10*3/uL (ref 0.7–4.0)
LYMPHS PCT: 6 %
MCH: 30.1 pg (ref 26.0–34.0)
MCHC: 32.7 g/dL (ref 30.0–36.0)
MCV: 92 fL (ref 78.0–100.0)
MONOS PCT: 5 %
Monocytes Absolute: 1.4 10*3/uL (ref 0.1–1.0)
NEUTROS PCT: 89 %
Neutro Abs: 26.9 10*3/uL (ref 1.7–7.7)
Platelets: 314 10*3/uL (ref 150–400)
RBC: 2.99 MIL/uL — AB (ref 3.87–5.11)
RDW: 16.8 % — ABNORMAL HIGH (ref 11.5–15.5)
WBC Morphology: INCREASED
WBC: 30.3 10*3/uL — AB (ref 4.0–10.5)

## 2017-11-12 LAB — PHOSPHORUS: PHOSPHORUS: 2.5 mg/dL (ref 2.5–4.6)

## 2017-11-12 LAB — MAGNESIUM: Magnesium: 2.5 mg/dL — ABNORMAL HIGH (ref 1.7–2.4)

## 2017-11-12 LAB — APTT: aPTT: 48 seconds — ABNORMAL HIGH (ref 24–36)

## 2017-11-12 MED ORDER — SODIUM CHLORIDE 0.9 % IV SOLN
INTRAVENOUS | Status: DC
  Administered 2017-11-12 (×2): via INTRAVENOUS

## 2017-11-12 MED ORDER — LORAZEPAM 0.5 MG PO TABS
0.5000 mg | ORAL_TABLET | Freq: Once | ORAL | Status: DC
  Filled 2017-11-12: qty 1

## 2017-11-12 MED ORDER — LORAZEPAM 2 MG/ML IJ SOLN
0.5000 mg | Freq: Once | INTRAMUSCULAR | Status: AC
  Administered 2017-11-12: 0.5 mg via INTRAVENOUS
  Filled 2017-11-12: qty 1

## 2017-11-12 MED ORDER — ONDANSETRON HCL 4 MG/2ML IJ SOLN
4.0000 mg | Freq: Four times a day (QID) | INTRAMUSCULAR | Status: DC | PRN
  Administered 2017-11-12 – 2017-11-17 (×12): 4 mg via INTRAVENOUS
  Filled 2017-11-12 (×14): qty 2

## 2017-11-12 MED ORDER — SODIUM CHLORIDE 0.9 % IV SOLN
1.0000 g | Freq: Three times a day (TID) | INTRAVENOUS | Status: DC
  Administered 2017-11-12 – 2017-11-17 (×15): 1 g via INTRAVENOUS
  Filled 2017-11-12 (×15): qty 1

## 2017-11-12 NOTE — Progress Notes (Signed)
[facility] Progress Note     Subjective: CC: pancreatitis Patient currently states pain is slightly improved from yesterday, feels pain mostly in upper abdomen. Denies nausea this AM. Per RN had a few watery stools. Tolerating TF  Objective: Vital signs in last 24 hours: Temp:  [99 F (37.2 C)-100.3 F (37.9 C)] 99 F (37.2 C) (01/22 2000) Pulse Rate:  [95-112] 107 (01/23 0700) Resp:  [25-47] 44 (01/23 0700) BP: (102-151)/(29-87) 144/64 (01/23 0700) SpO2:  [94 %-100 %] 96 % (01/23 0700) Weight:  [128.5 kg (283 lb 4.7 oz)-133.5 kg (294 lb 5 oz)] 128.5 kg (283 lb 4.7 oz) (01/23 0500) Last BM Date: 11/11/17  Intake/Output from previous day: 01/22 0701 - 01/23 0700 In: 532.5 [I.V.:222.5; NG/GT:50; IV Piggyback:200] Out: 1453 [Urine:665] Intake/Output this shift: No intake/output data recorded.  PE: Gen:  Resting, NAD Card:  sinus tachycardia, pedal pulses 2+ BL Pulm:  tachypneic, clear to auscultation bilaterally Abd: Soft, diffusely tender, distended, bowel sounds hypoactve, no HSM Skin: warm and dry, no rashes    Lab Results:  Recent Labs    11/11/17 0540 11/12/17 0417  WBC 27.1* 30.3*  HGB 9.7* 9.0*  HCT 29.5* 27.5*  PLT 320 314   BMET Recent Labs    11/11/17 1618 11/12/17 0417  NA 138 140  K 4.0 4.4  CL 103 104  CO2 22 25  GLUCOSE 148* 140*  BUN 32* 31*  CREATININE 1.57* 1.47*  CALCIUM 6.6* 6.8*   PT/INR No results for input(s): LABPROT, INR in the last 72 hours. CMP     Component Value Date/Time   NA 140 11/12/2017 0417   K 4.4 11/12/2017 0417   CL 104 11/12/2017 0417   CO2 25 11/12/2017 0417   GLUCOSE 140 (H) 11/12/2017 0417   BUN 31 (H) 11/12/2017 0417   CREATININE 1.47 (H) 11/12/2017 0417   CALCIUM 6.8 (L) 11/12/2017 0417   PROT 5.4 (L) 11/12/2017 0417   ALBUMIN 1.9 (L) 11/12/2017 0417   AST 77 (H) 11/12/2017 0417   ALT 64 (H) 11/12/2017 0417   ALKPHOS 125 11/12/2017 0417   BILITOT 1.2 11/12/2017 0417   GFRNONAA 39 (L)  11/12/2017 0417   GFRAA 45 (L) 11/12/2017 0417   Lipase     Component Value Date/Time   LIPASE 22 11/10/2017 1051       Studies/Results: Ct Abdomen Pelvis Wo Contrast  Result Date: 11/11/2017 CLINICAL DATA:  57 year old female with a history of necrotizing pancreatitis now with worsening leukocytosis. Evaluate for evidence of superinfection. EXAM: CT ABDOMEN AND PELVIS WITHOUT CONTRAST TECHNIQUE: Multidetector CT imaging of the abdomen and pelvis was performed following the standard protocol without IV contrast. COMPARISON:  Prior CT scan of the abdomen and pelvis 10/23/2017 FINDINGS: Lower chest: Small bilateral pleural effusions and associated lower lobe atelectasis. The intracardiac blood pool is hypodense relative to the adjacent myocardium consistent with anemia. No pericardial effusion. Unremarkable distal thoracic esophagus. Hepatobiliary: Severe hypoattenuation of the hepatic parenchyma consistent with hepatic steatosis. There is some sparing around the gallbladder fossa. High attenuation material within the gallbladder consistent with sludge and/or small stones. No intra or extrahepatic biliary ductal dilatation. Pancreas: The pancreas is diffusely edematous. No discrete mass or pseudocyst. Inflammatory stranding extends throughout the peripancreatic soft tissues and into the mesenteric root. The degree of peripancreatic inflammatory stranding has significantly progressed compared to 11/09/2017. Evaluation is limited in the absence of intravenous contrast. Spleen: Normal in size without focal abnormality. Adrenals/Urinary Tract: Adrenal glands are unremarkable. Kidneys  are normal, without renal calculi, focal lesion, or hydronephrosis. Foley catheter in the bladder. Stomach/Bowel: No evidence of obstruction or focal bowel wall thickening. Normal appendix in the right lower quadrant. The terminal ileum is unremarkable. Vascular/Lymphatic: Limited evaluation in the absence of intravenous  contrast. No aneurysm, significant atherosclerotic calcifications or suspicious lymphadenopathy. Reproductive: Uterus and bilateral adnexa are unremarkable. Other: Progressive inflammatory stranding throughout the mesenteric root and peripancreatic soft tissues. Small volume ascites along the greater curvature of the stomach, in the left perisplenic space and extending inferiorly along the left greater than right colic gutters to layer in the anatomic pelvis. No loculation or intra fluid gas to suggest infection. Musculoskeletal: No acute fracture or aggressive appearing lytic or blastic osseous lesion. IMPRESSION: 1. Progressive severe pancreatitis and secondary inflammatory changes throughout the peripancreatic soft tissues and mesenteric root. There is now small volume ascites which is likely reactive in nature. No loculation or intra fluid gas to suggest infection. 2. Small bilateral pleural effusions and associated atelectasis. 3. The intracardiac blood pool is hypodense relative to the adjacent myocardium consistent with anemia. 4. At least moderate hepatic steatosis. 5. Cholelithiasis. Electronically Signed   By: Malachy Moan M.D.   On: 11/11/2017 14:57   Dg Abd 1 View  Result Date: 11/10/2017 CLINICAL DATA:  NG tube placement Best obtainable notes due to patients size, pain and altered mental status. I could not get her to stop rocking and talking EXAM: ABDOMEN - 1 VIEW COMPARISON:  Earlier film of the same day FINDINGS: Weighted enteric tube tip is in the gastric body as before. Normal bowel gas pattern. The lower abdomen is is excluded. IMPRESSION: 1. Stable placement of feeding tube tip in the gastric body. Electronically Signed   By: Corlis Leak M.D.   On: 11/10/2017 19:29   Dg Chest Port 1 View  Result Date: 11/11/2017 CLINICAL DATA:  Pancreatitis.  Shortness of breath. EXAM: PORTABLE CHEST 1 VIEW COMPARISON:  11/09/2017. FINDINGS: Interim extubation. Interim placement of feeding scratched  it interim removal of NG tube placement of feeding tube. Right IJ line in stable position. Cardiomegaly. Low lung volumes. Left lower lobe infiltrate suggesting pneumonia. Asymmetric pulmonary edema could also present this fashion. Small left pleural effusion. No pneumothorax. IMPRESSION: 1. Interim extubation and removal of NG tube. Interim placement of feeding tube, its tip is below left hemidiaphragm. Right IJ line stable position. 2. Low lung volumes with basilar atelectasis. Left lower lobe infiltrate/edema and small left pleural effusion. 3.  Stable cardiomegaly. Electronically Signed   By: Maisie Fus  Register   On: 11/11/2017 06:57   Dg Abd Portable 1v  Result Date: 11/10/2017 CLINICAL DATA:  Status post feeding tube placement. EXAM: PORTABLE ABDOMEN - 1 VIEW COMPARISON:  Abdominal radiograph of 07 November 2017 FINDINGS: The nasogastric tube has been removed and replaced with a radiodense tipped feeding tube. Thetube tip is in the mid gastric body. IMPRESSION: The tip of the feeding tube is in the mid gastric body. Electronically Signed   By: David  [place] M.D.   On: 11/10/2017 11:28    Anti-infectives: Anti-infectives (From admission, onward)   Start     Dose/Rate Route Frequency Ordered Stop   11/11/17 1000  meropenem (MERREM) 1 g in sodium chloride 0.9 % 100 mL IVPB     1 g 200 mL/hr over 30 Minutes Intravenous Every 12 hours 11/11/17 0911     11/09/17 1000  piperacillin-tazobactam (ZOSYN) IVPB 3.375 g  Status:  Discontinued     3.375 g  12.5 mL/hr over 240 Minutes Intravenous Every 8 hours 11/09/17 0614 11/09/17 0615   11/09/17 1000  piperacillin-tazobactam (ZOSYN) IVPB 3.375 g  Status:  Discontinued     3.375 g 100 mL/hr over 30 Minutes Intravenous Every 6 hours 11/09/17 0616 11/11/17 0911   11/07/17 2200  piperacillin-tazobactam (ZOSYN) IVPB 2.25 g  Status:  Discontinued     2.25 g 100 mL/hr over 30 Minutes Intravenous Every 8 hours 11/07/17 1218 11/07/17 1741   11/07/17 2200   piperacillin-tazobactam (ZOSYN) IVPB 3.375 g  Status:  Discontinued     3.375 g 12.5 mL/hr over 240 Minutes Intravenous Every 6 hours 11/07/17 1741 11/07/17 1747   11/07/17 2200  piperacillin-tazobactam (ZOSYN) IVPB 3.375 g  Status:  Discontinued     3.375 g 100 mL/hr over 30 Minutes Intravenous Every 6 hours 11/07/17 1747 11/09/17 0613   11/04/17 1100  piperacillin-tazobactam (ZOSYN) IVPB 3.375 g  Status:  Discontinued     3.375 g 12.5 mL/hr over 240 Minutes Intravenous Every 8 hours 11/04/17 1059 11/07/17 1218   11/22/17 1200  piperacillin-tazobactam (ZOSYN) IVPB 3.375 g     3.375 g 100 mL/hr over 30 Minutes Intravenous  Once 11-22-2017 1157 November 22, 2017 1309       Assessment/Plan ARF - on CRRT, Cr 1.47 VDRF - extubated, stable Obesity Nephrolithiasis  Gallstone pancreatitis - MRCP on 1/15 with concern for pancreatic necrosis  - repeat LFTs and lipase normalized; WBC up to 30, trending up (?inflammatory vs other sources of infection) - no signs of peritonitis or indications for acute surgical intervention at this time. Surgery typically not indicated in the acute phase with pancreatic necrosis - repeat CT yesterday showed severe pancreatitis without signs of infection  - continue nutritional support and antibiotics   FEN: NPO, IVF; getting enteral TFs  VTE: SCDs, heparin for CRRT ID: IV zosyn 1/14>1/22; IV merrem 1/22>>    LOS: 9 days    Wells Guiles , [facility] Surgery 11/12/2017, 8:08 AM Pager: [phone] Mon-Fri 7:00 am-4:30 pm Sat-Sun 7:00 am-11:30 am

## 2017-11-12 NOTE — Progress Notes (Signed)
[facility] Progress Note  Chief Complaint:    Pancreatitis   Subjective: Feels better today compared to previous days.   Objective:  Vital signs in last 24 hours: Temp:  [97.7 F (36.5 C)-100.3 F (37.9 C)] 97.7 F (36.5 C) (01/23 0800) Pulse Rate:  [95-112] 104 (01/23 0900) Resp:  [25-44] 30 (01/23 0900) BP: (107-151)/(29-101) 126/101 (01/23 0900) SpO2:  [93 %-100 %] 93 % (01/23 0900) Weight:  [283 lb 4.7 oz (128.5 kg)] 283 lb 4.7 oz (128.5 kg) (01/23 0500) Last BM Date: 11/12/17 General:   Alert, obese white female in NAD EENT:  Normal hearing, non icteric sclera, conjunctive pink.  Heart:  Sinus tachycardia. No lower extremity edema Pulm: Normal respiratory effort, Abdomen:  Soft, distended with hypoactive bowel sounds. Less tender today.  Neurologic:  Alert and  oriented x4;  grossly normal neurologically. Psych:  Pleasant, cooperative.  Normal mood and affect.   Intake/Output from previous day: 01/22 0701 - 01/23 0700 In: 532.5 [I.V.:222.5; NG/GT:50; IV Piggyback:200] Out: 1453 [Urine:665] Intake/Output this shift: Total I/O In: 10 [I.V.:10] Out: 28 [Urine:18; Other:10]  Lab Results: Recent Labs    11/10/17 0451 11/11/17 0540 11/12/17 0417  WBC 23.6* 27.1* 30.3*  HGB 10.4* 9.7* 9.0*  HCT 31.6* 29.5* 27.5*  PLT 280 320 314   BMET Recent Labs    11/11/17 0540 11/11/17 1618 11/12/17 0417  NA 138 138 140  K 3.9 4.0 4.4  CL 104 103 104  CO2 25 22 25   GLUCOSE 167* 148* 140*  BUN 33* 32* 31*  CREATININE 1.72* 1.57* 1.47*  CALCIUM 6.5* 6.6* 6.8*   LFT Recent Labs    11/10/17 1051  11/12/17 0417  PROT 5.8*  --  5.4*  ALBUMIN 2.2*   < > 1.9*  AST 55*  --  77*  ALT 59*  --  64*  ALKPHOS 100  --  125  BILITOT 1.2  --  1.2  BILIDIR 0.5  --   --   IBILI 0.7  --   --    < > = values in this interval not displayed.    Ct Abdomen Pelvis Wo Contrast  Result Date: 11/11/2017 CLINICAL DATA:  57 year old female with a history of  necrotizing pancreatitis now with worsening leukocytosis. Evaluate for evidence of superinfection. EXAM: CT ABDOMEN AND PELVIS WITHOUT CONTRAST TECHNIQUE: Multidetector CT imaging of the abdomen and pelvis was performed following the standard protocol without IV contrast. COMPARISON:  Prior CT scan of the abdomen and pelvis 12-Nov-2017 FINDINGS: Lower chest: Small bilateral pleural effusions and associated lower lobe atelectasis. The intracardiac blood pool is hypodense relative to the adjacent myocardium consistent with anemia. No pericardial effusion. Unremarkable distal thoracic esophagus. Hepatobiliary: Severe hypoattenuation of the hepatic parenchyma consistent with hepatic steatosis. There is some sparing around the gallbladder fossa. High attenuation material within the gallbladder consistent with sludge and/or small stones. No intra or extrahepatic biliary ductal dilatation. Pancreas: The pancreas is diffusely edematous. No discrete mass or pseudocyst. Inflammatory stranding extends throughout the peripancreatic soft tissues and into the mesenteric root. The degree of peripancreatic inflammatory stranding has significantly progressed compared to 2017/11/12. Evaluation is limited in the absence of intravenous contrast. Spleen: Normal in size without focal abnormality. Adrenals/Urinary Tract: Adrenal glands are unremarkable. Kidneys are normal, without renal calculi, focal lesion, or hydronephrosis. Foley catheter in the bladder. Stomach/Bowel: No evidence of obstruction or focal bowel wall thickening. Normal appendix in the right lower quadrant. The terminal ileum is unremarkable. Vascular/Lymphatic:  Limited evaluation in the absence of intravenous contrast. No aneurysm, significant atherosclerotic calcifications or suspicious lymphadenopathy. Reproductive: Uterus and bilateral adnexa are unremarkable. Other: Progressive inflammatory stranding throughout the mesenteric root and peripancreatic soft tissues.  Small volume ascites along the greater curvature of the stomach, in the left perisplenic space and extending inferiorly along the left greater than right colic gutters to layer in the anatomic pelvis. No loculation or intra fluid gas to suggest infection. Musculoskeletal: No acute fracture or aggressive appearing lytic or blastic osseous lesion. IMPRESSION: 1. Progressive severe pancreatitis and secondary inflammatory changes throughout the peripancreatic soft tissues and mesenteric root. There is now small volume ascites which is likely reactive in nature. No loculation or intra fluid gas to suggest infection. 2. Small bilateral pleural effusions and associated atelectasis. 3. The intracardiac blood pool is hypodense relative to the adjacent myocardium consistent with anemia. 4. At least moderate hepatic steatosis. 5. Cholelithiasis. Electronically Signed   By: Malachy MoanHeath  McCullough M.D.   On: 11/11/2017 14:57     ASSESSMENT / PLAN:    1. 57 yo female with severe necrotizing gallstone pancreatitis. CBD stone likely passed or is no longer obstructing.  No indication for surgical intervention at this time. Her WBC continues to rise, up to 30K today but she actually feels better.  -Antbiotics changed to Merrem yesterday.  -Appreciate Surgery's help.  -Noncontrast CT scan yesterday suggests significant progression of pancreatitis with inflammatory changes throughout peripancreatic soft tissues and mesentic root. Nothing to suggest infection (loculation or gas).  -Continue supportive care for now.  2. AKI , on CRRT. Improving   Principal Problem:   Gallstone pancreatitis Active Problems:   Hypocalcemia   Pancreatic necrosis   AKI (acute kidney injury) (HCC)   Anemia   Obesity, Class III, BMI 40-49.9 (morbid obesity) (HCC)    LOS: 9 days   Willette ClusterPaula Guenther ,NP 11/12/2017, 10:45 AM Pager number [phone]    Attending physician's note   I have taken an interval history, reviewed the chart and  examined the patient. I agree with the Advanced Practitioner's note, impression and recommendations. Severe necrotizing pancreatitis. Noncontrasted CT from yesterday reviewed. Abdominal pain and tenderness are improved today. WBC has increased to 30k. Continue current mgmt.   Claudette HeadMalcolm Akiko Schexnider, MD Clementeen GrahamFACG [phone] Mon-Fri 8a-5p [phone] after 5p, weekends, holidays

## 2017-11-12 NOTE — Progress Notes (Signed)
PROGRESS NOTE  Jannet MantisJoyce Polendo ZOX:[id number] DOB: July 25, 1961 DOA: 10/28/2017 PCP: No primary care provider on file.  Brief Narrative:   Assessment/Plan Progressive severe necrotizing gallstone pancreatitis with inflammatory changes as seen on CT abd/pelvis 1/22. Concern for necrotizing pancreatitis.  - GI suspects CBD stone passed. - surgery following, no plans for intervention for now - WBC w/o sig change, 30.3. Afebrile. Continue empiric meropenem.  AKI. - stable; continue CRRT per nephrology  Anemia of acute illness - slowly trending down - check fecal occult blood - CBC in AM  Hypocalcemia - stable. Corrected calcium is 8.5   Acute hypoxic resp failure - wean oxygen as tolerated   S/p VDRF  Moderate hepatic steatosis with chronic elevation of transaminases  Morbid obesity  - per nutrition  Nutrition - didn't tolerate TF per chart. TNA?   DVT prophylaxis: SCDs Code Status: full Family Communication: none Disposition Plan: pending    Brendia Sacksaniel Goodrich, MD  [facility] Direct contact: [phone] --Via amion app OR  --[email or web address]; password [email or web address]  7PM-7AM contact night coverage as above 11/12/2017, 10:02 AM  LOS: 9 days   Consultants:  General surgery  Procedures:    Antimicrobials:  Meropenem 1/23 >>  Zosyn 1/14 >> 1/22  Interval history/Subjective: AF VSS. Labs w/o sig change. Surgery following, no acute surgical intervention planned at this time. Patient pulled out PANDA tube.  Feels better today; much less abd pain. Using suction, perhaps mild vomiting. Not short of breath.  Objective: Vitals:  Vitals:   11/12/17 0800 11/12/17 0900  BP: 140/73 (!) 126/101  Pulse: (!) 108 (!) 104  Resp: (!) 30 (!) 30  Temp: 97.7 F (36.5 C)   SpO2: 97% 93%    Exam:  Constitutional:  . Appears calm, ill but not toxic Eyes:  . pupils and irises appear normal . Normal lids   ENMT:  . grossly normal hearing  . Lips appear  normal Respiratory:  . CTA bilaterally, no w/r/r.  . Respiratory mildly increased Cardiovascular:  . tachycardic, no m/r/g . No LE extremity edema   Abdomen:  . Obese, mild epigastric, LLQ pain Skin:  . No rashes, lesions, ulcers Psychiatric:  . Mental status o Mood, affect appropriate   I have personally reviewed the following:  Filed Weights   11/10/17 0300 11/11/17 0940 11/12/17 0500  Weight: (!) 136.7 kg (301 lb 5.9 oz) 133.5 kg (294 lb 5 oz) 128.5 kg (283 lb 4.7 oz)   Weight change:   UOP: 1453 I/O since admission: +5.9L Last BM charted: 1/22 Foley:  Telemetry: ST with PVCs Status: ICU  Labs:  CBG stable  Creatinine stable, 1.47  AST 77, ALT 64, t bili 1.2  WBC 27.1 >> 30.3  Hgb 10.4 >> 9.7 >> 9.0  Calcium 6.6 >> 6.8  Imaging studies:  CT abd/pelvis 1/22: severe pancreatitis, inflamm changes, no evidence of infection; moderate hepatic steatosis   Scheduled Meds: . chlorhexidine  15 mL Mouth Rinse BID  . feeding supplement (PRO-STAT SUGAR FREE 64)  60 mL Per Tube TID  . feeding supplement (VITAL HIGH PROTEIN)  1,000 mL Per Tube Q24H  . fentaNYL  50 mcg Transdermal Q72H  . insulin aspart  0-9 Units Subcutaneous Q4H  . mouth rinse  15 mL Mouth Rinse q12n4p  . pantoprazole (PROTONIX) IV  40 mg Intravenous Q12H   Continuous Infusions: . sodium chloride    . lactated ringers 10 mL/hr at 11/12/17 0800  . meropenem (MERREM) IV Stopped (11/11/17 2253)    Principal  Problem:   Gallstone pancreatitis Active Problems:   Hypocalcemia   Pancreatic necrosis   AKI (acute kidney injury) (HCC)   Anemia   Obesity, Class III, BMI 40-49.9 (morbid obesity) (HCC)   LOS: 9 days

## 2017-11-12 NOTE — Progress Notes (Signed)
Pharmacy Antibiotic Note  Jannet MantisJoyce Tamas is a 57 y.o. female admitted on 2018-08-07 with acute pancreatitis, pancreatic edema and peripancreatic fluid,  possible pancreatic necrosis.  Pharmacy consulted for Zosyn dosing.  Today, 11/12/2017: Day #10 antibiotics Zosyn changed to Meropenem yesterday for worsening leukocytosis CRRT stopped today so dose will need adjustment SCr 1.47, CrCl ~62 ml/min  Plan: Meropenem 1g IV q8h since CRRT stopped Follow up renal fxn, culture results, and clinical course.   Height: 5\' 9"  (175.3 cm) Weight: 283 lb 4.7 oz (128.5 kg) IBW/kg (Calculated) : 66.2  Temp (24hrs), Avg:99.1 F (37.3 C), Min:97.7 F (36.5 C), Max:100.3 F (37.9 C)  Recent Labs  Lab 11/08/17 0601  11/09/17 0414  11/10/17 0451 11/10/17 1653 11/11/17 0540 11/11/17 1618 11/12/17 0417  WBC 11.6*  --  16.5*  --  23.6*  --  27.1*  --  30.3*  CREATININE 3.75*   < > 2.68*   < > 1.46* 1.67* 1.72* 1.57* 1.47*   < > = values in this interval not displayed.    Estimated Creatinine Clearance: 62.2 mL/min (A) (by C-G formula based on SCr of 1.47 mg/dL (H)).    Allergies  Allergen Reactions  . Demerol Other (See Comments)    Severe hypotension  . Meperidine     Other reaction(s): Hypotension, Hypotension (ALLERGY/intolerance)  . Other Anaphylaxis    Pineapple  . Pineapple Swelling and Anaphylaxis    Antimicrobials this admission: 1/14 Zosyn >> 1/22 1/22 Meropenem >>  Dose adjustments this admission: 1/18 adjust zosyn to 3.375g q6h for 30 min infusions with start of CVVHDF 1/23 Meropenem 1g q12h > q8h since CRRT stopped  Microbiology results: 1/14 MRSA PCR: negative 1/15 Surgical PCR: neg/neg 1/22 BCx: ngtd  Thank you for allowing pharmacy to be a part of this patient's care.  Loralee PacasErin Brynlyn Dade, PharmD, BCPS Pager: [phone] 11/12/2017 11:15 AM

## 2017-11-12 NOTE — Progress Notes (Signed)
Subjective: Interval History: 600 UOP yest, low CVP of 2 this am   Objective: Vital signs in last 24 hours: Temp:  [97.7 F (36.5 C)-100.3 F (37.9 C)] 97.7 F (36.5 C) (01/23 0800) Pulse Rate:  [95-112] 104 (01/23 0900) Resp:  [25-44] 30 (01/23 0900) BP: (107-151)/(29-101) 126/101 (01/23 0900) SpO2:  [93 %-100 %] 93 % (01/23 0900) Weight:  [128.5 kg (283 lb 4.7 oz)] 128.5 kg (283 lb 4.7 oz) (01/23 0500) Weight change:   Intake/Output from previous day: 01/22 0701 - 01/23 0700 In: 532.5 [I.V.:222.5; NG/GT:50; IV Piggyback:200] Out: 1453 [Urine:665] Intake/Output this shift: Total I/O In: 10 [I.V.:10] Out: 28 [Urine:18; Other:10]  Gen: awake, disoriented but more engaging today Neck: no jvd Resp: clear bilat Cardio: S1, S2 normal GI: distended does have some bs,  Extremities: edema 1+  Lab Results: Recent Labs    11/11/17 0540 11/12/17 0417  WBC 27.1* 30.3*  HGB 9.7* 9.0*  HCT 29.5* 27.5*  PLT 320 314   BMET:  Recent Labs    11/11/17 1618 11/12/17 0417  NA 138 140  K 4.0 4.4  CL 103 104  CO2 22 25  GLUCOSE 148* 140*  BUN 32* 31*  CREATININE 1.57* 1.47*  CALCIUM 6.6* 6.8*   No results for input(s): PTH in the last 72 hours. Iron Studies: No results for input(s): IRON, TIBC, TRANSFERRIN, FERRITIN in the last 72 hours.  CXR - bibasilar atx, LLL density/ infiltrate  I have reviewed the patient's current medications.  Assessment: 1 AKI - nonoliguric, getting CRRT, UOP improving. Will hold CRRT for 1-2 days, give IVF"s and see if there is recovery.  2 Resp failure - resolved  3 Obesity. 4 Gallstone necrotizing pancreatitis  5 Hypocalcemia some better.  P - as above   LOS: 9 days   Maree Krabbeobert D Ianmichael Amescua 11/12/2017,9:53 AM

## 2017-11-13 ENCOUNTER — Inpatient Hospital Stay (HOSPITAL_COMMUNITY): Payer: BLUE CROSS/BLUE SHIELD

## 2017-11-13 LAB — RENAL FUNCTION PANEL
Albumin: 1.9 g/dL — ABNORMAL LOW (ref 3.5–5.0)
Anion gap: 10 (ref 5–15)
BUN: 53 mg/dL — AB (ref 6–20)
CHLORIDE: 110 mmol/L (ref 101–111)
CO2: 24 mmol/L (ref 22–32)
CREATININE: 2.25 mg/dL — AB (ref 0.44–1.00)
Calcium: 6.7 mg/dL — ABNORMAL LOW (ref 8.9–10.3)
GFR calc Af Amer: 27 mL/min — ABNORMAL LOW (ref >60)
GFR calc non Af Amer: 23 mL/min — ABNORMAL LOW (ref >60)
Glucose, Bld: 144 mg/dL — ABNORMAL HIGH (ref 65–99)
Phosphorus: 3.7 mg/dL (ref 2.5–4.6)
Potassium: 4.1 mmol/L (ref 3.5–5.1)
Sodium: 144 mmol/L (ref 135–145)

## 2017-11-13 LAB — BASIC METABOLIC PANEL
ANION GAP: 9 (ref 5–15)
BUN: 53 mg/dL — ABNORMAL HIGH (ref 6–20)
CALCIUM: 6.6 mg/dL — AB (ref 8.9–10.3)
CO2: 25 mmol/L (ref 22–32)
CREATININE: 2.32 mg/dL — AB (ref 0.44–1.00)
Chloride: 111 mmol/L (ref 101–111)
GFR, EST AFRICAN AMERICAN: 26 mL/min — AB (ref >60)
GFR, EST NON AFRICAN AMERICAN: 23 mL/min — AB (ref >60)
GLUCOSE: 138 mg/dL — AB (ref 65–99)
Potassium: 4.1 mmol/L (ref 3.5–5.1)
Sodium: 145 mmol/L (ref 135–145)

## 2017-11-13 LAB — POCT ACTIVATED CLOTTING TIME
ACTIVATED CLOTTING TIME: 164 s
ACTIVATED CLOTTING TIME: 164 s
ACTIVATED CLOTTING TIME: 169 s
ACTIVATED CLOTTING TIME: 169 s
ACTIVATED CLOTTING TIME: 169 s
ACTIVATED CLOTTING TIME: 175 s
ACTIVATED CLOTTING TIME: 175 s
Activated Clotting Time: 169 seconds
Activated Clotting Time: 175 seconds
Activated Clotting Time: 175 seconds
Activated Clotting Time: 169 seconds
Activated Clotting Time: 175 seconds
Activated Clotting Time: 169 seconds

## 2017-11-13 LAB — GLUCOSE, CAPILLARY
GLUCOSE-CAPILLARY: 139 mg/dL — AB (ref 65–99)
GLUCOSE-CAPILLARY: 133 mg/dL — AB (ref 65–99)
GLUCOSE-CAPILLARY: 166 mg/dL — AB (ref 65–99)
Glucose-Capillary: 146 mg/dL — ABNORMAL HIGH (ref 65–99)
Glucose-Capillary: 125 mg/dL — ABNORMAL HIGH (ref 65–99)
Glucose-Capillary: 151 mg/dL — ABNORMAL HIGH (ref 65–99)

## 2017-11-13 LAB — CBC
HCT: 25.8 % — ABNORMAL LOW (ref 36.0–46.0)
Hemoglobin: 8.3 g/dL — ABNORMAL LOW (ref 12.0–15.0)
MCH: 29.7 pg (ref 26.0–34.0)
MCHC: 32.2 g/dL (ref 30.0–36.0)
MCV: 92.5 fL (ref 78.0–100.0)
PLATELETS: 328 10*3/uL (ref 150–400)
RBC: 2.79 MIL/uL — AB (ref 3.87–5.11)
RDW: 16.9 % — ABNORMAL HIGH (ref 11.5–15.5)
WBC: 25 10*3/uL — ABNORMAL HIGH (ref 4.0–10.5)

## 2017-11-13 LAB — LIPASE, BLOOD: LIPASE: 54 U/L — AB (ref 11–51)

## 2017-11-13 MED ORDER — SODIUM CHLORIDE 0.9% FLUSH
10.0000 mL | Freq: Two times a day (BID) | INTRAVENOUS | Status: DC
  Administered 2017-11-13 – 2017-11-25 (×17): 10 mL
  Administered 2017-11-25: 20 mL
  Administered 2017-11-25: 10 mL
  Administered 2017-11-26: 20 mL
  Administered 2017-11-26 – 2017-11-27 (×2): 10 mL
  Administered 2017-11-27: 20 mL
  Administered 2017-11-28 – 2017-11-29 (×3): 10 mL
  Administered 2017-11-30: 30 mL
  Administered 2017-11-30 – 2017-12-01 (×2): 10 mL

## 2017-11-13 MED ORDER — SODIUM CHLORIDE 0.9 % IV SOLN: INTRAVENOUS | Status: DC

## 2017-11-13 MED ORDER — SODIUM CHLORIDE 0.9% FLUSH: 10.0000 mL | INTRAVENOUS | Status: DC | PRN

## 2017-11-13 MED ORDER — LORAZEPAM 2 MG/ML IJ SOLN
0.5000 mg | Freq: Once | INTRAMUSCULAR | Status: AC
  Administered 2017-11-13: 0.5 mg via INTRAVENOUS
  Filled 2017-11-13: qty 1

## 2017-11-13 MED ORDER — SODIUM CHLORIDE 0.45 % IV SOLN
INTRAVENOUS | Status: DC
  Administered 2017-11-13 – 2017-11-14 (×2): via INTRAVENOUS

## 2017-11-13 MED ORDER — CHLORHEXIDINE GLUCONATE CLOTH 2 % EX PADS
6.0000 | MEDICATED_PAD | Freq: Every day | CUTANEOUS | Status: DC
  Administered 2017-11-13 – 2017-11-15 (×2): 6 via TOPICAL

## 2017-11-13 MED ORDER — TRACE MINERALS CR-CU-MN-SE-ZN 10-1000-500-60 MCG/ML IV SOLN
INTRAVENOUS | Status: AC
  Administered 2017-11-13: 17:00:00 via INTRAVENOUS
  Filled 2017-11-13 (×2): qty 960

## 2017-11-13 MED ORDER — CALCIUM GLUCONATE 10 % IV SOLN
1.0000 g | Freq: Once | INTRAVENOUS | Status: AC
  Administered 2017-11-13: 1 g via INTRAVENOUS
  Filled 2017-11-13: qty 10

## 2017-11-13 NOTE — Progress Notes (Signed)
[facility] Progress Note  Chief Complaint:    pancreatitis  Subjective: Her pain is better today but has been vomiting again.    ASSESSMENT / PLAN:   1. 57 yo female with severe necrotizing gallstone pancreatitis. CBD may have passed. Noncontrast CT scan two days ago showed worsening pancreatitis but nothing to suggest infection. She appears to be improving. Lipase 54, afebrile and WBC down from 30 to 25 on Merrem. She has been vomiting again and now connected to NGT suction. TNA has been initiated. Getting ice chips -will recheck LFTs in am. They have been trending down. If they rise again then she may need another MRCP as we haven't excluded retained bile duct stone at this point -continue supportive care.   2. ARF, off CRRT. Cr slightly worse 2.25 >>> 2.32 but good urine output. Already 500 ml today    Attending physician's note   I have taken an interval history, reviewed the chart and examined the patient. I agree with the Advanced Practitioner's note, impression and recommendations.   Claudette HeadMalcolm Keanen Dohse, MD Clement[facility] GrahamFACG [phone] Mon-Fri 8a-5p [phone] after 5p, weekends, holidays   Objective:  Vital signs in last 24 hours: Temp:  [97.5 F (36.4 C)-98.9 F (37.2 C)] 97.7 F (36.5 C) (01/24 1140) Pulse Rate:  [102-120] 102 (01/24 1200) Resp:  [27-48] 38 (01/24 1200) BP: (111-164)/(54-85) 138/71 (01/24 1200) SpO2:  [89 %-95 %] 95 % (01/24 1200) Weight:  [284 lb 9.8 oz (129.1 kg)-288 lb 12.8 oz (131 kg)] 284 lb 9.8 oz (129.1 kg) (01/24 0949) Last BM Date: 11/12/17 General:   Alert, obese white female in NAD EENT:  Normal hearing, non icteric sclera, conjunctive pink.  Heart:  Tachycardic , regular rhythm; no murmurs. No lower extremity edema Pulm: Normal respiratory effort, lungs CTA bilaterally without wheezes or crackles. Abdomen:  Soft, nondistended, nontender.  Normal bowel sounds, no masses felt. No hepatomegaly.    Neurologic:  Alert and  oriented x4;   grossly normal neurologically. Psych:  Pleasant, cooperative.  Normal mood and affect.   Intake/Output from previous day: 01/23 0701 - 01/24 0700 In: 2111.3 [I.V.:1811.3; IV Piggyback:300] Out: 868 [Urine:858] Intake/Output this shift: Total I/O In: 515 [I.V.:375; NG/GT:30; IV Piggyback:110] Out: 535 [Urine:535]  Lab Results: Recent Labs    11/11/17 0540 11/12/17 0417 11/13/17 0443  WBC 27.1* 30.3* 25.0*  HGB 9.7* 9.0* 8.3*  HCT 29.5* 27.5* 25.8*  PLT 320 314 328   BMET Recent Labs    11/12/17 0417 11/13/17 0443 11/13/17 1110  NA 140 144 145  K 4.4 4.1 4.1  CL 104 110 111  CO2 25 24 25   GLUCOSE 140* 144* 138*  BUN 31* 53* 53*  CREATININE 1.47* 2.25* 2.32*  CALCIUM 6.8* 6.7* 6.6*   LFT Recent Labs    11/12/17 0417 11/13/17 0443  PROT 5.4*  --   ALBUMIN 1.9* 1.9*  AST 77*  --   ALT 64*  --   ALKPHOS 125  --   BILITOT 1.2  --     Ct Abdomen Pelvis Wo Contrast  Result Date: 11/11/2017 CLINICAL DATA:  57 year old female with a history of necrotizing pancreatitis now with worsening leukocytosis. Evaluate for evidence of superinfection. EXAM: CT ABDOMEN AND PELVIS WITHOUT CONTRAST TECHNIQUE: Multidetector CT imaging of the abdomen and pelvis was performed following the standard protocol without IV contrast. COMPARISON:  Prior CT scan of the abdomen and pelvis 04-10-18 FINDINGS: Lower chest: Small bilateral pleural effusions and associated lower lobe atelectasis. The  intracardiac blood pool is hypodense relative to the adjacent myocardium consistent with anemia. No pericardial effusion. Unremarkable distal thoracic esophagus. Hepatobiliary: Severe hypoattenuation of the hepatic parenchyma consistent with hepatic steatosis. There is some sparing around the gallbladder fossa. High attenuation material within the gallbladder consistent with sludge and/or small stones. No intra or extrahepatic biliary ductal dilatation. Pancreas: The pancreas is diffusely edematous. No  discrete mass or pseudocyst. Inflammatory stranding extends throughout the peripancreatic soft tissues and into the mesenteric root. The degree of peripancreatic inflammatory stranding has significantly progressed compared to Nov 16, 2017. Evaluation is limited in the absence of intravenous contrast. Spleen: Normal in size without focal abnormality. Adrenals/Urinary Tract: Adrenal glands are unremarkable. Kidneys are normal, without renal calculi, focal lesion, or hydronephrosis. Foley catheter in the bladder. Stomach/Bowel: No evidence of obstruction or focal bowel wall thickening. Normal appendix in the right lower quadrant. The terminal ileum is unremarkable. Vascular/Lymphatic: Limited evaluation in the absence of intravenous contrast. No aneurysm, significant atherosclerotic calcifications or suspicious lymphadenopathy. Reproductive: Uterus and bilateral adnexa are unremarkable. Other: Progressive inflammatory stranding throughout the mesenteric root and peripancreatic soft tissues. Small volume ascites along the greater curvature of the stomach, in the left perisplenic space and extending inferiorly along the left greater than right colic gutters to layer in the anatomic pelvis. No loculation or intra fluid gas to suggest infection. Musculoskeletal: No acute fracture or aggressive appearing lytic or blastic osseous lesion. IMPRESSION: 1. Progressive severe pancreatitis and secondary inflammatory changes throughout the peripancreatic soft tissues and mesenteric root. There is now small volume ascites which is likely reactive in nature. No loculation or intra fluid gas to suggest infection. 2. Small bilateral pleural effusions and associated atelectasis. 3. The intracardiac blood pool is hypodense relative to the adjacent myocardium consistent with anemia. 4. At least moderate hepatic steatosis. 5. Cholelithiasis. Electronically Signed   By: Malachy Moan M.D.   On: 11/11/2017 14:57     Principal  Problem:   Gallstone pancreatitis Active Problems:   Hypocalcemia   Pancreatic necrosis   AKI (acute kidney injury) (HCC)   Anemia   Obesity, Class III, BMI 40-49.9 (morbid obesity) (HCC)    LOS: 10 days   Willette Cluster ,NP 11/13/2017, 1:20 PM  Pager number [phone]

## 2017-11-13 NOTE — Progress Notes (Signed)
Pt pulled NG out and is refusing to have it put back at this time. Explained to pt the need and how it was helping. She was complaining of nausea and had pulled blankets off. I replaced the blanket with a sheet as her friend stated she normally was "always HOT". I gave PRNs for pain, agitation and nausea. Her friend was at bedside giving comfort.

## 2017-11-13 NOTE — Progress Notes (Signed)
Subjective: Interval History: 860 UOP yest, pt stable, creat 2.5  Objective: Vital signs in last 24 hours: Temp:  [97.5 F (36.4 C)-98.9 F (37.2 C)] 97.7 F (36.5 C) (01/24 1140) Pulse Rate:  [102-120] 114 (01/24 1400) Resp:  [27-48] 41 (01/24 1400) BP: (111-164)/(54-85) 142/75 (01/24 1400) SpO2:  [89 %-95 %] 95 % (01/24 1400) Weight:  [129.1 kg (284 lb 9.8 oz)-131 kg (288 lb 12.8 oz)] 129.1 kg (284 lb 9.8 oz) (01/24 0949) Weight change: -2.5 kg (-8.2 oz)  Intake/Output from previous day: 01/23 0701 - 01/24 0700 In: 2111.3 [I.V.:1811.3; IV Piggyback:300] Out: 868 [Urine:858] Intake/Output this shift: Total I/O In: 841.5 [I.V.:521.5; NG/GT:110; IV Piggyback:210] Out: 1185 [Urine:685; Emesis/NG output:500]  Gen: awake, more alert and interactive Neck: no jvd Resp: clear bilat Cardio: S1, S2 normal GI: distended does have some bs,  Extremities: edema 1+  Lab Results: Recent Labs    11/12/17 0417 11/13/17 0443  WBC 30.3* 25.0*  HGB 9.0* 8.3*  HCT 27.5* 25.8*  PLT 314 328   BMET:  Recent Labs    11/13/17 0443 11/13/17 1110  NA 144 145  K 4.1 4.1  CL 110 111  CO2 24 25  GLUCOSE 144* 138*  BUN 53* 53*  CREATININE 2.25* 2.32*  CALCIUM 6.7* 6.6*   No results for input(s): PTH in the last 72 hours. Iron Studies: No results for input(s): IRON, TIBC, TRANSFERRIN, FERRITIN in the last 72 hours.  CXR - bibasilar atx, LLL density/ infiltrate  I have reviewed the patient's current medications.  Assessment: 1 AKI - nonoliguric, sp CRRT.  Making urine still and hopefully is recovering function. Get bmet this afternoon and in am.   2 Vol / HTN - bp's good, getting IVF's . To start TNA today, will cut IVF to 40 / hr, change to half NS due to rising serum Na 3 Obesity. 4 Gallstone necrotizing pancreatitis  5 Hypocalcemia some better.  P - as above   LOS: 10 days   Maree KrabbeRobert D Meryl Ponder 11/13/2017,2:12 PM

## 2017-11-13 NOTE — Progress Notes (Signed)
[facility] Progress Note     Subjective: CC: severe pancreatitis Pain greatly improved. Patient having more nausea and vomiting, cortrak removed. Emesis is bilious. Patient asking for ice chips and gingerale. Having formed stools.  Making more urine. VSS.   Objective: Vital signs in last 24 hours: Temp:  [97.5 F (36.4 C)-98.9 F (37.2 C)] 98.9 F (37.2 C) (01/24 0720) Pulse Rate:  [105-120] 114 (01/24 0900) Resp:  [27-48] 30 (01/24 0900) BP: (111-164)/(54-84) 130/66 (01/24 0900) SpO2:  [89 %-95 %] 92 % (01/24 0900) Weight:  [131 kg (288 lb 12.8 oz)] 131 kg (288 lb 12.8 oz) (01/24 0433) Last BM Date: 11/12/17  Intake/Output from previous day: 01/23 0701 - 01/24 0700 In: 2111.3 [I.V.:1811.3; IV Piggyback:300] Out: 868 [Urine:858] Intake/Output this shift: Total I/O In: 75 [I.V.:75] Out: 225 [Urine:225]  PE: ZOX:WRUEAVWGen:Resting, NAD Card: sinus tachycardia, pedal pulses 2+ BL Pulm:tachypneic, clear to auscultation bilaterally Abd: Soft,diffuselytender, not distended, bowel soundshypoactve, no HSM Skin: warm and dry, no rashes   Lab Results:  Recent Labs    11/12/17 0417 11/13/17 0443  WBC 30.3* 25.0*  HGB 9.0* 8.3*  HCT 27.5* 25.8*  PLT 314 328   BMET Recent Labs    11/12/17 0417 11/13/17 0443  NA 140 144  K 4.4 4.1  CL 104 110  CO2 25 24  GLUCOSE 140* 144*  BUN 31* 53*  CREATININE 1.47* 2.25*  CALCIUM 6.8* 6.7*   PT/INR No results for input(s): LABPROT, INR in the last 72 hours. CMP     Component Value Date/Time   NA 144 11/13/2017 0443   K 4.1 11/13/2017 0443   CL 110 11/13/2017 0443   CO2 24 11/13/2017 0443   GLUCOSE 144 (H) 11/13/2017 0443   BUN 53 (H) 11/13/2017 0443   CREATININE 2.25 (H) 11/13/2017 0443   CALCIUM 6.7 (L) 11/13/2017 0443   PROT 5.4 (L) 11/12/2017 0417   ALBUMIN 1.9 (L) 11/13/2017 0443   AST 77 (H) 11/12/2017 0417   ALT 64 (H) 11/12/2017 0417   ALKPHOS 125 11/12/2017 0417   BILITOT 1.2 11/12/2017 0417    GFRNONAA 23 (L) 11/13/2017 0443   GFRAA 27 (L) 11/13/2017 0443   Lipase     Component Value Date/Time   LIPASE 22 11/10/2017 1051       Studies/Results: Ct Abdomen Pelvis Wo Contrast  Result Date: 11/11/2017 CLINICAL DATA:  57 year old female with a history of necrotizing pancreatitis now with worsening leukocytosis. Evaluate for evidence of superinfection. EXAM: CT ABDOMEN AND PELVIS WITHOUT CONTRAST TECHNIQUE: Multidetector CT imaging of the abdomen and pelvis was performed following the standard protocol without IV contrast. COMPARISON:  Prior CT scan of the abdomen and pelvis 10/25/2017 FINDINGS: Lower chest: Small bilateral pleural effusions and associated lower lobe atelectasis. The intracardiac blood pool is hypodense relative to the adjacent myocardium consistent with anemia. No pericardial effusion. Unremarkable distal thoracic esophagus. Hepatobiliary: Severe hypoattenuation of the hepatic parenchyma consistent with hepatic steatosis. There is some sparing around the gallbladder fossa. High attenuation material within the gallbladder consistent with sludge and/or small stones. No intra or extrahepatic biliary ductal dilatation. Pancreas: The pancreas is diffusely edematous. No discrete mass or pseudocyst. Inflammatory stranding extends throughout the peripancreatic soft tissues and into the mesenteric root. The degree of peripancreatic inflammatory stranding has significantly progressed compared to 11/15/2017. Evaluation is limited in the absence of intravenous contrast. Spleen: Normal in size without focal abnormality. Adrenals/Urinary Tract: Adrenal glands are unremarkable. Kidneys are normal, without renal calculi, focal lesion,  or hydronephrosis. Foley catheter in the bladder. Stomach/Bowel: No evidence of obstruction or focal bowel wall thickening. Normal appendix in the right lower quadrant. The terminal ileum is unremarkable. Vascular/Lymphatic: Limited evaluation in the absence of  intravenous contrast. No aneurysm, significant atherosclerotic calcifications or suspicious lymphadenopathy. Reproductive: Uterus and bilateral adnexa are unremarkable. Other: Progressive inflammatory stranding throughout the mesenteric root and peripancreatic soft tissues. Small volume ascites along the greater curvature of the stomach, in the left perisplenic space and extending inferiorly along the left greater than right colic gutters to layer in the anatomic pelvis. No loculation or intra fluid gas to suggest infection. Musculoskeletal: No acute fracture or aggressive appearing lytic or blastic osseous lesion. IMPRESSION: 1. Progressive severe pancreatitis and secondary inflammatory changes throughout the peripancreatic soft tissues and mesenteric root. There is now small volume ascites which is likely reactive in nature. No loculation or intra fluid gas to suggest infection. 2. Small bilateral pleural effusions and associated atelectasis. 3. The intracardiac blood pool is hypodense relative to the adjacent myocardium consistent with anemia. 4. At least moderate hepatic steatosis. 5. Cholelithiasis. Electronically Signed   By: Malachy Moan M.D.   On: 11/11/2017 14:57    Anti-infectives: Anti-infectives (From admission, onward)   Start     Dose/Rate Route Frequency Ordered Stop   11/12/17 1800  meropenem (MERREM) 1 g in sodium chloride 0.9 % 100 mL IVPB     1 g 200 mL/hr over 30 Minutes Intravenous Every 8 hours 11/12/17 1115     11/11/17 1000  meropenem (MERREM) 1 g in sodium chloride 0.9 % 100 mL IVPB  Status:  Discontinued     1 g 200 mL/hr over 30 Minutes Intravenous Every 12 hours 11/11/17 0911 11/12/17 1115   11/09/17 1000  piperacillin-tazobactam (ZOSYN) IVPB 3.375 g  Status:  Discontinued     3.375 g 12.5 mL/hr over 240 Minutes Intravenous Every 8 hours 11/09/17 0614 11/09/17 0615   11/09/17 1000  piperacillin-tazobactam (ZOSYN) IVPB 3.375 g  Status:  Discontinued     3.375 g 100  mL/hr over 30 Minutes Intravenous Every 6 hours 11/09/17 0616 11/11/17 0911   11/07/17 2200  piperacillin-tazobactam (ZOSYN) IVPB 2.25 g  Status:  Discontinued     2.25 g 100 mL/hr over 30 Minutes Intravenous Every 8 hours 11/07/17 1218 11/07/17 1741   11/07/17 2200  piperacillin-tazobactam (ZOSYN) IVPB 3.375 g  Status:  Discontinued     3.375 g 12.5 mL/hr over 240 Minutes Intravenous Every 6 hours 11/07/17 1741 11/07/17 1747   11/07/17 2200  piperacillin-tazobactam (ZOSYN) IVPB 3.375 g  Status:  Discontinued     3.375 g 100 mL/hr over 30 Minutes Intravenous Every 6 hours 11/07/17 1747 11/09/17 0613   11/04/17 1100  piperacillin-tazobactam (ZOSYN) IVPB 3.375 g  Status:  Discontinued     3.375 g 12.5 mL/hr over 240 Minutes Intravenous Every 8 hours 11/04/17 1059 11/07/17 1218   11/22/17 1200  piperacillin-tazobactam (ZOSYN) IVPB 3.375 g     3.375 g 100 mL/hr over 30 Minutes Intravenous  Once 11-22-17 1157 11-22-17 1309       Assessment/Plan ARF - off CRRT with 858 cc UOP in 24h, Cr 2.25 VDRF - extubated, stable Obesity Nephrolithiasis  Gallstone pancreatitis - MRCP on 1/15 with concern for pancreatic necrosis  -repeat LFTs and lipase normalized; WBC 25, trending down today - no signs of peritonitis or indications for acute surgical intervention at this time. Surgery typically not indicated in the acute phase with pancreatic necrosis -repeat CT  1/22 showed severe pancreatitis without signs of infection  - place NGT to LIWS for n/v, consult pharmacy for TPN  FEN: NPO w/ ice chips for comfort, IVF; place NGT, start TPN VTE: SCDs, SQ heparin ID: IV zosyn 1/14>1/22; IV merrem 1/22>>    LOS: 10 days    Wells Guiles , [facility] 11/13/2017, 9:13 AM Pager: [phone] Consults: [phone] Mon-Fri 7:00 am-4:30 pm Sat-Sun 7:00 am-11:30 am

## 2017-11-13 NOTE — Progress Notes (Signed)
PHARMACY - ADULT TOTAL PARENTERAL NUTRITION CONSULT NOTE   Pharmacy Consult for TPN Indication: severe pancreatitis  Patient Measurements: Height: 5\' 9"  (175.3 cm) Weight: 288 lb 12.8 oz (131 kg) IBW/kg (Calculated) : 66.2 TPN AdjBW (KG): 83.2 Body mass index is 42.65 kg/m. Usual Weight:   Insulin Requirements: 8 units Novolog (sensitive scale)  Current Nutrition: NPO; TF stopped 1/22  IVF: NS at 67ml/hr  Central access: R IJ HD catheter TPN start date: 1/24  ASSESSMENT                                                                                                          HPI: 57 y.o. Female with gallstone pancreatitis with necrosis s/p MRCP on 1/15.  Repeat LFTs and lipase normalizing.  No plans for surgical intervention at this time.  Tube feeding was started but had to be stopped on 1/22 due to nausea/vomiting.  Pharmacy is consulted to begin TPN. Patient's course has been complicated with respiratory failure and intubation and subsequent extubation on 11/09/2017.  She was also started on CRRT from 1/18-1/23.  Significant events:   Today: 11/13/2017   Glucose: No hx DM. CBGs at goal of < 150  Electrolytes: Ca low 6.7 (corrected 8.38 with albumin 1.4), Mg elevated 2.5 yesterday  Renal: AKI - CRRT 1/18-1/23. SCr up 2.25 today, UOP 873cc yesterday  LFTs: AST/ALT elevated ~2x ULN, tbili and alkphos WNL  TGs: 213 (1/15), 332 (1/18)  Prealbumin: will check in AM  NUTRITIONAL GOALS                                                                                             RD recs 1/22: 2270-2405 kcal, >/= 135 grams protein per day Clinimix 5/15 at a goal rate of 159ml/hr will provide 132g/day protein, 1874Kcal/day. After 7 days, the addition of 20% fat emulsion at 25ml/hr to provide 132g/day protein, 2354Kcal/day.  Patient may not be able to tolerate this amount of fluid given current renal function.  PLAN  Now: calcium gluconate 1g IV x 1  At 1800 today:  Start Clinimix 5/15 (no electrolytes d/t elevated Mg/SCr) at 4140ml/hr.  Hold 20% lipid emulsion for first 7 days for ICU patients per ASPEN guidelines (Start date 11/20/17)  Plan to advance as tolerated to the goal rate.  TPN to contain standard multivitamins and trace elements.  Reduce IVF to 6335ml/hr.  Continue SSI q4h sensitive scale .   TPN lab panels on Mondays & Thursdays.  F/u daily.   Loralee PacasErin Amaree Leeper, PharmD, BCPS Pager: [phone] 11/13/2017,9:26 AM

## 2017-11-13 NOTE — Progress Notes (Signed)
PROGRESS NOTE  Emma Washington ZOX:[id number] DOB: Feb 06, 1961 DOA: 11-08-17 PCP: No primary care provider on file.  Assessment/Plan Progressive severe necrotizing gallstone pancreatitis with inflammatory changes as seen on CT abd/pelvis 1/22.  - continue conservative management, IV abx; add TPN per surgery - WBC decreasing, will continue IV abx for now - further recs per GI  AKI. - creatinine worse today off CRRT but nonoliguric - continue management per nephrology  Anemia of critical illness - trending down slowly - check fecal occult blood - CBC in AM 1/15  Hypocalcemia - remains stable. Corrected calcium is 8.4. Replacing today.  Acute hypoxic resp failure - wean oxygen as tolerated 1/24  S/p VDRF  Moderate hepatic steatosis with chronic elevation of transaminases  Morbid obesity  - per nutrition  Nutrition - TNA today   DVT prophylaxis: SCDs Code Status: full Family Communication: none Disposition Plan: pending    Brendia Sacks, MD  [facility] Direct contact: [phone] --Via amion app OR  --[email or web address]; password [email or web address]  7PM-7AM contact night coverage as above 11/13/2017, 8:56 AM  LOS: 10 days   Consultants:  General surgery  Procedures:    Antimicrobials:  Meropenem 1/23 >>  Zosyn 1/14 >> 1/22  Interval history/Subjective: Multiple episodes of vomiting overnight.  GI and surgery recommended continuing current management yesterday. Surgery starting TPN today.  Patient feels ok, no pain currently.  Objective: Vitals:  Vitals:   11/13/17 0720 11/13/17 0800  BP:  (!) 111/54  Pulse:  (!) 105  Resp:  (!) 30  Temp: 98.9 F (37.2 C)   SpO2:  94%    Exam:   Constitutional:   . Appears calm and comfortable ENMT:  . grossly normal hearing  . Lips appear normal Respiratory:  . CTA bilaterally, no w/r/r.  . Respiratory effort normal.  Cardiovascular:  . RRR, no m/r/g . No LE extremity edema   Abdomen:  . Soft,  ntnd Musculoskeletal:  . RUE, LUE, RLE, LLE   o strength and tone normal, no atrophy, no abnormal movements Skin:  . No rashes, lesions, ulcers . palpation of skin: no induration or nodules Psychiatric:  . Mental status o Mood, affect appropriate  I have personally reviewed the following:  Filed Weights   11/11/17 0940 11/12/17 0500 11/13/17 0433  Weight: 133.5 kg (294 lb 5 oz) 128.5 kg (283 lb 4.7 oz) 131 kg (288 lb 12.8 oz)   Weight change: -2.5 kg (-8.2 oz)  UOP: 858 I/O since admission: +7.2L Last BM charted: 1/22 Foley: yes Telemetry:   Status: ICU  Labs:  CBG stable  Creatinine 1.47 >> 2.25 off CRRT  Albumin 1.9  WBC 27.1 >> 30.3 >> 25.0  Hgb 10.4 >> 9.7 >> 9.0 >> 8.3  Calcium 6.6 >> 6.8 >> 6.7 uncorrected, corrected 8.4  Imaging studies:     Scheduled Meds: . chlorhexidine  15 mL Mouth Rinse BID  . Chlorhexidine Gluconate Cloth  6 each Topical Daily  . feeding supplement (PRO-STAT SUGAR FREE 64)  60 mL Per Tube TID  . feeding supplement (VITAL HIGH PROTEIN)  1,000 mL Per Tube Q24H  . fentaNYL  50 mcg Transdermal Q72H  . insulin aspart  0-9 Units Subcutaneous Q4H  . mouth rinse  15 mL Mouth Rinse q12n4p  . pantoprazole (PROTONIX) IV  40 mg Intravenous Q12H  . sodium chloride flush  10-40 mL Intracatheter Q12H   Continuous Infusions: . sodium chloride 75 mL/hr at 11/13/17 0800  . lactated ringers Stopped (11/13/17 0700)  . meropenem (  MERREM) IV Stopped (11/13/17 [id number])    Principal Problem:   Gallstone pancreatitis Active Problems:   Hypocalcemia   Pancreatic necrosis   AKI (acute kidney injury) (HCC)   Anemia   Obesity, Class III, BMI 40-49.9 (morbid obesity) (HCC)   LOS: 10 days

## 2017-11-13 NOTE — Progress Notes (Signed)
Date: November 13, 2017 Patriece Archbold, BSN, RN3, CCM [phone] Chart and notes review for patient progress and needs. Will follow for case management and discharge needs. No cm or discharge needs present at time of this review. Next review date: [id number] 

## 2017-11-13 NOTE — Progress Notes (Signed)
Nutrition Follow-up  DOCUMENTATION CODES:   Morbid obesity  INTERVENTION:  - TPN initiation/advancement per Pharmacy. - Will monitor for ability to transition to TF versus diet advancement in the future.   NUTRITION DIAGNOSIS:   Inadequate oral intake related to inability to eat as evidenced by NPO status. 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 Other associated symptoms include nausea, vomiting and decreased PO intake (since 1/13). Pertinent PMHx significant for nephrolithiasis.   Significant Events: 1/15- MRCP and CXR 1/18- intubated around 3:00 AM and plan for OGT placement and trickle TF initiation 1/19- CRRT started 1/20- extubated and OGT removed 1/21- Panda placed in R nare 1/22- TF stopped at 5:00 AM d/t N/V 1/23- plan to hold CRRT x1-2 days and assess for renal recovery 1/24- TPN initiation; ILE to be held x7 days   Pt continues with N/V. Per RN and review of the chart, panda tube removed and NGT placed to LIS. Abd x-ray completed to verify placement; awaiting results of this. Weight trending down and now below admission weight (-8 lbs/3.3 kg) compared to admission weight. Estimated nutrition needs remain appropriate.   Per Pharmacy note this AM, plan for Clinimix 5/15 @ 40 mL/hr to start tonight. This will provide 48 grams of protein and 682 kcal. ILE to be held x7 days per ICU protocol. Planned goal rate of Clinimix 5/15 @ 100 mL/hr which will provide 132 grams of protein and 1874 kcal. Once ILE able to be added (20% at 20 mL/hr x12 hours), will provide an additional 480 kcal/day.    Medications reviewed; 1 g Ca gluconate x1 today, sliding scale  Novolog, 40 mg IV Protonix BID.  Labs reviewed; CBGs: 146 and 139 mg/dL this AM, BUN: 53 mg/dL, creatinine: 7.822.25 mg/dL, Ca: 6.7 mg/dL, GFR: 23 mL/min.   IVF: NS @ 75 mL/hr.       Diet Order:  Diet NPO time specified Except for: Ice Chips TPN (CLINIMIX) Adult without lytes  EDUCATION NEEDS:   No education needs have been identified at this time  Skin:  Skin Assessment: Reviewed RN Assessment  Last BM:  1/23  Height:   Ht Readings from Last 1 Encounters:  11/07/17 5\' 9"  (1.753 m)    Weight:   Wt Readings from Last 1 Encounters:  11/13/17 288 lb 12.8 oz (131 kg)    Ideal Body Weight:  65.9 kg  BMI:  Body mass index is 42.65 kg/m.  Estimated Nutritional Needs:   Kcal:  [id number]   Protein:  >/= 135 grams  Fluid:  >/= 1.8 L/day      Trenton GammonJessica June Rode, MS, RD, LDN, [facility] Inpatient Clinical Dietitian Pager # [phone] After hours/weekend pager # [phone]

## 2017-11-13 NOTE — Progress Notes (Signed)
During 12 hour shift, patient has vomited multiple times bile colored emesis even after alternating IV zofran and phenergan. She had very short periods of rest. Gave 0.5mg  IV ativan for sleep and anxiety with minimal relief. Continue to monitor.

## 2017-11-14 LAB — RENAL FUNCTION PANEL
ALBUMIN: 1.8 g/dL — AB (ref 3.5–5.0)
Anion gap: 11 (ref 5–15)
BUN: 60 mg/dL — ABNORMAL HIGH (ref 6–20)
CALCIUM: 6.9 mg/dL — AB (ref 8.9–10.3)
CO2: 24 mmol/L (ref 22–32)
CREATININE: 2.27 mg/dL — AB (ref 0.44–1.00)
Chloride: 112 mmol/L — ABNORMAL HIGH (ref 101–111)
GFR, EST AFRICAN AMERICAN: 27 mL/min — AB (ref >60)
GFR, EST NON AFRICAN AMERICAN: 23 mL/min — AB (ref >60)
Glucose, Bld: 199 mg/dL — ABNORMAL HIGH (ref 65–99)
PHOSPHORUS: 4.3 mg/dL (ref 2.5–4.6)
Potassium: 4 mmol/L (ref 3.5–5.1)
Sodium: 147 mmol/L — ABNORMAL HIGH (ref 135–145)

## 2017-11-14 LAB — DIFFERENTIAL
BASOS ABS: 0.3 10*3/uL — AB (ref 0.0–0.1)
Basophils Relative: 1 %
Eosinophils Absolute: 0 10*3/uL (ref 0.0–0.7)
Eosinophils Relative: 0 %
LYMPHS PCT: 5 %
Lymphs Abs: 1.3 10*3/uL (ref 0.7–4.0)
MONOS PCT: 6 %
Monocytes Absolute: 1.5 10*3/uL — ABNORMAL HIGH (ref 0.1–1.0)
NEUTROS ABS: 21.9 10*3/uL — AB (ref 1.7–7.7)
Neutrophils Relative %: 88 %
nRBC: 2 /100 WBC — ABNORMAL HIGH

## 2017-11-14 LAB — HEPATIC FUNCTION PANEL
ALT: 50 U/L (ref 14–54)
AST: 57 U/L — ABNORMAL HIGH (ref 15–41)
Albumin: 1.9 g/dL — ABNORMAL LOW (ref 3.5–5.0)
Alkaline Phosphatase: 115 U/L (ref 38–126)
Bilirubin, Direct: 0.3 mg/dL (ref 0.1–0.5)
Indirect Bilirubin: 0.3 mg/dL (ref 0.3–0.9)
Total Bilirubin: 0.6 mg/dL (ref 0.3–1.2)
Total Protein: 5.4 g/dL — ABNORMAL LOW (ref 6.5–8.1)

## 2017-11-14 LAB — TRIGLYCERIDES: Triglycerides: 313 mg/dL — ABNORMAL HIGH

## 2017-11-14 LAB — GLUCOSE, CAPILLARY
Glucose-Capillary: 175 mg/dL — ABNORMAL HIGH (ref 65–99)
Glucose-Capillary: 193 mg/dL — ABNORMAL HIGH (ref 65–99)
Glucose-Capillary: 180 mg/dL — ABNORMAL HIGH (ref 65–99)
Glucose-Capillary: 207 mg/dL — ABNORMAL HIGH (ref 65–99)
Glucose-Capillary: 176 mg/dL — ABNORMAL HIGH (ref 65–99)
Glucose-Capillary: 181 mg/dL — ABNORMAL HIGH (ref 65–99)

## 2017-11-14 LAB — PHOSPHORUS: Phosphorus: 4.4 mg/dL (ref 2.5–4.6)

## 2017-11-14 LAB — CBC
HCT: 25.6 % — ABNORMAL LOW (ref 36.0–46.0)
Hemoglobin: 8.2 g/dL — ABNORMAL LOW (ref 12.0–15.0)
MCH: 30 pg (ref 26.0–34.0)
MCHC: 32 g/dL (ref 30.0–36.0)
MCV: 93.8 fL (ref 78.0–100.0)
Platelets: 403 10*3/uL — ABNORMAL HIGH (ref 150–400)
RBC: 2.73 MIL/uL — AB (ref 3.87–5.11)
RDW: 17.3 % — ABNORMAL HIGH (ref 11.5–15.5)
WBC: 25 10*3/uL — ABNORMAL HIGH (ref 4.0–10.5)

## 2017-11-14 LAB — PREALBUMIN: PREALBUMIN: 6.7 mg/dL — AB (ref 18–38)

## 2017-11-14 LAB — MAGNESIUM: MAGNESIUM: 2.7 mg/dL — AB (ref 1.7–2.4)

## 2017-11-14 MED ORDER — TRACE MINERALS CR-CU-MN-SE-ZN 10-1000-500-60 MCG/ML IV SOLN
INTRAVENOUS | Status: AC
  Administered 2017-11-14: 17:00:00 via INTRAVENOUS
  Filled 2017-11-14: qty 960

## 2017-11-14 MED ORDER — INSULIN ASPART 100 UNIT/ML ~~LOC~~ SOLN
0.0000 [IU] | SUBCUTANEOUS | Status: DC
  Administered 2017-11-14: 3 [IU] via SUBCUTANEOUS
  Administered 2017-11-14: 5 [IU] via SUBCUTANEOUS
  Administered 2017-11-14 – 2017-11-16 (×11): 3 [IU] via SUBCUTANEOUS
  Administered 2017-11-16 (×2): 2 [IU] via SUBCUTANEOUS
  Administered 2017-11-17 (×3): 3 [IU] via SUBCUTANEOUS
  Administered 2017-11-17 (×2): 2 [IU] via SUBCUTANEOUS
  Administered 2017-11-17 – 2017-11-18 (×3): 3 [IU] via SUBCUTANEOUS
  Administered 2017-11-18: 2 [IU] via SUBCUTANEOUS
  Administered 2017-11-18 – 2017-11-19 (×4): 3 [IU] via SUBCUTANEOUS
  Administered 2017-11-19: 2 [IU] via SUBCUTANEOUS
  Administered 2017-11-19: 3 [IU] via SUBCUTANEOUS
  Administered 2017-11-19 (×2): 2 [IU] via SUBCUTANEOUS
  Administered 2017-11-19 – 2017-11-20 (×3): 3 [IU] via SUBCUTANEOUS
  Administered 2017-11-20: 2 [IU] via SUBCUTANEOUS
  Administered 2017-11-20: 3 [IU] via SUBCUTANEOUS
  Administered 2017-11-20: 2 [IU] via SUBCUTANEOUS
  Administered 2017-11-20 – 2017-11-21 (×2): 3 [IU] via SUBCUTANEOUS
  Administered 2017-11-21 (×5): 2 [IU] via SUBCUTANEOUS
  Administered 2017-11-22: 3 [IU] via SUBCUTANEOUS
  Administered 2017-11-22 (×4): 2 [IU] via SUBCUTANEOUS
  Administered 2017-11-22: 3 [IU] via SUBCUTANEOUS
  Administered 2017-11-22 – 2017-11-23 (×2): 2 [IU] via SUBCUTANEOUS
  Administered 2017-11-23: 3 [IU] via SUBCUTANEOUS
  Administered 2017-11-23: 2 [IU] via SUBCUTANEOUS
  Administered 2017-11-23: 3 [IU] via SUBCUTANEOUS
  Administered 2017-11-23 – 2017-11-24 (×3): 2 [IU] via SUBCUTANEOUS
  Administered 2017-11-24: 3 [IU] via SUBCUTANEOUS
  Administered 2017-11-24: 2 [IU] via SUBCUTANEOUS
  Administered 2017-11-24: 3 [IU] via SUBCUTANEOUS
  Administered 2017-11-25 – 2017-11-26 (×7): 2 [IU] via SUBCUTANEOUS
  Administered 2017-11-26 (×2): 3 [IU] via SUBCUTANEOUS
  Administered 2017-11-26 (×2): 2 [IU] via SUBCUTANEOUS
  Administered 2017-11-26 (×2): 3 [IU] via SUBCUTANEOUS
  Administered 2017-11-27 – 2017-11-28 (×10): 2 [IU] via SUBCUTANEOUS
  Administered 2017-11-28: 3 [IU] via SUBCUTANEOUS
  Administered 2017-11-28 – 2017-11-30 (×9): 2 [IU] via SUBCUTANEOUS
  Administered 2017-11-30: 3 [IU] via SUBCUTANEOUS
  Administered 2017-12-01 – 2017-12-02 (×7): 2 [IU] via SUBCUTANEOUS
  Administered 2017-12-02: 3 [IU] via SUBCUTANEOUS
  Administered 2017-12-02: 2 [IU] via SUBCUTANEOUS
  Administered 2017-12-02: 3 [IU] via SUBCUTANEOUS
  Administered 2017-12-03 (×5): 2 [IU] via SUBCUTANEOUS
  Administered 2017-12-04: 3 [IU] via SUBCUTANEOUS
  Administered 2017-12-04 (×4): 2 [IU] via SUBCUTANEOUS
  Administered 2017-12-04: 3 [IU] via SUBCUTANEOUS
  Administered 2017-12-05 (×3): 2 [IU] via SUBCUTANEOUS

## 2017-11-14 MED ORDER — DEXTROSE 5 % IV SOLN
INTRAVENOUS | Status: DC
Start: 2017-11-14 — End: 2017-11-15
  Administered 2017-11-14: 15:00:00 via INTRAVENOUS

## 2017-11-14 MED ORDER — SODIUM CHLORIDE 0.9 % IV SOLN
1.0000 g | Freq: Once | INTRAVENOUS | Status: AC
  Administered 2017-11-14: 1 g via INTRAVENOUS
  Filled 2017-11-14: qty 10

## 2017-11-14 NOTE — Progress Notes (Signed)
     [facility] Progress Note   Chief Complaint:   pancreatitis   Subjective: "pretty good"   ASSESSMENT AND PLAN:   1. 57 yo female with severe necrotizing gallstone pancreatitis. She is no longer having the severe abdominal pain now. WBC still at 25K but afebrile. On Merrem. Getting TNA.  -LFTs pending. LFTs have been improving suggesting she passed the gallstone. \ -continue supportive care for now.   2. AKI, off CRRT. Cr about same as yesterday 2.27 <<<2.32.    [facility] Attending   I have taken an interval history, reviewed the chart and examined the patient. I agree with the Advanced Practitioner's note, impression and recommendations.  She is stable after severe gallstone pancreatitis - continue aggressive supportive care. We will continue to follow.  Iva Booparl E. Gessner, MD, [facility] [phone] (pager) 11/14/2017 11:28 AM   Objective:  Vital signs in last 24 hours: Temp:  [97.6 F (36.4 C)-99.2 F (37.3 C)] 97.6 F (36.4 C) (01/25 0757) Pulse Rate:  [102-119] 110 (01/25 1000) Resp:  [24-41] 24 (01/25 1000) BP: (113-161)/(38-80) 147/72 (01/25 1000) SpO2:  [87 %-95 %] 95 % (01/25 1000) Weight:  [296 lb 1.2 oz (134.3 kg)] 296 lb 1.2 oz (134.3 kg) (01/25 0500) Last BM Date: 11/12/17 General:   sleepy, obese white female in NAD EENT:  Normal hearing, non icteric sclera Heart:  Mild tachycardia, HR 107. Regular rhythm; no murmurs. No lower extremity edema Pulm: Normal respiratory effort. Abdomen:  Soft, mildly distended,  A few bowel sounds.     Neurologic:  Sleepy,  grossly normal neurologically.   Intake/Output from previous day: 01/24 0701 - 01/25 0700 In: 2307.2 [I.V.:1742.2; NG/GT:155; IV Piggyback:410] Out: 3060 [Urine:1910; Emesis/NG output:1150] Intake/Output this shift: Total I/O In: 240 [I.V.:240] Out: 315 [Urine:315]  Lab Results: Recent Labs    11/12/17 0417 11/13/17 0443 11/14/17 0626  WBC 30.3* 25.0*  25.0*  HGB 9.0* 8.3* 8.2*  HCT 27.5* 25.8* 25.6*  PLT 314 328 403*   BMET Recent Labs    11/13/17 0443 11/13/17 1110 11/14/17 0626  NA 144 145 147*  K 4.1 4.1 4.0  CL 110 111 112*  CO2 24 25 24   GLUCOSE 144* 138* 199*  BUN 53* 53* 60*  CREATININE 2.25* 2.32* 2.27*  CALCIUM 6.7* 6.6* 6.9*   LFT Recent Labs    11/12/17 0417  11/14/17 0626  PROT 5.4*  --   --   ALBUMIN 1.9*   < > 1.8*  AST 77*  --   --   ALT 64*  --   --   ALKPHOS 125  --   --   BILITOT 1.2  --   --    < > = values in this interval not displayed.   Dg Abd Portable 1v  Result Date: 11/13/2017 CLINICAL DATA:  NG tube placement. EXAM: PORTABLE ABDOMEN - 1 VIEW COMPARISON:  No recent prior. FINDINGS: NG tube noted with tip coiled stomach. No bowel distention noted. Bibasilar atelectasis. No acute bony abnormality noted. IMPRESSION: NG tube noted with tip coiled in the stomach. Electronically Signed   By: Maisie Fushomas  Register   On: 11/13/2017 10:07    LOS: 11 days   Willette ClusterPaula Guenther ,NP 11/14/2017, 10:15 AM  Pager number [phone]

## 2017-11-14 NOTE — Progress Notes (Signed)
PHARMACY - ADULT TOTAL PARENTERAL NUTRITION CONSULT NOTE   Pharmacy Consult for TPN Indication: severe pancreatitis  Patient Measurements: Height: 5\' 9"  (175.3 cm) Weight: 296 lb 1.2 oz (134.3 kg) IBW/kg (Calculated) : 66.2 TPN AdjBW (KG): 83.2 Body mass index is 43.72 kg/m.  Insulin Requirements: 8 units Novolog (sensitive scale)  Current Nutrition: NPO; TF stopped 1/22  IVF: NS at 6175ml/hr  Central access: R IJ HD catheter TPN start date: 1/24  ASSESSMENT                                                                                                          HPI: 57 y.o. Female with gallstone pancreatitis with necrosis s/p MRCP on 1/15.  Repeat LFTs and lipase normalizing.  No plans for surgical intervention at this time.  Tube feeding was started but had to be stopped on 1/22 due to nausea/vomiting.  Pharmacy is consulted to begin TPN. Patient's course has been complicated with respiratory failure and intubation and subsequent extubation on 11/09/2017.  She was also started on CRRT from 1/18-1/23.  Significant events:   Today: 11/14/2017   Glucose: No hx DM. CBGs elevated since starting TPN (161-096(166-193)  Electrolytes: CoCa low 8.66, Mg elevated 2.7, Na elevated at 147, K/Phos WNL  Renal: AKI - CRRT 1/18-1/23. SCr elevated but stable off CRRT, UOP 1920cc yesterday  LFTs: AST/ALT elevated ~2x ULN, tbili and alkphos WNL (1.23)  TGs: 213 (1/15), 332 (1/18)  Prealbumin: IP (1/25)  NUTRITIONAL GOALS                                                                                             RD recs 1/22: 2270-2405 kcal, >/= 135 grams protein per day Clinimix 5/15 at a goal rate of 14410ml/hr will provide 132g/day protein, 1874Kcal/day. After 7 days, the addition of 20% fat emulsion at 4320ml/hr to provide 132g/day protein, 2354Kcal/day.  **Patient may not be able to tolerate this amount of fluid given current renal function.  PLAN  Now: calcium gluconate 1g IV x 1  At 1800 today:  Continue Clinimix 5/15 (no electrolytes d/t elevated Mg/SCr) at 20ml/hr.  Hold 20% lipid emulsion for first 7 days for ICU patients per ASPEN guidelines (Start date 11/20/17)  Plan to advance as tolerated to the goal rate -->once CBGs improved  TPN to contain standard multivitamins and trace elements.  Reduce IVF to 43ml/hr.  Increase SSI q4h MODERATE scale .   TPN lab panels on Mondays & Thursdays.  F/u daily-->check Bmet in am  Junita Push, PharmD, BCPS Pager: [phone] 11/14/2017,8:08 AM

## 2017-11-14 NOTE — Progress Notes (Signed)
[facility] Progress Note     Subjective: CC: severe pancreatitis Patient states pain is about a 3-4/10 today. Reports some nausea, but has not thrown up since NGT came out. Does not want another NGT in right now. Wanting to know if she can have just some ice chips.  Objective: Vital signs in last 24 hours: Temp:  [97.7 F (36.5 C)-99.2 F (37.3 C)] 98.7 F (37.1 C) (01/25 0323) Pulse Rate:  [102-120] 103 (01/25 0600) Resp:  [25-41] 25 (01/25 0600) BP: (111-161)/(38-85) 132/71 (01/25 0600) SpO2:  [87 %-95 %] 95 % (01/25 0600) Weight:  [129.1 kg (284 lb 9.8 oz)-134.3 kg (296 lb 1.2 oz)] 134.3 kg (296 lb 1.2 oz) (01/25 0500) Last BM Date: 11/12/17  Intake/Output from previous day: 01/24 0701 - 01/25 0700 In: 2147.2 [I.V.:1582.2; NG/GT:155; IV Piggyback:410] Out: 3060 [Urine:1910; Emesis/NG output:1150] Intake/Output this shift: No intake/output data recorded.  PE: JYN:WGNFAOZ,HYQ Card:sinus tachycardia, pedal pulses 2+ BL Pulm:tachypneic, clear to auscultation bilaterally Abd: Soft,diffuselytender, not distended, bowel soundshypoactve, no HSM Skin: warm and dry, no rashes   Lab Results:  Recent Labs    11/13/17 0443 11/14/17 0626  WBC 25.0* 25.0*  HGB 8.3* 8.2*  HCT 25.8* 25.6*  PLT 328 403*   BMET Recent Labs    11/13/17 1110 11/14/17 0626  NA 145 147*  K 4.1 4.0  CL 111 112*  CO2 25 24  GLUCOSE 138* 199*  BUN 53* 60*  CREATININE 2.32* 2.27*  CALCIUM 6.6* 6.9*   PT/INR No results for input(s): LABPROT, INR in the last 72 hours. CMP     Component Value Date/Time   NA 147 (H) 11/14/2017 0626   K 4.0 11/14/2017 0626   CL 112 (H) 11/14/2017 0626   CO2 24 11/14/2017 0626   GLUCOSE 199 (H) 11/14/2017 0626   BUN 60 (H) 11/14/2017 0626   CREATININE 2.27 (H) 11/14/2017 0626   CALCIUM 6.9 (L) 11/14/2017 0626   PROT 5.4 (L) 11/12/2017 0417   ALBUMIN 1.8 (L) 11/14/2017 0626   AST 77 (H) 11/12/2017 0417   ALT 64 (H) 11/12/2017 0417    ALKPHOS 125 11/12/2017 0417   BILITOT 1.2 11/12/2017 0417   GFRNONAA 23 (L) 11/14/2017 0626   GFRAA 27 (L) 11/14/2017 0626   Lipase     Component Value Date/Time   LIPASE 54 (H) 11/13/2017 1110       Studies/Results: Dg Abd Portable 1v  Result Date: 11/13/2017 CLINICAL DATA:  NG tube placement. EXAM: PORTABLE ABDOMEN - 1 VIEW COMPARISON:  No recent prior. FINDINGS: NG tube noted with tip coiled stomach. No bowel distention noted. Bibasilar atelectasis. No acute bony abnormality noted. IMPRESSION: NG tube noted with tip coiled in the stomach. Electronically Signed   By: Maisie Fus  Register   On: 11/13/2017 10:07    Anti-infectives: Anti-infectives (From admission, onward)   Start     Dose/Rate Route Frequency Ordered Stop   11/12/17 1800  meropenem (MERREM) 1 g in sodium chloride 0.9 % 100 mL IVPB     1 g 200 mL/hr over 30 Minutes Intravenous Every 8 hours 11/12/17 1115     11/11/17 1000  meropenem (MERREM) 1 g in sodium chloride 0.9 % 100 mL IVPB  Status:  Discontinued     1 g 200 mL/hr over 30 Minutes Intravenous Every 12 hours 11/11/17 0911 11/12/17 1115   11/09/17 1000  piperacillin-tazobactam (ZOSYN) IVPB 3.375 g  Status:  Discontinued     3.375 g 12.5 mL/hr over 240 Minutes  Intravenous Every 8 hours 11/09/17 0614 11/09/17 0615   11/09/17 1000  piperacillin-tazobactam (ZOSYN) IVPB 3.375 g  Status:  Discontinued     3.375 g 100 mL/hr over 30 Minutes Intravenous Every 6 hours 11/09/17 0616 11/11/17 0911   11/07/17 2200  piperacillin-tazobactam (ZOSYN) IVPB 2.25 g  Status:  Discontinued     2.25 g 100 mL/hr over 30 Minutes Intravenous Every 8 hours 11/07/17 1218 11/07/17 1741   11/07/17 2200  piperacillin-tazobactam (ZOSYN) IVPB 3.375 g  Status:  Discontinued     3.375 g 12.5 mL/hr over 240 Minutes Intravenous Every 6 hours 11/07/17 1741 11/07/17 1747   11/07/17 2200  piperacillin-tazobactam (ZOSYN) IVPB 3.375 g  Status:  Discontinued     3.375 g 100 mL/hr over 30 Minutes  Intravenous Every 6 hours 11/07/17 1747 11/09/17 0613   11/04/17 1100  piperacillin-tazobactam (ZOSYN) IVPB 3.375 g  Status:  Discontinued     3.375 g 12.5 mL/hr over 240 Minutes Intravenous Every 8 hours 11/04/17 1059 11/07/17 1218   10/31/2017 1200  piperacillin-tazobactam (ZOSYN) IVPB 3.375 g     3.375 g 100 mL/hr over 30 Minutes Intravenous  Once 11/02/2017 1157 10/24/2017 1309       Assessment/Plan ARF - off CRRT with 1,910 cc UOP in 24h, Cr 2.27 VDRF - extubated, stable Obesity Nephrolithiasis  Gallstone pancreatitis - MRCP on 1/15 with concern for pancreatic necrosis  -WBC 25, stable - no signs of peritonitis or indications for acute surgical intervention at this time. Surgery typically not indicated in the acute phase with pancreatic necrosis -repeat CT 1/22 showed severe pancreatitis without signs of infection - patient had NGT placed yesterday and had 1,150 cc out but patient removed last night and refused to have replaced  FEN: NPO w/ ice chips for comfort, IVF; TPN VTE: SCDs, SQ heparin ID: IV zosyn 1/14>1/22; IV merrem 1/22>>   Continue current care. Would recommend PT/OT now that patient is no longer on CRRT.   LOS: 11 days    Wells GuilesKelly Rayburn , [facility] 11/14/2017, 7:42 AM Pager: [phone] Mon-Fri 7:00 am-4:30 pm Sat-Sun 7:00 am-11:30 am

## 2017-11-14 NOTE — Progress Notes (Signed)
PROGRESS NOTE  Emma Washington ZOX:[id number] DOB: 08/30/1961 DOA: 11/05/2017 PCP: No primary care provider on file.  Assessment/Plan Progressive severe necrotizing gallstone pancreatitis with inflammatory changes as seen on CT abd/pelvis 1/22.  - appreciate surgery and GI. Will continue conservative management, IV abx for now. WBC without significant change but remains afebrile and BC NGTD. - pulled NGT again. Continue TNA.  AKI. - creatinine stable off CRRT. Nephrology has signed off. Will check BMP in AM  Anemia of critical illness - Hgb stable. Continue to follow with periodic CBC  Hypocalcemia - improved with calcium supplementation yesterday  Acute hypoxic resp failure - wean oxygen as tolerated 1/25  S/p VDRF  Moderate hepatic steatosis with chronic elevation of transaminases  Morbid obesity  - per nutrition  Nutrition - TNA   Somewhat better today, plan PT, continue treatments as above.  DVT prophylaxis: SCDs Code Status: full Family Communication: discussed with brother in [place] Disposition Plan: pending    Brendia Sacksaniel Goodrich, MD  [facility] Direct contact: [phone] --Via amion app OR  --[email or web address]; password [email or web address]  7PM-7AM contact night coverage as above 11/14/2017, 11:37 AM  LOS: 11 days   Consultants:  General surgery  Procedures:    Antimicrobials:  Meropenem 1/23 >>  Zosyn 1/14 >> 1/22  Interval history/Subjective: Feels better today, less abd pain.   Pulled out NGT last night. Confused at times.  Objective: Vitals:  Vitals:   11/14/17 0800 11/14/17 1000  BP: (!) 131/58 (!) 147/72  Pulse: (!) 109 (!) 110  Resp: (!) 29 (!) 24  Temp:    SpO2: 95% 95%    Exam:  Constitutional:   . Appears calm, mildly uncomfortable Eyes:  . pupils and irises appear normal . Normal lids  ENMT:  . grossly normal hearing  . Lips appear normal Respiratory:  . CTA bilaterally, no w/r/r.  . Respiratory effort normal.    Cardiovascular:  . RRR, no m/r/g . No LE extremity edema   Abdomen:  . Soft, nt Musculoskeletal:  . RUE, LUE, RLE, LLE   o strength globally weak and tone normal, no atrophy, no abnormal movements Psychiatric:  . Mental status o Mood, affect appropriate  I have personally reviewed the following:  Filed Weights   11/13/17 0433 11/13/17 0949 11/14/17 0500  Weight: 131 kg (288 lb 12.8 oz) 129.1 kg (284 lb 9.8 oz) 134.3 kg (296 lb 1.2 oz)   Weight change: -1.9 kg (-3 oz)  UOP: 1910 1/25 I/O since admission: +6.4L Last BM charted: 1/22 Foley: yes Telemetry: ST  Status: SDU  Labs:  CBG stable 1/25  Creatinine 1.47 >> 2.25 >> 2.32 >> 2.27 off CRRT  Albumin 1.8  WBC 27.1 >> 30.3 >> 25.0 >> 25.0  Hgb 10.4 >> 9.7 >> 9.0 >> 8.3 >> 8.2  Calcium 6.6 >> 6.8 >> 6.7 >> 6.9 uncorrected, corrected 8.7  Imaging studies:     Scheduled Meds: . chlorhexidine  15 mL Mouth Rinse BID  . Chlorhexidine Gluconate Cloth  6 each Topical Daily  . fentaNYL  50 mcg Transdermal Q72H  . insulin aspart  0-15 Units Subcutaneous Q4H  . mouth rinse  15 mL Mouth Rinse q12n4p  . pantoprazole (PROTONIX) IV  40 mg Intravenous Q12H  . sodium chloride flush  10-40 mL Intracatheter Q12H   Continuous Infusions: . sodium chloride 40 mL/hr at 11/14/17 1000  . calcium gluconate    . meropenem (MERREM) IV Stopped (11/14/17 0955)  . TPN (CLINIMIX) Adult without lytes 40 mL/hr at  11/14/17 1000  . TPN (CLINIMIX) Adult without lytes      Principal Problem:   Gallstone pancreatitis Active Problems:   Hypocalcemia   Pancreatic necrosis   AKI (acute kidney injury) (HCC)   Anemia   Obesity, Class III, BMI 40-49.9 (morbid obesity) (HCC)   LOS: 11 days

## 2017-11-14 NOTE — Progress Notes (Signed)
NUTRITION NOTE  Full follow-up note by this RD yesterday with note at 10:16 AM. Pt pulled NGT overnight and refusing to have another put in. She has some slight abdominal pain but denies nausea. Surgery PA note from this AM states that pt with severe, necrotizing pancreatitis with no acute need for surgery at this time. Surgery recommending PT/OT now that pt off of CRRT. Per GI NP note this AM, suspect that pt passed gallstone as LFTs have begun to improve.  Pt triple lumen R IJ and is receiving Clinimix 5/15 @ 40 mL/hr with ILE on hold x7 days per ICU protocol. This regimen is providing 48 grams of protein and 682 kcal. Pharmacy note from this AM states plan to continue this rate today d/t hyperglycemia.     Estimated Nutritional Needs:  Kcal:  [id number]  Protein:  >/= 135 grams Fluid:  >/= 1.8 L/day     Emma GammonJessica Raysha Tilmon, MS, RD, LDN, [facility] Inpatient Clinical Dietitian Pager # [phone] After hours/weekend pager # [phone]

## 2017-11-14 NOTE — Progress Notes (Signed)
Subjective: Interval History: 2 L UOP, creat 2.2 no change, pain better, pulled out NG. Na up 147  Objective: Vital signs in last 24 hours: Temp:  [97.6 F (36.4 C)-99.2 F (37.3 C)] 97.9 F (36.6 C) (01/25 1200) Pulse Rate:  [103-119] 110 (01/25 1000) Resp:  [24-40] 24 (01/25 1000) BP: (113-161)/(38-80) 147/72 (01/25 1000) SpO2:  [87 %-95 %] 95 % (01/25 1000) Weight:  [134.3 kg (296 lb 1.2 oz)] 134.3 kg (296 lb 1.2 oz) (01/25 0500) Weight change: -1.9 kg (-3 oz)  Intake/Output from previous day: 01/24 0701 - 01/25 0700 In: 2307.2 [I.V.:1742.2; NG/GT:155; IV Piggyback:410] Out: 3060 [Urine:1910; Emesis/NG output:1150] Intake/Output this shift: Total I/O In: 480 [I.V.:380; IV Piggyback:100] Out: 465 [Urine:465]  Gen: awake, more alert and interactive Neck: no jvd Resp: clear bilat Cardio: S1, S2 normal GI: distended does have some bs,  Extremities: edema 1+  Lab Results: Recent Labs    11/13/17 0443 11/14/17 0626  WBC 25.0* 25.0*  HGB 8.3* 8.2*  HCT 25.8* 25.6*  PLT 328 403*   BMET:  Recent Labs    11/13/17 1110 11/14/17 0626  NA 145 147*  K 4.1 4.0  CL 111 112*  CO2 25 24  GLUCOSE 138* 199*  BUN 53* 60*  CREATININE 2.32* 2.27*  CALCIUM 6.6* 6.9*   No results for input(s): PTH in the last 72 hours. Iron Studies: No results for input(s): IRON, TIBC, TRANSFERRIN, FERRITIN in the last 72 hours.  CXR - bibasilar atx, LLL density/ infiltrate  I have reviewed the patient's current medications.  Assessment: 1 AKI - nonoliguric, sp CRRT.  Resolving, creat stable low 2's.  2 Vol , stable 3 Obesity. 4 Gallstone necrotizing pancreatitis , improving 5 Hypernatremia , change IVF to D5W 6  Nut'n on TNA at 40/hr  P - will sign off   LOS: 11 days   Maree Krabbeobert D Avneet Ashmore 11/14/2017,2:25 PM

## 2017-11-15 LAB — RENAL FUNCTION PANEL
ANION GAP: 8 (ref 5–15)
Albumin: 1.9 g/dL — ABNORMAL LOW (ref 3.5–5.0)
BUN: 62 mg/dL — ABNORMAL HIGH (ref 6–20)
CHLORIDE: 112 mmol/L — AB (ref 101–111)
CO2: 24 mmol/L (ref 22–32)
Calcium: 7 mg/dL — ABNORMAL LOW (ref 8.9–10.3)
Creatinine, Ser: 2.3 mg/dL — ABNORMAL HIGH (ref 0.44–1.00)
GFR calc Af Amer: 26 mL/min — ABNORMAL LOW (ref >60)
GFR calc non Af Amer: 23 mL/min — ABNORMAL LOW (ref >60)
GLUCOSE: 185 mg/dL — AB (ref 65–99)
PHOSPHORUS: 4.4 mg/dL (ref 2.5–4.6)
POTASSIUM: 4.3 mmol/L (ref 3.5–5.1)
Sodium: 144 mmol/L (ref 135–145)

## 2017-11-15 LAB — GLUCOSE, CAPILLARY
GLUCOSE-CAPILLARY: 185 mg/dL — AB (ref 65–99)
GLUCOSE-CAPILLARY: 160 mg/dL — AB (ref 65–99)
GLUCOSE-CAPILLARY: 173 mg/dL — AB (ref 65–99)
GLUCOSE-CAPILLARY: 172 mg/dL — AB (ref 65–99)
Glucose-Capillary: 155 mg/dL — ABNORMAL HIGH (ref 65–99)
Glucose-Capillary: 166 mg/dL — ABNORMAL HIGH (ref 65–99)
Glucose-Capillary: 177 mg/dL — ABNORMAL HIGH (ref 65–99)

## 2017-11-15 MED ORDER — SODIUM CHLORIDE 0.9% FLUSH
10.0000 mL | INTRAVENOUS | Status: DC | PRN
  Administered 2017-11-17 – 2017-11-18 (×2): 10 mL
  Administered 2017-11-19: 20 mL
  Administered 2017-11-28 – 2017-12-02 (×2): 10 mL
  Filled 2017-11-15 (×5): qty 40

## 2017-11-15 MED ORDER — TRACE MINERALS CR-CU-MN-SE-ZN 10-1000-500-60 MCG/ML IV SOLN
INTRAVENOUS | Status: AC
  Administered 2017-11-15: 18:00:00 via INTRAVENOUS
  Filled 2017-11-15 (×2): qty 960

## 2017-11-15 MED ORDER — SODIUM CHLORIDE 0.45 % IV SOLN
INTRAVENOUS | Status: DC
  Administered 2017-11-15 – 2017-11-18 (×4): via INTRAVENOUS

## 2017-11-15 MED ORDER — SODIUM CHLORIDE 0.9 % IV SOLN
1.0000 g | Freq: Once | INTRAVENOUS | Status: AC
  Administered 2017-11-15: 1 g via INTRAVENOUS
  Filled 2017-11-15: qty 10

## 2017-11-15 NOTE — Progress Notes (Signed)
Patient ID: Emma [facility], female   DOB: 12/15/1960, 57 y.o.   MRN: [id number]     Subjective: Complaining of some cough and a couple of spells of choking sensation in her throat.  States abdominal pain is minimal.  Denies nausea.  Objective: Vital signs in last 24 hours: Temp:  [97.8 F (36.6 C)-99.1 F (37.3 C)] 98.2 F (36.8 C) (01/26 0800) Pulse Rate:  [106-129] 129 (01/26 0400) Resp:  [24-33] 29 (01/26 0400) BP: (131-152)/(36-75) 152/61 (01/26 0400) SpO2:  [93 %-96 %] 94 % (01/26 0400) Weight:  [139.5 kg (307 lb 8.7 oz)] 139.5 kg (307 lb 8.7 oz) (01/26 0451) Last BM Date: 11/12/17  Intake/Output from previous day: 01/25 0701 - 01/26 0700 In: 2384.3 [I.V.:2184.3; IV Piggyback:200] Out: 1865 [Urine:1865] Intake/Output this shift: No intake/output data recorded.  General appearance: alert, cooperative, morbidly obese and More alert and brighter GI: Moderate distention.  Mild unchanged upper abdominal tenderness.  Lab Results:  Recent Labs    11/13/17 0443 11/14/17 0626  WBC 25.0* 25.0*  HGB 8.3* 8.2*  HCT 25.8* 25.6*  PLT 328 403*   BMET Recent Labs    11/14/17 0626 11/15/17 0249  NA 147* 144  K 4.0 4.3  CL 112* 112*  CO2 24 24  GLUCOSE 199* 185*  BUN 60* 62*  CREATININE 2.27* 2.30*  CALCIUM 6.9* 7.0*     Studies/Results: Dg Abd Portable 1v  Result Date: 11/13/2017 CLINICAL DATA:  NG tube placement. EXAM: PORTABLE ABDOMEN - 1 VIEW COMPARISON:  No recent prior. FINDINGS: NG tube noted with tip coiled stomach. No bowel distention noted. Bibasilar atelectasis. No acute bony abnormality noted. IMPRESSION: NG tube noted with tip coiled in the stomach. Electronically Signed   By: Maisie Fus  Register   On: 11/13/2017 10:07    Anti-infectives: Anti-infectives (From admission, onward)   Start     Dose/Rate Route Frequency Ordered Stop   11/12/17 1800  meropenem (MERREM) 1 g in sodium chloride 0.9 % 100 mL IVPB     1 g 200 mL/hr over 30 Minutes Intravenous Every 8  hours 11/12/17 1115     11/11/17 1000  meropenem (MERREM) 1 g in sodium chloride 0.9 % 100 mL IVPB  Status:  Discontinued     1 g 200 mL/hr over 30 Minutes Intravenous Every 12 hours 11/11/17 0911 11/12/17 1115   11/09/17 1000  piperacillin-tazobactam (ZOSYN) IVPB 3.375 g  Status:  Discontinued     3.375 g 12.5 mL/hr over 240 Minutes Intravenous Every 8 hours 11/09/17 0614 11/09/17 0615   11/09/17 1000  piperacillin-tazobactam (ZOSYN) IVPB 3.375 g  Status:  Discontinued     3.375 g 100 mL/hr over 30 Minutes Intravenous Every 6 hours 11/09/17 0616 11/11/17 0911   11/07/17 2200  piperacillin-tazobactam (ZOSYN) IVPB 2.25 g  Status:  Discontinued     2.25 g 100 mL/hr over 30 Minutes Intravenous Every 8 hours 11/07/17 1218 11/07/17 1741   11/07/17 2200  piperacillin-tazobactam (ZOSYN) IVPB 3.375 g  Status:  Discontinued     3.375 g 12.5 mL/hr over 240 Minutes Intravenous Every 6 hours 11/07/17 1741 11/07/17 1747   11/07/17 2200  piperacillin-tazobactam (ZOSYN) IVPB 3.375 g  Status:  Discontinued     3.375 g 100 mL/hr over 30 Minutes Intravenous Every 6 hours 11/07/17 1747 11/09/17 0613   11/04/17 1100  piperacillin-tazobactam (ZOSYN) IVPB 3.375 g  Status:  Discontinued     3.375 g 12.5 mL/hr over 240 Minutes Intravenous Every 8 hours 11/04/17 1059 11/07/17 1218  October 16, 2018 1200  piperacillin-tazobactam (ZOSYN) IVPB 3.375 g     3.375 g 100 mL/hr over 30 Minutes Intravenous  Once October 16, 2018 1157 October 16, 2018 1309      Assessment/Plan: Severe gallstone pancreatitis with multiple organ failure.  Now off ventilator.  Off dialysis with stable renal insufficiency.  Some tachycardia this morning, question relative volume depletion. Last CT showed severe edematous changes of pancreatitis without complicating factors. NG out.  Denies nausea.  Continue TNA for nutrition and supportive care Leukocytosis which is stable.  Continue antibiotics for now.    LOS: 12 days    Mariella SaaBenjamin T Jezabel Lecker 11/15/2017

## 2017-11-15 NOTE — Progress Notes (Signed)
Spoke with Dr Arlean HoppingSchertz re PICC placement.  States okay to place PICC and remove HD cath after PICC is confirmed.

## 2017-11-15 NOTE — Progress Notes (Signed)
PHARMACY - ADULT TOTAL PARENTERAL NUTRITION CONSULT NOTE   Pharmacy Consult for TPN Indication: severe pancreatitis  Patient Measurements: Height: 5\' 9"  (175.3 cm) Weight: (!) 307 lb 8.7 oz (139.5 kg) IBW/kg (Calculated) : 66.2 TPN AdjBW (KG): 83.2 Body mass index is 45.42 kg/m.  Insulin Requirements: 19 units Novolog (moderate scale) in previous 24hrs  Current Nutrition: NPO; TF stopped 1/22  IVF: NS at 3975ml/hr  Central access: R IJ HD catheter TPN start date: 1/24  ASSESSMENT                                                                                                          HPI: 57 y.o. Female with gallstone pancreatitis with necrosis s/p MRCP on 1/15.  Repeat LFTs and lipase normalizing.  No plans for surgical intervention at this time.  Tube feeding was started but had to be stopped on 1/22 due to nausea/vomiting.  Pharmacy is consulted to begin TPN. Patient's course has been complicated with respiratory failure and intubation and subsequent extubation on 11/09/2017.  She was also started on CRRT from 1/18-1/23.  Significant events:   Today: 11/15/2017   Glucose: No hx DM. CBGs elevated since starting TPN (CBG: 180-185)  Electrolytes: CoCa low 8.68, Mg elevated 2.7 (1/25), Na/K/Phos WNL  Renal: AKI - CRRT 1/18-1/23. SCr elevated but stable off CRRT, UOP 1855cc yesterday.  Weight increased today 5.2kg overnight, 26kg from admit wt  LFTs: AST/ALT elevated ~2x ULN, tbili and alkphos WNL (1/23)  TGs: 213 (1/15), 332 (1/18)  Prealbumin: 6.7 (1/25)  NUTRITIONAL GOALS                                                                                             RD recs 1/22: 2270-2405 kcal, >/= 135 grams protein per day Clinimix 5/15 at a goal rate of 1710ml/hr will provide 132g/day protein, 1874Kcal/day. After 7 days, the addition of 20% fat emulsion at 520ml/hr to provide 132g/day protein, 2354Kcal/day.  **Patient may not be able to tolerate this amount of fluid given  current renal function.  PLAN  Now: calcium gluconate 1g IV x 1  At 1800 today:  Continue Clinimix 5/15 (no electrolytes d/t elevated Mg/SCr) at 51ml/hr.  Hold 20% lipid emulsion for first 7 days for ICU patients per ASPEN guidelines (Start date 11/20/17)  Plan to advance as tolerated to the goal rate -->once CBGs improved  TPN to contain standard multivitamins and trace elements.  Add 15 units insulin to TPN bag.   IVF per MD- discussed with Dr Irene Limbo & will stop IVF for now.   Continue SSI q4h MODERATE scale .   TPN lab panels on Mondays & Thursdays.  F/u daily  Junita Push, PharmD, BCPS Pager: [phone] 11/15/2017,8:25 AM

## 2017-11-15 NOTE — Progress Notes (Signed)
PT Cancellation Note  Patient Details Name: Emma Washington MRN: [id number] DOB: 03-Dec-1960   Cancelled Treatment:    Reason Eval/Treat Not Completed: Other (comment) RN reports just medicating pt with Haldol and other meds.  Requests for PT to check back at a later time.  Will check back as schedule permits.   Arhum Peeples,KATHrine E 11/15/2017, 9:49 AM Zenovia JarredKati Tinea Nobile, PT, DPT 11/15/2017 Pager: [phone]

## 2017-11-15 NOTE — Progress Notes (Signed)
Peripherally Inserted Central Catheter/Midline Placement  The IV Nurse has discussed with the patient and/or persons authorized to consent for the patient, the purpose of this procedure and the potential benefits and risks involved with this procedure.  The benefits include less needle sticks, lab draws from the catheter, and the patient may be discharged home with the catheter. Risks include, but not limited to, infection, bleeding, blood clot (thrombus formation), and puncture of an artery; nerve damage and irregular heartbeat and possibility to perform a PICC exchange if needed/ordered by physician.  Alternatives to this procedure were also discussed.  Bard Power PICC patient education guide, fact sheet on infection prevention and patient information card has been provided to patient /or left at bedside.    PICC/Midline Placement Documentation  PICC Double Lumen 11/15/17 PICC Right Cephalic 40 cm 0 cm (Active)  Indication for Insertion or Continuance of Line Administration of hyperosmolar/irritating solutions (i.e. TPN, Vancomycin, etc.) 11/15/2017  5:00 PM  Exposed Catheter (cm) 0 cm 11/15/2017  5:00 PM  Site Assessment Clean;Dry;Intact 11/15/2017  5:00 PM  Lumen #1 Status Flushed;Saline locked;Blood return noted 11/15/2017  5:00 PM  Lumen #2 Status Infusing;Flushed;Saline locked;Blood return noted 11/15/2017  5:00 PM  Dressing Type Transparent;Non-occlusive 11/15/2017  5:00 PM  Dressing Status Clean;Dry;Intact;Antimicrobial disc in place 11/15/2017  5:00 PM  Line Care Connections checked and tightened 11/15/2017  5:00 PM  Line Adjustment (NICU/IV Team Only) No 11/15/2017  5:00 PM  Dressing Intervention New dressing 11/15/2017  5:00 PM  Dressing Change Due 11/22/17 11/15/2017  5:00 PM       Jani Filesonald G Ahnyla Mendel 11/15/2017, 6:05 PM

## 2017-11-15 NOTE — Progress Notes (Signed)
PROGRESS NOTE  Emma Washington ZOX:[id number] DOB: Mar 19, 1961 DOA: 11-28-17 PCP: No primary care provider on file.  Assessment/Plan Progressive severe necrotizing gallstone pancreatitis with inflammatory changes as seen on CT abd/pelvis 1/22.  - continue management with TNA, IVF, abx. Appreciate surgery and GI recommendations. ERCP planned in future.  AKI. - creatinine remains stable off CRRT. UOP adequate. - BMP in AM  Anemia of critical illness - has been stable, will check CBC in AM  Hypocalcemia - stable; replete  Acute hypoxic resp failure - still on 4 L [place]. Resp status appears stable. - wean oxygen as tolerated - check CXR in AM  S/p VDRF  Moderate hepatic steatosis with chronic elevation of transaminases  Morbid obesity  - per nutrition  Nutrition - TNA per pharmacy - spoke with Dr. Arlean Hopping, ok to place PICC  Hemodynamics and resp status stable; will continue tx as above and transfer to medical floor  DVT prophylaxis: SCDs Code Status: full Family Communication:  Disposition Plan: pending    Brendia Sacks, MD  [facility] Direct contact: [phone] --Via amion app OR  --[email or web address]; password [email or web address]  7PM-7AM contact night coverage as above 11/15/2017, 9:47 AM  LOS: 12 days   Consultants:  General surgery  Procedures:    Antimicrobials:  Meropenem 1/23 >>  Zosyn 1/14 >> 1/22  Interval history/Subjective: Confused at times.  Minimal pain now. Vomiting x1.   Objective: Vitals:  Vitals:   11/15/17 0400 11/15/17 0800  BP: (!) 152/61 133/64  Pulse: (!) 129 (!) 116  Resp: (!) 29 (!) 32  Temp:  98.2 F (36.8 C)  SpO2: 94% (!) 88%    Exam:  Constitutional:   . Appears calm and comfortable Eyes:  . pupils and irises appear normal . Normal lids   ENMT:  . grossly normal hearing  . Lips appear normal Respiratory:  . CTA bilaterally, no w/r/r.  . Respiratory effort normal.   Cardiovascular:  . RRR, no m/r/g . No LE  extremity edema   . Telemetry ST Abdomen:  . Soft  Musculoskeletal:  . RUE, LUE, RLE, LLE   o strength and tone normal, no atrophy, no abnormal movements Skin:  . No rashes, lesions, ulcers Psychiatric:  . Mental status o Mood, affect appropriate   I have personally reviewed the following:  Filed Weights   11/13/17 0949 11/14/17 0500 11/15/17 0451  Weight: 129.1 kg (284 lb 9.8 oz) 134.3 kg (296 lb 1.2 oz) (!) 139.5 kg (307 lb 8.7 oz)   Weight change: 10.4 kg (22 lb 14.9 oz)  UOP: 1865 I/O since admission: +7L Last BM charted: 1/22 but on TNA Foley: yes Telemetry: ST  Status: SDU >> med surg  Labs:  CBG stable 1/26  Creatinine 1.47 >> 2.25 >> 2.32 >> 2.27 >> 2.3 off CRRT  Albumin 1.9  WBC 27.1 >> 30.3 >> 25.0 >> 25.0  Hgb 10.4 >> 9.7 >> 9.0 >> 8.3 >> 8.2  Calcium 6.6 >> 6.8 >> 6.7 >> 7.0 uncorrected, corrected 8.7  Imaging studies:     Scheduled Meds: . chlorhexidine  15 mL Mouth Rinse BID  . Chlorhexidine Gluconate Cloth  6 each Topical Daily  . fentaNYL  50 mcg Transdermal Q72H  . insulin aspart  0-15 Units Subcutaneous Q4H  . mouth rinse  15 mL Mouth Rinse q12n4p  . pantoprazole (PROTONIX) IV  40 mg Intravenous Q12H  . sodium chloride flush  10-40 mL Intracatheter Q12H   Continuous Infusions: . calcium gluconate    .  meropenem (MERREM) IV 1 g (11/15/17 0858)  . TPN (CLINIMIX) Adult without lytes 40 mL/hr at 11/15/17 0449  . TPN (CLINIMIX) Adult without lytes      Principal Problem:   Gallstone pancreatitis Active Problems:   Hypocalcemia   Pancreatic necrosis   AKI (acute kidney injury) (HCC)   Anemia   Obesity, Class III, BMI 40-49.9 (morbid obesity) (HCC)   LOS: 12 days

## 2017-11-15 NOTE — Progress Notes (Signed)
    Progress Note   Subjective  Complaining of SOB this morning. Mild abd pain which is stable.    Objective  Vital signs in last 24 hours: Temp:  [97.8 F (36.6 C)-99.1 F (37.3 C)] 98.2 F (36.8 C) (01/26 0800) Pulse Rate:  [106-129] 116 (01/26 0800) Resp:  [24-33] 32 (01/26 0800) BP: (131-152)/(36-75) 133/64 (01/26 0800) SpO2:  [88 %-96 %] 88 % (01/26 0800) Weight:  [307 lb 8.7 oz (139.5 kg)] 307 lb 8.7 oz (139.5 kg) (01/26 0451) Last BM Date: 11/12/17  General: Alert, well-developed, uncomfortable, mild resp distress Heart:  Regular rate and rhythm; no murmurs Chest: Clear to ascultation bilaterally Abdomen:  Soft, mild upper abdomen tenderness and moderate distention. Normal bowel sounds, without guarding, and without rebound.   Extremities:  Without edema. Neurologic:  Alert and  oriented x4; grossly normal neurologically. Psych:  Alert and cooperative. Normal mood and affect.  Intake/Output from previous day: 01/25 0701 - 01/26 0700 In: 2384.3 [I.V.:2184.3; IV Piggyback:200] Out: 1865 [Urine:1865] Intake/Output this shift: No intake/output data recorded.  Lab Results: Recent Labs    11/13/17 0443 11/14/17 0626  WBC 25.0* 25.0*  HGB 8.3* 8.2*  HCT 25.8* 25.6*  PLT 328 403*   BMET Recent Labs    11/13/17 1110 11/14/17 0626 11/15/17 0249  NA 145 147* 144  K 4.1 4.0 4.3  CL 111 112* 112*  CO2 25 24 24   GLUCOSE 138* 199* 185*  BUN 53* 60* 62*  CREATININE 2.32* 2.27* 2.30*  CALCIUM 6.6* 6.9* 7.0*   LFT Recent Labs    11/14/17 0626 11/15/17 0249  PROT 5.4*  --   ALBUMIN 1.9*  1.8* 1.9*  AST 57*  --   ALT 50  --   ALKPHOS 115  --   BILITOT 0.6  --   BILIDIR 0.3  --   IBILI 0.3  --       Assessment & Plan   1. Severe necrotizing biliary pancreatitis with pulmonary and renal impairment. Persistent but stable leukocytosis. LFTs improved. Continue supportive care, TNA, IV meropenem. ERCP when pancreatitis has resolved.   2. AKI off CRRT. Cr  stable at 2.30.   3. SOB, increased resp effort. Primary service to evaluate.     LOS: 12 days   Lilburn Straw T. Russella DarStark MD 11/15/2017, 10:37 AM

## 2017-11-16 LAB — CULTURE, BLOOD (ROUTINE X 2)
CULTURE: NO GROWTH
Culture: NO GROWTH
SPECIAL REQUESTS: ADEQUATE
Special Requests: ADEQUATE

## 2017-11-16 LAB — GLUCOSE, CAPILLARY
GLUCOSE-CAPILLARY: 175 mg/dL — AB (ref 65–99)
GLUCOSE-CAPILLARY: 166 mg/dL — AB (ref 65–99)
GLUCOSE-CAPILLARY: 148 mg/dL — AB (ref 65–99)
Glucose-Capillary: 149 mg/dL — ABNORMAL HIGH (ref 65–99)
Glucose-Capillary: 158 mg/dL — ABNORMAL HIGH (ref 65–99)
Glucose-Capillary: 159 mg/dL — ABNORMAL HIGH (ref 65–99)

## 2017-11-16 LAB — RENAL FUNCTION PANEL
ALBUMIN: 1.9 g/dL — AB (ref 3.5–5.0)
ANION GAP: 6 (ref 5–15)
BUN: 61 mg/dL — ABNORMAL HIGH (ref 6–20)
CALCIUM: 7.1 mg/dL — AB (ref 8.9–10.3)
CO2: 25 mmol/L (ref 22–32)
CREATININE: 2.09 mg/dL — AB (ref 0.44–1.00)
Chloride: 116 mmol/L — ABNORMAL HIGH (ref 101–111)
GFR calc non Af Amer: 26 mL/min — ABNORMAL LOW (ref >60)
GFR, EST AFRICAN AMERICAN: 30 mL/min — AB (ref >60)
GLUCOSE: 183 mg/dL — AB (ref 65–99)
Phosphorus: 4.4 mg/dL (ref 2.5–4.6)
Potassium: 3.9 mmol/L (ref 3.5–5.1)
SODIUM: 147 mmol/L — AB (ref 135–145)

## 2017-11-16 LAB — CBC
HCT: 25.8 % — ABNORMAL LOW (ref 36.0–46.0)
Hemoglobin: 8.2 g/dL — ABNORMAL LOW (ref 12.0–15.0)
MCH: 30.3 pg (ref 26.0–34.0)
MCHC: 31.8 g/dL (ref 30.0–36.0)
MCV: 95.2 fL (ref 78.0–100.0)
PLATELETS: 541 10*3/uL — AB (ref 150–400)
RBC: 2.71 MIL/uL — AB (ref 3.87–5.11)
RDW: 17.3 % — ABNORMAL HIGH (ref 11.5–15.5)
WBC: 20.1 10*3/uL — ABNORMAL HIGH (ref 4.0–10.5)

## 2017-11-16 MED ORDER — TRACE MINERALS CR-CU-MN-SE-ZN 10-1000-500-60 MCG/ML IV SOLN
INTRAVENOUS | Status: AC
  Administered 2017-11-16: 18:00:00 via INTRAVENOUS
  Filled 2017-11-16: qty 960

## 2017-11-16 MED ORDER — SODIUM CHLORIDE 0.9 % IV SOLN
1.0000 g | Freq: Once | INTRAVENOUS | Status: AC
  Administered 2017-11-16: 1 g via INTRAVENOUS
  Filled 2017-11-16: qty 10

## 2017-11-16 NOTE — Progress Notes (Signed)
  PROGRESS NOTE  Emma MantisJoyce Washington WGN:[id number] DOB: 02-Mar-1961 DOA: May 19, 2018 PCP: No primary care provider on file.  Assessment/Plan Progressive severe necrotizing gallstone pancreatitis with inflammatory changes as seen on CT abd/pelvis 1/22.  - continue TNA, IVF. Finish abx today.  - diet per GI, surgery - ERCP planned in future  AKI. - creatinine improving spontaneously. UOP excellent.  Anemia of critical illness - Hgb remains stable  Hypocalcemia - slowly improving with repletion  Acute hypoxic resp failure -  Improving, decreased oxygen requirement - continue to wean oxygen as tolerated  S/p VDRF  Moderate hepatic steatosis with chronic elevation of transaminases  Morbid obesity  - per nutrition  Nutrition - TNA per pharmacy 1/27  Appears to be improving.  DVT prophylaxis: SCDs Code Status: full Family Communication:  Disposition Plan: SNF per PT   Brendia Sacksaniel Demaurion Dicioccio, MD  [facility] Direct contact: [phone] --Via amion app OR  --[email or web address]; password [email or web address]  7PM-7AM contact night coverage as above 11/16/2017, 4:14 PM  LOS: 13 days   Consultants:  General surgery  Procedures:    Antimicrobials:  Meropenem 1/23 >> 1/27  Zosyn 1/14 >> 1/22  Interval history/Subjective: Mouth is dry, tolerating ice chips; reports lower abdominal pain. Breathing ok.  Objective: Vitals:  Vitals:   11/16/17 1359 11/16/17 1435  BP: (!) 141/72   Pulse: (!) 111   Resp: (!) 36   Temp:    SpO2: (!) 88% 90%    Exam:  Constitutional:   . Appears calm, mildly uncomfortable; appears much better today Respiratory:  . CTA bilaterally, no w/r/r.  . Respiratory effort normal.  Cardiovascular:  . RRR, no m/r/g Abdomen:  . Soft, obese, lower abd pain with palpation Psychiatric:  . Mental status o Mood, affect appropriate . judgement and insight appear intact  I have personally reviewed the following:  Filed Weights   11/14/17 0500 11/15/17 0451  11/16/17 0530  Weight: 134.3 kg (296 lb 1.2 oz) (!) 139.5 kg (307 lb 8.7 oz) 61.7 kg (136 lb)   Weight change: -77.811 kg (-8.7 oz)  UOP: 2150 I/O since admission: +6.5 L Last BM charted: 1/22 but on TNA Foley: yes Telemetry: ST  Status: med surg  Labs:  CBG stable  Creatinine  2.32 >> 2.27 >> 2.3 >> 2.09  Albumin 1.9  WBC 27.1 >> 30.3 >> 25.0 >> 25.0 >> 20.1  Hgb 10.4 >> 9.7 >> 9.0 >> 8.3 >> 8.2 >> 8.2  Calcium 6.6 >> 6.8 >> 6.7 >> 7.0 uncorrected, corrected 8.8  Scheduled Meds: . chlorhexidine  15 mL Mouth Rinse BID  . fentaNYL  50 mcg Transdermal Q72H  . insulin aspart  0-15 Units Subcutaneous Q4H  . mouth rinse  15 mL Mouth Rinse q12n4p  . pantoprazole (PROTONIX) IV  40 mg Intravenous Q12H  . sodium chloride flush  10-40 mL Intracatheter Q12H   Continuous Infusions: . sodium chloride 50 mL/hr at 11/16/17 0600  . meropenem (MERREM) IV Stopped (11/16/17 1054)  . TPN (CLINIMIX) Adult without lytes 40 mL/hr at 11/16/17 0600  . TPN (CLINIMIX) Adult without lytes      Principal Problem:   Gallstone pancreatitis Active Problems:   Hypocalcemia   Pancreatic necrosis   AKI (acute kidney injury) (HCC)   Anemia   Obesity, Class III, BMI 40-49.9 (morbid obesity) (HCC)   LOS: 13 days

## 2017-11-16 NOTE — Progress Notes (Signed)
Patient ID: Emma Washington, female   DOB: Mar 15, 1961, 57 y.o.   MRN: [id number]     Subjective: No specific complaints.  She is fatigued and nursing states that she was presyncopal when sitting up on the edge of the bed.  States she is passing gas.  Denies nausea or pain.  Objective: Vital signs in last 24 hours: Temp:  [97.7 F (36.5 C)-98.6 F (37 C)] 98.1 F (36.7 C) (01/27 0530) Pulse Rate:  [102-117] 106 (01/27 0530) Resp:  [20-32] 20 (01/27 0530) BP: (131-136)/(63-88) 134/88 (01/27 0530) SpO2:  [92 %-97 %] 97 % (01/27 0530) Weight:  [61.7 kg (136 lb)] 61.7 kg (136 lb) (01/27 0530) Last BM Date: 11/15/17  Intake/Output from previous day: 01/26 0701 - 01/27 0700 In: 1779.8 [I.V.:1679.8; IV Piggyback:100] Out: 2150 [Urine:2150] Intake/Output this shift: No intake/output data recorded.  General appearance: alert, cooperative, no distress and morbidly obese Resp: No wheezing or increased work of breathing GI: Somewhat less distended.  No significant tenderness.  Lab Results:  Recent Labs    11/14/17 0626 11/16/17 0444  WBC 25.0* 20.1*  HGB 8.2* 8.2*  HCT 25.6* 25.8*  PLT 403* 541*   BMET Recent Labs    11/15/17 0249 11/16/17 0444  NA 144 147*  K 4.3 3.9  CL 112* 116*  CO2 24 25  GLUCOSE 185* 183*  BUN 62* 61*  CREATININE 2.30* 2.09*  CALCIUM 7.0* 7.1*     Studies/Results: No results found.  Anti-infectives: Anti-infectives (From admission, onward)   Start     Dose/Rate Route Frequency Ordered Stop   11/12/17 1800  meropenem (MERREM) 1 g in sodium chloride 0.9 % 100 mL IVPB     1 g 200 mL/hr over 30 Minutes Intravenous Every 8 hours 11/12/17 1115     11/11/17 1000  meropenem (MERREM) 1 g in sodium chloride 0.9 % 100 mL IVPB  Status:  Discontinued     1 g 200 mL/hr over 30 Minutes Intravenous Every 12 hours 11/11/17 0911 11/12/17 1115   11/09/17 1000  piperacillin-tazobactam (ZOSYN) IVPB 3.375 g  Status:  Discontinued     3.375 g 12.5 mL/hr over 240  Minutes Intravenous Every 8 hours 11/09/17 0614 11/09/17 0615   11/09/17 1000  piperacillin-tazobactam (ZOSYN) IVPB 3.375 g  Status:  Discontinued     3.375 g 100 mL/hr over 30 Minutes Intravenous Every 6 hours 11/09/17 0616 11/11/17 0911   11/07/17 2200  piperacillin-tazobactam (ZOSYN) IVPB 2.25 g  Status:  Discontinued     2.25 g 100 mL/hr over 30 Minutes Intravenous Every 8 hours 11/07/17 1218 11/07/17 1741   11/07/17 2200  piperacillin-tazobactam (ZOSYN) IVPB 3.375 g  Status:  Discontinued     3.375 g 12.5 mL/hr over 240 Minutes Intravenous Every 6 hours 11/07/17 1741 11/07/17 1747   11/07/17 2200  piperacillin-tazobactam (ZOSYN) IVPB 3.375 g  Status:  Discontinued     3.375 g 100 mL/hr over 30 Minutes Intravenous Every 6 hours 11/07/17 1747 11/09/17 0613   11/04/17 1100  piperacillin-tazobactam (ZOSYN) IVPB 3.375 g  Status:  Discontinued     3.375 g 12.5 mL/hr over 240 Minutes Intravenous Every 8 hours 11/04/17 1059 11/07/17 1218   10/23/2017 1200  piperacillin-tazobactam (ZOSYN) IVPB 3.375 g     3.375 g 100 mL/hr over 30 Minutes Intravenous  Once 11/20/2017 1157 11/10/2017 1309      Assessment/Plan: Severe gallstone pancreatitis.  Ventilator dependent respiratory failure and acute renal failure resolving.  She is very deconditioned.  Will need  therapy.  Probably okay to gradually start a diet.  Leukocytosis improving.  Will eventually need laparoscopic cholecystectomy but she may benefit from some rehab and having elective surgery at a later date.    LOS: 13 days    Mariella Saa 11/16/2017

## 2017-11-16 NOTE — Progress Notes (Signed)
PT Cancellation Note  Patient Details Name: Emma Washington MRN: [id number] DOB: [birth year]   Cancelled Treatment:    Reason Eval/Treat Not Completed: Pain limiting ability to participate(pt refused PT 2* back pain, pain meds requested. Will follow. )   Tamala SerUhlenberg, Kathreen Dileo Kistler 11/16/2017, 9:54 AM [phone]

## 2017-11-16 NOTE — Evaluation (Signed)
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 CT suggesting acute necrotizing pancreatitis with 1 mm stone in the left kidney. Pt developed VDRF, weaned 11/09/17, also was on CRRT.   Clinical Impression  Pt admitted with above diagnosis. Pt currently with functional limitations due to the deficits listed below (see PT Problem List). Attempted supine to sit, however pt reported back pain/nausea/dizziness when head of bed was raised to 40*, so returned to supine position. BP supine 141/72, BP with head of bed at 40* 137/59. SaO2 88% on room air, 95% on 2L O2, respiratory rate 36. Instructed pt in LE exercises for strengthening. Pt is significantly deconditioned and would benefit from ST-SNF.  Pt will benefit from skilled PT to increase their independence and safety with mobility to allow discharge to the venue listed below.       Follow Up Recommendations SNF    Equipment Recommendations  Other (comment)(TBD)    Recommendations for Other Services       Precautions / Restrictions Precautions Precautions: Fall Precaution Comments: monitor O2 Restrictions Weight Bearing Restrictions: No      Mobility  Bed Mobility Overal bed mobility: Needs Assistance Bed Mobility: Supine to Sit     Supine to sit: Max assist     General bed mobility comments: pt reported nausea/dizziness/back pain with attempted supine to sit, HOB raised to 40* when pt reported onset of symptoms so lowered HOB. BP with HOB 25* 141/72, BP with HOB 40* 137/59  Transfers                    Ambulation/Gait                Stairs            Wheelchair Mobility    Modified Rankin (Stroke Patients Only)       Balance                                             Pertinent  Vitals/Pain Pain Assessment: 0-10 Pain Score: 8  Pain Location: L low back Pain Descriptors / Indicators: Aching Pain Intervention(s): Limited activity within patient's tolerance;Monitored during session;Repositioned;Patient requesting pain meds-RN notified    Home Living Family/patient expects to be discharged to:: Private residence Living Arrangements: Alone Available Help at Discharge: Family;Available 24 hours/day   Home Access: Stairs to enter Entrance Stairs-Rails: Right   Home Layout: One level Home Equipment: None Additional Comments: pt plans to DC to brother's home, info above is for his home; pt lived alone PTA and was independent with mobility/ADLs    Prior Function Level of Independence: Independent               Hand Dominance        Extremity/Trunk Assessment   Upper Extremity Assessment Upper Extremity Assessment: Defer to OT evaluation    Lower Extremity Assessment Lower Extremity Assessment: Generalized weakness(limited eval 2* back pain, in supine ankle PF/DF 4/5, knee ext 4/5, sensation intact to light touch)    Cervical / Trunk Assessment Cervical / Trunk Assessment: (not assessed, pt unable to tolerate sitting)  Communication   Communication: No difficulties  Cognition Arousal/Alertness: Awake/alert Behavior During Therapy: WFL for tasks assessed/performed  Overall Cognitive Status: Within Functional Limits for tasks assessed                                        General Comments      Exercises General Exercises - Lower Extremity Ankle Circles/Pumps: AROM;10 reps;Both;Supine Quad Sets: AROM;5 reps;Both;Supine Gluteal Sets: AROM;Both;5 reps;Supine   Assessment/Plan    PT Assessment Patient needs continued PT services  PT Problem List Decreased strength;Decreased activity tolerance;Decreased mobility;Cardiopulmonary status limiting activity       PT Treatment Interventions DME instruction;Functional mobility  training;Therapeutic activities;Therapeutic exercise;Patient/family education    PT Goals (Current goals can be found in the Care Plan section)  Acute Rehab PT Goals Patient Stated Goal: to DC to brother's home PT Goal Formulation: With patient Time For Goal Achievement: 11/30/17 Potential to Achieve Goals: Fair    Frequency Min 3X/week   Barriers to discharge        Co-evaluation               AM-PAC PT "6 Clicks" Daily Activity  Outcome Measure Difficulty turning over in bed (including adjusting bedclothes, sheets and blankets)?: Unable Difficulty moving from lying on back to sitting on the side of the bed? : Unable Difficulty sitting down on and standing up from a chair with arms (e.g., wheelchair, bedside commode, etc,.)?: Unable Help needed moving to and from a bed to chair (including a wheelchair)?: Total Help needed walking in hospital room?: Total Help needed climbing 3-5 steps with a railing? : Total 6 Click Score: 6    End of Session   Activity Tolerance: Patient limited by pain;Treatment limited secondary to medical complications (Comment)(nausea, dizziness) Patient left: in bed;with call bell/phone within reach;with nursing/sitter in room Nurse Communication: Mobility status;Patient requests pain meds PT Visit Diagnosis: Muscle weakness (generalized) (M62.81);Difficulty in walking, not elsewhere classified (R26.2);Dizziness and giddiness (R42);Pain    Time: 1610-9604 PT Time Calculation (min) (ACUTE ONLY): 23 min   Charges:   PT Evaluation $PT Eval Moderate Complexity: 1 Mod PT Treatments $Therapeutic Exercise: 8-22 mins   PT G Codes:          Tamala Ser 11/16/2017, 2:07 PM [phone]

## 2017-11-16 NOTE — Progress Notes (Signed)
PHARMACY - ADULT TOTAL PARENTERAL NUTRITION CONSULT NOTE   Pharmacy Consult for TPN Indication: severe pancreatitis  Patient Measurements: Height: 5\' 9"  (175.3 cm) Weight: 136 lb (61.7 kg) IBW/kg (Calculated) : 66.2 TPN AdjBW (KG): 83.2 Body mass index is 20.08 kg/m.  Insulin Requirements: 18 units Novolog (moderate scale) in previous 24hrs  Current Nutrition: NPO; TF stopped 1/22  IVF: 1/2NS at 1545ml/hr  Central access: R IJ HD catheter TPN start date: 1/24  ASSESSMENT                                                                                                          HPI: 57 y.o. Female with gallstone pancreatitis with necrosis s/p MRCP on 1/15.  Repeat LFTs and lipase normalizing.  No plans for surgical intervention at this time.  Tube feeding was started but had to be stopped on 1/22 due to nausea/vomiting.  Pharmacy is consulted to begin TPN. Patient's course has been complicated with respiratory failure and intubation and subsequent extubation on 11/09/2017.  She was also started on CRRT from 1/18-1/23.  Significant events:   Today: 11/16/2017   Glucose: No hx DM. CBGs elevated since starting TPN but improved slightly with addition on insulin to TPN (CBG: 155-185)  Electrolytes: CoCa low 8.78, Mg elevated 2.7 (1/25), Na slightly increased 147 with restart of 1/2NS, K/Phos WNL  Renal: AKI - CRRT 1/18-1/23. SCr elevated but improving off CRRT, UOP 2075cc yesterday.  Weight decreased ~3kg overnight  LFTs: AST/ALT elevated ~2x ULN, tbili and alkphos WNL (1/23)  TGs: 213 (1/15), 332 (1/18)  Prealbumin: 6.7 (1/25)  NUTRITIONAL GOALS                                                                                             RD recs 1/22: 2270-2405 kcal, >/= 135 grams protein per day Clinimix 5/15 at a goal rate of 19410ml/hr will provide 132g/day protein, 1874Kcal/day. After 7 days, the addition of 20% fat emulsion at 3120ml/hr to provide 132g/day protein, 2354Kcal/day.   **Patient may not be able to tolerate this amount of fluid given current renal function.  PLAN  Now: calcium gluconate 1g IV x 1  At 1800 today:  Continue Clinimix 5/15 (no electrolytes d/t elevated Mg/SCr) at 19ml/hr.  Hold 20% lipid emulsion for first 7 days for ICU patients per ASPEN guidelines.  Pt now out of ICU but most recent TG =332.  Continue to hold lipids today & f/u repeat TG level tomorrow.   Plan to advance as tolerated to the goal rate -->once CBGs improved  TPN to contain standard multivitamins and trace elements.  Increase insulin in TPN to 25 units.   IVF per MD   Continue SSI q4h MODERATE scale .   TPN lab panels on Mondays & Thursdays.  F/u daily- ok to start diet per surgery, but no orders yet.   Junita Push, PharmD, BCPS Pager: [phone] 11/16/2017,8:51 AM

## 2017-11-16 NOTE — Progress Notes (Signed)
Pharmacy Antibiotic Note  Emma Washington is a 57 y.o. female admitted on 10/24/2017 with acute pancreatitis, pancreatic edema and peripancreatic fluid,  possible pancreatic necrosis.  Pharmacy consulted for Meropenem dosing.    Today, 11/16/2017:  Day #14 antibiotics  Afebrile  Renal function improving, NCrCl ~ 6335ml/min  Plan: Continue Meropenem 1g IV q8h  Follow up renal fxn, culture results, and clinical course. Duration of therapy per MD- per previous discussion plan to re-evaluate today  Height: 5\' 9"  (175.3 cm) Weight: 136 lb (61.7 kg) IBW/kg (Calculated) : 66.2  Temp (24hrs), Avg:98 F (36.7 C), Min:97.5 F (36.4 C), Max:98.6 F (37 C)  Recent Labs  Lab 11/11/17 0540  11/12/17 0417 11/13/17 0443 11/13/17 1110 11/14/17 0626 11/15/17 0249 11/16/17 0444  WBC 27.1*  --  30.3* 25.0*  --  25.0*  --  20.1*  CREATININE 1.72*   < > 1.47* 2.25* 2.32* 2.27* 2.30* 2.09*   < > = values in this interval not displayed.    Estimated Creatinine Clearance: 29.6 mL/min (A) (by C-G formula based on SCr of 2.09 mg/dL (H)).    Allergies  Allergen Reactions  . Demerol Other (See Comments)    Severe hypotension  . Meperidine     Other reaction(s): Hypotension, Hypotension (ALLERGY/intolerance)  . Other Anaphylaxis    Pineapple  . Pineapple Swelling and Anaphylaxis    Antimicrobials this admission: 1/14 Zosyn >> 1/22 1/22 Meropenem >>  Dose adjustments this admission: 1/18 adjust zosyn to 3.375g q6h for 30 min infusions with start of CVVHDF 1/23 Meropenem 1g q12h > q8h since CRRT stopped  Microbiology results: 1/14 MRSA PCR: negative 1/15 Surgical PCR: neg/neg 1/22 BCx: ngtd  Thank you for allowing pharmacy to be a part of this patient's care.  Loralee PacasErin Williamson, PharmD, BCPS Pager: [phone] 11/16/2017 9:07 AM

## 2017-11-17 DIAGNOSIS — K861 Other chronic pancreatitis: Secondary | ICD-10-CM

## 2017-11-17 LAB — CBC
HCT: 25.5 % — ABNORMAL LOW (ref 36.0–46.0)
Hemoglobin: 8.2 g/dL — ABNORMAL LOW (ref 12.0–15.0)
MCH: 30.6 pg (ref 26.0–34.0)
MCHC: 32.2 g/dL (ref 30.0–36.0)
MCV: 95.1 fL (ref 78.0–100.0)
Platelets: 559 10*3/uL — ABNORMAL HIGH (ref 150–400)
RBC: 2.68 MIL/uL — ABNORMAL LOW (ref 3.87–5.11)
RDW: 17.4 % — ABNORMAL HIGH (ref 11.5–15.5)
WBC: 20.1 10*3/uL — ABNORMAL HIGH (ref 4.0–10.5)

## 2017-11-17 LAB — PHOSPHORUS: Phosphorus: 4.1 mg/dL (ref 2.5–4.6)

## 2017-11-17 LAB — COMPREHENSIVE METABOLIC PANEL
ALT: 72 U/L — ABNORMAL HIGH (ref 14–54)
AST: 92 U/L — AB (ref 15–41)
Albumin: 1.9 g/dL — ABNORMAL LOW (ref 3.5–5.0)
Alkaline Phosphatase: 129 U/L — ABNORMAL HIGH (ref 38–126)
Anion gap: 9 (ref 5–15)
BILIRUBIN TOTAL: 0.5 mg/dL (ref 0.3–1.2)
BUN: 58 mg/dL — AB (ref 6–20)
CO2: 21 mmol/L — ABNORMAL LOW (ref 22–32)
Calcium: 7.4 mg/dL — ABNORMAL LOW (ref 8.9–10.3)
Chloride: 119 mmol/L — ABNORMAL HIGH (ref 101–111)
Creatinine, Ser: 1.81 mg/dL — ABNORMAL HIGH (ref 0.44–1.00)
GFR calc Af Amer: 35 mL/min — ABNORMAL LOW (ref >60)
GFR, EST NON AFRICAN AMERICAN: 30 mL/min — AB (ref >60)
Glucose, Bld: 169 mg/dL — ABNORMAL HIGH (ref 65–99)
POTASSIUM: 3.7 mmol/L (ref 3.5–5.1)
Sodium: 149 mmol/L — ABNORMAL HIGH (ref 135–145)
TOTAL PROTEIN: 5.8 g/dL — AB (ref 6.5–8.1)

## 2017-11-17 LAB — DIFFERENTIAL
Basophils Absolute: 0.1 10*3/uL (ref 0.0–0.1)
Basophils Relative: 0 %
EOS PCT: 0 %
Eosinophils Absolute: 0.1 10*3/uL (ref 0.0–0.7)
Lymphocytes Relative: 5 %
Lymphs Abs: 1 10*3/uL (ref 0.7–4.0)
Monocytes Absolute: 1.1 10*3/uL — ABNORMAL HIGH (ref 0.1–1.0)
Monocytes Relative: 6 %
NEUTROS PCT: 89 %
Neutro Abs: 17.9 10*3/uL — ABNORMAL HIGH (ref 1.7–7.7)

## 2017-11-17 LAB — TRIGLYCERIDES: TRIGLYCERIDES: 276 mg/dL — AB (ref <150)

## 2017-11-17 LAB — PREALBUMIN: PREALBUMIN: 9.3 mg/dL — AB (ref 18–38)

## 2017-11-17 LAB — GLUCOSE, CAPILLARY
GLUCOSE-CAPILLARY: 175 mg/dL — AB (ref 65–99)
Glucose-Capillary: 141 mg/dL — ABNORMAL HIGH (ref 65–99)
Glucose-Capillary: 146 mg/dL — ABNORMAL HIGH (ref 65–99)
Glucose-Capillary: 153 mg/dL — ABNORMAL HIGH (ref 65–99)
Glucose-Capillary: 158 mg/dL — ABNORMAL HIGH (ref 65–99)
Glucose-Capillary: 170 mg/dL — ABNORMAL HIGH (ref 65–99)

## 2017-11-17 LAB — MAGNESIUM: MAGNESIUM: 2.1 mg/dL (ref 1.7–2.4)

## 2017-11-17 MED ORDER — INSULIN REGULAR HUMAN 100 UNIT/ML IJ SOLN
INTRAMUSCULAR | Status: AC
Start: 2017-11-17 — End: 2017-11-18
  Administered 2017-11-17: 18:00:00 via INTRAVENOUS
  Filled 2017-11-17: qty 960

## 2017-11-17 MED ORDER — HALOPERIDOL LACTATE 5 MG/ML IJ SOLN
1.0000 mg | INTRAMUSCULAR | Status: DC | PRN
  Administered 2017-11-17 – 2017-11-21 (×11): 1 mg via INTRAVENOUS
  Filled 2017-11-17 (×11): qty 1

## 2017-11-17 MED ORDER — ONDANSETRON HCL 4 MG/2ML IJ SOLN
4.0000 mg | Freq: Four times a day (QID) | INTRAMUSCULAR | Status: DC | PRN
Start: 2017-11-17 — End: 2017-11-24
  Administered 2017-11-19 – 2017-11-24 (×7): 4 mg via INTRAVENOUS
  Filled 2017-11-17 (×8): qty 2

## 2017-11-17 MED ORDER — HALOPERIDOL LACTATE 5 MG/ML IJ SOLN: 1.0000 mg | INTRAMUSCULAR | Status: DC | PRN

## 2017-11-17 MED ORDER — ACETAMINOPHEN 325 MG PO TABS
650.0000 mg | ORAL_TABLET | Freq: Four times a day (QID) | ORAL | Status: DC | PRN
  Administered 2017-11-18 (×2): 650 mg via ORAL
  Filled 2017-11-17 (×3): qty 2

## 2017-11-17 NOTE — Progress Notes (Signed)
Nutrition Follow-up  DOCUMENTATION CODES:   Morbid obesity  INTERVENTION:   TPN per Pharmacy Will monitor for further diet advancement  NUTRITION DIAGNOSIS:   Inadequate oral intake related to inability to eat as evidenced by NPO status.  Now on clear liquids.  GOAL:   Patient will meet greater than or equal to 90% of their needs  Progressing.  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 Other associated symptoms include nausea, vomiting and decreased PO intake (since 1/13). Pertinent PMHx significant for nephrolithiasis.   Significant Events: 1/15- MRCP and CXR 1/18- intubated around 3:00 AM and plan for OGT placement and trickle TF initiation 1/19- CRRT started 1/20- extubated and OGT removed 1/21- Panda placed in R nare 1/22- TF stopped at 5:00 AM d/t N/V 1/23- plan to hold CRRT x1-2 days and assess for renal recovery 1/24- TPN initiation; ILE to be held x7 days 1/25: Pt pulled NGT, refused replacement  Per chart review, GI advanced pt's diet to clear liquids. TPN will continue Clinimix 5/15 @ 40 ml/hr ,not advancing today d/t diet advancement. Lipid infusion on hold d/t elevated TG levels.  Last weight taken on 1/27, suspect error. 136 lb recorded.  Medications: IV Zofran PRN Labs reviewed: CBGs: 141-158 Elevated Na Mg/Phos WNL TG: 276 mg/dL   Diet Order:  TPN (CLINIMIX) Adult without lytes Diet clear liquid Room service appropriate? Yes; Fluid consistency: Thin TPN (CLINIMIX) Adult without lytes  EDUCATION NEEDS:   No education needs have been identified at this time  Skin:  Skin Assessment: Reviewed RN Assessment  Last BM:  1/26  Height:   Ht Readings from Last 1 Encounters:  11/07/17 5\' 9"  (1.753 m)    Weight:   Wt Readings from Last 1 Encounters:  11/16/17 136 lb (61.7 kg)     Ideal Body Weight:  65.9 kg  BMI:  Body mass index is 20.08 kg/m.  Estimated Nutritional Needs:   Kcal:  [id number]   Protein:  >/= 135 grams  Fluid:  >/= 1.8 L/day  Tilda FrancoLindsey Krysta Bloomfield, MS, RD, LDN [facility] Clinical Dietitian Pager: [phone] After Hours Pager: [phone]

## 2017-11-17 NOTE — Progress Notes (Signed)
[facility] Progress Note    Since last GI note: Still with abd pains, had a rough night on new floor last night.  ++flatus.  No nausea  Objective: Vital signs in last 24 hours: Temp:  [97.5 F (36.4 C)-98.5 F (36.9 C)] 98.3 F (36.8 C) (01/28 0546) Pulse Rate:  [107-111] 108 (01/28 0546) Resp:  [18-36] 30 (01/28 0546) BP: (135-142)/(48-84) 142/84 (01/28 0546) SpO2:  [88 %-94 %] 91 % (01/28 0546) Last BM Date: 11/15/17 General: alert and oriented times 3, obese Heart: regular rate and rythm Abdomen: distended, mildly tender throughout, normal bowel sounds   Lab Results: Recent Labs    11/16/17 0444 11/17/17 0452  WBC 20.1* 20.1*  HGB 8.2* 8.2*  PLT 541* 559*  MCV 95.2 95.1   Recent Labs    11/15/17 0249 11/16/17 0444 11/17/17 0452  NA 144 147* 149*  K 4.3 3.9 3.7  CL 112* 116* 119*  CO2 24 25 21*  GLUCOSE 185* 183* 169*  BUN 62* 61* 58*  CREATININE 2.30* 2.09* 1.81*  CALCIUM 7.0* 7.1* 7.4*   Recent Labs    11/15/17 0249 11/16/17 0444 11/17/17 0452  PROT  --   --  5.8*  ALBUMIN 1.9* 1.9* 1.9*  AST  --   --  92*  ALT  --   --  72*  ALKPHOS  --   --  129*  BILITOT  --   --  0.5   Medications: Scheduled Meds: . chlorhexidine  15 mL Mouth Rinse BID  . fentaNYL  50 mcg Transdermal Q72H  . insulin aspart  0-15 Units Subcutaneous Q4H  . mouth rinse  15 mL Mouth Rinse q12n4p  . pantoprazole (PROTONIX) IV  40 mg Intravenous Q12H  . sodium chloride flush  10-40 mL Intracatheter Q12H   Continuous Infusions: . sodium chloride 50 mL/hr at 11/16/17 0600  . meropenem (MERREM) IV Stopped (11/17/17 0153)  . TPN (CLINIMIX) Adult without lytes 40 mL/hr at 11/16/17 1822   PRN Meds:.acetaminophen, albuterol, fentaNYL (SUBLIMAZE) injection, haloperidol lactate, lip balm, naLOXone (NARCAN)  injection, ondansetron (ZOFRAN) IV, promethazine, sodium chloride flush, sodium chloride flush    Assessment/Plan: 57 y.o. female with severe, necrotizing  gallstone pancreatitis, slowly recovering  MRI at admission noted a 30m stone in the CBD.  Her LFTs completely normalized within 3-4 days of admission and I think it is very possible that the small stone passed into the duodenum.  AST/ALT and alk phos trending a bit up now but this could be related to TPN.  I do not plan ERCP for her at this point, instead recommend lap chole (when this is optimal per [facility] service) and IOC.  If there is increased suspicion of retained stone prior to surgery, then will consider repeat MRI or perhaps EUS to confirm.  Cr continues to improve! She is significantly deconditioned needs PT/OT to work with her. This is a primary concern now.  She has been passing gas the past 1-2 days.  + Bowel sounds.  Trial of clear liquids today (I will order). Hope to transition at least to full liquids soon so that we can stop the TPN.    DMilus Banister MD  11/17/2017, 7:18 AM [facility] Pager [phone]

## 2017-11-17 NOTE — Progress Notes (Signed)
PHARMACY - ADULT TOTAL PARENTERAL NUTRITION CONSULT NOTE   Pharmacy Consult for TPN Indication: severe pancreatitis  Patient Measurements: Height: 5' 9" (175.3 cm) Weight: 136 lb (61.7 kg) IBW/kg (Calculated) : 66.2 TPN AdjBW (KG): 83.2 Body mass index is 20.08 kg/m.  Insulin Requirements: 18 units Novolog (moderate scale) in previous 24hrs  Current Nutrition: attempting CL diet  TF stopped 1/22  IVF: 1/2NS at 63m/hr  Central access: R IJ HD catheter TPN start date: 1/24  ASSESSMENT                                                                                                          HPI: 57y.o. Female with gallstone pancreatitis with necrosis s/p MRCP on 1/15.  Repeat LFTs and lipase normalizing.  No plans for surgical intervention at this time.  Tube feeding was started but had to be stopped on 1/22 due to nausea/vomiting.  Pharmacy is consulted to begin TPN. Patient's course has been complicated with respiratory failure and intubation and subsequent extubation on 11/09/2017.  She was also started on CRRT from 1/18-1/23.  Significant events:  1/28: CL diet  Today: 11/17/2017   Glucose: No hx DM. CBGs elevated since starting TPN but improved slightly with addition on insulin to TPN (CBG: avg 160). Clear liquid diet will increase CBG's dependent on intake  Electrolytes: CoCa 9, Mg 2.1, Na slightly increased 149 with restart of 1/2NS, K/Phos WNL  Renal: AKI - CRRT 1/18-1/23. SCr elevated but improving off CRRT, UOP 1600cc yesterday.    LFTs: AST/ALT elevated ~2x ULN, Alk phos increasing above NL, Tbili WNL  TGs: 213 (1/15), 332 (1/18), improving 276 (1/28)  Prealbumin: 6.7 (1/25), 9.3 (1/28)  NUTRITIONAL GOALS                                                                                             RD recs 1/22: 2270-2405 kcal, >/= 135 grams protein per day Clinimix 5/15 at a goal rate of 1132mhr will provide 132g/day protein, 1874Kcal/day. After 7 days, the  addition of 20% fat emulsion at 2055mr to provide 132g/day protein, 2354Kcal/day.  **Patient may not be able to tolerate this amount of fluid given current renal function.  PLAN  Starting clear liquid diet  At 1800 today:  Continue Clinimix 5/15 (no electrolytes d/t elevated Mg/SCr) at 44m/hr.  Hold 20% lipid emulsion for first 7 days for ICU patients per ASPEN guidelines.  Pt now out of ICU most recent TG = 276.  Continue to hold lipids with pancreatitis, and advancing diet   Holding on TPN rate advancement, diet started  TPN to contain standard multivitamins and trace elements. Increase Insulin in TPN to 30 units.   IVF per MD   Continue SSI q4h MODERATE scale .   TPN lab panels on Mondays & Thursdays.  F/u daily.   GMinda DittoPharmD Pager [phone] 18971/28/2019, 10:20 AM

## 2017-11-17 NOTE — Clinical Social Work Note (Signed)
Clinical Social Work Assessment  Patient Details  Name: Emma Washington MRN: [id number] Date of Birth: 09/24/1961  Date of referral:  11/17/17               Reason for consult:  Facility Placement                Permission sought to share information with:  Family Supports, Chartered certified accountant granted to share information::  Yes, Verbal Permission Granted  Name::        Agency::     Relationship::     Contact Information:     Housing/Transportation Living arrangements for the past 2 months:  Single Family Home Source of Information:  Patient(Friend at Bedside) Patient Interpreter Needed:  None Criminal Activity/Legal Involvement Pertinent to Current Situation/Hospitalization:  Yes Significant Relationships:  Adult Children Lives with:  Self Do you feel safe going back to the place where you live?  No Need for family participation in patient care:  No   Care giving concerns:  PT evaluation-SNF placement for short rehab.    Social Worker assessment / plan: CSW met with patient and friend at bedside, explained role and reason for visit- to discuss discharge planning to SNF.  Patient friend reports this is the patient first time going to SNF for rehab. 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 Patient works full-time and independent prior to hospitalization.  CSW explain SNF process and later following up with bed offers. Patient provided CSW with list of facilities she is interested in at this time.   Plan: SNF   Employment status:  [employer] information:    PT Recommendations:  Skilled Nursing Facility(BCBS) Information / Referral to community resources:  [facility]  Patient/Family's Response to care:  Patient appreciative of CSW services.   Patient/Family's Understanding of and Emotional Response to Diagnosis, Current Treatment, and Prognosis: Patient is knowledgeable of her diagnosis and care plan.   Patient reports having a supportive brother and friend as well.    Emotional Assessment Appearance:  Appears stated age Attitude/Demeanor/Rapport:    Affect (typically observed):  Accepting Orientation:  Oriented to Self, Oriented to Place, Oriented to  Time, Oriented to Situation Alcohol / Substance use:  Not Applicable Psych involvement (Current and /or in the community):  No (Comment)  Discharge Needs  Concerns to be addressed:  Discharge Planning Concerns, Decision making concerns Readmission within the last 30 days:  No Current discharge risk:  None Barriers to Discharge:  Ship broker, Continued Medical Work up   [employer], [place] 11/17/2017, 3:26 PM

## 2017-11-17 NOTE — NC FL2 (Signed)
[place] MEDICAID FL2 LEVEL OF CARE SCREENING TOOL     IDENTIFICATION  Patient Name: Emma Washington Birthdate: June 06, 1961 Sex: female Admission Date (Current Location): 04-Nov-2017  [facility] and [place] Number:  Producer, television/film/video and Address:  [facility],  [address] [place], [place] [address]      Provider Number: [id number]  Attending Physician Name and Address:  Standley Brooking, MD  Relative Name and Phone Number:       Current Level of Care: SNF Recommended Level of Care: Skilled Nursing Facility Prior Approval Number:    Date Approved/Denied:   PASRR Number:    Discharge Plan: SNF    Current Diagnoses: Patient Active Problem List   Diagnosis Date Noted  . Anemia 11/12/2017  . Obesity, Class III, BMI 40-49.9 (morbid obesity) (HCC) 11/12/2017  . AKI (acute kidney injury) (HCC)   . Hypocalcemia 11/05/2017  . Pancreatic necrosis 11/05/2017  . Gallstone pancreatitis 11/04/2017    Orientation RESPIRATION BLADDER Height & Weight     Self, Time, Situation, Place  Normal Continent, External catheter Weight: 136 lb (61.7 kg) Height:  5\' 9"  (175.3 cm)  BEHAVIORAL SYMPTOMS/MOOD NEUROLOGICAL BOWEL NUTRITION STATUS      Continent Diet(See Discharge Summary )  AMBULATORY STATUS COMMUNICATION OF NEEDS Skin   Extensive Assist Verbally Normal                       Personal Care Assistance Level of Assistance  Bathing, Feeding, Dressing Bathing Assistance: Limited assistance Feeding assistance: Independent Dressing Assistance: Limited assistance     Functional Limitations Info  Sight, Hearing, Speech Sight Info: Adequate Hearing Info: Adequate Speech Info: Adequate    SPECIAL CARE FACTORS FREQUENCY  PT (By licensed PT), OT (By licensed OT)     PT Frequency: 5X/week OT Frequency: 5X/week            Contractures Contractures Info: Not present    Additional Factors Info  Code Status, Allergies, Insulin Sliding Scale Code Status Info:  Fullcode Allergies Info: Allergies: Demerol, Meperidine, Other, Pineapple   Insulin Sliding Scale Info: 0-15 units every 4 hours       Current Medications (11/17/2017):  This is the current hospital active medication list Current Facility-Administered Medications  Medication Dose Route Frequency Provider Last Rate Last Dose  . 0.45 % sodium chloride infusion   Intravenous Continuous Standley Brooking, MD 50 mL/hr at 11/17/17 1403    . acetaminophen (TYLENOL) tablet 650 mg  650 mg Oral Q6H PRN Standley Brooking, MD      . albuterol (PROVENTIL) (2.5 MG/3ML) 0.083% nebulizer solution 2.5 mg  2.5 mg Nebulization Q4H PRN Leslye Peer, MD   2.5 mg at 11/07/17 0243  . chlorhexidine (PERIDEX) 0.12 % solution 15 mL  15 mL Mouth Rinse BID Kalman Shan, MD   15 mL at 11/15/17 0859  . fentaNYL (DURAGESIC - dosed mcg/hr) 50 mcg  50 mcg Transdermal Q72H Rayburn, Kelly A, PA-C   50 mcg at 11/17/17 0912  . fentaNYL (SUBLIMAZE) injection 25 mcg  25 mcg Intravenous Q2H PRN Rayburn, Kelly A, PA-C   25 mcg at 11/17/17 0859  . haloperidol lactate (HALDOL) injection 1 mg  1 mg Intravenous Q4H PRN Standley Brooking, MD      . insulin aspart (novoLOG) injection 0-15 Units  0-15 Units Subcutaneous Q4H Phylliss Blakes, [facility]   2 Units at 11/17/17 1255  . lip balm (CARMEX) ointment   Topical PRN Byrum,  Les Pouobert S, MD      . MEDLINE mouth rinse  15 mL Mouth Rinse q12n4p Kalman Shanamaswamy, Murali, MD   15 mL at 11/17/17 1205  . naloxone [facility](NARCAN) injection 0.4 mg  0.4 mg Intravenous PRN Albertine GratesXu, Fang, MD      . ondansetron [facility](ZOFRAN) injection 4 mg  4 mg Intravenous Q6H PRN Standley BrookingGoodrich, Daniel P, MD      . pantoprazole (PROTONIX) injection 40 mg  40 mg Intravenous Cathie HoopsQ12H Xu, Fang, MD   40 mg at 11/17/17 0901  . sodium chloride flush (NS) 0.9 % injection 10-40 mL  10-40 mL Intracatheter Q12H Standley BrookingGoodrich, Daniel P, MD   10 mL at 11/16/17 2245  . sodium chloride flush (NS) 0.9 % injection 10-40 mL  10-40 mL Intracatheter PRN Standley BrookingGoodrich,  Daniel P, MD      . sodium chloride flush (NS) 0.9 % injection 10-40 mL  10-40 mL Intracatheter PRN Standley BrookingGoodrich, Daniel P, MD      . TPN [facility](CLINIMIX) Adult without lytes   Intravenous Continuous TPN Phylliss BlakesLilliston, Andrea M, RPH 40 mL/hr at 11/16/17 [id number]    . TPN (CLINIMIX) Adult without lytes   Intravenous Continuous TPN Otho BellowsGreen, Terri L, [facility] CenterRPH         Discharge Medications: Please see discharge summary for a list of discharge medications.  Relevant Imaging Results:  Relevant Lab Results:   Additional Information SSN:[id number]  Clearance CootsNicole A Bassam Dresch, LCSW

## 2017-11-17 NOTE — Progress Notes (Signed)
Subjective/Chief Complaint: Passing a lot of flatus, no n/v some abd pain   Objective: Vital signs in last 24 hours: Temp:  [98.2 F (36.8 C)-98.5 F (36.9 C)] 98.3 F (36.8 C) (01/28 0546) Pulse Rate:  [107-111] 108 (01/28 0546) Resp:  [18-36] 30 (01/28 0546) BP: (135-142)/(48-84) 142/84 (01/28 0546) SpO2:  [88 %-94 %] 91 % (01/28 0546) Last BM Date: 11/15/17  Intake/Output from previous day: 01/27 0701 - 01/28 0700 In: 1323 [I.V.:1013; IV Piggyback:310] Out: 2125 [Urine:2125] Intake/Output this shift: Total I/O In: -  Out: 300 [Urine:300]  GI: mildly tender upper abdomen, bs present obese  Lab Results:  Recent Labs    11/16/17 0444 11/17/17 0452  WBC 20.1* 20.1*  HGB 8.2* 8.2*  HCT 25.8* 25.5*  PLT 541* 559*   BMET Recent Labs    11/16/17 0444 11/17/17 0452  NA 147* 149*  K 3.9 3.7  CL 116* 119*  CO2 25 21*  GLUCOSE 183* 169*  BUN 61* 58*  CREATININE 2.09* 1.81*  CALCIUM 7.1* 7.4*   PT/INR No results for input(s): LABPROT, INR in the last 72 hours. ABG No results for input(s): PHART, HCO3 in the last 72 hours.  Invalid input(s): PCO2, PO2  Studies/Results: No results found.  Anti-infectives: Anti-infectives (From admission, onward)   Start     Dose/Rate Route Frequency Ordered Stop   11/12/17 1800  meropenem (MERREM) 1 g in sodium chloride 0.9 % 100 mL IVPB  Status:  Discontinued     1 g 200 mL/hr over 30 Minutes Intravenous Every 8 hours 11/12/17 1115 11/17/17 0954   11/11/17 1000  meropenem (MERREM) 1 g in sodium chloride 0.9 % 100 mL IVPB  Status:  Discontinued     1 g 200 mL/hr over 30 Minutes Intravenous Every 12 hours 11/11/17 0911 11/12/17 1115   11/09/17 1000  piperacillin-tazobactam (ZOSYN) IVPB 3.375 g  Status:  Discontinued     3.375 g 12.5 mL/hr over 240 Minutes Intravenous Every 8 hours 11/09/17 0614 11/09/17 0615   11/09/17 1000  piperacillin-tazobactam (ZOSYN) IVPB 3.375 g  Status:  Discontinued     3.375 g 100 mL/hr  over 30 Minutes Intravenous Every 6 hours 11/09/17 0616 11/11/17 0911   11/07/17 2200  piperacillin-tazobactam (ZOSYN) IVPB 2.25 g  Status:  Discontinued     2.25 g 100 mL/hr over 30 Minutes Intravenous Every 8 hours 11/07/17 1218 11/07/17 1741   11/07/17 2200  piperacillin-tazobactam (ZOSYN) IVPB 3.375 g  Status:  Discontinued     3.375 g 12.5 mL/hr over 240 Minutes Intravenous Every 6 hours 11/07/17 1741 11/07/17 1747   11/07/17 2200  piperacillin-tazobactam (ZOSYN) IVPB 3.375 g  Status:  Discontinued     3.375 g 100 mL/hr over 30 Minutes Intravenous Every 6 hours 11/07/17 1747 11/09/17 0613   11/04/17 1100  piperacillin-tazobactam (ZOSYN) IVPB 3.375 g  Status:  Discontinued     3.375 g 12.5 mL/hr over 240 Minutes Intravenous Every 8 hours 11/04/17 1059 11/07/17 1218   10/29/2017 1200  piperacillin-tazobactam (ZOSYN) IVPB 3.375 g     3.375 g 100 mL/hr over 30 Minutes Intravenous  Once 10/21/2017 1157 11/18/2017 1309      Assessment/Plan: Necrotizing pancreatitis secondary to gallstones  Agree with Dr Christella HartiganJacobs note.  Will need lap chole at some point but would not do this admission due to severity of pancreatitis.. She does not need surgery for nec pancreatitis right now either.  Will see later this week and eventually can just come back to  see [place] in office  Emelia Loron 11/17/2017

## 2017-11-17 NOTE — Evaluation (Signed)
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 CT suggesting acute necrotizing pancreatitis with 1 mm stone in the left kidney. Pt developed VDRF, weaned 11/09/17, also was on CRRT.    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 She will benefit from acute OT with follow up OT at SNF.    Follow Up Recommendations  SNF;Supervision/Assistance - 24 hour    Equipment Recommendations  Other (comment)(TBD at next venue)       Precautions / Restrictions Precautions Precautions: Fall Precaution Comments: monitor O2 (94% at rest, 90-91% with activity)      Mobility Bed Mobility Overal bed mobility: Needs Assistance Bed Mobility: Rolling;Sidelying to Sit;Sit to Sidelying Rolling: Mod assist(use of rail) Sidelying to sit: Mod assist;HOB elevated(use of rail)     Sit to sidelying: Min assist(HOB flat and use of rail) General bed mobility comments: VCs for sequencing  Transfers                 General transfer comment: unable to stand due to pain    Balance Overall balance assessment: Needs assistance Sitting-balance support: Feet supported;Bilateral upper extremity supported Sitting balance-Leahy Scale: Poor Sitting balance - Comments: reliant on Bil UE support due to pain                                   ADL either performed or assessed with clinical judgement   ADL Overall ADL's : Needs assistance/impaired Eating/Feeding: Independent;Bed level   Grooming: Set up;Bed level   Upper Body Bathing: Set up;Bed level   Lower Body Bathing: Total assistance;Bed level   Upper Body Dressing : Moderate assistance;Bed level   Lower Body Dressing: Total assistance;Bed level                       Vision Patient Visual Report: No change from baseline              Pertinent Vitals/Pain Pain Assessment: 0-10 Pain Score: 9  Pain Location: left low back Pain Descriptors / Indicators: Aching;Sore Pain Intervention(s): Limited activity within patient's tolerance;Monitored during session;Repositioned;Heat applied     Hand Dominance  right   Extremity/Trunk Assessment Upper Extremity Assessment Upper Extremity Assessment: Overall WFL for tasks assessed           Communication  no issues   Cognition Arousal/Alertness: Awake/alert Behavior During Therapy: WFL for tasks assessed/performed Overall Cognitive Status: Within Functional Limits for tasks assessed                                                Home Living Family/patient expects to be discharged to:: Skilled nursing facility  OT Problem List: Decreased activity tolerance;Impaired balance (sitting and/or standing);Pain;Obesity         OT Goals(Current goals can be found in the care plan section) Acute Rehab OT Goals Patient Stated Goal: to rehab and then home OT Goal Formulation: With patient Time For Goal Achievement: 12/01/17 Potential to Achieve Goals: Good  OT Frequency:                AM-PAC PT "6 Clicks" Daily Activity     Outcome Measure Help from another person eating meals?: None Help from another person taking care of personal grooming?: A Little Help from another person toileting, which includes using toliet, bedpan, or urinal?: Total Help from another person bathing (including washing, rinsing, drying)?: A Lot Help from another person to put on and  taking off regular upper body clothing?: A Lot Help from another person to put on and taking off regular lower body clothing?: Total 6 Click Score: 13   End of Session    Activity Tolerance: Patient limited by pain Patient left: in bed;with call bell/phone within reach;with family/visitor present  OT Visit Diagnosis: Other abnormalities of gait and mobility (R26.89);Pain Pain - part of body: (left lower back)                Time: 1012-1040 OT Time Calculation (min): 28 min Charges:  OT General Charges $OT Visit: 1 Visit OT Evaluation $OT Eval Moderate Complexity: 1 Mod OT Treatments $Self Care/Home Management : 8-22 mins Ignacia PalmaCathy Grier Vu, OTR/L [phone] 11/17/2017

## 2017-11-17 NOTE — Progress Notes (Addendum)
PROGRESS NOTE  Emma Washington ZOX:[id number] DOB: 1961/05/04 DOA: 2018-03-03 PCP: No primary care provider on file.  Summary 56yow presented with flank pain admitted for acute pancreatitis and GI evaluation. Further imaging suggested pancreatic necrosis and patient was started on empiric antibiotic. Respiratory distress prompted transfer to PCCM. subsequently required intubation. Developed ARF prompting nephrology consultation and initiation of CRRT. [facility]ub[facility] and [facility]. CRRT stopped and renal function recovering. Started on liquid diet. Slowly improving.  Assessment/Plan  Progressive severe necrotizing gallstone pancreatitis with inflammatory changes as seen on CT abd/pelvis 1/22.  - continues to clinically improve. Completed abx 1/27 - GI started liquid diet. Not planning on ERCP for now - continue analgesia PRN  AKI. - creatinine continues to improve. Excellent UOP.  Anemia of critical illness - Hgb remains without change  Hypocalcemia - resolved s/p repletion  Acute hypoxic resp failure - had improved yesterday, respiratory status appears stable. Will wean again today. Suspect component OHS.  S/p VDRF  Moderate hepatic steatosis with chronic elevation of transaminases  Morbid obesity  - per nutrition  Nutrition - TNA per pharmacy 1/28  Continues to improve.  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 Tolerating liquids thus far as ordered by GI today. Reports haloperidol works best for nausea. Breathing fine.  Objective: Vitals:  Vitals:   11/17/17 1158 11/17/17 1159  BP: 140/76   Pulse: (!) 104   Resp: (!) 28   Temp: 98.5 F (36.9 C)   SpO2: 91% 93%    Exam: Constitutional:   . Appears calm and comfortable Respiratory:  . CTA bilaterally, no w/r/r.  . Respiratory effort normal.  Cardiovascular:  . RRR, no m/r/g . No LE extremity edema   Psychiatric:  . Mental status o Mood, affect appropriate  I have personally reviewed the following:  Filed Weights   11/14/17 0500 11/15/17 0451 11/16/17 0530  Weight: 134.3 kg (296 lb 1.2 oz) (!) 139.5 kg (307 lb 8.7 oz) 61.7 kg (136 lb)   Weight change:   UOP: 2125 I/O since admission: +5.8 L Last BM charted: 1/22 but on TNA Foley: yes Telemetry: no Status: med surg  Labs:  CBG remains stable  Creatinine  2.32 >> 2.27 >> 2.3 >> 2.09 >> 1.81  Albumin 1.9 again today  WBC 27.1 >> 30.3 >> 25.0 >> 25.0 >> 20.1 >> 20.1  Hgb 10.4 >> 9.7 >> 9.0 >> 8.3 >> 8.2 >> 8.2  Calcium 6.6 >> 6.8 >> 6.7 >> 7.0 uncorrected, corrected 9.1  Scheduled Meds: . chlorhexidine  15 mL Mouth Rinse BID  . fentaNYL  50 mcg Transdermal Q72H  . insulin aspart  0-15 Units Subcutaneous Q4H  . mouth rinse  15 mL Mouth Rinse q12n4p  . pantoprazole (PROTONIX) IV  40 mg Intravenous Q12H  . sodium chloride flush  10-40 mL Intracatheter Q12H   Continuous Infusions: . sodium chloride 50 mL/hr at 11/16/17 0600  .  TPN (CLINIMIX) Adult without lytes 40 mL/hr at 11/16/17 1822  . TPN (CLINIMIX) Adult without lytes      Principal Problem:   Gallstone pancreatitis Active Problems:   Hypocalcemia   Pancreatic necrosis   AKI (acute kidney injury) (HCC)   Anemia   Obesity, Class III, BMI 40-49.9 (morbid obesity) (HCC)   LOS: 14 days

## 2017-11-17 NOTE — Progress Notes (Signed)
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 CT suggesting acute necrotizing pancreatitis with 1 mm stone in the left kidney. Pt developed VDRF, weaned 11/09/17, also was on CRRT.     PT Comments    Pt required MAX encouragement to participate stating she has not been OOB "since I got here".  Assisted to EOB required much effort and immediate c/o dizziness, dyspnea and nausea.  RA 96%, HR 109 and BP 138/88.  RR was in high 30's with 10/10 pain and anxiety when asked if she could stand.  Attempted three times, however pt uanble to rise up enough to clear hips off bed.  Increased anxiety pt quickly stated "I need to lay down".  Required much effort to position to comfort in bed.  Pt deconditioned and edematous throughout.   .    Follow Up Recommendations  SNF     Equipment Recommendations       Recommendations for Other Services       Precautions / Restrictions Precautions Precautions: Fall Restrictions Weight Bearing Restrictions: No    Mobility  Bed Mobility Overal bed mobility: Needs Assistance Bed Mobility: Rolling;Sidelying to Sit;Sit to Supine   Sidelying to sit: Mod assist;HOB elevated   Sit to supine: Mod assist   General bed mobility comments: increased time and several attempts to complete  Transfers   Equipment used: Rolling walker (2 wheeled);None             General transfer comment: attempted sit to stand several times however pt unable to fully rise from bed.  "I'm too weak" and "a lot of pain".  also with c/o nausea with activity.   Ambulation/Gait             General Gait Details: unable to attempt   Stairs            Wheelchair Mobility    Modified Rankin (Stroke Patients Only)       Balance                                             Cognition Arousal/Alertness: Awake/alert Behavior During Therapy: Anxious Overall Cognitive Status: Within Functional Limits for tasks assessed                                        Exercises      General Comments        Pertinent Vitals/Pain Pain Score: 10-Worst pain ever Pain Location: left low back Pain Descriptors / Indicators: Aching;Sore;Grimacing Pain Intervention(s): Monitored during session;Repositioned    Home Living                      Prior Function            PT Goals (current goals can now be found in the care plan section) Progress towards PT goals: Progressing toward goals    Frequency    Min 3X/week      PT Plan      Co-evaluation              AM-PAC PT "6 Clicks" Daily Activity  Outcome Measure  Difficulty turning over in bed (including adjusting bedclothes, sheets and blankets)?: Unable Difficulty moving from lying on back to sitting on the side of the bed? : Unable Difficulty sitting down on and standing up from a chair with arms (e.g., wheelchair, bedside commode, etc,.)?: Unable Help needed moving to and from a bed to chair (including a wheelchair)?: Total Help needed walking in hospital room?: Total Help needed climbing 3-5 steps with a railing? : Total 6 Click Score: 6    End of Session Equipment Utilized During Treatment: Gait belt Activity Tolerance: Patient limited by pain;Treatment limited secondary to medical complications (Comment) Patient left: in bed;with call bell/phone within reach;with nursing/sitter in room Nurse Communication: Mobility status;Patient requests pain meds PT Visit Diagnosis: Muscle weakness (generalized) (M62.81);Difficulty in walking, not elsewhere classified (R26.2);Dizziness and giddiness (R42);Pain     Time: [id number] PT Time Calculation (min) (ACUTE ONLY): 24 min  Charges:  $Therapeutic Activity: 23-37 mins                    G Codes:        Felecia ShellingLori Shaletta Hinostroza  PTA [facility]  Acute  Rehab Pager      [phone]

## 2017-11-18 LAB — BASIC METABOLIC PANEL
Anion gap: 5 (ref 5–15)
BUN: 56 mg/dL — ABNORMAL HIGH (ref 6–20)
CO2: 23 mmol/L (ref 22–32)
Calcium: 7.6 mg/dL — ABNORMAL LOW (ref 8.9–10.3)
Chloride: 117 mmol/L — ABNORMAL HIGH (ref 101–111)
Creatinine, Ser: 1.79 mg/dL — ABNORMAL HIGH (ref 0.44–1.00)
GFR calc Af Amer: 36 mL/min — ABNORMAL LOW (ref >60)
GFR, EST NON AFRICAN AMERICAN: 31 mL/min — AB (ref >60)
Glucose, Bld: 162 mg/dL — ABNORMAL HIGH (ref 65–99)
POTASSIUM: 3.7 mmol/L (ref 3.5–5.1)
SODIUM: 145 mmol/L (ref 135–145)

## 2017-11-18 LAB — GLUCOSE, CAPILLARY
GLUCOSE-CAPILLARY: 195 mg/dL — AB (ref 65–99)
Glucose-Capillary: 144 mg/dL — ABNORMAL HIGH (ref 65–99)
Glucose-Capillary: 158 mg/dL — ABNORMAL HIGH (ref 65–99)
Glucose-Capillary: 171 mg/dL — ABNORMAL HIGH (ref 65–99)
Glucose-Capillary: 172 mg/dL — ABNORMAL HIGH (ref 65–99)
Glucose-Capillary: 167 mg/dL — ABNORMAL HIGH (ref 65–99)

## 2017-11-18 MED ORDER — TRACE MINERALS CR-CU-MN-SE-ZN 10-1000-500-60 MCG/ML IV SOLN
INTRAVENOUS | Status: AC
  Administered 2017-11-18: 18:00:00 via INTRAVENOUS
  Filled 2017-11-18: qty 960

## 2017-11-18 NOTE — Progress Notes (Signed)
[facility] Progress Note    Since last GI note: Tolerated clears without n/vomiting or significant worsening of her abd pains.  Continues ++flatus.    Objective: Vital signs in last 24 hours: Temp:  [97.7 F (36.5 C)-99 F (37.2 C)] 97.7 F (36.5 C) (01/29 0432) Pulse Rate:  [101-107] 101 (01/29 0432) Resp:  [20-28] 22 (01/29 0432) BP: (137-176)/(64-76) 176/71 (01/29 0432) SpO2:  [91 %-96 %] 92 % (01/29 0432) Weight:  [305 lb 8.9 oz (138.6 kg)] 305 lb 8.9 oz (138.6 kg) (01/29 0432) Last BM Date: 11/16/17 General: alert and oriented times 3 Heart: regular rate and rythm Abdomen: soft, mildly tender throughout,  non-distended, normal bowel sounds   Lab Results: Recent Labs    11/16/17 0444 11/17/17 0452  WBC 20.1* 20.1*  HGB 8.2* 8.2*  PLT 541* 559*  MCV 95.2 95.1   Recent Labs    11/16/17 0444 11/17/17 0452 11/18/17 0517  NA 147* 149* 145  K 3.9 3.7 3.7  CL 116* 119* 117*  CO2 25 21* 23  GLUCOSE 183* 169* 162*  BUN 61* 58* 56*  CREATININE 2.09* 1.81* 1.79*  CALCIUM 7.1* 7.4* 7.6*   Recent Labs    11/16/17 0444 11/17/17 0452  PROT  --  5.8*  ALBUMIN 1.9* 1.9*  AST  --  92*  ALT  --  72*  ALKPHOS  --  129*  BILITOT  --  0.5    Medications: Scheduled Meds: . chlorhexidine  15 mL Mouth Rinse BID  . fentaNYL  50 mcg Transdermal Q72H  . insulin aspart  0-15 Units Subcutaneous Q4H  . mouth rinse  15 mL Mouth Rinse q12n4p  . pantoprazole (PROTONIX) IV  40 mg Intravenous Q12H  . sodium chloride flush  10-40 mL Intracatheter Q12H   Continuous Infusions: . sodium chloride 50 mL/hr at 11/17/17 1403  . TPN (CLINIMIX) Adult without lytes 40 mL/hr at 11/17/17 1821   PRN Meds:.acetaminophen, albuterol, fentaNYL (SUBLIMAZE) injection, haloperidol lactate, lip balm, naLOXone (NARCAN)  injection, ondansetron (ZOFRAN) IV, sodium chloride flush, sodium chloride flush    Assessment/Plan: 57 y.o. female with severe necrotizing gallstone  pancreatitis, slowly recovering.  Greatly appreciate OT/PT help with her. She is very deconditioned from this 2 week acute illness.  Would like to remove her foley in next 1-2 days if she is strong enough to do without it.  She tolerated clear liquids, will advance to full liquids today.  Hoping that she will not need TPN after another 2-3 days.  No plans for ERCP at this point.  I understand the surgical plan is for her to rehabilitate prior to cholecystectomy and I agree with that plan.  If IOC during the eventual cholecystectomy shows CBD stones, then ERCP post operatively. If there is increasing concern for CBD stone prior to surgery, she will need repeat imaging (MRI, EUS to interrogate the bile duct).   Rachael Feeaniel P Jacobs, MD  11/18/2017, 7:59 AM  Gastroenterology Pager [phone]

## 2017-11-18 NOTE — Progress Notes (Signed)
PROGRESS NOTE  Emma Washington QIO:[id number] DOB: [birth year] DOA: 11/07/2017 PCP: No primary care provider on file.  Summary 56yow presented with flank pain admitted for acute pancreatitis and GI evaluation. Further imaging suggested pancreatic necrosis and patient was started on empiric antibiotic. Respiratory distress prompted transfer to PCCM. subsequently required intubation. Developed ARF prompting nephrology consultation and initiation of CRRT. [facility] CRRT stopped and renal function recovering. Started on liquid diet. Slowly improving.  Assessment/Plan  Progressive severe necrotizing gallstone pancreatitis with inflammatory changes as seen on CT abd/pelvis 1/22.  - continues to improve. Completed abx 1/27 - GI has advanced diet to full liquids. Not planning on ERCP for now. Outpatient cholecystectomy anticipated after recovery. - continue analgesia PRN  AKI. - creatinine continues to improve slowly. AdequateUOP.  Anemia of critical illness - check CBC in AM  Hypocalcemia - resolved s/p repletion  Acute hypoxic resp failure - improved, intermittent need. Suspect component OHS.  S/p VDRF  Moderate hepatic steatosis with chronic elevation of transaminases  Morbid obesity  - per nutrition  Nutrition - TNA per pharmacy 1/29  Improving. Hopefully can stop TNA in a few days  DVT prophylaxis: SCDs Code Status: full Family Communication: none Disposition Plan: SNF per PT   Brendia Sacksaniel Carmello Cabiness, MD  [facility] Direct contact: [phone] --Via amion app OR  --[email or web address]; password [email or web address]  7PM-7AM contact night coverage as above 11/18/2017, 5:24 PM  LOS: 15 days   Consultants:  GI  PCCM  Nephrology   General surgery  Procedures:  ETT 1/18 >> 1/20  Hemodialysis catheter 1/18 >> 1/26  CRRT 1/19 >> 1/23  PICC 1/26 >>  Antimicrobials:  Meropenem 1/23 >> 1/27  Zosyn 1/14 >> 1/22  Interval history/Subjective: Tolerating  diet. Some nausea and mild abdominal pain. More SOB today.  Objective: Vitals:  Vitals:   11/18/17 1402 11/18/17 1430  BP: (!) 145/69   Pulse: 100 96  Resp: (!) 36 (!) 26  Temp: 97.6 F (36.4 C)   SpO2: 93% 96%    Exam:  Constitutional:   . Appears calm and comfortable Eyes:  . pupils and irises appear normal . Normal lids  ENMT:  . grossly normal hearing  . Lips appear normal Respiratory:  . CTA bilaterally, no w/r/r except focal wheeze left chest . Respiratory effort mildly increased Cardiovascular:  . RRR, no m/r/g . No LE extremity edema   Abdomen:  . Obese, soft Psychiatric:  . Mental status o Mood, affect appropriate  I have personally reviewed the following:  Filed Weights   11/15/17 0451 11/16/17 0530 11/18/17 0432  Weight: (!) 139.5 kg (307 lb 8.7 oz) 61.7 kg (136 lb) (!) 138.6 kg (305 lb 8.9 oz)   Weight change:   UOP: 1800 I/O since admission: +4.2 L Last BM charted: 1/22 but on TNA Foley: yes Telemetry: no Status: med surg  Labs:  CBG remains stable 1/29  Creatinine  2.32 >> 2.27 >> 2.3 >> 2.09 >> 1.81 > 1.79  WBC 27.1 >> 30.3 >> 25.0 >> 25.0 >> 20.1 >> 20.1 >  Hgb 10.4 >> 9.7 >> 9.0 >> 8.3 >> 8.2 >> 8.2 >  Scheduled Meds: . chlorhexidine  15 mL Mouth Rinse BID  . fentaNYL  50 mcg Transdermal Q72H  . insulin aspart  0-15 Units Subcutaneous Q4H  . mouth rinse  15 mL Mouth Rinse q12n4p  . pantoprazole (PROTONIX) IV  40 mg Intravenous Q12H  . sodium chloride flush  10-40 mL Intracatheter Q12H  Continuous Infusions: . sodium chloride 50 mL/hr at 11/18/17 1003  . TPN (CLINIMIX) Adult without lytes 40 mL/hr at 11/17/17 1821  . TPN (CLINIMIX) Adult without lytes      Principal Problem:   Gallstone pancreatitis Active Problems:   Hypocalcemia   Pancreatic necrosis   AKI (acute kidney injury) (HCC)   Anemia   Obesity, Class III, BMI 40-49.9 (morbid obesity) (HCC)   LOS: 15 days

## 2017-11-18 NOTE — Progress Notes (Signed)
Emma Washington - ADULT TOTAL PARENTERAL NUTRITION CONSULT NOTE   Emma Washington Consult for TPN Indication: severe pancreatitis  Patient Measurements: Height: '5\' 9"'  (175.3 cm) Weight: (!) 305 lb 8.9 oz (138.6 kg) IBW/kg (Calculated) : 66.2 TPN AdjBW (KG): 83.2 Body mass index is 45.12 kg/m.  Insulin Requirements: 22 units Novolog (moderate scale) in previous 24hrs CL diet likely increased CBGs  Current Nutrition: advancing to [facility] diet  TF stopped 1/22  IVF: 1/2NS at 30m/hr  Central access: R IJ HD catheter TPN start date: 1/24  ASSESSMENT                                                                                                          HPI: 57y.o. Female with gallstone pancreatitis with necrosis s/p MRCP on 1/15.  Repeat LFTs and lipase normalizing.  No plans for surgical intervention at this time.  Tube feeding was started but had to be stopped on 1/22 due to nausea/vomiting.  Emma Washington is consulted to begin TPN. Patient's course has been complicated with respiratory failure and intubation and subsequent extubation on 11/09/2017.  She was also started on CRRT from 1/18-1/23.  Significant events:  1/28: CL diet 1/29: FL diet  Today: 11/18/2017   Glucose: No hx DM. CBGs elevated since starting TPN but improved slightly with addition on insulin to TPN (CBG: avg 160). Full liquid diet will increase CBG's dependent on intake  Electrolytes: CoCa 9, Na 145 with restart of 1/2NS, K wnl  Renal: AKI - CRRT 1/18-1/23. SCr elevated but improving off CRRT, UOP 2450cc yesterday.    LFTs: AST/ALT elevated ~2x ULN, Alk phos increasing above NL, Tbili WNL  TGs: 213 (1/15), 332 (1/18), improving 276 (1/28)  Prealbumin: 6.7 (1/25), 9.3 (1/28)  NUTRITIONAL GOALS                                                                                             RD recs 1/22: 2270-2405 kcal, >/= 135 grams protein per day Clinimix 5/15 at a goal rate of 1148mhr will provide 132g/day protein,  1874Kcal/day. After 7 days, the addition of 20% fat emulsion at 2079mr to provide 132g/day protein, 2354Kcal/day.  **Patient may not be able to tolerate this amount of fluid given current renal function.  PLAN  At 1800 today:  Continue Clinimix 5/15 (no electrolytes d/t elevated Mg/SCr) at 25m/hr.  Hold 20% lipid emulsion for first 7 days for ICU patients per ASPEN guidelines.  Pt now out of ICU most recent TG = 276.  Continue to hold lipids with pancreatitis, and advancing diet   Holding on TPN rate advancement, diet being advanced  Anticipate d/c of TPN tomorrow with tolerance of FL diet.   TPN to contain standard multivitamins and trace elements. Insulin in TPN = 30 units.   IVF per MD   Continue SSI q4h MODERATE scale  CMET, Mag, Phos levels tomorrow  TPN lab panels on Mondays & Thursdays.  F/u daily.   GMinda DittoPharmD Pager [phone]/29/2019, 11:09 AM

## 2017-11-19 ENCOUNTER — Inpatient Hospital Stay (HOSPITAL_COMMUNITY): Payer: BLUE CROSS/BLUE SHIELD

## 2017-11-19 DIAGNOSIS — R5381 Other malaise: Secondary | ICD-10-CM

## 2017-11-19 DIAGNOSIS — E43 Unspecified severe protein-calorie malnutrition: Secondary | ICD-10-CM

## 2017-11-19 DIAGNOSIS — R140 Abdominal distension (gaseous): Secondary | ICD-10-CM

## 2017-11-19 LAB — GLUCOSE, CAPILLARY
GLUCOSE-CAPILLARY: 152 mg/dL — AB (ref 65–99)
GLUCOSE-CAPILLARY: 153 mg/dL — AB (ref 65–99)
GLUCOSE-CAPILLARY: 148 mg/dL — AB (ref 65–99)
GLUCOSE-CAPILLARY: 156 mg/dL — AB (ref 65–99)
Glucose-Capillary: 134 mg/dL — ABNORMAL HIGH (ref 65–99)
Glucose-Capillary: 126 mg/dL — ABNORMAL HIGH (ref 65–99)

## 2017-11-19 LAB — COMPREHENSIVE METABOLIC PANEL
ALBUMIN: 1.8 g/dL — AB (ref 3.5–5.0)
ALK PHOS: 182 U/L — AB (ref 38–126)
ALT: 89 U/L — AB (ref 14–54)
AST: 83 U/L — ABNORMAL HIGH (ref 15–41)
Anion gap: 7 (ref 5–15)
BILIRUBIN TOTAL: 0.6 mg/dL (ref 0.3–1.2)
BUN: 50 mg/dL — AB (ref 6–20)
CALCIUM: 7.7 mg/dL — AB (ref 8.9–10.3)
CO2: 22 mmol/L (ref 22–32)
CREATININE: 1.6 mg/dL — AB (ref 0.44–1.00)
Chloride: 114 mmol/L — ABNORMAL HIGH (ref 101–111)
GFR calc Af Amer: 41 mL/min — ABNORMAL LOW (ref >60)
GFR calc non Af Amer: 35 mL/min — ABNORMAL LOW (ref >60)
Glucose, Bld: 162 mg/dL — ABNORMAL HIGH (ref 65–99)
Potassium: 3.7 mmol/L (ref 3.5–5.1)
SODIUM: 143 mmol/L (ref 135–145)
TOTAL PROTEIN: 5.8 g/dL — AB (ref 6.5–8.1)

## 2017-11-19 LAB — MAGNESIUM: Magnesium: 1.8 mg/dL (ref 1.7–2.4)

## 2017-11-19 LAB — CBC
HEMATOCRIT: 25.5 % — AB (ref 36.0–46.0)
HEMOGLOBIN: 8.1 g/dL — AB (ref 12.0–15.0)
MCH: 29.9 pg (ref 26.0–34.0)
MCHC: 31.8 g/dL (ref 30.0–36.0)
MCV: 94.1 fL (ref 78.0–100.0)
Platelets: 470 10*3/uL — ABNORMAL HIGH (ref 150–400)
RBC: 2.71 MIL/uL — AB (ref 3.87–5.11)
RDW: 16.5 % — ABNORMAL HIGH (ref 11.5–15.5)
WBC: 18.7 10*3/uL — ABNORMAL HIGH (ref 4.0–10.5)

## 2017-11-19 LAB — PHOSPHORUS: Phosphorus: 4.5 mg/dL (ref 2.5–4.6)

## 2017-11-19 MED ORDER — FENTANYL CITRATE (PF) 100 MCG/2ML IJ SOLN
50.0000 ug | INTRAMUSCULAR | Status: DC | PRN
  Administered 2017-11-19 – 2017-11-20 (×7): 50 ug via INTRAVENOUS
  Filled 2017-11-19 (×7): qty 2

## 2017-11-19 MED ORDER — TRACE MINERALS CR-CU-MN-SE-ZN 10-1000-500-60 MCG/ML IV SOLN
INTRAVENOUS | Status: AC
  Administered 2017-11-19: 17:00:00 via INTRAVENOUS
  Filled 2017-11-19 (×2): qty 1440

## 2017-11-19 MED ORDER — FUROSEMIDE 10 MG/ML IJ SOLN
80.0000 mg | Freq: Two times a day (BID) | INTRAMUSCULAR | Status: DC
  Administered 2017-11-19: 80 mg via INTRAVENOUS
  Filled 2017-11-19: qty 8

## 2017-11-19 MED ORDER — FENTANYL 75 MCG/HR TD PT72
75.0000 ug | MEDICATED_PATCH | TRANSDERMAL | Status: DC
  Administered 2017-11-19: 75 ug via TRANSDERMAL
  Filled 2017-11-19: qty 1

## 2017-11-19 MED ORDER — ENOXAPARIN SODIUM 40 MG/0.4ML ~~LOC~~ SOLN
40.0000 mg | SUBCUTANEOUS | Status: DC
  Administered 2017-11-19: 40 mg via SUBCUTANEOUS
  Filled 2017-11-19: qty 0.4

## 2017-11-19 NOTE — Progress Notes (Signed)
PROGRESS NOTE        PATIENT DETAILS Name: Emma Washington Age: 57 y.o. Sex: female Date of Birth: [birth year] Admit Date: November 10, 2017 Admitting Physician Albertine Grates, MD PCP:No primary care provider on file.  Brief Narrative: Patient is a 57 y.o. 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  She briefly required CRRT, subsequently extubated and transferred to try to hospitalist service.  She slowly improving-but continues to have severe abdominal pain, massive volume overload, and shortness of breath.  See below for further details  Subjective: Complains of dyspnea-is tachypneic-pain is uncontrolled.  Last BM was yesterday.  Assessment/Plan: Severe necrotizing gallstone pancreatitis: Continues to have significant abdominal pain-not tolerating advancement to full liquids.  Increase transdermal fentanyl to 50 mcg every 72 hours-increase breakthrough fentanyl to 75 mcg every 2 hours.  She is massively volume overloaded-stop IV fluids, attempt gentle diuresis.  She is on TNA-GI and general surgery following.  Acute kidney injury: Likely hemodynamically mediated in the setting of pancreatitis-improving slowly-required CRRT during early hospital course.  She currently is massively volume overloaded-she is tachypneic and short of breath has rales up to mid lung bilaterally.  Start Lasix, and follow electrolytes/weights.  Acute hypoxemic respiratory failure: Much more tachypneic today-has rales up to mid lung bilaterally-she is massively volume overloaded.  Chest x-ray consistent with vascular congestion-we will start Lasix and move to the stepdown unit.  Keep BiPAP at bedside.  Note-she required ventilator dependence during the early part of the hospital stay, and was extubated on 1/20  Anemia of critical illness:  Hemoglobin currently stable-no indication for transfusion at this time  Massive volume overload: Secondary to acute illness/IV fluid resuscitation/hypoalbuminemia-weight has increased to 314 pounds-was approximately 296 pounds on admission.  Morbid obesity  Suspected OHS/OSA: Outpatient sleep study  DVT Prophylaxis: Prophylactic Lovenox   Code Status: Full code  Family Communication: None at bedside  Disposition Plan: Remain inpatient-transfer to stepdown  Antimicrobial agents: Anti-infectives (From admission, onward)   Start     Dose/Rate Route Frequency Ordered Stop   11/12/17 1800  meropenem (MERREM) 1 g in sodium chloride 0.9 % 100 mL IVPB  Status:  Discontinued     1 g 200 mL/hr over 30 Minutes Intravenous Every 8 hours 11/12/17 1115 11/17/17 0954   11/11/17 1000  meropenem (MERREM) 1 g in sodium chloride 0.9 % 100 mL IVPB  Status:  Discontinued     1 g 200 mL/hr over 30 Minutes Intravenous Every 12 hours 11/11/17 0911 11/12/17 1115   11/09/17 1000  piperacillin-tazobactam (ZOSYN) IVPB 3.375 g  Status:  Discontinued     3.375 g 12.5 mL/hr over 240 Minutes Intravenous Every 8 hours 11/09/17 0614 11/09/17 0615   11/09/17 1000  piperacillin-tazobactam (ZOSYN) IVPB 3.375 g  Status:  Discontinued     3.375 g 100 mL/hr over 30 Minutes Intravenous Every 6 hours 11/09/17 0616 11/11/17 0911   11/07/17 2200  piperacillin-tazobactam (ZOSYN) IVPB 2.25 g  Status:  Discontinued     2.25 g 100 mL/hr over 30 Minutes Intravenous Every 8 hours 11/07/17 1218 11/07/17 1741   11/07/17 2200  piperacillin-tazobactam (ZOSYN) IVPB 3.375 g  Status:  Discontinued     3.375 g 12.5 mL/hr over 240 Minutes Intravenous Every 6 hours 11/07/17 1741 11/07/17 1747  11/07/17 2200  piperacillin-tazobactam (ZOSYN) IVPB 3.375 g  Status:  Discontinued     3.375 g 100 mL/hr over 30 Minutes Intravenous Every 6 hours 11/07/17 1747 11/09/17 0613   11/04/17 1100  piperacillin-tazobactam (ZOSYN) IVPB 3.375 g   Status:  Discontinued     3.375 g 12.5 mL/hr over 240 Minutes Intravenous Every 8 hours 11/04/17 1059 11/07/17 1218   10/24/2017 1200  piperacillin-tazobactam (ZOSYN) IVPB 3.375 g     3.375 g 100 mL/hr over 30 Minutes Intravenous  Once 11/16/2017 1157 11/14/2017 1309      Procedures: ETT 1/18 >> 1/20  CONSULTS:  pulmonary/intensive care, GI and general surgery  Time spent: 35 minutes-Greater than 50% of this time was spent in counseling, explanation of diagnosis, planning of further management, and coordination of care.  MEDICATIONS: Scheduled Meds: . chlorhexidine  15 mL Mouth Rinse BID  . fentaNYL  75 mcg Transdermal Q72H  . furosemide  80 mg Intravenous BID  . insulin aspart  0-15 Units Subcutaneous Q4H  . mouth rinse  15 mL Mouth Rinse q12n4p  . pantoprazole (PROTONIX) IV  40 mg Intravenous Q12H  . sodium chloride flush  10-40 mL Intracatheter Q12H   Continuous Infusions: . sodium chloride 50 mL/hr at 11/18/17 1003  . TPN (CLINIMIX) Adult without lytes 40 mL/hr at 11/18/17 1746  . TPN (CLINIMIX) Adult without lytes     PRN Meds:.acetaminophen, albuterol, fentaNYL (SUBLIMAZE) injection, haloperidol lactate, lip balm, naLOXone (NARCAN)  injection, ondansetron (ZOFRAN) IV, sodium chloride flush, sodium chloride flush   PHYSICAL EXAM: Vital signs: Vitals:   11/19/17 0551 11/19/17 1005 11/19/17 1348 11/19/17 1400  BP: (!) 145/75  130/86   Pulse: (!) 106 98 (!) 109   Resp: (!) 28  19 (!) 28  Temp: 99 F (37.2 C)  98 F (36.7 C)   TempSrc: Axillary  Oral   SpO2: 92% 94% 94%   Weight: (!) 142.6 kg (314 lb 6 oz)     Height:       Filed Weights   11/16/17 0530 11/18/17 0432 11/19/17 0551  Weight: 61.7 kg (136 lb) (!) 138.6 kg (305 lb 8.9 oz) (!) 142.6 kg (314 lb 6 oz)   Body mass index is 46.43 kg/m.   General appearance :Awake, alert, in slight distress-tachypneic probably to the 30s. Eyes:, pupils equally reactive to light and accomodation HEENT: Atraumatic and  Normocephalic Neck: supple, no JVD.  Resp:Good air entry bilaterally, bibasilar rales-up to the mid lung fields. CVS: S1 S2 regular, no murmurs.  GI: Bowel sounds present, tender in the epigastric area somewhat distended. Extremities: B/L Lower Ext shows ++ edema, both legs are warm to touch Neurology:  speech clear,Non focal, sensation is grossly intact. Musculoskeletal:No digital cyanosis Skin:No Rash, warm and dry Wounds:N/A  I have personally reviewed following labs and imaging studies  LABORATORY DATA: CBC: Recent Labs  Lab 11/13/17 0443 11/14/17 0626 11/16/17 0444 11/17/17 0452 11/19/17 0402  WBC 25.0* 25.0* 20.1* 20.1* 18.7*  NEUTROABS  --  21.9*  --  17.9*  --   HGB 8.3* 8.2* 8.2* 8.2* 8.1*  HCT 25.8* 25.6* 25.8* 25.5* 25.5*  MCV 92.5 93.8 95.2 95.1 94.1  PLT 328 403* 541* 559* 470*    Basic Metabolic Panel: Recent Labs  Lab 11/14/17 0626 11/15/17 0249 11/16/17 0444 11/17/17 0452 11/18/17 0517 11/19/17 0402  NA 147* 144 147* 149* 145 143  K 4.0 4.3 3.9 3.7 3.7 3.7  CL 112* 112* 116* 119* 117* 114*  CO2 24  24 25 21* 23 22  GLUCOSE 199* 185* 183* 169* 162* 162*  BUN 60* 62* 61* 58* 56* 50*  CREATININE 2.27* 2.30* 2.09* 1.81* 1.79* 1.60*  CALCIUM 6.9* 7.0* 7.1* 7.4* 7.6* 7.7*  MG 2.7*  --   --  2.1  --  1.8  PHOS 4.3  4.4 4.4 4.4 4.1  --  4.5    GFR: Estimated Creatinine Clearance: 60.7 mL/min (A) (by C-G formula based on SCr of 1.6 mg/dL (H)).  Liver Function Tests: Recent Labs  Lab 11/14/17 0626 11/15/17 0249 11/16/17 0444 11/17/17 0452 11/19/17 0402  AST 57*  --   --  92* 83*  ALT 50  --   --  72* 89*  ALKPHOS 115  --   --  129* 182*  BILITOT 0.6  --   --  0.5 0.6  PROT 5.4*  --   --  5.8* 5.8*  ALBUMIN 1.9*  1.8* 1.9* 1.9* 1.9* 1.8*   Recent Labs  Lab 11/13/17 1110  LIPASE 54*   No results for input(s): AMMONIA in the last 168 hours.  Coagulation Profile: No results for input(s): INR, PROTIME in the last 168 hours.  Cardiac  Enzymes: No results for input(s): CKTOTAL, CKMB, CKMBINDEX, TROPONINI in the last 168 hours.  BNP (last 3 results) No results for input(s): PROBNP in the last 8760 hours.  HbA1C: No results for input(s): HGBA1C in the last 72 hours.  CBG: Recent Labs  Lab 11/18/17 1951 11/18/17 2322 11/19/17 0408 11/19/17 0716 11/19/17 1146  GLUCAP 195* 167* 152* 134* 126*    Lipid Profile: Recent Labs    11/17/17 0452  TRIG 276*    Thyroid Function Tests: No results for input(s): TSH, T4TOTAL, FREET4, T3FREE, THYROIDAB in the last 72 hours.  Anemia Panel: No results for input(s): VITAMINB12, FOLATE, FERRITIN, TIBC, IRON, RETICCTPCT in the last 72 hours.  Urine analysis:    Component Value Date/Time   COLORURINE AMBER (A) 11/06/2017 1245   APPEARANCEUR CLOUDY (A) 11/06/2017 1245   LABSPEC 1.018 11/06/2017 1245   PHURINE 5.0 11/06/2017 1245   GLUCOSEU 50 (A) 11/06/2017 1245   HGBUR LARGE (A) 11/06/2017 1245   BILIRUBINUR NEGATIVE 11/06/2017 1245   KETONESUR NEGATIVE 11/06/2017 1245   PROTEINUR 30 (A) 11/06/2017 1245   NITRITE NEGATIVE 11/06/2017 1245   LEUKOCYTESUR NEGATIVE 11/06/2017 1245    Sepsis Labs: Lactic Acid, Venous No results found for: LATICACIDVEN  MICROBIOLOGY: Recent Results (from the past 240 hour(s))  Culture, blood (routine x 2)     Status: None   Collection Time: 11/11/17 10:05 AM  Result Value Ref Range Status   Specimen Description BLOOD RIGHT ANTECUBITAL  Final   Special Requests   Final    BOTTLES DRAWN AEROBIC AND ANAEROBIC Blood Culture adequate volume   Culture   Final    NO GROWTH 5 DAYS Performed at [facility], [address], [place], [place] [address]    Report Status 11/16/2017 FINAL  Final  Culture, blood (routine x 2)     Status: None   Collection Time: 11/11/17 10:22 AM  Result Value Ref Range Status   Specimen Description BLOOD RIGHT ANTECUBITAL  Final   Special Requests   Final    BOTTLES DRAWN AEROBIC ONLY Blood Culture  adequate volume   Culture   Final    NO GROWTH 5 DAYS Performed at [facility], [address] [place], [place] [address]    Report Status 11/16/2017 FINAL  Final  RADIOLOGY STUDIES/RESULTS: Ct Abdomen Pelvis Wo Contrast  Result Date: 11/11/2017 CLINICAL DATA:  57 year old female with a history of necrotizing pancreatitis now with worsening leukocytosis. Evaluate for evidence of superinfection. EXAM: CT ABDOMEN AND PELVIS WITHOUT CONTRAST TECHNIQUE: Multidetector CT imaging of the abdomen and pelvis was performed following the standard protocol without IV contrast. COMPARISON:  Prior CT scan of the abdomen and pelvis 10/22/2017 FINDINGS: Lower chest: Small bilateral pleural effusions and associated lower lobe atelectasis. The intracardiac blood pool is hypodense relative to the adjacent myocardium consistent with anemia. No pericardial effusion. Unremarkable distal thoracic esophagus. Hepatobiliary: Severe hypoattenuation of the hepatic parenchyma consistent with hepatic steatosis. There is some sparing around the gallbladder fossa. High attenuation material within the gallbladder consistent with sludge and/or small stones. No intra or extrahepatic biliary ductal dilatation. Pancreas: The pancreas is diffusely edematous. No discrete mass or pseudocyst. Inflammatory stranding extends throughout the peripancreatic soft tissues and into the mesenteric root. The degree of peripancreatic inflammatory stranding has significantly progressed compared to 11/20/2017. Evaluation is limited in the absence of intravenous contrast. Spleen: Normal in size without focal abnormality. Adrenals/Urinary Tract: Adrenal glands are unremarkable. Kidneys are normal, without renal calculi, focal lesion, or hydronephrosis. Foley catheter in the bladder. Stomach/Bowel: No evidence of obstruction or focal bowel wall thickening. Normal appendix in the right lower quadrant. The terminal ileum is unremarkable.  Vascular/Lymphatic: Limited evaluation in the absence of intravenous contrast. No aneurysm, significant atherosclerotic calcifications or suspicious lymphadenopathy. Reproductive: Uterus and bilateral adnexa are unremarkable. Other: Progressive inflammatory stranding throughout the mesenteric root and peripancreatic soft tissues. Small volume ascites along the greater curvature of the stomach, in the left perisplenic space and extending inferiorly along the left greater than right colic gutters to layer in the anatomic pelvis. No loculation or intra fluid gas to suggest infection. Musculoskeletal: No acute fracture or aggressive appearing lytic or blastic osseous lesion. IMPRESSION: 1. Progressive severe pancreatitis and secondary inflammatory changes throughout the peripancreatic soft tissues and mesenteric root. There is now small volume ascites which is likely reactive in nature. No loculation or intra fluid gas to suggest infection. 2. Small bilateral pleural effusions and associated atelectasis. 3. The intracardiac blood pool is hypodense relative to the adjacent myocardium consistent with anemia. 4. At least moderate hepatic steatosis. 5. Cholelithiasis. Electronically Signed   By: Malachy MoanHeath  McCullough M.D.   On: 11/11/2017 14:57   Dg Abd 1 View  Result Date: 11/10/2017 CLINICAL DATA:  NG tube placement Best obtainable notes due to patients size, pain and altered mental status. I could not get her to stop rocking and talking EXAM: ABDOMEN - 1 VIEW COMPARISON:  Earlier film of the same day FINDINGS: Weighted enteric tube tip is in the gastric body as before. Normal bowel gas pattern. The lower abdomen is is excluded. IMPRESSION: 1. Stable placement of feeding tube tip in the gastric body. Electronically Signed   By: Corlis Leak  Hassell M.D.   On: 11/10/2017 19:29   Dg Abd 1 View  Result Date: 11/07/2017 CLINICAL DATA:  Orogastric tube placement. EXAM: ABDOMEN - 1 VIEW COMPARISON:  None. FINDINGS: Orogastric tube  extends into the stomach. Bowel gas pattern shows probable ileus. IMPRESSION: Orogastric tube extends into the stomach. Electronically Signed   By: Irish LackGlenn  Yamagata M.D.   On: 11/07/2017 15:23   [place] Abdomen Complete  Result Date: 11/09/2017 CLINICAL DATA:  Epigastric and right upper quadrant discomfort, syncope, vomiting, hematuria. EXAM: ABDOMEN ULTRASOUND COMPLETE COMPARISON:  Abdominal and pelvic CT scan of today's date FINDINGS:  Gallbladder: Probable small stones. No gallbladder wall thickening or pericholecystic fluid or positive sonographic Murphy's sign. Common bile duct: Diameter: 6.7 mm Liver: Visualization of the hepatic parenchyma is limited by bowel poor penetration of the ultrasound beam. The echotexture is subjectively increased. There is no focal mass or ductal dilation. Portal vein is patent on color Doppler imaging with normal direction of blood flow towards the liver. IVC: No abnormality visualized. Pancreas: Visualization of the pancreas is limited due to bowel gas. Spleen: Size and appearance within normal limits. Right Kidney: Length: 13.2 cm. Echogenicity within normal limits. No mass or hydronephrosis visualized. Left Kidney: Length: 11.9 cm. Echogenicity within normal limits. No mass or hydronephrosis visualized. Abdominal aorta: Bowel gas limits evaluation of the abdominal aorta. Other findings: No significant ascites is observed. IMPRESSION: The study is limited overall due to bowel gas and the patient's body habitus. Multiple tiny gallstones are suspected. There is mild prominence of the common bile duct without definite intraluminal stones. Increased hepatic echotexture compatible with fatty infiltrative change. Poor evaluation of the pancreas due to bowel gas and surrounding inflammatory changes. Electronically Signed   By: David  [place] M.D.   On: 11/06/2017 14:02   US Renal  Result Date: 11/06/2017 CLINICAL DATA:  Acute kidney injury. EXAM: RENAL / URINARY TRACT ULTRASOUND  COMPLETE COMPARISON:  None. FINDINGS: Right Kidney: Length: 12.8 cm. Echogenicity within normal limits. No mass or hydronephrosis visualized. Left Kidney: Length: 11.1 cm. Echogenicity within normal limits. No mass or hydronephrosis visualized. Bladder: Not visualize. Other: Small volume pelvic ascites. IMPRESSION: 1. Normal renal ultrasound. Electronically Signed   By: Elige Ko   On: 11/06/2017 15:15   Mr 3d Recon At Scanner  Result Date: 11/04/2017 CLINICAL DATA:  Pancreatitis with gallstones. EXAM: MRI ABDOMEN WITHOUT AND WITH CONTRAST (INCLUDING MRCP) TECHNIQUE: Multiplanar multisequence MR imaging of the abdomen was performed both before and after the administration of intravenous contrast. Heavily T2-weighted images of the biliary and pancreatic ducts were obtained, and three-dimensional MRCP images were rendered by post processing. CONTRAST:  20 cc MultiHance COMPARISON:  CT scan 11/03/2016 FINDINGS: Markedly motion degraded study. Lower chest: The heart is enlarged without substantial pericardial effusion. Hepatobiliary: Liver measures 20.8 cm and craniocaudal length, enlarged. Multiple gallstones are identified, measuring up to 7 mm diameter. Some stones or trapped behind a fold in the gallbladder fundus. MRCP images are markedly motion degraded and nondiagnostic for tiny common duct stones. Axial T2 weighted single shot imaging shows common duct measuring 7 mm diameter. Common bile duct in the head of the pancreas also measures 7 mm diameter. The axial T2 weighted sequences reveal some central flow artifact in the common duct although a very tiny dependent signal void is seen in the common bile duct on axial image 34 series 5 that maps to a tiny filling defect in the common bile duct seen on coronal MRCP image 34 of series 6. This appearance is compatible with a tiny common bile duct stone. Pancreas: . pancreas is diffusely edematous with peripancreatic edema tracking diffusely in the anterior  pararenal space. Postcontrast imaging shows enhancement in the head of pancreas extreme pancreatic tail but most of the body and tail of pancreas do not enhance. No focal or rim enhancing fluid collection identified in the retroperitoneal space. Spleen:  No splenomegaly. No focal mass lesion. Adrenals/Urinary Tract: No adrenal nodule or mass. Limited assessment of the kidney shows no hydronephrosis or gross mass lesion. Stomach/Bowel: Stomach is nondistended. No gastric wall thickening. No evidence of  outlet obstruction. Duodenum is normally positioned as is the ligament of Treitz. No small bowel or colonic dilatation within the visualized abdomen. Vascular/Lymphatic: Portal vein and superior mesenteric vein are patent. Splenic vein is patent. Celiac axis and SMA are patent. Small retroperitoneal / peripancreatic lymph nodes noted. Other: Free fluid is noted around the liver and spleen. Mesenteric edema/fluid is evident. Musculoskeletal: No abnormal marrow enhancement within the visualized bony anatomy. IMPRESSION: 1. Markedly motion degraded study. 2. Cholelithiasis with borderline distention of the extrahepatic bile ducts. 3 mm filling defect in the common duct as it enters the head of the pancreas is consistent with with choledocholithiasis. 3. Nonenhancement involving much of the pancreatic body and tail parenchyma, highly suggestive of pancreatic necrosis. 4. Extensive edema/inflammation in the retroperitoneal space with intraperitoneal fluid and diffuse mesenteric edema in the abdomen. No focal/organized or rim enhancing fluid collection at this time to suggest evolving pseudocyst or abscess. 5. Hepatomegaly. 6. Portal vein is patent. Electronically Signed   By: Kennith Center M.D.   On: 11/04/2017 08:12   Dg Chest Port 1 View  Result Date: 11/11/2017 CLINICAL DATA:  Pancreatitis.  Shortness of breath. EXAM: PORTABLE CHEST 1 VIEW COMPARISON:  11/09/2017. FINDINGS: Interim extubation. Interim placement of  feeding scratched it interim removal of NG tube placement of feeding tube. Right IJ line in stable position. Cardiomegaly. Low lung volumes. Left lower lobe infiltrate suggesting pneumonia. Asymmetric pulmonary edema could also present this fashion. Small left pleural effusion. No pneumothorax. IMPRESSION: 1. Interim extubation and removal of NG tube. Interim placement of feeding tube, its tip is below left hemidiaphragm. Right IJ line stable position. 2. Low lung volumes with basilar atelectasis. Left lower lobe infiltrate/edema and small left pleural effusion. 3.  Stable cardiomegaly. Electronically Signed   By: Maisie Fus  Register   On: 11/11/2017 06:57   Dg Chest Port 1 View  Result Date: 11/09/2017 CLINICAL DATA:  Hypoxia EXAM: PORTABLE CHEST 1 VIEW COMPARISON:  November 08, 2017 FINDINGS: Endotracheal tube tip is 3.2 cm above the carina. Nasogastric tube tip and side port are below the diaphragm. Central catheter tip is in the superior vena cava. No pneumothorax. There is persistent atelectatic change in the left base with small left pleural effusion. There is mild right base atelectasis. Heart is upper normal in size with pulmonary vascularity within normal limits. No adenopathy. No bone lesions. IMPRESSION: Tube and catheter positions as described without pneumothorax. Persistent bibasilar atelectasis, more on the left than on the right, stable. No new opacity. Small left pleural effusion. Stable cardiac silhouette. Electronically Signed   By: Bretta Bang III M.D.   On: 11/09/2017 07:16   Dg Chest Port 1 View  Result Date: 11/08/2017 CLINICAL DATA:  Hypoxia EXAM: PORTABLE CHEST 1 VIEW COMPARISON:  November 07, 2017 FINDINGS: Endotracheal tube tip is 3.7 cm above the carina. Nasogastric tube tip and side port are below the diaphragm. Central catheter tip is in the superior vena cava. No pneumothorax. There is atelectatic change in both lung bases, more on the left than on the right, stable. There is  a minimal pleural effusion on each side. Heart is mildly enlarged with pulmonary vascularity within normal limits. No adenopathy. No bone lesions. IMPRESSION: Tube and catheter positions as described without evident pneumothorax. Bibasilar atelectasis, more on the left than on the right, with small bilateral pleural effusions. Stable cardiac prominence. Electronically Signed   By: Bretta Bang III M.D.   On: 11/08/2017 07:13   Dg Chest [facility]  Result Date: 11/07/2017 CLINICAL DATA:  Central line placement. EXAM: PORTABLE CHEST 1 VIEW COMPARISON:  0240 hours FINDINGS: Interval placement of right jugular non tunneled hemodialysis catheter with the catheter tip at the level of the mid SVC. No pneumothorax. Endotracheal tube remains present with the tip approximately 3.5 cm above the carina. Gastric decompression tube extends into the stomach. Lungs show improved aeration bilaterally and decrease in pulmonary edema. Probable component of left pleural fluid. IMPRESSION: Non tunneled dialysis catheter tip in SVC. No pneumothorax identified after placement. Lungs show improved aeration with decrease in pulmonary edema. Electronically Signed   By: Irish Lack M.D.   On: 11/07/2017 15:22   Dg Chest Port 1 View  Result Date: 11/07/2017 CLINICAL DATA:  56 y/o  F; post intubation, ET tube position. EXAM: PORTABLE CHEST 1 VIEW COMPARISON:  11/06/2017 chest radiograph FINDINGS: Low lung volumes. Stable enlarged cardiac silhouette given projection and technique. Increased diffuse hazy airspace opacities and small effusions probably representing pulmonary edema. Endotracheal tube is 1.4 cm from carina. No acute osseous abnormality identified. IMPRESSION: Endotracheal tube 1.4 cm from carina. Increased hazy lung opacities compatible with pulmonary edema and small effusions bilaterally. Electronically Signed   By: Mitzi Hansen M.D.   On: 11/07/2017 04:24   Dg Chest Port 1 View  Result Date:  11/06/2017 CLINICAL DATA:  Acute respiratory failure EXAM: PORTABLE CHEST 1 VIEW COMPARISON:  11/04/2017 FINDINGS: Hypoventilation with decreased lung volume. Increase in bibasilar atelectasis left greater than right. Probable left pleural effusion. Pulmonary vascular congestion suggesting mild heart failure. IMPRESSION: Hypoventilation with increased atelectasis in lung bases left greater than right. Small left effusion. Progression of pulmonary vascular congestion suggesting mild fluid overload. Electronically Signed   By: Marlan Palau M.D.   On: 11/06/2017 09:05   Dg Chest Port 1 View  Result Date: 11/04/2017 CLINICAL DATA:  Shortness of breath, weakness, and fever. EXAM: PORTABLE CHEST 1 VIEW COMPARISON:  Report from prior chest radiograph 10/19/2013 FINDINGS: The patient is rotated to the right on today's radiograph, reducing diagnostic sensitivity and specificity. Mild enlargement of the cardiopericardial silhouette. Prominence of mediastinal contour is probably from fatty mediastinal tissues based on comparison to the recent abdomen MRI. Low lung volumes are present, causing crowding of the pulmonary vasculature. Indistinct airspace opacity at the left lung base compatible with atelectasis or pneumonia. Similar indistinct density at the right lung base medially. Rightward tracheal deviation, partly rotation related IMPRESSION: 1. Left greater than right airspace opacities in the lung bases favoring atelectasis or pneumonia. 2. Low lung volumes are present, causing crowding of the pulmonary vasculature. 3. Mild enlargement of the cardiopericardial silhouette. Electronically Signed   By: Gaylyn Rong M.D.   On: 11/04/2017 14:27   Dg Chest Port 1v Same Day  Result Date: 11/19/2017 CLINICAL DATA:  Increase shortness of breath EXAM: PORTABLE CHEST 1 VIEW COMPARISON:  11/11/2017 FINDINGS: 0851 hours. Low lung volumes. The cardio pericardial silhouette is enlarged. There is pulmonary vascular  congestion without overt pulmonary edema. Persistent left base collapse/consolidation, similar. Right PICC line tip overlies the distal SVC. The visualized bony structures of the thorax are intact. IMPRESSION: No substantial interval change. Cardiomegaly with vascular congestion and left base collapse/consolidation. Electronically Signed   By: Kennith Center M.D.   On: 11/19/2017 10:02   Dg Abd Decub  Result Date: 11/19/2017 CLINICAL DATA:  Ileus, abdominal distention. EXAM: ABDOMEN - 1 VIEW DECUBITUS COMPARISON:  11/13/2017 and CT abdomen pelvis 11/11/2017. FINDINGS: Two left lateral decubitus views of the  abdomen show air-fluid levels in dilated bowel. No definite free air. Abdomen is incompletely imaged. IMPRESSION: Gaseous distention of bowel with air-fluid levels is compatible with the given history of an ileus. Abdomen is incompletely imaged. Electronically Signed   By: Leanna Battles M.D.   On: 11/19/2017 12:02   Dg Abd Portable 1v  Result Date: 11/13/2017 CLINICAL DATA:  NG tube placement. EXAM: PORTABLE ABDOMEN - 1 VIEW COMPARISON:  No recent prior. FINDINGS: NG tube noted with tip coiled stomach. No bowel distention noted. Bibasilar atelectasis. No acute bony abnormality noted. IMPRESSION: NG tube noted with tip coiled in the stomach. Electronically Signed   By: Maisie Fus  Register   On: 11/13/2017 10:07   Dg Abd Portable 1v  Result Date: 11/10/2017 CLINICAL DATA:  Status post feeding tube placement. EXAM: PORTABLE ABDOMEN - 1 VIEW COMPARISON:  Abdominal radiograph of 07 November 2017 FINDINGS: The nasogastric tube has been removed and replaced with a radiodense tipped feeding tube. Thetube tip is in the mid gastric body. IMPRESSION: The tip of the feeding tube is in the mid gastric body. Electronically Signed   By: David  [place] M.D.   On: 11/10/2017 11:28   Mr Abdomen Mrcp Vivien Rossetti Contast  Result Date: 11/04/2017 CLINICAL DATA:  Pancreatitis with gallstones. EXAM: MRI ABDOMEN WITHOUT AND WITH  CONTRAST (INCLUDING MRCP) TECHNIQUE: Multiplanar multisequence MR imaging of the abdomen was performed both before and after the administration of intravenous contrast. Heavily T2-weighted images of the biliary and pancreatic ducts were obtained, and three-dimensional MRCP images were rendered by post processing. CONTRAST:  20 cc MultiHance COMPARISON:  CT scan 11/03/2016 FINDINGS: Markedly motion degraded study. Lower chest: The heart is enlarged without substantial pericardial effusion. Hepatobiliary: Liver measures 20.8 cm and craniocaudal length, enlarged. Multiple gallstones are identified, measuring up to 7 mm diameter. Some stones or trapped behind a fold in the gallbladder fundus. MRCP images are markedly motion degraded and nondiagnostic for tiny common duct stones. Axial T2 weighted single shot imaging shows common duct measuring 7 mm diameter. Common bile duct in the head of the pancreas also measures 7 mm diameter. The axial T2 weighted sequences reveal some central flow artifact in the common duct although a very tiny dependent signal void is seen in the common bile duct on axial image 34 series 5 that maps to a tiny filling defect in the common bile duct seen on coronal MRCP image 34 of series 6. This appearance is compatible with a tiny common bile duct stone. Pancreas: . pancreas is diffusely edematous with peripancreatic edema tracking diffusely in the anterior pararenal space. Postcontrast imaging shows enhancement in the head of pancreas extreme pancreatic tail but most of the body and tail of pancreas do not enhance. No focal or rim enhancing fluid collection identified in the retroperitoneal space. Spleen:  No splenomegaly. No focal mass lesion. Adrenals/Urinary Tract: No adrenal nodule or mass. Limited assessment of the kidney shows no hydronephrosis or gross mass lesion. Stomach/Bowel: Stomach is nondistended. No gastric wall thickening. No evidence of outlet obstruction. Duodenum is normally  positioned as is the ligament of Treitz. No small bowel or colonic dilatation within the visualized abdomen. Vascular/Lymphatic: Portal vein and superior mesenteric vein are patent. Splenic vein is patent. Celiac axis and SMA are patent. Small retroperitoneal / peripancreatic lymph nodes noted. Other: Free fluid is noted around the liver and spleen. Mesenteric edema/fluid is evident. Musculoskeletal: No abnormal marrow enhancement within the visualized bony anatomy. IMPRESSION: 1. Markedly motion degraded study. 2. Cholelithiasis  with borderline distention of the extrahepatic bile ducts. 3 mm filling defect in the common duct as it enters the head of the pancreas is consistent with with choledocholithiasis. 3. Nonenhancement involving much of the pancreatic body and tail parenchyma, highly suggestive of pancreatic necrosis. 4. Extensive edema/inflammation in the retroperitoneal space with intraperitoneal fluid and diffuse mesenteric edema in the abdomen. No focal/organized or rim enhancing fluid collection at this time to suggest evolving pseudocyst or abscess. 5. Hepatomegaly. 6. Portal vein is patent. Electronically Signed   By: Kennith Center M.D.   On: 11/04/2017 08:12   Ct Renal Stone Study  Result Date: 10/25/2017 CLINICAL DATA:  Left-sided flank pain and hematuria.  Vomiting. EXAM: CT ABDOMEN AND PELVIS WITHOUT CONTRAST TECHNIQUE: Multidetector CT imaging of the abdomen and pelvis was performed following the standard protocol without oral or intravenous contrast material administration. COMPARISON:  CT abdomen and pelvis April 09, 2008 FINDINGS: Lower chest: There is bibasilar atelectatic change. Hepatobiliary: There is hepatic steatosis. No focal liver lesions are evident on this noncontrast enhanced study. Gallbladder wall is not appreciably thickened. There is no biliary duct dilatation. Pancreas: There is edema throughout the pancreas with soft tissue stranding in the adjacent peripancreatic mesentery  throughout the peripancreatic region. Fluid tracks to the right of the pancreatic head and inferior to the uncinate process as well as anterior to the pancreatic head. Fluid tracks from the body and tail the pancreas anteriorly to reach the greater curvature of the stomach in the midportion of the gastric body region. A well-defined pseudocyst type lesion is not seen. There is no pancreatic duct dilatation. Spleen: No splenic lesions are evident. Adrenals/Urinary Tract: Adrenals bilaterally appear normal. Kidneys bilaterally show no evident mass or hydronephrosis. There is a 1 mm calculus in the upper pole left kidney, nonobstructing. There is no evident ureteral calculus on either side. Urinary bladder is midline with wall thickness within normal limits. Stomach/Bowel: There is mild wall thickening along the greater curvature of the stomach in the midbody region in along the second and third duodenum due to nearby pancreatitis. No other bowel wall thickening is evident. There is no appreciable bowel obstruction. No evident free air or portal venous air. Vascular/Lymphatic: There is no abdominal aortic aneurysm. No vascular lesions are appreciable on this study. No adenopathy is evident in the abdomen or pelvis. Reproductive: Uterus is anteverted. There is an apparent leiomyoma extending from the leftward aspect of the uterus anteriorly measuring 4.3 x 4.0 cm. No extrauterine pelvic mass evident. Other: Appendix appears normal. No abscess is seen in the abdomen or pelvis. No free pelvic fluid seen. Loculated fluid adjacent to the pancreas is noted. Musculoskeletal: There is degenerative change in the lumbar spine. There are no blastic or lytic bone lesions. There is no intramuscular or abdominal wall lesion evident. IMPRESSION: 1. Acute pancreatitis with pancreatic edema and peripancreatic fluid, most notably anterior to the body and tail of the pancreas. No well-defined pseudocyst or noncystic mass. No pancreatic  duct dilatation. 2. Areas of gastritis and duodenitis due to adjacent pancreatitis causing inflammation in these areas. No bowel obstruction. No abscess. Appendix appears normal. 3. 1 mm nonobstructing calculus upper pole left kidney. No hydronephrosis or ureteral calculus on either side. 4.  Hepatic steatosis. 5. 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 Electronically Signed   By: Bretta Bang III M.D.   On: 11/19/2017 12:04     LOS: 16 days  Jeoffrey Massed, MD  [facility] Pager:[phone]  If 7PM-7AM, please contact night-coverage [email or web address] Password [email or web address] 11/19/2017, 3:13 PM

## 2017-11-19 NOTE — Progress Notes (Signed)
PT Cancellation Note  Patient Details Name: Jannet MantisJoyce Doutt MRN: [id number] DOB: Aug 13, 1961   Cancelled Treatment:     pt out of room at Radiology for chest and ABD.  Will attempt another day as schedule permits.   Felecia ShellingLori Asante Blanda  PTA [facility]  Acute  Rehab Pager      [phone]

## 2017-11-19 NOTE — Progress Notes (Signed)
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 CT suggesting acute necrotizing pancreatitis with 1 mm stone in the left kidney. Pt developed VDRF, weaned 11/09/17, also was on CRRT.    OT comments  Pain and fatigue limiting pt this day.  Rn aware  Follow Up Recommendations  SNF;Supervision/Assistance - 24 hour    Equipment Recommendations  Other (comment)(TBD at next venue)           Mobility Bed Mobility   Bed Mobility: Rolling Rolling: Max assist Sidelying to sit: HOB elevated;Max assist     Sit to sidelying: Max assist    Transfers        not able                  ADL either performed or assessed with clinical judgement   ADL Overall ADL's : Needs assistance/impaired Eating/Feeding: Bed level;Set up Eating/Feeding Details (indicate cue type and reason): HOB raised Grooming: Bed level;Minimal assistance Grooming Details (indicate cue type and reason): HOB raised                               General ADL Comments: Pt agreed to sit EOB. Pt sat briefly and need to lie down almost immediately.  Pt with pain in abdomen and fatigue. RN aware.               Cognition Arousal/Alertness: Awake/alert   Overall Cognitive Status: Within Functional Limits for tasks assessed                                                General Comments      Pertinent Vitals/ Pain       Pain Score: 6  Pain Location: L lower abdomen Pain Descriptors / Indicators: Discomfort;Sore Pain Intervention(s): Limited activity within patient's tolerance;Repositioned  Home Living Family/patient expects to be discharged to:: Skilled nursing facility                                            Frequency  Min 2X/week        Progress  Toward Goals  OT Goals(current goals can now be found in the care plan section)  Progress towards OT goals: OT to reassess next treatment  Acute Rehab OT Goals Patient Stated Goal: to rehab and then home  Plan         AM-PAC PT "6 Clicks" Daily Activity     Outcome Measure   Help from another person eating meals?: None Help from another person taking care of personal grooming?: A Little Help from another person toileting, which includes using toliet, bedpan, or urinal?: Total Help from another person bathing (including washing, rinsing, drying)?: A Lot Help from another person to put on and taking off regular upper body clothing?: A Lot Help from another person to put on and taking off regular lower body clothing?: Total 6 Click Score: 13    End of Session    OT Visit Diagnosis: Other abnormalities of gait and mobility (R26.89);Pain Pain -  part of body: (left lower back)   Activity Tolerance Patient limited by pain;Patient limited by lethargy   Patient Left in bed;with call bell/phone within reach;with family/visitor present;with bed alarm set   Nurse Communication Mobility status        Time: 1610-9604 OT Time Calculation (min): 23 min  Charges: OT General Charges $OT Visit: 1 Visit OT Treatments $Self Care/Home Management : 8-22 mins  [place], [place] [phone]   Einar Crow D 11/19/2017, 2:31 PM

## 2017-11-19 NOTE — Progress Notes (Signed)
CSW met with patient at bedside, pt. Too weak and referred CSW to talk with her friend Fraser Din about SNF bed options and choice.  CSW spoke with Fraser Din via phone. Patient and friend are both agreeable to [facility] and rehab for post acute care.  CSW called SNF rehab start [facility] authorization due to lengthy process. CSW will continue to follow patient.  Kathrin Greathouse, Latanya Presser, MSW Clinical Social Worker  [phone] 11/19/2017  9:53 AM

## 2017-11-19 NOTE — Progress Notes (Signed)
PHARMACY - ADULT TOTAL PARENTERAL NUTRITION CONSULT NOTE   Pharmacy Consult for TPN Indication: severe pancreatitis  Patient Measurements: Height: _0  (175.3 cm) Weight: (!) 314 lb 6 oz (142.6 kg) IBW/kg (Calculated) : 66.2 TPN AdjBW (KG): 83.2 Body mass index is 46.43 kg/m.  Insulin Requirements: 22 units Novolog (moderate scale) in previous 24hrs CL diet likely increased CBGs  Current Nutrition: back to CL diet  TF stopped 1/22, did not tolerate FL diet 1/29  IVF: 1/2NS at 58m/hr  Central access: R IJ HD catheter TPN start date: 1/24  ASSESSMENT                                                                                                          HPI: 57y.o. Female with gallstone pancreatitis with necrosis s/p MRCP on 1/15.  Repeat LFTs and lipase normalizing.  No plans for surgical intervention at this time.  Tube feeding was started but had to be stopped on 1/22 due to nausea/vomiting.  Pharmacy is consulted to begin TPN. Patient's course has been complicated with respiratory failure and intubation and subsequent extubation on 11/09/2017.  She was also started on CRRT from 1/18-1/23.  Significant events:  1/28: CL diet 1/29: FL diet 1/30; back to CL diet, weight incr 305 > 314  Today: 11/19/2017   Glucose: No hx DM. CBGs elevated since starting TPN but improved slightly with addition on insulin to TPN (CBG 195-134). Po intake will increase CBG's dependent on volume  Electrolytes: CoCa 9.5, Na 143, K 3.7, Mag 1.8, Phos 4.5  Renal: AKI - CRRT 1/18-1/23. SCr elevated but improving off CRRT, UOP 2450cc yesterday.    LFTs: AST/ALT elevated ~2x ULN, Alk phos increasing above NL, Tbili WNL  TGs: 213 (1/15), 332 (1/18), improving 276 (1/28)  Prealbumin: 6.7 (1/25), 9.3 (1/28)  NUTRITIONAL GOALS                                                                                             RD recs 1/22: 2270-2405 kcal, >/= 135 grams protein per day Clinimix 5/15 at a goal  rate of 1149mhr will provide 132g/day protein, 1874Kcal/day. After 7 days, the addition of 20% fat emulsion at 2060mr to provide 132g/day protein, 2354Kcal/day.  **Patient may not be able to tolerate this amount of fluid given current renal function.  PLAN  At 1800 today:  Increase Clinimix 5/15 (no electrolytes d/t elevated Mg/SCr) to 66m/hr.  Held 20% lipid emulsion for first 7 days for ICU patients per ASPEN guidelines.  Pt now out of ICU most recent TG = 276.  Continue to hold lipids with pancreatitis, and advancing diet   Increasing TPN rate with diet intolerance  TPN to contain standard multivitamins and trace elements  Increase Insulin in TPN to 40 units per day  IVF per MD   Continue SSI q4h MODERATE scale  TPN lab panels on Mondays & Thursdays.  F/u daily.   GMinda DittoPharmD Pager [phone]/30/2019, 10:40 AM

## 2017-11-19 NOTE — Progress Notes (Signed)
Patient ID: Emma Washington, female   DOB: [birth year], 57 y.o.   MRN: [id number]    Progress Note   Subjective   Feels worse since yesterday . Unable to tolerate diet advancement-had nausea and vomiting last night. Feels very weak -says" I dont think they know how much help I need " Had Bm yesterday  Feels mildly SOB- unable to sit up on her own   Objective   Vital signs in last 24 hours: Temp:  [97.6 F (36.4 C)-99.5 F (37.5 C)] 99 F (37.2 C) (01/30 0551) Pulse Rate:  [96-106] 106 (01/30 0551) Resp:  [24-36] 28 (01/30 0551) BP: (145)/(69-84) 145/75 (01/30 0551) SpO2:  [92 %-96 %] 92 % (01/30 0551) Weight:  [314 lb 6 oz (142.6 kg)] 314 lb 6 oz (142.6 kg) (01/30 0551) Last BM Date: 11/18/17 General:   Older  white female in NAD, ill appearing mouth, tongue dry and cracked Heart:   Tachy Regular rate and rhythm; no murmurs Lungs: Respirations even decreased BS bilat lower  Abdomen:  Distended and tense  BS quiet  + tympany, diffusely tender Extremities:  Without edema. Neurologic:  Alert and oriented,  grossly normal neurologically. Psych:  Cooperative. Normal mood and affect.  Intake/Output from previous day: 01/29 0701 - 01/30 0700 In: 2859.3 [P.O.:1000; I.V.:1859.3] Out: 1900 [Urine:1550] Intake/Output this shift: No intake/output data recorded.  Lab Results: Recent Labs    11/17/17 0452 11/19/17 0402  WBC 20.1* 18.7*  HGB 8.2* 8.1*  HCT 25.5* 25.5*  PLT 559* 470*   BMET Recent Labs    11/17/17 0452 11/18/17 0517 11/19/17 0402  NA 149* 145 143  K 3.7 3.7 3.7  CL 119* 117* 114*  CO2 21* 23 22  GLUCOSE 169* 162* 162*  BUN 58* 56* 50*  CREATININE 1.81* 1.79* 1.60*  CALCIUM 7.4* 7.6* 7.7*   LFT Recent Labs    11/19/17 0402  PROT 5.8*  ALBUMIN 1.8*  AST 83*  ALT 89*  ALKPHOS 182*  BILITOT 0.6       Assessment / Plan:    #1 57 yo WF with severe necrotizing gallstone pancreatitis - day #16 hospital Persistent leukocytosis , unable to tolerate  diet - N/V yesterday . Abdomen distended , quiet, tense  R/O ileus , vs third spaced fluid , ? Progressive inflammatory process in abdomen    Last CT 1/22-   Will chek KUB this am , may need repeat CT Back to sips clears  No current antibiotics To ill to undergo cholecystectomy this admit , no plans for ERCP at present   #2 malnutrition- secondary to above - continue TPN  #3 AKI - gradually improving #4 anemia - stable #5 severe deconditioning- will need rehab post hosopital #6 S/p vent dependent resp failure   Contact  Amy Esterwood, P.A.-C               [phone]  Amy Esterwood  11/19/2017, 9:03 AM   ________________________________________________________________________  [facility] MD note:  I personally examined the patient, reviewed the data and agree with the assessment and plan described above.  She didn't tolerate full liquids and so back to clears for now.  Still very weak and deconditioned.  Will probably be several weeks before she is strong enough for cholecystectomy.   Will follow along.   Rob Buntinganiel Cole Eastridge, MD [facility] Pager [phone]

## 2017-11-20 LAB — COMPREHENSIVE METABOLIC PANEL
ALK PHOS: 160 U/L — AB (ref 38–126)
ALT: 58 U/L — AB (ref 14–54)
AST: 38 U/L (ref 15–41)
Albumin: 1.9 g/dL — ABNORMAL LOW (ref 3.5–5.0)
Anion gap: 6 (ref 5–15)
BUN: 59 mg/dL — AB (ref 6–20)
CALCIUM: 7.9 mg/dL — AB (ref 8.9–10.3)
CHLORIDE: 114 mmol/L — AB (ref 101–111)
CO2: 20 mmol/L — AB (ref 22–32)
Creatinine, Ser: 1.97 mg/dL — ABNORMAL HIGH (ref 0.44–1.00)
GFR calc Af Amer: 32 mL/min — ABNORMAL LOW (ref >60)
GFR calc non Af Amer: 27 mL/min — ABNORMAL LOW (ref >60)
GLUCOSE: 180 mg/dL — AB (ref 65–99)
Potassium: 3.9 mmol/L (ref 3.5–5.1)
SODIUM: 140 mmol/L (ref 135–145)
Total Bilirubin: 0.4 mg/dL (ref 0.3–1.2)
Total Protein: 5.9 g/dL — ABNORMAL LOW (ref 6.5–8.1)

## 2017-11-20 LAB — PHOSPHORUS: PHOSPHORUS: 5.3 mg/dL — AB (ref 2.5–4.6)

## 2017-11-20 LAB — GLUCOSE, CAPILLARY
GLUCOSE-CAPILLARY: 174 mg/dL — AB (ref 65–99)
GLUCOSE-CAPILLARY: 145 mg/dL — AB (ref 65–99)
GLUCOSE-CAPILLARY: 165 mg/dL — AB (ref 65–99)
GLUCOSE-CAPILLARY: 189 mg/dL — AB (ref 65–99)
Glucose-Capillary: 142 mg/dL — ABNORMAL HIGH (ref 65–99)

## 2017-11-20 LAB — MAGNESIUM: Magnesium: 1.8 mg/dL (ref 1.7–2.4)

## 2017-11-20 MED ORDER — CHLORHEXIDINE GLUCONATE CLOTH 2 % EX PADS
6.0000 | MEDICATED_PAD | Freq: Every day | CUTANEOUS | Status: DC
  Administered 2017-11-20 – 2017-12-05 (×16): 6 via TOPICAL

## 2017-11-20 MED ORDER — FENTANYL CITRATE (PF) 100 MCG/2ML IJ SOLN
75.0000 ug | INTRAMUSCULAR | Status: DC | PRN
  Administered 2017-11-20 – 2017-11-21 (×12): 75 ug via INTRAVENOUS
  Filled 2017-11-20 (×12): qty 2

## 2017-11-20 MED ORDER — FENTANYL CITRATE (PF) 100 MCG/2ML IJ SOLN
50.0000 ug | Freq: Once | INTRAMUSCULAR | Status: AC
  Administered 2017-11-20: 50 ug via INTRAVENOUS
  Filled 2017-11-20: qty 2

## 2017-11-20 MED ORDER — TRACE MINERALS CR-CU-MN-SE-ZN 10-1000-500-60 MCG/ML IV SOLN
INTRAVENOUS | Status: AC
  Administered 2017-11-20: 18:00:00 via INTRAVENOUS
  Filled 2017-11-20 (×2): qty 1440

## 2017-11-20 MED ORDER — BOOST / RESOURCE BREEZE PO LIQD CUSTOM
1.0000 | Freq: Two times a day (BID) | ORAL | Status: DC
  Administered 2017-11-21 – 2017-11-25 (×6): 1 via ORAL

## 2017-11-20 NOTE — Progress Notes (Addendum)
Nutrition Follow-up  DOCUMENTATION CODES:   Morbid obesity  INTERVENTION:  - Continue to encourage PO intakes. - Will order Boost Breeze BID, each supplement provides 250 kcal and 9 grams of protein. - Continued diet advancement as medically feasible.  - TPN per Pharmacy.  NUTRITION DIAGNOSIS:   Inadequate oral intake related to inability to eat as evidenced by NPO status. 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 Other associated symptoms include nausea, vomiting and decreased PO intake (since 1/13). Pertinent PMHx significant for nephrolithiasis.   Significant Events: 1/15- MRCP and CXR 1/18- intubated around 3:00 AM and plan for OGT placement and trickle TF initiation 1/19- CRRT started 1/20- extubated and OGT removed 1/21- Panda placed in R nare 1/22- TF stopped at 5:00 AM d/t N/V 1/23- plan to hold CRRT x1-2 days and assess for renal recovery 1/24- TPN initiation; ILE to be held x7 days 1/25: Pt pulled NGT, refused replacement   Weight has been fluctuating with various changes throughout admission. She has a double lumen PICC to R cephalic and TPN changed to electrolyte-free formulation starting tonight: Clinimix 5/15 @ 60 mL/hr which provides 72 grams of protein and 1022 kcal.   Pt to be on BiPAP at night. MD notes state ongoing significant edema in BLE despite Lasix yesterday. Plan for repeat CT in setting of severe necrotizing gallstone pancreatitis.   Medications reviewed; sliding scale Novolog, IV Protonix BID.  Labs reviewed; CBGs: 174, 145, and 165 mg/dL today, Cl: 409114 mmol/L, BUN: 59 mg/dL, creatinine: 8.111.97 mg/dL, Ca: 7.9 mg/dL, Phos: 5.3 mg/dL, GFR: 27 mL/min.       Diet Order:  Diet clear liquid Room service appropriate? Yes; Fluid consistency: Thin TPN (CLINIMIX) Adult without lytes TPN (CLINIMIX) Adult without lytes  EDUCATION NEEDS:   No education needs have been identified at this time  Skin:  Skin Assessment: Reviewed RN Assessment  Last BM:  1/30  Height:   Ht Readings from Last 1 Encounters:  11/19/17 5\' 10"  (1.778 m)    Weight:   Wt Readings from Last 1 Encounters:  11/20/17 (!) 304 lb 10.8 oz (138.2 kg)    Ideal Body Weight:  65.9 kg  BMI:  Body mass index is 43.72 kg/m.  Estimated Nutritional Needs:   Kcal:  9147-82952270-2405   Protein:  >/= 135 grams  Fluid:  >/= 1.8 L/day      Trenton GammonJessica Marka Treloar, MS, RD, LDN, [facility] Inpatient Clinical Dietitian Pager # [phone] After hours/weekend pager # [phone]

## 2017-11-20 NOTE — Progress Notes (Signed)
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 CT suggesting acute necrotizing pancreatitis with 1 mm stone in the left kidney. Pt developed VDRF, weaned 11/09/17, also was on CRRT.     PT Comments    Pt agreeable for OOB to recliner today.  Encouraged slow mobility with slow breathing exercises as pt appears very anxious.  RN aware of pt up in recliner and to check on pain meds.  Follow Up Recommendations  SNF     Equipment Recommendations  Rolling walker with 5" wheels    Recommendations for Other Services       Precautions / Restrictions Precautions Precautions: Fall Precaution Comments: monitor O2, RR     Mobility  Bed Mobility Overal bed mobility: Needs Assistance       Supine to sit: Mod assist;HOB elevated     General bed mobility comments: pt able to bring LEs over EOB, requesting assist for upper body, provided bil UE support  Transfers Overall transfer level: Needs assistance Equipment used: Rolling walker (2 wheeled) Transfers: Sit to/from [employer] CorporationStand;Stand Pivot Transfers Sit to Stand: Min assist Stand pivot transfers: Min assist       General transfer comment: verbal cues for hand placement, assist for steadying, wide BOS, encouraged increased time and slowing breathing, RR up to 44, SpO2 remained in 90s on 4L O2 [place]  Ambulation/Gait                 Stairs            Wheelchair Mobility    Modified Rankin (Stroke Patients Only)       Balance Overall balance assessment: Needs assistance         Standing balance support: Bilateral upper extremity supported Standing balance-Leahy Scale: Poor                              Cognition Arousal/Alertness: Awake/alert Behavior During Therapy: Anxious Overall Cognitive Status: Within  Functional Limits for tasks assessed                                        Exercises      General Comments        Pertinent Vitals/Pain Pain Assessment: 0-10 Pain Score: 7  Pain Location: lower abdomen Pain Descriptors / Indicators: Discomfort;Sore Pain Intervention(s): Repositioned;Limited activity within patient's tolerance;Monitored during session;Patient requesting pain meds-RN notified    Home Living                      Prior Function            PT Goals (current goals can now be found in the care plan section) Progress towards PT goals: Progressing toward goals    Frequency    Min 3X/week      PT Plan Current plan remains appropriate    Co-evaluation              AM-PAC PT "6 Clicks" Daily Activity  Outcome Measure  Difficulty turning over in bed (including adjusting bedclothes, sheets and blankets)?: A Lot Difficulty moving from lying on back to sitting on the side of the bed? : Unable Difficulty sitting down on and  standing up from a chair with arms (e.g., wheelchair, bedside commode, etc,.)?: Unable Help needed moving to and from a bed to chair (including a wheelchair)?: A Little Help needed walking in hospital room?: A Lot Help needed climbing 3-5 steps with a railing? : Total 6 Click Score: 10    End of Session Equipment Utilized During Treatment: Gait belt Activity Tolerance: Patient limited by pain;Treatment limited secondary to medical complications (Comment) Patient left: with call bell/phone within reach;in chair;with family/visitor present Nurse Communication: Mobility status(aware pt up in chair, notified of pt's worry of pain med timing) PT Visit Diagnosis: Muscle weakness (generalized) (M62.81);Difficulty in walking, not elsewhere classified (R26.2)     Time: 0946-1000 PT Time Calculation (min) (ACUTE ONLY): 14 min  Charges:  $Therapeutic Activity: 8-22 mins                    G Codes:       Zenovia Jarred, PT, DPT 11/20/2017 Pager: [phone]  Maida Sale E 11/20/2017, 12:10 PM

## 2017-11-20 NOTE — Progress Notes (Signed)
Date: November 20, 2017 Marcelle SmilingRhonda Davis, BSN, [place]3, [place] [phone] Chart and notes review for patient progress and needs./Patient transferred to sdu pm of [id number] due to severe volume overload, required bipap overnight and iv lasix. Will follow for case management and discharge needs. No cm or discharge needs present at time of this review. Next review date: [id number]

## 2017-11-20 NOTE — Progress Notes (Addendum)
PHARMACY - ADULT TOTAL PARENTERAL NUTRITION CONSULT NOTE   Pharmacy Consult for TPN Indication: severe pancreatitis  Patient Measurements: Height: _0  (177.8 cm) Weight: (!) 304 lb 10.8 oz (138.2 kg) IBW/kg (Calculated) : 68.5 TPN AdjBW (KG): 85.3 Body mass index is 43.72 kg/m.  Insulin Requirements: 15 units Novolog (moderate scale) in previous 24hrs CL diet likely increased CBGs  Current Nutrition: back to CL diet 1/30, TPN not yet at goal  IVF: 1/2 NS at [facility], asked RN if this can be saline locked  Central access: R IJ HD catheter TPN start date: 1/24  ASSESSMENT                                                                                                          HPI: 57 y.o. Female with gallstone pancreatitis with necrosis s/p MRCP on 1/15.  Repeat LFTs and lipase normalizing.  No plans for surgical intervention at this time.  Tube feeding was started but had to be stopped on 1/22 due to nausea/vomiting.  Pharmacy is consulted to begin TPN. Patient's course has been complicated with respiratory failure and intubation and subsequent extubation on 11/09/2017.  She was also started on CRRT from 1/18-1/23.  Significant events:  1/28: CL diet 1/29: FL diet 1/30: did not tolerate FLD, back to CLD, weight incr 305 > 314  Today: 11/20/2017   Glucose: No hx DM. CBGs elevated since starting TPN but improved slightly with addition on insulin to TPN (CBG 145-174).   Electrolytes: Phos increased to 5.3. No electrolytes in TPN. CorrCa 9.58 for Phos-Calcium product of 50.7. K and Mag ok.  Renal: AKI - CRRT 1/18-1/23. SCr remains elevated off CRRT, UOP 2400cc yesterday.    LFTs: AST/ALT elevated but improving, Alk phos elevated, Tbili WNL  TGs: 213 (1/15), 332 (1/18), improving 276 (1/28)  Prealbumin: 6.7 (1/25), 9.3 (1/28)  NUTRITIONAL GOALS                                                                                           RD recs 1/22: 2270-2405 kcal, >/= 135 grams  protein per day Clinimix 5/15 at a goal rate of 147m/hr will provide 132g/day protein, 1874Kcal/day. After 7 days, the addition of 20% fat emulsion at 232mhr to provide 132g/day protein, 2354Kcal/day.  NOTE Patient may not be able to tolerate this amount of fluid given current renal function.  PLAN  At 1800 today:  Keep Clinimix 5/15 (no electrolytes) at 94m/hr today per discussion with MD. Patient is volume overloaded with dyspnea, tachypnea which required transition to ICU-SD last night.  Will hold off advancing TPN to avoid extra fluid in current status. Will re-evaluate and discuss with MD in AM. Patient has high protein needs, critical for wound healing.  Held 20% lipid emulsion for first 7 days for ICU patients per ASPEN guidelines. Able to resume but will hold off for now given elevated triglycerides, pancreatitis, and trying to avoid providing extra volume.  TPN to contain standard multivitamins and trace elements  Increase Insulin in TPN to 50 units per day  Continue SSI q4h MODERATE scale  TPN lab panels on Mondays & Thursdays. BMet, Mag, Phos in AM to trend electrolytes without lytes in TPN.  F/u daily.   AHershal Coria PharmD, BCPS Pager: [phone] 39101/31/2019 10:54 AM

## 2017-11-20 NOTE — Progress Notes (Signed)
Pt not on BIPAP at this time. No distressed noted.

## 2017-11-20 NOTE — Progress Notes (Signed)
PROGRESS NOTE        PATIENT DETAILS Name: Emma Washington Age: 57 y.o. Sex: female Date of Birth: 1961-01-02 Admit Date: 11/20/2017 Admitting Physician Albertine Grates, MD PCP:No primary care provider on file.  Brief Narrative: Patient is a 57 y.o. 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  She briefly required CRRT, subsequently extubated and transferred to try to hospitalist service.  She slowly improving-but continues to havee abdominal pain, massive volume overload, and shortness of breath.  She was transferred to stepdown unit on 1/30 respiratory distress felt to be due to volume overload-given diuretics and briefly started on BiPAP.  See below for further details  Subjective: Appears more comfortable than yesterday-less tachypnea.  Abdominal pain seems to be somewhat controlled after adjusting her narcotic regimen.  Assessment/Plan: Severe necrotizing gallstone pancreatitis: Has had some improvement-but still continues to have significant abdominal pain-unable to tolerate full liquids-and downgraded back to clear liquids on 1/30.  Continues to have upper abdominal tenderness without any peritoneal signs.  Continue transdermal fentanyl 75 mcg every 72 hours, and IV fentanyl 50 mcg for breakthrough as needed.  Appreciate GI/general surgery input-noted plans to repeat CT abdomen sometime next week.  Continue TNA and clear liquid diet for now.  Acute kidney injury: Likely hemodynamically mediated in the setting of pancreatitis-briefly required CRRT during the early hospital course.  She still remains volume overloaded-given Lasix yesterday-as she developed respiratory distress/tachypnea-creatinine slightly elevated-we will hold off for now and dose intermittently.  Continue to follow weights/intake output. Acute  hypoxemic respiratory failure:  Acute hypoxic respiratory failure: Suspect multifactorial-secondary to volume overload/anxiety/ pain-given Lasix on 1/30 and briefly required BiPAP at bedside.  Her respiratory status has improved this morning-she is now on oxygen via nasal cannula.  Suspect could have OHS/OSA at baseline-we will write for BiPAP nightly while she is inpatient.  Anemia of critical illness: Hemoglobin currently stable-no indication for transfusion at this time  Volume overload: Secondary to acute illness/IV fluid resuscitation/hypoalbuminemia-given Lasix yesterday-continues to have significant edema in her lower extremities-and significant weight gain since admission.  See above regarding plans for diuresis.   Morbid obesity  Suspected OHS/OSA: Outpatient sleep study  DVT Prophylaxis: Prophylactic Lovenox   Code Status: Full code  Family Communication: None at bedside  Disposition Plan: Remain inpatient-remain in stepdown for another 24 hours.  Antimicrobial agents: Anti-infectives (From admission, onward)   Start     Dose/Rate Route Frequency Ordered Stop   11/12/17 1800  meropenem (MERREM) 1 g in sodium chloride 0.9 % 100 mL IVPB  Status:  Discontinued     1 g 200 mL/hr over 30 Minutes Intravenous Every 8 hours 11/12/17 1115 11/17/17 0954   11/11/17 1000  meropenem (MERREM) 1 g in sodium chloride 0.9 % 100 mL IVPB  Status:  Discontinued     1 g 200 mL/hr over 30 Minutes Intravenous Every 12 hours 11/11/17 0911 11/12/17 1115   11/09/17 1000  piperacillin-tazobactam (ZOSYN) IVPB 3.375 g  Status:  Discontinued     3.375 g 12.5 mL/hr over 240 Minutes Intravenous Every 8 hours 11/09/17 0614 11/09/17 0615   11/09/17 1000  piperacillin-tazobactam (ZOSYN) IVPB 3.375 g  Status:  Discontinued     3.375 g 100 mL/hr over 30 Minutes Intravenous Every 6 hours  11/09/17 0616 11/11/17 0911   11/07/17 2200  piperacillin-tazobactam (ZOSYN) IVPB 2.25 g  Status:  Discontinued      2.25 g 100 mL/hr over 30 Minutes Intravenous Every 8 hours 11/07/17 1218 11/07/17 1741   11/07/17 2200  piperacillin-tazobactam (ZOSYN) IVPB 3.375 g  Status:  Discontinued     3.375 g 12.5 mL/hr over 240 Minutes Intravenous Every 6 hours 11/07/17 1741 11/07/17 1747   11/07/17 2200  piperacillin-tazobactam (ZOSYN) IVPB 3.375 g  Status:  Discontinued     3.375 g 100 mL/hr over 30 Minutes Intravenous Every 6 hours 11/07/17 1747 11/09/17 0613   11/04/17 1100  piperacillin-tazobactam (ZOSYN) IVPB 3.375 g  Status:  Discontinued     3.375 g 12.5 mL/hr over 240 Minutes Intravenous Every 8 hours 11/04/17 1059 11/07/17 1218   11/20/2017 1200  piperacillin-tazobactam (ZOSYN) IVPB 3.375 g     3.375 g 100 mL/hr over 30 Minutes Intravenous  Once 10/25/2017 1157 10/26/2017 1309      Procedures: ETT 1/18 >> 1/20  CONSULTS:  pulmonary/intensive care, GI and general surgery  Time spent: 25 minutes-Greater than 50% of this time was spent in counseling, explanation of diagnosis, planning of further management, and coordination of care.  MEDICATIONS: Scheduled Meds: . chlorhexidine  15 mL Mouth Rinse BID  . Chlorhexidine Gluconate Cloth  6 each Topical Daily  . fentaNYL  75 mcg Transdermal Q72H  . insulin aspart  0-15 Units Subcutaneous Q4H  . mouth rinse  15 mL Mouth Rinse q12n4p  . pantoprazole (PROTONIX) IV  40 mg Intravenous Q12H  . sodium chloride flush  10-40 mL Intracatheter Q12H   Continuous Infusions: . sodium chloride 10 mL/hr at 11/20/17 0400  . TPN (CLINIMIX) Adult without lytes 60 mL/hr at 11/20/17 0400  . TPN (CLINIMIX) Adult without lytes     PRN Meds:.acetaminophen, albuterol, fentaNYL (SUBLIMAZE) injection, haloperidol lactate, lip balm, naLOXone (NARCAN)  injection, ondansetron (ZOFRAN) IV, sodium chloride flush, sodium chloride flush   PHYSICAL EXAM: Vital signs: Vitals:   11/20/17 0400 11/20/17 0750 11/20/17 0800 11/20/17 0900  BP: (!) 151/61  137/69   Pulse: 97  (!) 104 95    Resp: (!) 25  (!) 33 (!) 23  Temp:  98.7 F (37.1 C)    TempSrc:  Axillary    SpO2: 96%  96% 96%  Weight:      Height:       Filed Weights   11/19/17 0551 11/19/17 1825 11/20/17 0248  Weight: (!) 142.6 kg (314 lb 6 oz) 135.9 kg (299 lb 9.7 oz) (!) 138.2 kg (304 lb 10.8 oz)   Body mass index is 43.72 kg/m.   General appearance :Awake, alert, not in any distress.  Eyes:, pupils equally reactive to light and accomodation,no scleral icterus. HEENT: Atraumatic and Normocephalic Neck: supple, no JVD. Resp:Good air entry bilaterally-no added sounds anteriorly CVS: S1 S2 regular, no murmurs.  GI: Bowel sounds present, tender in the epigastric area abdomen is obese and slightly distended.  Extremities: B/L Lower Ext shows ++ edema, both legs are warm to touch Neurology:  speech clear,Non focal, sensation is grossly intact. Psychiatric: Normal judgment and insight. Normal mood. Musculoskeletal:No digital cyanosis Skin:No Rash, warm and dry Wounds:N/A  I have personally reviewed following labs and imaging studies  LABORATORY DATA: CBC: Recent Labs  Lab 11/14/17 0626 11/16/17 0444 11/17/17 0452 11/19/17 0402  WBC 25.0* 20.1* 20.1* 18.7*  NEUTROABS 21.9*  --  17.9*  --   HGB 8.2* 8.2* 8.2* 8.1*  HCT  25.6* 25.8* 25.5* 25.5*  MCV 93.8 95.2 95.1 94.1  PLT 403* 541* 559* 470*    Basic Metabolic Panel: Recent Labs  Lab 11/14/17 0626 11/15/17 0249 11/16/17 0444 11/17/17 0452 11/18/17 0517 11/19/17 0402 11/20/17 0448  NA 147* 144 147* 149* 145 143 140  K 4.0 4.3 3.9 3.7 3.7 3.7 3.9  CL 112* 112* 116* 119* 117* 114* 114*  CO2 24 24 25  21* 23 22 20*  GLUCOSE 199* 185* 183* 169* 162* 162* 180*  BUN 60* 62* 61* 58* 56* 50* 59*  CREATININE 2.27* 2.30* 2.09* 1.81* 1.79* 1.60* 1.97*  CALCIUM 6.9* 7.0* 7.1* 7.4* 7.6* 7.7* 7.9*  MG 2.7*  --   --  2.1  --  1.8 1.8  PHOS 4.3  4.4 4.4 4.4 4.1  --  4.5 5.3*    GFR: Estimated Creatinine Clearance: 49.1 mL/min (A) (by C-G formula  based on SCr of 1.97 mg/dL (H)).  Liver Function Tests: Recent Labs  Lab 11/14/17 0626 11/15/17 0249 11/16/17 0444 11/17/17 0452 11/19/17 0402 11/20/17 0448  AST 57*  --   --  92* 83* 38  ALT 50  --   --  72* 89* 58*  ALKPHOS 115  --   --  129* 182* 160*  BILITOT 0.6  --   --  0.5 0.6 0.4  PROT 5.4*  --   --  5.8* 5.8* 5.9*  ALBUMIN 1.9*  1.8* 1.9* 1.9* 1.9* 1.8* 1.9*   No results for input(s): LIPASE, AMYLASE in the last 168 hours. No results for input(s): AMMONIA in the last 168 hours.  Coagulation Profile: No results for input(s): INR, PROTIME in the last 168 hours.  Cardiac Enzymes: No results for input(s): CKTOTAL, CKMB, CKMBINDEX, TROPONINI in the last 168 hours.  BNP (last 3 results) No results for input(s): PROBNP in the last 8760 hours.  HbA1C: No results for input(s): HGBA1C in the last 72 hours.  CBG: Recent Labs  Lab 11/19/17 1635 11/19/17 1941 11/19/17 2319 11/20/17 0348 11/20/17 0804  GLUCAP 153* 148* 156* 174* 145*    Lipid Profile: No results for input(s): CHOL, HDL, LDLCALC, TRIG, CHOLHDL, LDLDIRECT in the last 72 hours.  Thyroid Function Tests: No results for input(s): TSH, T4TOTAL, FREET4, T3FREE, THYROIDAB in the last 72 hours.  Anemia Panel: No results for input(s): VITAMINB12, FOLATE, FERRITIN, TIBC, IRON, RETICCTPCT in the last 72 hours.  Urine analysis:    Component Value Date/Time   COLORURINE AMBER (A) 11/06/2017 1245   APPEARANCEUR CLOUDY (A) 11/06/2017 1245   LABSPEC 1.018 11/06/2017 1245   PHURINE 5.0 11/06/2017 1245   GLUCOSEU 50 (A) 11/06/2017 1245   HGBUR LARGE (A) 11/06/2017 1245   BILIRUBINUR NEGATIVE 11/06/2017 1245   KETONESUR NEGATIVE 11/06/2017 1245   PROTEINUR 30 (A) 11/06/2017 1245   NITRITE NEGATIVE 11/06/2017 1245   LEUKOCYTESUR NEGATIVE 11/06/2017 1245    Sepsis Labs: Lactic Acid, Venous No results found for: LATICACIDVEN  MICROBIOLOGY: Recent Results (from the past 240 hour(s))  Culture, blood  (routine x 2)     Status: None   Collection Time: 11/11/17 10:05 AM  Result Value Ref Range Status   Specimen Description BLOOD RIGHT ANTECUBITAL  Final   Special Requests   Final    BOTTLES DRAWN AEROBIC AND ANAEROBIC Blood Culture adequate volume   Culture   Final    NO GROWTH 5 DAYS Performed at [facility], [address] [place], [place] [address]    Report Status 11/16/2017 FINAL  Final  Culture, blood (routine x 2)     Status: None   Collection Time: 11/11/17 10:22 AM  Result Value Ref Range Status   Specimen Description BLOOD RIGHT ANTECUBITAL  Final   Special Requests   Final    BOTTLES DRAWN AEROBIC ONLY Blood Culture adequate volume   Culture   Final    NO GROWTH 5 DAYS Performed at [facility], [address], [place], [place] [address]    Report Status 11/16/2017 FINAL  Final    RADIOLOGY STUDIES/RESULTS: Ct Abdomen Pelvis Wo Contrast  Result Date: 11/11/2017 CLINICAL DATA:  57 year old female with a history of necrotizing pancreatitis now with worsening leukocytosis. Evaluate for evidence of superinfection. EXAM: CT ABDOMEN AND PELVIS WITHOUT CONTRAST TECHNIQUE: Multidetector CT imaging of the abdomen and pelvis was performed following the standard protocol without IV contrast. COMPARISON:  Prior CT scan of the abdomen and pelvis 11/18/2017 FINDINGS: Lower chest: Small bilateral pleural effusions and associated lower lobe atelectasis. The intracardiac blood pool is hypodense relative to the adjacent myocardium consistent with anemia. No pericardial effusion. Unremarkable distal thoracic esophagus. Hepatobiliary: Severe hypoattenuation of the hepatic parenchyma consistent with hepatic steatosis. There is some sparing around the gallbladder fossa. High attenuation material within the gallbladder consistent with sludge and/or small stones. No intra or extrahepatic biliary ductal dilatation. Pancreas: The pancreas is diffusely edematous. No discrete mass or  pseudocyst. Inflammatory stranding extends throughout the peripancreatic soft tissues and into the mesenteric root. The degree of peripancreatic inflammatory stranding has significantly progressed compared to 11/18/2017. Evaluation is limited in the absence of intravenous contrast. Spleen: Normal in size without focal abnormality. Adrenals/Urinary Tract: Adrenal glands are unremarkable. Kidneys are normal, without renal calculi, focal lesion, or hydronephrosis. Foley catheter in the bladder. Stomach/Bowel: No evidence of obstruction or focal bowel wall thickening. Normal appendix in the right lower quadrant. The terminal ileum is unremarkable. Vascular/Lymphatic: Limited evaluation in the absence of intravenous contrast. No aneurysm, significant atherosclerotic calcifications or suspicious lymphadenopathy. Reproductive: Uterus and bilateral adnexa are unremarkable. Other: Progressive inflammatory stranding throughout the mesenteric root and peripancreatic soft tissues. Small volume ascites along the greater curvature of the stomach, in the left perisplenic space and extending inferiorly along the left greater than right colic gutters to layer in the anatomic pelvis. No loculation or intra fluid gas to suggest infection. Musculoskeletal: No acute fracture or aggressive appearing lytic or blastic osseous lesion. IMPRESSION: 1. Progressive severe pancreatitis and secondary inflammatory changes throughout the peripancreatic soft tissues and mesenteric root. There is now small volume ascites which is likely reactive in nature. No loculation or intra fluid gas to suggest infection. 2. Small bilateral pleural effusions and associated atelectasis. 3. The intracardiac blood pool is hypodense relative to the adjacent myocardium consistent with anemia. 4. At least moderate hepatic steatosis. 5. Cholelithiasis. Electronically Signed   By: Malachy Moan M.D.   On: 11/11/2017 14:57   Dg Abd 1 View  Result Date:  11/10/2017 CLINICAL DATA:  NG tube placement Best obtainable notes due to patients size, pain and altered mental status. I could not get her to stop rocking and talking EXAM: ABDOMEN - 1 VIEW COMPARISON:  Earlier film of the same day FINDINGS: Weighted enteric tube tip is in the gastric body as before. Normal bowel gas pattern. The lower abdomen is is excluded. IMPRESSION: 1. Stable placement of feeding tube tip in the gastric body. Electronically Signed   By: Corlis Leak M.D.   On: 11/10/2017 19:29   Dg  Abd 1 View  Result Date: 11/07/2017 CLINICAL DATA:  Orogastric tube placement. EXAM: ABDOMEN - 1 VIEW COMPARISON:  None. FINDINGS: Orogastric tube extends into the stomach. Bowel gas pattern shows probable ileus. IMPRESSION: Orogastric tube extends into the stomach. Electronically Signed   By: Irish LackGlenn  Yamagata M.D.   On: 11/07/2017 15:23   [place] Abdomen Complete  Result Date: Jan 17, 2018 CLINICAL DATA:  Epigastric and right upper quadrant discomfort, syncope, vomiting, hematuria. EXAM: ABDOMEN ULTRASOUND COMPLETE COMPARISON:  Abdominal and pelvic CT scan of today's date FINDINGS: Gallbladder: Probable small stones. No gallbladder wall thickening or pericholecystic fluid or positive sonographic Murphy's sign. Common bile duct: Diameter: 6.7 mm Liver: Visualization of the hepatic parenchyma is limited by bowel poor penetration of the ultrasound beam. The echotexture is subjectively increased. There is no focal mass or ductal dilation. Portal vein is patent on color Doppler imaging with normal direction of blood flow towards the liver. IVC: No abnormality visualized. Pancreas: Visualization of the pancreas is limited due to bowel gas. Spleen: Size and appearance within normal limits. Right Kidney: Length: 13.2 cm. Echogenicity within normal limits. No mass or hydronephrosis visualized. Left Kidney: Length: 11.9 cm. Echogenicity within normal limits. No mass or hydronephrosis visualized. Abdominal aorta: Bowel gas  limits evaluation of the abdominal aorta. Other findings: No significant ascites is observed. IMPRESSION: The study is limited overall due to bowel gas and the patient's body habitus. Multiple tiny gallstones are suspected. There is mild prominence of the common bile duct without definite intraluminal stones. Increased hepatic echotexture compatible with fatty infiltrative change. Poor evaluation of the pancreas due to bowel gas and surrounding inflammatory changes. Electronically Signed   By: David  SwazilandJordan M.D.   On: 0Mar 30, 2019 14:02   [place] Renal  Result Date: 11/06/2017 CLINICAL DATA:  Acute kidney injury. EXAM: RENAL / URINARY TRACT ULTRASOUND COMPLETE COMPARISON:  None. FINDINGS: Right Kidney: Length: 12.8 cm. Echogenicity within normal limits. No mass or hydronephrosis visualized. Left Kidney: Length: 11.1 cm. Echogenicity within normal limits. No mass or hydronephrosis visualized. Bladder: Not visualize. Other: Small volume pelvic ascites. IMPRESSION: 1. Normal renal ultrasound. Electronically Signed   By: Elige KoHetal  Patel   On: 11/06/2017 15:15   Mr 3d Recon At Scanner  Result Date: 11/04/2017 CLINICAL DATA:  Pancreatitis with gallstones. EXAM: MRI ABDOMEN WITHOUT AND WITH CONTRAST (INCLUDING MRCP) TECHNIQUE: Multiplanar multisequence MR imaging of the abdomen was performed both before and after the administration of intravenous contrast. Heavily T2-weighted images of the biliary and pancreatic ducts were obtained, and three-dimensional MRCP images were rendered by post processing. CONTRAST:  20 cc MultiHance COMPARISON:  CT scan 11/03/2016 FINDINGS: Markedly motion degraded study. Lower chest: The heart is enlarged without substantial pericardial effusion. Hepatobiliary: Liver measures 20.8 cm and craniocaudal length, enlarged. Multiple gallstones are identified, measuring up to 7 mm diameter. Some stones or trapped behind a fold in the gallbladder fundus. MRCP images are markedly motion degraded and  nondiagnostic for tiny common duct stones. Axial T2 weighted single shot imaging shows common duct measuring 7 mm diameter. Common bile duct in the head of the pancreas also measures 7 mm diameter. The axial T2 weighted sequences reveal some central flow artifact in the common duct although a very tiny dependent signal void is seen in the common bile duct on axial image 34 series 5 that maps to a tiny filling defect in the common bile duct seen on coronal MRCP image 34 of series 6. This appearance is compatible with a tiny common bile duct  stone. Pancreas: . pancreas is diffusely edematous with peripancreatic edema tracking diffusely in the anterior pararenal space. Postcontrast imaging shows enhancement in the head of pancreas extreme pancreatic tail but most of the body and tail of pancreas do not enhance. No focal or rim enhancing fluid collection identified in the retroperitoneal space. Spleen:  No splenomegaly. No focal mass lesion. Adrenals/Urinary Tract: No adrenal nodule or mass. Limited assessment of the kidney shows no hydronephrosis or gross mass lesion. Stomach/Bowel: Stomach is nondistended. No gastric wall thickening. No evidence of outlet obstruction. Duodenum is normally positioned as is the ligament of Treitz. No small bowel or colonic dilatation within the visualized abdomen. Vascular/Lymphatic: Portal vein and superior mesenteric vein are patent. Splenic vein is patent. Celiac axis and SMA are patent. Small retroperitoneal / peripancreatic lymph nodes noted. Other: Free fluid is noted around the liver and spleen. Mesenteric edema/fluid is evident. Musculoskeletal: No abnormal marrow enhancement within the visualized bony anatomy. IMPRESSION: 1. Markedly motion degraded study. 2. Cholelithiasis with borderline distention of the extrahepatic bile ducts. 3 mm filling defect in the common duct as it enters the head of the pancreas is consistent with with choledocholithiasis. 3. Nonenhancement  involving much of the pancreatic body and tail parenchyma, highly suggestive of pancreatic necrosis. 4. Extensive edema/inflammation in the retroperitoneal space with intraperitoneal fluid and diffuse mesenteric edema in the abdomen. No focal/organized or rim enhancing fluid collection at this time to suggest evolving pseudocyst or abscess. 5. Hepatomegaly. 6. Portal vein is patent. Electronically Signed   By: Kennith Center M.D.   On: 11/04/2017 08:12   Dg Chest Port 1 View  Result Date: 11/11/2017 CLINICAL DATA:  Pancreatitis.  Shortness of breath. EXAM: PORTABLE CHEST 1 VIEW COMPARISON:  11/09/2017. FINDINGS: Interim extubation. Interim placement of feeding scratched it interim removal of NG tube placement of feeding tube. Right IJ line in stable position. Cardiomegaly. Low lung volumes. Left lower lobe infiltrate suggesting pneumonia. Asymmetric pulmonary edema could also present this fashion. Small left pleural effusion. No pneumothorax. IMPRESSION: 1. Interim extubation and removal of NG tube. Interim placement of feeding tube, its tip is below left hemidiaphragm. Right IJ line stable position. 2. Low lung volumes with basilar atelectasis. Left lower lobe infiltrate/edema and small left pleural effusion. 3.  Stable cardiomegaly. Electronically Signed   By: Maisie Fus  Register   On: 11/11/2017 06:57   Dg Chest Port 1 View  Result Date: 11/09/2017 CLINICAL DATA:  Hypoxia EXAM: PORTABLE CHEST 1 VIEW COMPARISON:  November 08, 2017 FINDINGS: Endotracheal tube tip is 3.2 cm above the carina. Nasogastric tube tip and side port are below the diaphragm. Central catheter tip is in the superior vena cava. No pneumothorax. There is persistent atelectatic change in the left base with small left pleural effusion. There is mild right base atelectasis. Heart is upper normal in size with pulmonary vascularity within normal limits. No adenopathy. No bone lesions. IMPRESSION: Tube and catheter positions as described without  pneumothorax. Persistent bibasilar atelectasis, more on the left than on the right, stable. No new opacity. Small left pleural effusion. Stable cardiac silhouette. Electronically Signed   By: Bretta Bang III M.D.   On: 11/09/2017 07:16   Dg Chest Port 1 View  Result Date: 11/08/2017 CLINICAL DATA:  Hypoxia EXAM: PORTABLE CHEST 1 VIEW COMPARISON:  November 07, 2017 FINDINGS: Endotracheal tube tip is 3.7 cm above the carina. Nasogastric tube tip and side port are below the diaphragm. Central catheter tip is in the superior vena cava. No pneumothorax.  There is atelectatic change in both lung bases, more on the left than on the right, stable. There is a minimal pleural effusion on each side. Heart is mildly enlarged with pulmonary vascularity within normal limits. No adenopathy. No bone lesions. IMPRESSION: Tube and catheter positions as described without evident pneumothorax. Bibasilar atelectasis, more on the left than on the right, with small bilateral pleural effusions. Stable cardiac prominence. Electronically Signed   By: Bretta Bang III M.D.   On: 11/08/2017 07:13   Dg Chest Port 1 View  Result Date: 11/07/2017 CLINICAL DATA:  Central line placement. EXAM: PORTABLE CHEST 1 VIEW COMPARISON:  0240 hours FINDINGS: Interval placement of right jugular non tunneled hemodialysis catheter with the catheter tip at the level of the mid SVC. No pneumothorax. Endotracheal tube remains present with the tip approximately 3.5 cm above the carina. Gastric decompression tube extends into the stomach. Lungs show improved aeration bilaterally and decrease in pulmonary edema. Probable component of left pleural fluid. IMPRESSION: Non tunneled dialysis catheter tip in SVC. No pneumothorax identified after placement. Lungs show improved aeration with decrease in pulmonary edema. Electronically Signed   By: Irish Lack M.D.   On: 11/07/2017 15:22   Dg Chest Port 1 View  Result Date: 11/07/2017 CLINICAL DATA:   57 y/o  F; post intubation, ET tube position. EXAM: PORTABLE CHEST 1 VIEW COMPARISON:  11/06/2017 chest radiograph FINDINGS: Low lung volumes. Stable enlarged cardiac silhouette given projection and technique. Increased diffuse hazy airspace opacities and small effusions probably representing pulmonary edema. Endotracheal tube is 1.4 cm from carina. No acute osseous abnormality identified. IMPRESSION: Endotracheal tube 1.4 cm from carina. Increased hazy lung opacities compatible with pulmonary edema and small effusions bilaterally. Electronically Signed   By: Mitzi Hansen M.D.   On: 11/07/2017 04:24   Dg Chest Port 1 View  Result Date: 11/06/2017 CLINICAL DATA:  Acute respiratory failure EXAM: PORTABLE CHEST 1 VIEW COMPARISON:  11/04/2017 FINDINGS: Hypoventilation with decreased lung volume. Increase in bibasilar atelectasis left greater than right. Probable left pleural effusion. Pulmonary vascular congestion suggesting mild heart failure. IMPRESSION: Hypoventilation with increased atelectasis in lung bases left greater than right. Small left effusion. Progression of pulmonary vascular congestion suggesting mild fluid overload. Electronically Signed   By: Marlan Palau M.D.   On: 11/06/2017 09:05   Dg Chest Port 1 View  Result Date: 11/04/2017 CLINICAL DATA:  Shortness of breath, weakness, and fever. EXAM: PORTABLE CHEST 1 VIEW COMPARISON:  Report from prior chest radiograph 10/19/2013 FINDINGS: The patient is rotated to the right on today's radiograph, reducing diagnostic sensitivity and specificity. Mild enlargement of the cardiopericardial silhouette. Prominence of mediastinal contour is probably from fatty mediastinal tissues based on comparison to the recent abdomen MRI. Low lung volumes are present, causing crowding of the pulmonary vasculature. Indistinct airspace opacity at the left lung base compatible with atelectasis or pneumonia. Similar indistinct density at the right lung base  medially. Rightward tracheal deviation, partly rotation related IMPRESSION: 1. Left greater than right airspace opacities in the lung bases favoring atelectasis or pneumonia. 2. Low lung volumes are present, causing crowding of the pulmonary vasculature. 3. Mild enlargement of the cardiopericardial silhouette. Electronically Signed   By: Gaylyn Rong M.D.   On: 11/04/2017 14:27   Dg Chest Port 1v Same Day  Result Date: 11/19/2017 CLINICAL DATA:  Increase shortness of breath EXAM: PORTABLE CHEST 1 VIEW COMPARISON:  11/11/2017 FINDINGS: 0851 hours. Low lung volumes. The cardio pericardial silhouette is enlarged. There is pulmonary  vascular congestion without overt pulmonary edema. Persistent left base collapse/consolidation, similar. Right PICC line tip overlies the distal SVC. The visualized bony structures of the thorax are intact. IMPRESSION: No substantial interval change. Cardiomegaly with vascular congestion and left base collapse/consolidation. Electronically Signed   By: Kennith Center M.D.   On: 11/19/2017 10:02   Dg Abd Decub  Result Date: 11/19/2017 CLINICAL DATA:  Ileus, abdominal distention. EXAM: ABDOMEN - 1 VIEW DECUBITUS COMPARISON:  11/13/2017 and CT abdomen pelvis 11/11/2017. FINDINGS: Two left lateral decubitus views of the abdomen show air-fluid levels in dilated bowel. No definite free air. Abdomen is incompletely imaged. IMPRESSION: Gaseous distention of bowel with air-fluid levels is compatible with the given history of an ileus. Abdomen is incompletely imaged. Electronically Signed   By: Leanna Battles M.D.   On: 11/19/2017 12:02   Dg Abd Portable 1v  Result Date: 11/13/2017 CLINICAL DATA:  NG tube placement. EXAM: PORTABLE ABDOMEN - 1 VIEW COMPARISON:  No recent prior. FINDINGS: NG tube noted with tip coiled stomach. No bowel distention noted. Bibasilar atelectasis. No acute bony abnormality noted. IMPRESSION: NG tube noted with tip coiled in the stomach. Electronically  Signed   By: Maisie Fus  Register   On: 11/13/2017 10:07   Dg Abd Portable 1v  Result Date: 11/10/2017 CLINICAL DATA:  Status post feeding tube placement. EXAM: PORTABLE ABDOMEN - 1 VIEW COMPARISON:  Abdominal radiograph of 07 November 2017 FINDINGS: The nasogastric tube has been removed and replaced with a radiodense tipped feeding tube. Thetube tip is in the mid gastric body. IMPRESSION: The tip of the feeding tube is in the mid gastric body. Electronically Signed   By: David  [place] M.D.   On: 11/10/2017 11:28   Mr Abdomen Mrcp Vivien Rossetti Contast  Result Date: 11/04/2017 CLINICAL DATA:  Pancreatitis with gallstones. EXAM: MRI ABDOMEN WITHOUT AND WITH CONTRAST (INCLUDING MRCP) TECHNIQUE: Multiplanar multisequence MR imaging of the abdomen was performed both before and after the administration of intravenous contrast. Heavily T2-weighted images of the biliary and pancreatic ducts were obtained, and three-dimensional MRCP images were rendered by post processing. CONTRAST:  20 cc MultiHance COMPARISON:  CT scan 11/03/2016 FINDINGS: Markedly motion degraded study. Lower chest: The heart is enlarged without substantial pericardial effusion. Hepatobiliary: Liver measures 20.8 cm and craniocaudal length, enlarged. Multiple gallstones are identified, measuring up to 7 mm diameter. Some stones or trapped behind a fold in the gallbladder fundus. MRCP images are markedly motion degraded and nondiagnostic for tiny common duct stones. Axial T2 weighted single shot imaging shows common duct measuring 7 mm diameter. Common bile duct in the head of the pancreas also measures 7 mm diameter. The axial T2 weighted sequences reveal some central flow artifact in the common duct although a very tiny dependent signal void is seen in the common bile duct on axial image 34 series 5 that maps to a tiny filling defect in the common bile duct seen on coronal MRCP image 34 of series 6. This appearance is compatible with a tiny common bile duct  stone. Pancreas: . pancreas is diffusely edematous with peripancreatic edema tracking diffusely in the anterior pararenal space. Postcontrast imaging shows enhancement in the head of pancreas extreme pancreatic tail but most of the body and tail of pancreas do not enhance. No focal or rim enhancing fluid collection identified in the retroperitoneal space. Spleen:  No splenomegaly. No focal mass lesion. Adrenals/Urinary Tract: No adrenal nodule or mass. Limited assessment of the kidney shows no hydronephrosis or gross mass  lesion. Stomach/Bowel: Stomach is nondistended. No gastric wall thickening. No evidence of outlet obstruction. Duodenum is normally positioned as is the ligament of Treitz. No small bowel or colonic dilatation within the visualized abdomen. Vascular/Lymphatic: Portal vein and superior mesenteric vein are patent. Splenic vein is patent. Celiac axis and SMA are patent. Small retroperitoneal / peripancreatic lymph nodes noted. Other: Free fluid is noted around the liver and spleen. Mesenteric edema/fluid is evident. Musculoskeletal: No abnormal marrow enhancement within the visualized bony anatomy. IMPRESSION: 1. Markedly motion degraded study. 2. Cholelithiasis with borderline distention of the extrahepatic bile ducts. 3 mm filling defect in the common duct as it enters the head of the pancreas is consistent with with choledocholithiasis. 3. Nonenhancement involving much of the pancreatic body and tail parenchyma, highly suggestive of pancreatic necrosis. 4. Extensive edema/inflammation in the retroperitoneal space with intraperitoneal fluid and diffuse mesenteric edema in the abdomen. No focal/organized or rim enhancing fluid collection at this time to suggest evolving pseudocyst or abscess. 5. Hepatomegaly. 6. Portal vein is patent. Electronically Signed   By: Kennith Center M.D.   On: 11/04/2017 08:12   Ct Renal Stone Study  Result Date: 10/31/2017 CLINICAL DATA:  Left-sided flank pain and  hematuria.  Vomiting. EXAM: CT ABDOMEN AND PELVIS WITHOUT CONTRAST TECHNIQUE: Multidetector CT imaging of the abdomen and pelvis was performed following the standard protocol without oral or intravenous contrast material administration. COMPARISON:  CT abdomen and pelvis April 09, 2008 FINDINGS: Lower chest: There is bibasilar atelectatic change. Hepatobiliary: There is hepatic steatosis. No focal liver lesions are evident on this noncontrast enhanced study. Gallbladder wall is not appreciably thickened. There is no biliary duct dilatation. Pancreas: There is edema throughout the pancreas with soft tissue stranding in the adjacent peripancreatic mesentery throughout the peripancreatic region. Fluid tracks to the right of the pancreatic head and inferior to the uncinate process as well as anterior to the pancreatic head. Fluid tracks from the body and tail the pancreas anteriorly to reach the greater curvature of the stomach in the midportion of the gastric body region. A well-defined pseudocyst type lesion is not seen. There is no pancreatic duct dilatation. Spleen: No splenic lesions are evident. Adrenals/Urinary Tract: Adrenals bilaterally appear normal. Kidneys bilaterally show no evident mass or hydronephrosis. There is a 1 mm calculus in the upper pole left kidney, nonobstructing. There is no evident ureteral calculus on either side. Urinary bladder is midline with wall thickness within normal limits. Stomach/Bowel: There is mild wall thickening along the greater curvature of the stomach in the midbody region in along the second and third duodenum due to nearby pancreatitis. No other bowel wall thickening is evident. There is no appreciable bowel obstruction. No evident free air or portal venous air. Vascular/Lymphatic: There is no abdominal aortic aneurysm. No vascular lesions are appreciable on this study. No adenopathy is evident in the abdomen or pelvis. Reproductive: Uterus is anteverted. There is an  apparent leiomyoma extending from the leftward aspect of the uterus anteriorly measuring 4.3 x 4.0 cm. No extrauterine pelvic mass evident. Other: Appendix appears normal. No abscess is seen in the abdomen or pelvis. No free pelvic fluid seen. Loculated fluid adjacent to the pancreas is noted. Musculoskeletal: There is degenerative change in the lumbar spine. There are no blastic or lytic bone lesions. There is no intramuscular or abdominal wall lesion evident. IMPRESSION: 1. Acute pancreatitis with pancreatic edema and peripancreatic fluid, most notably anterior to the body and tail of the pancreas. No well-defined pseudocyst or  noncystic mass. No pancreatic duct dilatation. 2. Areas of gastritis and duodenitis due to adjacent pancreatitis causing inflammation in these areas. No bowel obstruction. No abscess. Appendix appears normal. 3. 1 mm nonobstructing calculus upper pole left kidney. No hydronephrosis or ureteral calculus on either side. 4.  Hepatic steatosis. 5. 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 Electronically Signed   By: Bretta Bang III M.D.   On: 12-02-17 12:04     LOS: 17 days   Jeoffrey Massed, MD  [facility] Pager:[phone]  If 7PM-7AM, please contact night-coverage [email or web address] Password [facility] 11/20/2017, 11:35 AM

## 2017-11-20 NOTE — Progress Notes (Signed)
CC: pancreatitis   Subjective: She was on BiPAP last pm and is doing better now.  No complaints of abdominal pain.    Objective: Vital signs in last 24 hours: Temp:  [97.7 F (36.5 C)-99.4 F (37.4 C)] 98.1 F (36.7 C) (01/31 0359) Pulse Rate:  [90-109] 97 (01/31 0400) Resp:  [18-28] 25 (01/31 0400) BP: (130-151)/(53-86) 151/61 (01/31 0400) SpO2:  [94 %-99 %] 96 % (01/31 0400) FiO2 (%):  [40 %] 40 % (01/30 1837) Weight:  [135.9 kg (299 lb 9.7 oz)-138.2 kg (304 lb 10.8 oz)] 138.2 kg (304 lb 10.8 oz) (01/31 0248) Last BM Date: 11/19/17 360 Po 1098 IV 2400 urine Stool x 1 Afebrile, RR still in the 20-30 range; Tachycardic  Rate 90's - 110 range Creatinine is up to 1.97 Glucose up some ALT/AST are better No lipase Last WBC 18.7 on 11/19/17  Intake/Output from previous day: 01/30 0701 - 01/31 0700 In: 1458.2 [P.O.:360; I.V.:1098.2] Out: 2400 [Urine:2400] Intake/Output this shift: No intake/output data recorded.  General appearance: alert, cooperative and no distress GI: soft, non-tender; bowel sounds normal; no masses,  no organomegaly  Lab Results:  Recent Labs    11/19/17 0402  WBC 18.7*  HGB 8.1*  HCT 25.5*  PLT 470*    BMET Recent Labs    11/19/17 0402 11/20/17 0448  NA 143 140  K 3.7 3.9  CL 114* 114*  CO2 22 20*  GLUCOSE 162* 180*  BUN 50* 59*  CREATININE 1.60* 1.97*  CALCIUM 7.7* 7.9*   PT/INR No results for input(s): LABPROT, INR in the last 72 hours.  Recent Labs  Lab 11/14/17 0626 11/15/17 0249 11/16/17 0444 11/17/17 0452 11/19/17 0402 11/20/17 0448  AST 57*  --   --  92* 83* 38  ALT 50  --   --  72* 89* 58*  ALKPHOS 115  --   --  129* 182* 160*  BILITOT 0.6  --   --  0.5 0.6 0.4  PROT 5.4*  --   --  5.8* 5.8* 5.9*  ALBUMIN 1.9*  1.8* 1.9* 1.9* 1.9* 1.8* 1.9*     Lipase     Component Value Date/Time   LIPASE 54 (H) 11/13/2017 1110   Prior to Admission medications   Medication Sig Start Date End Date Taking?  Authorizing Provider  ibuprofen (ADVIL,MOTRIN) 200 MG tablet Take 1,000 mg by mouth once as needed. For pain   Yes [provider]  lisinopril-hydrochlorothiazide (PRINZIDE,ZESTORETIC) 20-25 MG tablet Take 1 tablet by mouth daily as needed (hypertension).    Yes [provider]  promethazine (PHENERGAN) 25 MG tablet Take 25 mg by mouth every 6 (six) hours as needed for nausea or vomiting.   Yes [provider]      Medications: . chlorhexidine  15 mL Mouth Rinse BID  . Chlorhexidine Gluconate Cloth  6 each Topical Daily  . fentaNYL  75 mcg Transdermal Q72H  . insulin aspart  0-15 Units Subcutaneous Q4H  . mouth rinse  15 mL Mouth Rinse q12n4p  . pantoprazole (PROTONIX) IV  40 mg Intravenous Q12H  . sodium chloride flush  10-40 mL Intracatheter Q12H   . sodium chloride 10 mL/hr at 11/20/17 0400  . TPN (CLINIMIX) Adult without lytes 60 mL/hr at 11/20/17 0400   Anti-infectives (From admission, onward)   Start     Dose/Rate Route Frequency Ordered Stop   11/12/17 1800  meropenem (MERREM) 1 g in sodium chloride 0.9 % 100 mL IVPB  Status:  Discontinued     1 g 200 mL/hr over 30 Minutes Intravenous Every 8 hours 11/12/17 1115 11/17/17 0954   11/11/17 1000  meropenem (MERREM) 1 g in sodium chloride 0.9 % 100 mL IVPB  Status:  Discontinued     1 g 200 mL/hr over 30 Minutes Intravenous Every 12 hours 11/11/17 0911 11/12/17 1115   11/09/17 1000  piperacillin-tazobactam (ZOSYN) IVPB 3.375 g  Status:  Discontinued     3.375 g 12.5 mL/hr over 240 Minutes Intravenous Every 8 hours 11/09/17 0614 11/09/17 0615   11/09/17 1000  piperacillin-tazobactam (ZOSYN) IVPB 3.375 g  Status:  Discontinued     3.375 g 100 mL/hr over 30 Minutes Intravenous Every 6 hours 11/09/17 0616 11/11/17 0911   11/07/17 2200  piperacillin-tazobactam (ZOSYN) IVPB 2.25 g  Status:  Discontinued     2.25 g 100 mL/hr over 30 Minutes Intravenous Every 8 hours 11/07/17 1218 11/07/17 1741   11/07/17 2200   piperacillin-tazobactam (ZOSYN) IVPB 3.375 g  Status:  Discontinued     3.375 g 12.5 mL/hr over 240 Minutes Intravenous Every 6 hours 11/07/17 1741 11/07/17 1747   11/07/17 2200  piperacillin-tazobactam (ZOSYN) IVPB 3.375 g  Status:  Discontinued     3.375 g 100 mL/hr over 30 Minutes Intravenous Every 6 hours 11/07/17 1747 11/09/17 0613   11/04/17 1100  piperacillin-tazobactam (ZOSYN) IVPB 3.375 g  Status:  Discontinued     3.375 g 12.5 mL/hr over 240 Minutes Intravenous Every 8 hours 11/04/17 1059 11/07/17 1218   11/11/2017 1200  piperacillin-tazobactam (ZOSYN) IVPB 3.375 g     3.375 g 100 mL/hr over 30 Minutes Intravenous  Once 10/21/2017 1157 11/15/2017 1309      Assessment/Plan 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       LOS: 17 days    Emma Washington 11/20/2017 [id number]

## 2017-11-21 DIAGNOSIS — E8770 Fluid overload, unspecified: Secondary | ICD-10-CM

## 2017-11-21 DIAGNOSIS — R6511 Systemic inflammatory response syndrome (SIRS) of non-infectious origin with acute organ dysfunction: Secondary | ICD-10-CM

## 2017-11-21 DIAGNOSIS — R109 Unspecified abdominal pain: Secondary | ICD-10-CM

## 2017-11-21 DIAGNOSIS — K567 Ileus, unspecified: Secondary | ICD-10-CM

## 2017-11-21 LAB — GLUCOSE, CAPILLARY
GLUCOSE-CAPILLARY: 150 mg/dL — AB (ref 65–99)
GLUCOSE-CAPILLARY: 137 mg/dL — AB (ref 65–99)
GLUCOSE-CAPILLARY: 134 mg/dL — AB (ref 65–99)
Glucose-Capillary: 133 mg/dL — ABNORMAL HIGH (ref 65–99)
Glucose-Capillary: 129 mg/dL — ABNORMAL HIGH (ref 65–99)
Glucose-Capillary: 162 mg/dL — ABNORMAL HIGH (ref 65–99)

## 2017-11-21 LAB — BASIC METABOLIC PANEL
ANION GAP: 8 (ref 5–15)
BUN: 61 mg/dL — AB (ref 6–20)
CALCIUM: 8 mg/dL — AB (ref 8.9–10.3)
CO2: 21 mmol/L — ABNORMAL LOW (ref 22–32)
Chloride: 110 mmol/L (ref 101–111)
Creatinine, Ser: 1.82 mg/dL — ABNORMAL HIGH (ref 0.44–1.00)
GFR calc Af Amer: 35 mL/min — ABNORMAL LOW (ref >60)
GFR calc non Af Amer: 30 mL/min — ABNORMAL LOW (ref >60)
GLUCOSE: 146 mg/dL — AB (ref 65–99)
Potassium: 3.7 mmol/L (ref 3.5–5.1)
SODIUM: 139 mmol/L (ref 135–145)

## 2017-11-21 LAB — MAGNESIUM: Magnesium: 1.6 mg/dL — ABNORMAL LOW (ref 1.7–2.4)

## 2017-11-21 LAB — PHOSPHORUS: Phosphorus: 4.5 mg/dL (ref 2.5–4.6)

## 2017-11-21 MED ORDER — TRACE MINERALS CR-CU-MN-SE-ZN 10-1000-500-60 MCG/ML IV SOLN
INTRAVENOUS | Status: AC
  Administered 2017-11-21: 17:00:00 via INTRAVENOUS
  Filled 2017-11-21 (×2): qty 1440

## 2017-11-21 MED ORDER — ONDANSETRON HCL 4 MG/2ML IJ SOLN: 4.0000 mg | Freq: Four times a day (QID) | INTRAMUSCULAR | Status: DC | PRN

## 2017-11-21 MED ORDER — DIPHENHYDRAMINE HCL 12.5 MG/5ML PO ELIX: 12.5000 mg | ORAL_SOLUTION | Freq: Four times a day (QID) | ORAL | Status: DC | PRN

## 2017-11-21 MED ORDER — ALPRAZOLAM 0.25 MG PO TABS
0.2500 mg | ORAL_TABLET | Freq: Three times a day (TID) | ORAL | Status: DC | PRN
  Administered 2017-11-25 (×2): 0.25 mg via ORAL
  Filled 2017-11-21 (×2): qty 1

## 2017-11-21 MED ORDER — NALOXONE HCL 0.4 MG/ML IJ SOLN: 0.4000 mg | INTRAMUSCULAR | Status: DC | PRN

## 2017-11-21 MED ORDER — DIPHENHYDRAMINE HCL 50 MG/ML IJ SOLN
12.5000 mg | Freq: Four times a day (QID) | INTRAMUSCULAR | Status: DC | PRN
  Administered 2017-11-24: 12.5 mg via INTRAVENOUS
  Filled 2017-11-21: qty 1

## 2017-11-21 MED ORDER — FENTANYL 40 MCG/ML IV SOLN
INTRAVENOUS | Status: DC
  Administered 2017-11-21: 1000 ug via INTRAVENOUS
  Administered 2017-11-21 (×2): 90 ug via INTRAVENOUS
  Administered 2017-11-22: 10:00:00 via INTRAVENOUS
  Administered 2017-11-22: 150 ug via INTRAVENOUS
  Administered 2017-11-22: 135 ug via INTRAVENOUS
  Administered 2017-11-22 – 2017-11-23 (×2): 150 ug via INTRAVENOUS
  Administered 2017-11-23: 330 ug via INTRAVENOUS
  Administered 2017-11-23: 180 ug via INTRAVENOUS
  Administered 2017-11-23: 150 ug via INTRAVENOUS
  Administered 2017-11-23: 135 ug via INTRAVENOUS
  Administered 2017-11-24: 180 ug via INTRAVENOUS
  Administered 2017-11-24: 15 ug via INTRAVENOUS
  Administered 2017-11-24: 195 ug via INTRAVENOUS
  Administered 2017-11-24: 255 ug via INTRAVENOUS
  Administered 2017-11-24: 09:00:00 via INTRAVENOUS
  Administered 2017-11-24: 210 ug via INTRAVENOUS
  Administered 2017-11-25: 30 ug via INTRAVENOUS
  Administered 2017-11-25: 105 ug via INTRAVENOUS
  Administered 2017-11-25: 150 ug via INTRAVENOUS
  Administered 2017-11-25: 105 ug via INTRAVENOUS
  Administered 2017-11-25: 40 ug via INTRAVENOUS
  Filled 2017-11-21 (×5): qty 25

## 2017-11-21 MED ORDER — SODIUM CHLORIDE 0.9% FLUSH: 9.0000 mL | INTRAVENOUS | Status: DC | PRN

## 2017-11-21 MED ORDER — MAGNESIUM SULFATE 2 GM/50ML IV SOLN
2.0000 g | Freq: Once | INTRAVENOUS | Status: AC
  Administered 2017-11-21: 2 g via INTRAVENOUS
  Filled 2017-11-21: qty 50

## 2017-11-21 NOTE — Progress Notes (Signed)
PHARMACY - ADULT TOTAL PARENTERAL NUTRITION CONSULT NOTE   Pharmacy Consult for TPN Indication: severe pancreatitis  Patient Measurements: Height: _0  (177.8 cm) Weight: (!) 306 lb 10.6 oz (139.1 kg) IBW/kg (Calculated) : 68.5 TPN AdjBW (KG): 85.3 Body mass index is 44 kg/m.  Insulin Requirements: 14 units Novolog (moderate scale) in previous 24hrs CL diet likely increased CBGs  Current Nutrition: back to CL diet 1/30, TPN not yet at goal Boost Breeze BID, each supplement provides 250 kcal and 9 grams of protein.  IVF: 1/2 NS at [facility], asked RN if this can be saline locked   Central access: R IJ HD catheter TPN start date: 1/24  ASSESSMENT                                                                                                          HPI: 57 y.o. Female with gallstone pancreatitis with necrosis s/p MRCP on 1/15.  Repeat LFTs and lipase normalizing.  No plans for surgical intervention at this time; planning for delayed cholecystectomy per [facility].  Tube feeding was started but had to be stopped on 1/22 due to nausea/vomiting.  Pharmacy is consulted to begin TPN. Patient's course has been complicated with respiratory failure and intubation and subsequent extubation on 11/09/2017.  She was also on CRRT from 1/18-1/23.  Significant events:  1/28: CL diet 1/29: FL diet 1/30: did not tolerate FLD, back to CLD, weight incr 305 > 314   Today: 11/21/2017  Glucose: No hx DM. CBGs elevated since starting TPN but improved slightly with addition on insulin to TPN (CBG 133-189).   Electrolytes: Phos decreased to 4.5, withOUT electrolytes in TPN.  CorrCa 9.68 for Phos-Calcium product of 43.5. Mag low at 1.6, Na and K are WNL.  Renal: AKI - CRRT 1/18-1/23. SCr remains elevated off CRRT, UOP decreased yesterday, compared with previous day likely d/t lasix given 1/30 (I/O net -812).    LFTs: AST/ALT elevated but improving, Alk phos elevated, Tbili WNL  TGs: 213 (1/15), 332 (1/18),  improving 276 (1/28)  Prealbumin: 6.7 (1/25), 9.3 (1/28)  NUTRITIONAL GOALS                                                                                           RD recs 1/31: 2270-2405 kcal, >/= 135 grams protein per day  Clinimix 5/15 at a goal rate of 118m/hr will provide 132g/day protein, 1874Kcal/day. After 7 days, the addition of 20% fat emulsion at 223mhr to provide 132g/day protein, 2354Kcal/day.  NOTE Patient may not be able to tolerate this amount of fluid given current renal function.  PLAN  Now: Mag 2g IV bolus  At 1800 today:  Keep Clinimix 5/15 (no electrolytes) at 29m/hr today per discussion with MD. Patient is volume overloaded with dyspnea, tachypnea.  Will hold off advancing TPN to avoid extra fluid in current status despite high protein needs, critical for wound healing.  Held 20% lipid emulsion for first 7 days for ICU patients per ASPEN guidelines. Able to resume but will hold off for now given elevated triglycerides, pancreatitis, and trying to avoid providing extra volume.  TPN to contain standard multivitamins and trace elements  Continue insulin in TPN, 50 units regular insulin per 24 hr bag  Continue SSI q4h MODERATE scale  TPN lab panels on Mondays & Thursdays. BMET, Mag, Phos in AM to trend electrolytes without lytes in TPN.  F/u daily.   CGretta ArabPharmD, BCPS Pager [phone]/10/2017 8:25 AM

## 2017-11-21 NOTE — Progress Notes (Signed)
Patient ID: Emma Washington, female   DOB: [birth year], 57 y.o.   MRN: [id number]    Progress Note   Subjective   Feeling significantly better since on PCA and pain controlled  Had nausea last night, no vomiting, better today  Had BM last night  Says her breathing has improved TPN infusing    Objective   Vital signs in last 24 hours: Temp:  [98.3 F (36.8 C)-99.6 F (37.6 C)] 99.6 F (37.6 C) (02/01 0800) Pulse Rate:  [93-110] 95 (02/01 0800) Resp:  [20-37] 35 (02/01 0846) BP: (108-154)/(60-82) 135/60 (02/01 0800) SpO2:  [94 %-99 %] 98 % (02/01 0846) FiO2 (%):  [36 %] 36 % (01/31 2324) Weight:  [306 lb 10.6 oz (139.1 kg)] 306 lb 10.6 oz (139.1 kg) (02/01 0600) Last BM Date: 11/19/17 General:  older  white female in NAD, more comfortable Heart:  Regular rate and rhythm; no murmurs Lungs: Respirations even decreased bilat bases Abdomen:  Obese ,distended , less tender diffusely, BS heard, soft tissue edema  Upper abdominal wall Extremities:  Without edema. Neurologic:  Alert and oriented,  grossly normal neurologically. Psych:  Cooperative. Normal mood and affect.  Intake/Output from previous day: 01/31 0701 - 02/01 0700 In: 938 [I.V.:938] Out: 1750 [Urine:1750] Intake/Output this shift: Total I/O In: 260 [P.O.:120; I.V.:140] Out: -   Lab Results: Recent Labs    11/19/17 0402  WBC 18.7*  HGB 8.1*  HCT 25.5*  PLT 470*   BMET Recent Labs    11/19/17 0402 11/20/17 0448 11/21/17 0554  NA 143 140 139  K 3.7 3.9 3.7  CL 114* 114* 110  CO2 22 20* 21*  GLUCOSE 162* 180* 146*  BUN 50* 59* 61*  CREATININE 1.60* 1.97* 1.82*  CALCIUM 7.7* 7.9* 8.0*   LFT Recent Labs    11/20/17 0448  PROT 5.9*  ALBUMIN 1.9*  AST 38  ALT 58*  ALKPHOS 160*  BILITOT 0.4   PT/INR No results for input(s): LABPROT, INR in the last 72 hours.  Studies/Results: Dg Abd Decub  Result Date: 11/19/2017 CLINICAL DATA:  Ileus, abdominal distention. EXAM: ABDOMEN - 1 VIEW DECUBITUS  COMPARISON:  11/13/2017 and CT abdomen pelvis 11/11/2017. FINDINGS: Two left lateral decubitus views of the abdomen show air-fluid levels in dilated bowel. No definite free air. Abdomen is incompletely imaged. IMPRESSION: Gaseous distention of bowel with air-fluid levels is compatible with the given history of an ileus. Abdomen is incompletely imaged. Electronically Signed   By: Leanna BattlesMelinda  Blietz M.D.   On: 11/19/2017 12:02       Assessment / Plan:     #1 57 yo female ,day # 18 hospital  With severe  Necrotizing Gallstone pancreatitis  With SIRs, required vent support earlier in admit  Transferred bcak to ICU 1/30 with decline in respiratory status secondary to volume overload- improved Ongoing abdominal pain and ileus- more comfortable with PCA ON TPN for nutritional support   Off antibiotics No plans for ERCP Plan is for follow up CT next week   #2 AKI  #3 anemia - secondary to acute illness #4 morbid obesity   GI will follow up on Monday , please call over weekend for any acute issues.        Contact  Amy Esterwood, P.A.-C               [phone]    ________________________________________________________________________  [facility] MD note:  I personally examined the patient, reviewed the data and agree with the assessment  and plan described above.  Continues to slowly recover from severe acute pancreatitis.  [facility] will return to see her on Monday but I am available all weekend for any questions or concerns.   Rob Bunting, MD [facility] Pager [phone]

## 2017-11-21 NOTE — Progress Notes (Signed)
Pt very anxious throughout night.  C/o right lower quadrant abdominal pain.  Asking for pain meds every hour.

## 2017-11-21 NOTE — Progress Notes (Signed)
PROGRESS NOTE        PATIENT DETAILS Name: Emma Washington Age: 57 y.o. Sex: female Date of Birth: Sep 13, 1961 Admit Date: 10/22/2017 Admitting Physician Albertine Grates, MD PCP:No primary care provider on file.  Brief Narrative: Patient is a 57 y.o. 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  She briefly required CRRT, subsequently extubated and transferred to try to hospitalist service.  She slowly improving-but continues to havee abdominal pain, massive volume overload, and shortness of breath.  She was transferred to stepdown unit on 1/30 respiratory distress felt to be due to volume overload-given diuretics and briefly started on BiPAP.  See below for further details  Subjective: Claims he had a rough night due to abdominal pain.  Nausea this morning.  BM yesterday.   Assessment/Plan: Severe necrotizing gallstone pancreatitis: Although overall improved-she is still continues to have significant abdominal pain-on clear liquids-unable to advance diet any further.  Given severe pain in spite of transdermal fentanyl and as needed IV fentanyl for breakthrough-we will go ahead and start her on PCA fentanyl. Appreciate general surgery and GI input-we will let creatinine down trend further to see if we can do a CT scan of the abdomen with IV contrast over the next few days.  Continue supportive care.  Acute kidney injury: Suspect this is hemodynamically mediated in the setting of severe pancreatitis-briefly required CRRT during this hospital stay.  She still has some amount of lower extremity edema-suspect due to hypoalbuminemia-given Lasix a few days back-we will allow the creatinine down trended further-she is not in any respiratory distress-hold diuretics for now.  Continue to follow weights/intake output.  Acute  hypoxic respiratory failure: Multifactorial-secondary to volume overload/anxiety and pain-no longer requiring BiPAP-and stable on nasal cannula.  I suspect she could have underlying OSA/OHS contributing as well.  On 1/30 she did develop worsening respiratory distress due to volume overload requiring BiPAP support and IV Lasix.  She currently is much improved and on nasal cannula.  Anemia of critical illness: Hemoglobin currently stable-no indication for transfusion at this time  Volume overload: Secondary to acute illness/IV fluid resuscitation and hypoalbuminemia-holding diuretics for now-she does have some lower extremity edema-will reassess over the next few days regarding plans for further diuresis.  Morbid obesity  Suspected OHS/OSA: Outpatient sleep study-BiPAP nightly if she tolerates.  Other-reinsert Foley catheter-she is still acutely ill-and is in significant pain and is unable to use bedside commode.  Patient is a [employer] and is aware of infection risk.   DVT Prophylaxis: Prophylactic Lovenox   Code Status: Full code  Family Communication: None at bedside  Disposition Plan: Remain inpatient-remain in stepdown .  Antimicrobial agents: Anti-infectives (From admission, onward)   Start     Dose/Rate Route Frequency Ordered Stop   11/12/17 1800  meropenem (MERREM) 1 g in sodium chloride 0.9 % 100 mL IVPB  Status:  Discontinued     1 g 200 mL/hr over 30 Minutes Intravenous Every 8 hours 11/12/17 1115 11/17/17 0954   11/11/17 1000  meropenem (MERREM) 1 g in sodium chloride 0.9 % 100 mL IVPB  Status:  Discontinued     1 g 200 mL/hr over 30 Minutes Intravenous Every 12 hours 11/11/17 0911 11/12/17 1115   11/09/17 1000  piperacillin-tazobactam (ZOSYN) IVPB 3.375  g  Status:  Discontinued     3.375 g 12.5 mL/hr over 240 Minutes Intravenous Every 8 hours 11/09/17 0614 11/09/17 0615   11/09/17 1000  piperacillin-tazobactam (ZOSYN) IVPB 3.375 g  Status:  Discontinued     3.375 g 100  mL/hr over 30 Minutes Intravenous Every 6 hours 11/09/17 0616 11/11/17 0911   11/07/17 2200  piperacillin-tazobactam (ZOSYN) IVPB 2.25 g  Status:  Discontinued     2.25 g 100 mL/hr over 30 Minutes Intravenous Every 8 hours 11/07/17 1218 11/07/17 1741   11/07/17 2200  piperacillin-tazobactam (ZOSYN) IVPB 3.375 g  Status:  Discontinued     3.375 g 12.5 mL/hr over 240 Minutes Intravenous Every 6 hours 11/07/17 1741 11/07/17 1747   11/07/17 2200  piperacillin-tazobactam (ZOSYN) IVPB 3.375 g  Status:  Discontinued     3.375 g 100 mL/hr over 30 Minutes Intravenous Every 6 hours 11/07/17 1747 11/09/17 0613   11/04/17 1100  piperacillin-tazobactam (ZOSYN) IVPB 3.375 g  Status:  Discontinued     3.375 g 12.5 mL/hr over 240 Minutes Intravenous Every 8 hours 11/04/17 1059 11/07/17 1218   12-03-17 1200  piperacillin-tazobactam (ZOSYN) IVPB 3.375 g     3.375 g 100 mL/hr over 30 Minutes Intravenous  Once 12/03/2017 1157 Dec 03, 2017 1309      Procedures: ETT 1/18 >> 1/20  CONSULTS:  pulmonary/intensive care, GI and general surgery  Time spent: 35 minutes -Greater than 50% of this time was spent in counseling, explanation of diagnosis, planning of further management, and coordination of care.  MEDICATIONS: Scheduled Meds: . chlorhexidine  15 mL Mouth Rinse BID  . Chlorhexidine Gluconate Cloth  6 each Topical Daily  . feeding supplement  1 Container Oral BID BM  . fentaNYL   Intravenous Q4H  . insulin aspart  0-15 Units Subcutaneous Q4H  . mouth rinse  15 mL Mouth Rinse q12n4p  . pantoprazole (PROTONIX) IV  40 mg Intravenous Q12H  . sodium chloride flush  10-40 mL Intracatheter Q12H   Continuous Infusions: . sodium chloride 10 mL/hr at 11/21/17 0500  . TPN (CLINIMIX) Adult without lytes 60 mL/hr at 11/21/17 0500  . TPN (CLINIMIX) Adult without lytes     PRN Meds:.acetaminophen, albuterol, ALPRAZolam, diphenhydrAMINE **OR** diphenhydrAMINE, lip balm, naLOXone (NARCAN)  injection, naloxone  **AND** sodium chloride flush, ondansetron (ZOFRAN) IV, sodium chloride flush, sodium chloride flush   PHYSICAL EXAM: Vital signs: Vitals:   11/21/17 0600 11/21/17 0800 11/21/17 0846 11/21/17 1141  BP:  135/60    Pulse: (!) 107 95    Resp: (!) 25 (!) 33 (!) 35 (!) 30  Temp:  99.6 F (37.6 C)    TempSrc:  Oral    SpO2: 94% 98% 98% 96%  Weight: (!) 139.1 kg (306 lb 10.6 oz)     Height:       Filed Weights   11/19/17 1825 11/20/17 0248 11/21/17 0600  Weight: 135.9 kg (299 lb 9.7 oz) (!) 138.2 kg (304 lb 10.8 oz) (!) 139.1 kg (306 lb 10.6 oz)   Body mass index is 44 kg/m.   General appearance :Awake, alert, in some amount of distress due to pain and discomfort. Eyes:, pupils equally reactive to light and accomodation,no scleral icterus. HEENT: Atraumatic and Normocephalic Neck: supple Resp:Good air entry bilaterally, clear to auscultation anteriorly CVS: S1 S2 regular, no murmurs.  GI: Bowel sounds present, tender in the upper abdomen-somewhat distended/obese.   Extremities: B/L Lower Ext shows ++ edema, both legs are warm to touch Neurology:  speech  clear,Non focal-but has significant generalized weakness Psychiatric: Normal judgment and insight. Normal mood. Musculoskeletal:No digital cyanosis Skin:No Rash, warm and dry Wounds:N/A  I have personally reviewed following labs and imaging studies  LABORATORY DATA: CBC: Recent Labs  Lab 11/16/17 0444 11/17/17 0452 11/19/17 0402  WBC 20.1* 20.1* 18.7*  NEUTROABS  --  17.9*  --   HGB 8.2* 8.2* 8.1*  HCT 25.8* 25.5* 25.5*  MCV 95.2 95.1 94.1  PLT 541* 559* 470*    Basic Metabolic Panel: Recent Labs  Lab 11/16/17 0444 11/17/17 0452 11/18/17 0517 11/19/17 0402 11/20/17 0448 11/21/17 0554  NA 147* 149* 145 143 140 139  K 3.9 3.7 3.7 3.7 3.9 3.7  CL 116* 119* 117* 114* 114* 110  CO2 25 21* 23 22 20* 21*  GLUCOSE 183* 169* 162* 162* 180* 146*  BUN 61* 58* 56* 50* 59* 61*  CREATININE 2.09* 1.81* 1.79* 1.60* 1.97*  1.82*  CALCIUM 7.1* 7.4* 7.6* 7.7* 7.9* 8.0*  MG  --  2.1  --  1.8 1.8 1.6*  PHOS 4.4 4.1  --  4.5 5.3* 4.5    GFR: Estimated Creatinine Clearance: 53.3 mL/min (A) (by C-G formula based on SCr of 1.82 mg/dL (H)).  Liver Function Tests: Recent Labs  Lab 11/15/17 0249 11/16/17 0444 11/17/17 0452 11/19/17 0402 11/20/17 0448  AST  --   --  92* 83* 38  ALT  --   --  72* 89* 58*  ALKPHOS  --   --  129* 182* 160*  BILITOT  --   --  0.5 0.6 0.4  PROT  --   --  5.8* 5.8* 5.9*  ALBUMIN 1.9* 1.9* 1.9* 1.8* 1.9*   No results for input(s): LIPASE, AMYLASE in the last 168 hours. No results for input(s): AMMONIA in the last 168 hours.  Coagulation Profile: No results for input(s): INR, PROTIME in the last 168 hours.  Cardiac Enzymes: No results for input(s): CKTOTAL, CKMB, CKMBINDEX, TROPONINI in the last 168 hours.  BNP (last 3 results) No results for input(s): PROBNP in the last 8760 hours.  HbA1C: No results for input(s): HGBA1C in the last 72 hours.  CBG: Recent Labs  Lab 11/20/17 1927 11/20/17 2335 11/21/17 0323 11/21/17 0811 11/21/17 1239  GLUCAP 189* 142* 137* 133* 134*    Lipid Profile: No results for input(s): CHOL, HDL, LDLCALC, TRIG, CHOLHDL, LDLDIRECT in the last 72 hours.  Thyroid Function Tests: No results for input(s): TSH, T4TOTAL, FREET4, T3FREE, THYROIDAB in the last 72 hours.  Anemia Panel: No results for input(s): VITAMINB12, FOLATE, FERRITIN, TIBC, IRON, RETICCTPCT in the last 72 hours.  Urine analysis:    Component Value Date/Time   COLORURINE AMBER (A) 11/06/2017 1245   APPEARANCEUR CLOUDY (A) 11/06/2017 1245   LABSPEC 1.018 11/06/2017 1245   PHURINE 5.0 11/06/2017 1245   GLUCOSEU 50 (A) 11/06/2017 1245   HGBUR LARGE (A) 11/06/2017 1245   BILIRUBINUR NEGATIVE 11/06/2017 1245   KETONESUR NEGATIVE 11/06/2017 1245   PROTEINUR 30 (A) 11/06/2017 1245   NITRITE NEGATIVE 11/06/2017 1245   LEUKOCYTESUR NEGATIVE 11/06/2017 1245    Sepsis  Labs: Lactic Acid, Venous No results found for: LATICACIDVEN  MICROBIOLOGY: No results found for this or any previous visit (from the past 240 hour(s)).  RADIOLOGY STUDIES/RESULTS: Ct Abdomen Pelvis Wo Contrast  Result Date: 11/11/2017 CLINICAL DATA:  57 year old female with a history of necrotizing pancreatitis now with worsening leukocytosis. Evaluate for evidence of superinfection. EXAM: CT ABDOMEN AND PELVIS WITHOUT CONTRAST TECHNIQUE: Multidetector CT  imaging of the abdomen and pelvis was performed following the standard protocol without IV contrast. COMPARISON:  Prior CT scan of the abdomen and pelvis 11/30/2017 FINDINGS: Lower chest: Small bilateral pleural effusions and associated lower lobe atelectasis. The intracardiac blood pool is hypodense relative to the adjacent myocardium consistent with anemia. No pericardial effusion. Unremarkable distal thoracic esophagus. Hepatobiliary: Severe hypoattenuation of the hepatic parenchyma consistent with hepatic steatosis. There is some sparing around the gallbladder fossa. High attenuation material within the gallbladder consistent with sludge and/or small stones. No intra or extrahepatic biliary ductal dilatation. Pancreas: The pancreas is diffusely edematous. No discrete mass or pseudocyst. Inflammatory stranding extends throughout the peripancreatic soft tissues and into the mesenteric root. The degree of peripancreatic inflammatory stranding has significantly progressed compared to 30-Nov-2017. Evaluation is limited in the absence of intravenous contrast. Spleen: Normal in size without focal abnormality. Adrenals/Urinary Tract: Adrenal glands are unremarkable. Kidneys are normal, without renal calculi, focal lesion, or hydronephrosis. Foley catheter in the bladder. Stomach/Bowel: No evidence of obstruction or focal bowel wall thickening. Normal appendix in the right lower quadrant. The terminal ileum is unremarkable. Vascular/Lymphatic: Limited  evaluation in the absence of intravenous contrast. No aneurysm, significant atherosclerotic calcifications or suspicious lymphadenopathy. Reproductive: Uterus and bilateral adnexa are unremarkable. Other: Progressive inflammatory stranding throughout the mesenteric root and peripancreatic soft tissues. Small volume ascites along the greater curvature of the stomach, in the left perisplenic space and extending inferiorly along the left greater than right colic gutters to layer in the anatomic pelvis. No loculation or intra fluid gas to suggest infection. Musculoskeletal: No acute fracture or aggressive appearing lytic or blastic osseous lesion. IMPRESSION: 1. Progressive severe pancreatitis and secondary inflammatory changes throughout the peripancreatic soft tissues and mesenteric root. There is now small volume ascites which is likely reactive in nature. No loculation or intra fluid gas to suggest infection. 2. Small bilateral pleural effusions and associated atelectasis. 3. The intracardiac blood pool is hypodense relative to the adjacent myocardium consistent with anemia. 4. At least moderate hepatic steatosis. 5. Cholelithiasis. Electronically Signed   By: Malachy Moan M.D.   On: 11/11/2017 14:57   Dg Abd 1 View  Result Date: 11/10/2017 CLINICAL DATA:  NG tube placement Best obtainable notes due to patients size, pain and altered mental status. I could not get her to stop rocking and talking EXAM: ABDOMEN - 1 VIEW COMPARISON:  Earlier film of the same day FINDINGS: Weighted enteric tube tip is in the gastric body as before. Normal bowel gas pattern. The lower abdomen is is excluded. IMPRESSION: 1. Stable placement of feeding tube tip in the gastric body. Electronically Signed   By: Corlis Leak M.D.   On: 11/10/2017 19:29   Dg Abd 1 View  Result Date: 11/07/2017 CLINICAL DATA:  Orogastric tube placement. EXAM: ABDOMEN - 1 VIEW COMPARISON:  None. FINDINGS: Orogastric tube extends into the stomach.  Bowel gas pattern shows probable ileus. IMPRESSION: Orogastric tube extends into the stomach. Electronically Signed   By: Irish Lack M.D.   On: 11/07/2017 15:23   US Abdomen Complete  Result Date: Nov 30, 2017 CLINICAL DATA:  Epigastric and right upper quadrant discomfort, syncope, vomiting, hematuria. EXAM: ABDOMEN ULTRASOUND COMPLETE COMPARISON:  Abdominal and pelvic CT scan of today's date FINDINGS: Gallbladder: Probable small stones. No gallbladder wall thickening or pericholecystic fluid or positive sonographic Murphy's sign. Common bile duct: Diameter: 6.7 mm Liver: Visualization of the hepatic parenchyma is limited by bowel poor penetration of the ultrasound beam. The echotexture is  subjectively increased. There is no focal mass or ductal dilation. Portal vein is patent on color Doppler imaging with normal direction of blood flow towards the liver. IVC: No abnormality visualized. Pancreas: Visualization of the pancreas is limited due to bowel gas. Spleen: Size and appearance within normal limits. Right Kidney: Length: 13.2 cm. Echogenicity within normal limits. No mass or hydronephrosis visualized. Left Kidney: Length: 11.9 cm. Echogenicity within normal limits. No mass or hydronephrosis visualized. Abdominal aorta: Bowel gas limits evaluation of the abdominal aorta. Other findings: No significant ascites is observed. IMPRESSION: The study is limited overall due to bowel gas and the patient's body habitus. Multiple tiny gallstones are suspected. There is mild prominence of the common bile duct without definite intraluminal stones. Increased hepatic echotexture compatible with fatty infiltrative change. Poor evaluation of the pancreas due to bowel gas and surrounding inflammatory changes. Electronically Signed   By: David  [place] M.D.   On: 11-19-2017 14:02   US Renal  Result Date: 11/06/2017 CLINICAL DATA:  Acute kidney injury. EXAM: RENAL / URINARY TRACT ULTRASOUND COMPLETE COMPARISON:  None.  FINDINGS: Right Kidney: Length: 12.8 cm. Echogenicity within normal limits. No mass or hydronephrosis visualized. Left Kidney: Length: 11.1 cm. Echogenicity within normal limits. No mass or hydronephrosis visualized. Bladder: Not visualize. Other: Small volume pelvic ascites. IMPRESSION: 1. Normal renal ultrasound. Electronically Signed   By: Elige Ko   On: 11/06/2017 15:15   Mr 3d Recon At Scanner  Result Date: 11/04/2017 CLINICAL DATA:  Pancreatitis with gallstones. EXAM: MRI ABDOMEN WITHOUT AND WITH CONTRAST (INCLUDING MRCP) TECHNIQUE: Multiplanar multisequence MR imaging of the abdomen was performed both before and after the administration of intravenous contrast. Heavily T2-weighted images of the biliary and pancreatic ducts were obtained, and three-dimensional MRCP images were rendered by post processing. CONTRAST:  20 cc MultiHance COMPARISON:  CT scan 11/03/2016 FINDINGS: Markedly motion degraded study. Lower chest: The heart is enlarged without substantial pericardial effusion. Hepatobiliary: Liver measures 20.8 cm and craniocaudal length, enlarged. Multiple gallstones are identified, measuring up to 7 mm diameter. Some stones or trapped behind a fold in the gallbladder fundus. MRCP images are markedly motion degraded and nondiagnostic for tiny common duct stones. Axial T2 weighted single shot imaging shows common duct measuring 7 mm diameter. Common bile duct in the head of the pancreas also measures 7 mm diameter. The axial T2 weighted sequences reveal some central flow artifact in the common duct although a very tiny dependent signal void is seen in the common bile duct on axial image 34 series 5 that maps to a tiny filling defect in the common bile duct seen on coronal MRCP image 34 of series 6. This appearance is compatible with a tiny common bile duct stone. Pancreas: . pancreas is diffusely edematous with peripancreatic edema tracking diffusely in the anterior pararenal space. Postcontrast  imaging shows enhancement in the head of pancreas extreme pancreatic tail but most of the body and tail of pancreas do not enhance. No focal or rim enhancing fluid collection identified in the retroperitoneal space. Spleen:  No splenomegaly. No focal mass lesion. Adrenals/Urinary Tract: No adrenal nodule or mass. Limited assessment of the kidney shows no hydronephrosis or gross mass lesion. Stomach/Bowel: Stomach is nondistended. No gastric wall thickening. No evidence of outlet obstruction. Duodenum is normally positioned as is the ligament of Treitz. No small bowel or colonic dilatation within the visualized abdomen. Vascular/Lymphatic: Portal vein and superior mesenteric vein are patent. Splenic vein is patent. Celiac axis and SMA are patent.  Small retroperitoneal / peripancreatic lymph nodes noted. Other: Free fluid is noted around the liver and spleen. Mesenteric edema/fluid is evident. Musculoskeletal: No abnormal marrow enhancement within the visualized bony anatomy. IMPRESSION: 1. Markedly motion degraded study. 2. Cholelithiasis with borderline distention of the extrahepatic bile ducts. 3 mm filling defect in the common duct as it enters the head of the pancreas is consistent with with choledocholithiasis. 3. Nonenhancement involving much of the pancreatic body and tail parenchyma, highly suggestive of pancreatic necrosis. 4. Extensive edema/inflammation in the retroperitoneal space with intraperitoneal fluid and diffuse mesenteric edema in the abdomen. No focal/organized or rim enhancing fluid collection at this time to suggest evolving pseudocyst or abscess. 5. Hepatomegaly. 6. Portal vein is patent. Electronically Signed   By: Kennith Center M.D.   On: 11/04/2017 08:12   Dg Chest Port 1 View  Result Date: 11/11/2017 CLINICAL DATA:  Pancreatitis.  Shortness of breath. EXAM: PORTABLE CHEST 1 VIEW COMPARISON:  11/09/2017. FINDINGS: Interim extubation. Interim placement of feeding scratched it interim  removal of NG tube placement of feeding tube. Right IJ line in stable position. Cardiomegaly. Low lung volumes. Left lower lobe infiltrate suggesting pneumonia. Asymmetric pulmonary edema could also present this fashion. Small left pleural effusion. No pneumothorax. IMPRESSION: 1. Interim extubation and removal of NG tube. Interim placement of feeding tube, its tip is below left hemidiaphragm. Right IJ line stable position. 2. Low lung volumes with basilar atelectasis. Left lower lobe infiltrate/edema and small left pleural effusion. 3.  Stable cardiomegaly. Electronically Signed   By: Maisie Fus  Register   On: 11/11/2017 06:57   Dg Chest Port 1 View  Result Date: 11/09/2017 CLINICAL DATA:  Hypoxia EXAM: PORTABLE CHEST 1 VIEW COMPARISON:  November 08, 2017 FINDINGS: Endotracheal tube tip is 3.2 cm above the carina. Nasogastric tube tip and side port are below the diaphragm. Central catheter tip is in the superior vena cava. No pneumothorax. There is persistent atelectatic change in the left base with small left pleural effusion. There is mild right base atelectasis. Heart is upper normal in size with pulmonary vascularity within normal limits. No adenopathy. No bone lesions. IMPRESSION: Tube and catheter positions as described without pneumothorax. Persistent bibasilar atelectasis, more on the left than on the right, stable. No new opacity. Small left pleural effusion. Stable cardiac silhouette. Electronically Signed   By: Bretta Bang III M.D.   On: 11/09/2017 07:16   Dg Chest Port 1 View  Result Date: 11/08/2017 CLINICAL DATA:  Hypoxia EXAM: PORTABLE CHEST 1 VIEW COMPARISON:  November 07, 2017 FINDINGS: Endotracheal tube tip is 3.7 cm above the carina. Nasogastric tube tip and side port are below the diaphragm. Central catheter tip is in the superior vena cava. No pneumothorax. There is atelectatic change in both lung bases, more on the left than on the right, stable. There is a minimal pleural effusion on  each side. Heart is mildly enlarged with pulmonary vascularity within normal limits. No adenopathy. No bone lesions. IMPRESSION: Tube and catheter positions as described without evident pneumothorax. Bibasilar atelectasis, more on the left than on the right, with small bilateral pleural effusions. Stable cardiac prominence. Electronically Signed   By: Bretta Bang III M.D.   On: 11/08/2017 07:13   Dg Chest Port 1 View  Result Date: 11/07/2017 CLINICAL DATA:  Central line placement. EXAM: PORTABLE CHEST 1 VIEW COMPARISON:  0240 hours FINDINGS: Interval placement of right jugular non tunneled hemodialysis catheter with the catheter tip at the level of the mid SVC. No  pneumothorax. Endotracheal tube remains present with the tip approximately 3.5 cm above the carina. Gastric decompression tube extends into the stomach. Lungs show improved aeration bilaterally and decrease in pulmonary edema. Probable component of left pleural fluid. IMPRESSION: Non tunneled dialysis catheter tip in SVC. No pneumothorax identified after placement. Lungs show improved aeration with decrease in pulmonary edema. Electronically Signed   By: Irish Lack M.D.   On: 11/07/2017 15:22   Dg Chest Port 1 View  Result Date: 11/07/2017 CLINICAL DATA:  57 y/o  F; post intubation, ET tube position. EXAM: PORTABLE CHEST 1 VIEW COMPARISON:  11/06/2017 chest radiograph FINDINGS: Low lung volumes. Stable enlarged cardiac silhouette given projection and technique. Increased diffuse hazy airspace opacities and small effusions probably representing pulmonary edema. Endotracheal tube is 1.4 cm from carina. No acute osseous abnormality identified. IMPRESSION: Endotracheal tube 1.4 cm from carina. Increased hazy lung opacities compatible with pulmonary edema and small effusions bilaterally. Electronically Signed   By: Mitzi Hansen M.D.   On: 11/07/2017 04:24   Dg Chest Port 1 View  Result Date: 11/06/2017 CLINICAL DATA:  Acute  respiratory failure EXAM: PORTABLE CHEST 1 VIEW COMPARISON:  11/04/2017 FINDINGS: Hypoventilation with decreased lung volume. Increase in bibasilar atelectasis left greater than right. Probable left pleural effusion. Pulmonary vascular congestion suggesting mild heart failure. IMPRESSION: Hypoventilation with increased atelectasis in lung bases left greater than right. Small left effusion. Progression of pulmonary vascular congestion suggesting mild fluid overload. Electronically Signed   By: Marlan Palau M.D.   On: 11/06/2017 09:05   Dg Chest Port 1 View  Result Date: 11/04/2017 CLINICAL DATA:  Shortness of breath, weakness, and fever. EXAM: PORTABLE CHEST 1 VIEW COMPARISON:  Report from prior chest radiograph 10/19/2013 FINDINGS: The patient is rotated to the right on today's radiograph, reducing diagnostic sensitivity and specificity. Mild enlargement of the cardiopericardial silhouette. Prominence of mediastinal contour is probably from fatty mediastinal tissues based on comparison to the recent abdomen MRI. Low lung volumes are present, causing crowding of the pulmonary vasculature. Indistinct airspace opacity at the left lung base compatible with atelectasis or pneumonia. Similar indistinct density at the right lung base medially. Rightward tracheal deviation, partly rotation related IMPRESSION: 1. Left greater than right airspace opacities in the lung bases favoring atelectasis or pneumonia. 2. Low lung volumes are present, causing crowding of the pulmonary vasculature. 3. Mild enlargement of the cardiopericardial silhouette. Electronically Signed   By: Gaylyn Rong M.D.   On: 11/04/2017 14:27   Dg Chest Port 1v Same Day  Result Date: 11/19/2017 CLINICAL DATA:  Increase shortness of breath EXAM: PORTABLE CHEST 1 VIEW COMPARISON:  11/11/2017 FINDINGS: 0851 hours. Low lung volumes. The cardio pericardial silhouette is enlarged. There is pulmonary vascular congestion without overt pulmonary  edema. Persistent left base collapse/consolidation, similar. Right PICC line tip overlies the distal SVC. The visualized bony structures of the thorax are intact. IMPRESSION: No substantial interval change. Cardiomegaly with vascular congestion and left base collapse/consolidation. Electronically Signed   By: Kennith Center M.D.   On: 11/19/2017 10:02   Dg Abd Decub  Result Date: 11/19/2017 CLINICAL DATA:  Ileus, abdominal distention. EXAM: ABDOMEN - 1 VIEW DECUBITUS COMPARISON:  11/13/2017 and CT abdomen pelvis 11/11/2017. FINDINGS: Two left lateral decubitus views of the abdomen show air-fluid levels in dilated bowel. No definite free air. Abdomen is incompletely imaged. IMPRESSION: Gaseous distention of bowel with air-fluid levels is compatible with the given history of an ileus. Abdomen is incompletely imaged. Electronically Signed  By: Leanna Battles M.D.   On: 11/19/2017 12:02   Dg Abd Portable 1v  Result Date: 11/13/2017 CLINICAL DATA:  NG tube placement. EXAM: PORTABLE ABDOMEN - 1 VIEW COMPARISON:  No recent prior. FINDINGS: NG tube noted with tip coiled stomach. No bowel distention noted. Bibasilar atelectasis. No acute bony abnormality noted. IMPRESSION: NG tube noted with tip coiled in the stomach. Electronically Signed   By: Maisie Fus  Register   On: 11/13/2017 10:07   Dg Abd Portable 1v  Result Date: 11/10/2017 CLINICAL DATA:  Status post feeding tube placement. EXAM: PORTABLE ABDOMEN - 1 VIEW COMPARISON:  Abdominal radiograph of 07 November 2017 FINDINGS: The nasogastric tube has been removed and replaced with a radiodense tipped feeding tube. Thetube tip is in the mid gastric body. IMPRESSION: The tip of the feeding tube is in the mid gastric body. Electronically Signed   By: David  [place] M.D.   On: 11/10/2017 11:28   Mr Abdomen Mrcp Vivien Rossetti Contast  Result Date: 11/04/2017 CLINICAL DATA:  Pancreatitis with gallstones. EXAM: MRI ABDOMEN WITHOUT AND WITH CONTRAST (INCLUDING MRCP) TECHNIQUE:  Multiplanar multisequence MR imaging of the abdomen was performed both before and after the administration of intravenous contrast. Heavily T2-weighted images of the biliary and pancreatic ducts were obtained, and three-dimensional MRCP images were rendered by post processing. CONTRAST:  20 cc MultiHance COMPARISON:  CT scan 11/03/2016 FINDINGS: Markedly motion degraded study. Lower chest: The heart is enlarged without substantial pericardial effusion. Hepatobiliary: Liver measures 20.8 cm and craniocaudal length, enlarged. Multiple gallstones are identified, measuring up to 7 mm diameter. Some stones or trapped behind a fold in the gallbladder fundus. MRCP images are markedly motion degraded and nondiagnostic for tiny common duct stones. Axial T2 weighted single shot imaging shows common duct measuring 7 mm diameter. Common bile duct in the head of the pancreas also measures 7 mm diameter. The axial T2 weighted sequences reveal some central flow artifact in the common duct although a very tiny dependent signal void is seen in the common bile duct on axial image 34 series 5 that maps to a tiny filling defect in the common bile duct seen on coronal MRCP image 34 of series 6. This appearance is compatible with a tiny common bile duct stone. Pancreas: . pancreas is diffusely edematous with peripancreatic edema tracking diffusely in the anterior pararenal space. Postcontrast imaging shows enhancement in the head of pancreas extreme pancreatic tail but most of the body and tail of pancreas do not enhance. No focal or rim enhancing fluid collection identified in the retroperitoneal space. Spleen:  No splenomegaly. No focal mass lesion. Adrenals/Urinary Tract: No adrenal nodule or mass. Limited assessment of the kidney shows no hydronephrosis or gross mass lesion. Stomach/Bowel: Stomach is nondistended. No gastric wall thickening. No evidence of outlet obstruction. Duodenum is normally positioned as is the ligament of  Treitz. No small bowel or colonic dilatation within the visualized abdomen. Vascular/Lymphatic: Portal vein and superior mesenteric vein are patent. Splenic vein is patent. Celiac axis and SMA are patent. Small retroperitoneal / peripancreatic lymph nodes noted. Other: Free fluid is noted around the liver and spleen. Mesenteric edema/fluid is evident. Musculoskeletal: No abnormal marrow enhancement within the visualized bony anatomy. IMPRESSION: 1. Markedly motion degraded study. 2. Cholelithiasis with borderline distention of the extrahepatic bile ducts. 3 mm filling defect in the common duct as it enters the head of the pancreas is consistent with with choledocholithiasis. 3. Nonenhancement involving much of the pancreatic body and tail parenchyma,  highly suggestive of pancreatic necrosis. 4. Extensive edema/inflammation in the retroperitoneal space with intraperitoneal fluid and diffuse mesenteric edema in the abdomen. No focal/organized or rim enhancing fluid collection at this time to suggest evolving pseudocyst or abscess. 5. Hepatomegaly. 6. Portal vein is patent. Electronically Signed   By: Kennith CenterEric  Mansell M.D.   On: 11/04/2017 08:12   Ct Renal Stone Study  Result Date: 10/22/2017 CLINICAL DATA:  Left-sided flank pain and hematuria.  Vomiting. EXAM: CT ABDOMEN AND PELVIS WITHOUT CONTRAST TECHNIQUE: Multidetector CT imaging of the abdomen and pelvis was performed following the standard protocol without oral or intravenous contrast material administration. COMPARISON:  CT abdomen and pelvis April 09, 2008 FINDINGS: Lower chest: There is bibasilar atelectatic change. Hepatobiliary: There is hepatic steatosis. No focal liver lesions are evident on this noncontrast enhanced study. Gallbladder wall is not appreciably thickened. There is no biliary duct dilatation. Pancreas: There is edema throughout the pancreas with soft tissue stranding in the adjacent peripancreatic mesentery throughout the peripancreatic  region. Fluid tracks to the right of the pancreatic head and inferior to the uncinate process as well as anterior to the pancreatic head. Fluid tracks from the body and tail the pancreas anteriorly to reach the greater curvature of the stomach in the midportion of the gastric body region. A well-defined pseudocyst type lesion is not seen. There is no pancreatic duct dilatation. Spleen: No splenic lesions are evident. Adrenals/Urinary Tract: Adrenals bilaterally appear normal. Kidneys bilaterally show no evident mass or hydronephrosis. There is a 1 mm calculus in the upper pole left kidney, nonobstructing. There is no evident ureteral calculus on either side. Urinary bladder is midline with wall thickness within normal limits. Stomach/Bowel: There is mild wall thickening along the greater curvature of the stomach in the midbody region in along the second and third duodenum due to nearby pancreatitis. No other bowel wall thickening is evident. There is no appreciable bowel obstruction. No evident free air or portal venous air. Vascular/Lymphatic: There is no abdominal aortic aneurysm. No vascular lesions are appreciable on this study. No adenopathy is evident in the abdomen or pelvis. Reproductive: Uterus is anteverted. There is an apparent leiomyoma extending from the leftward aspect of the uterus anteriorly measuring 4.3 x 4.0 cm. No extrauterine pelvic mass evident. Other: Appendix appears normal. No abscess is seen in the abdomen or pelvis. No free pelvic fluid seen. Loculated fluid adjacent to the pancreas is noted. Musculoskeletal: There is degenerative change in the lumbar spine. There are no blastic or lytic bone lesions. There is no intramuscular or abdominal wall lesion evident. IMPRESSION: 1. Acute pancreatitis with pancreatic edema and peripancreatic fluid, most notably anterior to the body and tail of the pancreas. No well-defined pseudocyst or noncystic mass. No pancreatic duct dilatation. 2. Areas of  gastritis and duodenitis due to adjacent pancreatitis causing inflammation in these areas. No bowel obstruction. No abscess. Appendix appears normal. 3. 1 mm nonobstructing calculus upper pole left kidney. No hydronephrosis or ureteral calculus on either side. 4.  Hepatic steatosis. 5. 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 Electronically Signed   By: Bretta BangWilliam  Woodruff III M.D.   On: 11/06/2017 12:04     LOS: 18 days   Jeoffrey MassedShanker Sherrol Vicars, MD  [facility] Pager:[phone]  If 7PM-7AM, please contact night-coverage [email or web address] Password [email or web address] 11/21/2017, 1:35 PM

## 2017-11-21 NOTE — Progress Notes (Signed)
PT Cancellation Note  Patient Details Name: Emma Washington MRN: [id number] DOB: 01-Mar-1961   Cancelled Treatment:      pt transferred to ICU.  Pt has been evaluated with rec for SNF.  Last session, pt was only able to transfer to recliner + 2 assist.  Very weak. Will attempt another day as schedule permits.    Felecia ShellingLori Ermine Stebbins  PTA [facility]  Acute  Rehab Pager      [phone]

## 2017-11-21 DEATH — deceased

## 2017-11-22 LAB — GLUCOSE, CAPILLARY
GLUCOSE-CAPILLARY: 135 mg/dL — AB (ref 65–99)
GLUCOSE-CAPILLARY: 139 mg/dL — AB (ref 65–99)
GLUCOSE-CAPILLARY: 143 mg/dL — AB (ref 65–99)
GLUCOSE-CAPILLARY: 160 mg/dL — AB (ref 65–99)
Glucose-Capillary: 135 mg/dL — ABNORMAL HIGH (ref 65–99)
Glucose-Capillary: 163 mg/dL — ABNORMAL HIGH (ref 65–99)
Glucose-Capillary: 144 mg/dL — ABNORMAL HIGH (ref 65–99)

## 2017-11-22 LAB — BASIC METABOLIC PANEL
Anion gap: 9 (ref 5–15)
BUN: 60 mg/dL — AB (ref 6–20)
CALCIUM: 8.1 mg/dL — AB (ref 8.9–10.3)
CHLORIDE: 108 mmol/L (ref 101–111)
CO2: 21 mmol/L — ABNORMAL LOW (ref 22–32)
CREATININE: 1.78 mg/dL — AB (ref 0.44–1.00)
GFR, EST AFRICAN AMERICAN: 36 mL/min — AB (ref >60)
GFR, EST NON AFRICAN AMERICAN: 31 mL/min — AB (ref >60)
Glucose, Bld: 148 mg/dL — ABNORMAL HIGH (ref 65–99)
Potassium: 3.9 mmol/L (ref 3.5–5.1)
SODIUM: 138 mmol/L (ref 135–145)

## 2017-11-22 LAB — PHOSPHORUS: Phosphorus: 4.7 mg/dL — ABNORMAL HIGH (ref 2.5–4.6)

## 2017-11-22 LAB — MAGNESIUM: MAGNESIUM: 1.9 mg/dL (ref 1.7–2.4)

## 2017-11-22 MED ORDER — TRACE MINERALS CR-CU-MN-SE-ZN 10-1000-500-60 MCG/ML IV SOLN
INTRAVENOUS | Status: AC
  Administered 2017-11-22: 18:00:00 via INTRAVENOUS
  Filled 2017-11-22 (×2): qty 1992

## 2017-11-22 MED ORDER — FAT EMULSION 20 % IV EMUL
240.0000 mL | INTRAVENOUS | Status: AC
  Administered 2017-11-22: 240 mL via INTRAVENOUS
  Filled 2017-11-22: qty 250

## 2017-11-22 NOTE — Progress Notes (Signed)
PHARMACY - ADULT TOTAL PARENTERAL NUTRITION CONSULT NOTE   Pharmacy Consult for TPN Indication: severe pancreatitis  Patient Measurements: Height: _0  (177.8 cm) Weight: (!) 307 lb 5.1 oz (139.4 kg)(Simultaneous filing. User may not have seen previous data.) IBW/kg (Calculated) : 68.5 TPN AdjBW (KG): 85.3 Body mass index is 44.1 kg/m.  Insulin Requirements: 13 units Novolog (moderate scale) in previous 24hrs CL diet likely increased CBGs  Current Nutrition: back to CL diet 1/30, TPN not yet at goal Boost Breeze BID, each supplement provides 250 kcal and 9 grams of protein.  Pt appears to be refusing 1 per day and taking 1 per day so far.  IVF: 1/2 NS at [facility], asked RN if this can be saline locked   Central access: R IJ HD catheter TPN start date: 1/24  ASSESSMENT                                                                                                          HPI: 57 y.o. Female with gallstone pancreatitis with necrosis s/p MRCP on 1/15.  Repeat LFTs and lipase normalizing.  No plans for surgical intervention at this time; planning for delayed cholecystectomy per [facility].  Tube feeding was started but had to be stopped on 1/22 due to nausea/vomiting.  Pharmacy is consulted to begin TPN. Patient's course has been complicated with respiratory failure and intubation and subsequent extubation on 11/09/2017.  She was also on CRRT from 1/18-1/23.  Significant events:  1/28: CL diet 1/29: FL diet 1/30: did not tolerate FLD, back to CLD, weight incr 305 > 314  2/2 Remains volume overloaded, but respiratory status is improved.  Avoiding lasix prior to contrast CT early next week.  Tolerating volume and OK to continue to advance towards goal rate per MD.  Today: 11/22/2017  Glucose: No hx DM. CBGs elevated since starting TPN but improved with addition of insulin to TPN (CBG 129-162).   Electrolytes: Phos increased to 4.7, withOUT electrolytes in TPN.  CorrCa 9.78 for Phos-Calcium product  of 46. Mag and K are WNL.  Renal: AKI - CRRT 1/18-1/23. SCr remains elevated off CRRT, but very slowly decreasing.  Last lasix given 1/30 (I/O net -740 on 2/1).    LFTs: AST/ALT elevated but improving, Alk phos elevated, Tbili WNL  TGs: 213 (1/15), 332 (1/18), improving 276 (1/28)  Prealbumin: 6.7 (1/25), 9.3 (1/28)  NUTRITIONAL GOALS                                                                                           RD recs 1/31: 2270-2405 kcal, >/= 135 grams protein per day  Clinimix 5/15 at a goal rate of 187m/hr will provide 132g/day protein,  1874Kcal/day. After 7 days, the addition of 20% fat emulsion at 73m/hr to provide 132g/day protein, 2354Kcal/day.  NOTE Patient may not be able to tolerate this amount of fluid given current renal function.  PLAN                                                                                                                         At 1800 today:  Advance Clinimix 5/15 (no electrolytes) to 83 ml/hr today per discussion with MD.   Start 20% lipid emulsion 20 ml/hr over 12 hours.  Monitor closely given elevated triglycerides, pancreatitis, and volume overload.  TPN to contain standard multivitamins and trace elements  Continue insulin in TPN, 70 units regular insulin per 24 hr bag  Continue SSI q4h MODERATE scale  TPN lab panels on Mondays & Thursdays. BMET, Mag, Phos in AM to trend electrolytes without lytes in TPN.  F/u daily.   CGretta ArabPharmD, BCPS Pager [phone]/11/2017 10:38 AM

## 2017-11-22 NOTE — Progress Notes (Signed)
PROGRESS NOTE        PATIENT DETAILS Name: Emma Washington Age: 57 y.o. Sex: female Date of Birth: 02-26-1961 Admit Date: November 06, 2017 Admitting Physician Albertine Grates, MD PCP:No primary care provider on file.  Brief Narrative: Patient is a 57 y.o. 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  She briefly required CRRT, subsequently extubated and transferred to try to hospitalist service.  Although improved-hospital course was complicated by respiratory distress secondary to volume overload/pulmonary edema on 1/30 requiring transfer to stepdown unit, worsening abdominal pain requiring initiation of fentanyl PCA on 2/1.  After discussion with general surgery/GI-plans are to proceed with a repeat CT scan of the abdomen sometime next week-hopefully with IV contrast if her renal function allows.  See below for further details  Subjective: Pain is much better controlled after starting fentanyl PCA.  She appears much more comfortable.  Assessment/Plan: Severe necrotizing gallstone pancreatitis: Overall improved-however a few days back was unable to tolerate advancement to full liquids.  She remains on clear liquids and on TNA.  Since she continued to have significant abdominal pain inspite of being on 75 mcg of transdermal fentanyl, and IV fentanyl for breakthrough pain-she was started on IV fentanyl PCA on 2/1 with significant relief of her symptoms.  Plans are to continue to provide supportive care and will repeat a CT scan of the abdomen and pelvis sometime early next week-preferably with IV contrast if her renal function allows.  GI and general surgery following  Acute kidney injury: Suspect this is hemodynamically mediated in the setting of severe pancreatitis-briefly required CRRT during this hospital stay.  She still has  some amount of lower extremity edema-suspect due to hypoalbuminemia-given Lasix a few days back for respiratory distress/pulmonary edema-with some elevation in her creatinine-we will allow the creatinine down trended further-she is not in any respiratory distress-and seems to be more comfortable today-continue to hold diuretics for now.  Continue to follow weights/intake output.  Acute hypoxic respiratory failure: She was intubated from 1/18-1/20.  She was medically stable on nasal cannula-however on 1/30 she developed worsening hypoxia and respiratory distress thought to be due to volume overload with some components fromanxiety and pain.  Answer to the stepdown unit and briefly required BiPAP and high-dose Lasix.  She currently is much improved and on nasal cannula.  Suspect she probably has some amount of OHS/OSA-continue nightly BiPAP/CPAP if she tolerates.  Anemia of critical illness: Hemoglobin currently stable-no indication for transfusion at this time  Volume overload: Secondary to acute illness/IV fluid resuscitation and hypoalbuminemia-holding diuretics for now-she does have some lower extremity edema-will reassess over the next few days regarding plans for further diuresis.  Morbid obesity  Suspected OHS/OSA: Outpatient sleep study-BiPAP nightly if she tolerates.  DVT Prophylaxis: Prophylactic Lovenox   Code Status: Full code  Family Communication: None at bedside  Disposition Plan: Remain inpatient-remain in stepdown .  Antimicrobial agents: Anti-infectives (From admission, onward)   Start     Dose/Rate Route Frequency Ordered Stop   11/12/17 1800  meropenem (MERREM) 1 g in sodium chloride 0.9 % 100 mL IVPB  Status:  Discontinued     1 g 200 mL/hr over 30 Minutes Intravenous Every 8 hours 11/12/17 1115 11/17/17 0954   11/11/17 1000  meropenem (MERREM) 1  g in sodium chloride 0.9 % 100 mL IVPB  Status:  Discontinued     1 g 200 mL/hr over 30 Minutes Intravenous Every 12  hours 11/11/17 0911 11/12/17 1115   11/09/17 1000  piperacillin-tazobactam (ZOSYN) IVPB 3.375 g  Status:  Discontinued     3.375 g 12.5 mL/hr over 240 Minutes Intravenous Every 8 hours 11/09/17 0614 11/09/17 0615   11/09/17 1000  piperacillin-tazobactam (ZOSYN) IVPB 3.375 g  Status:  Discontinued     3.375 g 100 mL/hr over 30 Minutes Intravenous Every 6 hours 11/09/17 0616 11/11/17 0911   11/07/17 2200  piperacillin-tazobactam (ZOSYN) IVPB 2.25 g  Status:  Discontinued     2.25 g 100 mL/hr over 30 Minutes Intravenous Every 8 hours 11/07/17 1218 11/07/17 1741   11/07/17 2200  piperacillin-tazobactam (ZOSYN) IVPB 3.375 g  Status:  Discontinued     3.375 g 12.5 mL/hr over 240 Minutes Intravenous Every 6 hours 11/07/17 1741 11/07/17 1747   11/07/17 2200  piperacillin-tazobactam (ZOSYN) IVPB 3.375 g  Status:  Discontinued     3.375 g 100 mL/hr over 30 Minutes Intravenous Every 6 hours 11/07/17 1747 11/09/17 0613   11/04/17 1100  piperacillin-tazobactam (ZOSYN) IVPB 3.375 g  Status:  Discontinued     3.375 g 12.5 mL/hr over 240 Minutes Intravenous Every 8 hours 11/04/17 1059 11/07/17 1218   11/09/2017 1200  piperacillin-tazobactam (ZOSYN) IVPB 3.375 g     3.375 g 100 mL/hr over 30 Minutes Intravenous  Once 09-Nov-2017 1157 09-Nov-2017 1309      Procedures: ETT 1/18 >> 1/20  CONSULTS:  pulmonary/intensive care, GI and general surgery  Time spent: 25 minutes -Greater than 50% of this time was spent in counseling, explanation of diagnosis, planning of further management, and coordination of care.  MEDICATIONS: Scheduled Meds: . chlorhexidine  15 mL Mouth Rinse BID  . Chlorhexidine Gluconate Cloth  6 each Topical Daily  . feeding supplement  1 Container Oral BID BM  . fentaNYL   Intravenous Q4H  . insulin aspart  0-15 Units Subcutaneous Q4H  . mouth rinse  15 mL Mouth Rinse q12n4p  . pantoprazole (PROTONIX) IV  40 mg Intravenous Q12H  . sodium chloride flush  10-40 mL Intracatheter Q12H    Continuous Infusions: . sodium chloride 10 mL/hr at 11/21/17 0500  . TPN (CLINIMIX) Adult without lytes     And  . fat emulsion    . TPN (CLINIMIX) Adult without lytes 60 mL/hr at 11/21/17 1713   PRN Meds:.acetaminophen, albuterol, ALPRAZolam, diphenhydrAMINE **OR** diphenhydrAMINE, lip balm, naLOXone (NARCAN)  injection, naloxone **AND** sodium chloride flush, ondansetron (ZOFRAN) IV, sodium chloride flush, sodium chloride flush   PHYSICAL EXAM: Vital signs: Vitals:   11/22/17 1000 11/22/17 1100 11/22/17 1130 11/22/17 1200  BP: 137/65 139/88  123/83  Pulse: 97 (!) 104 100 98  Resp: (!) 26 (!) 39 (!) 23 (!) 25  Temp:   (!) 97.4 F (36.3 C)   TempSrc:   Oral   SpO2: 96% 97% 97% 97%  Weight:      Height:       Filed Weights   11/20/17 0248 11/21/17 0600 11/22/17 0500  Weight: (!) 138.2 kg (304 lb 10.8 oz) (!) 139.1 kg (306 lb 10.6 oz) (!) 139.4 kg (307 lb 5.1 oz)   Body mass index is 44.1 kg/m.   General appearance :Awake, alert, not in any distress.  Eyes:, pupils equally reactive to light and accomodation,no scleral icterus. HEENT: Atraumatic and Normocephalic Neck: supple, no JVD.  Resp:Good air entry bilaterally, clear to auscultation anteriorly CVS: S1 S2 regular, no murmurs.  GI: Bowel sounds present, mildly tender in the upper abdomen.  Abdomen is distended/obese.  No peritoneal signs. Extremities: B/L Lower Ext shows + edema, both legs are warm to touch Neurology:  speech clear,Non focal, sensation is grossly intact. Psychiatric: Normal judgment and insight. Normal mood. Musculoskeletal:No digital cyanosis Skin:No Rash, warm and dry Wounds:N/A  I have personally reviewed following labs and imaging studies  LABORATORY DATA: CBC: Recent Labs  Lab 11/16/17 0444 11/17/17 0452 11/19/17 0402  WBC 20.1* 20.1* 18.7*  NEUTROABS  --  17.9*  --   HGB 8.2* 8.2* 8.1*  HCT 25.8* 25.5* 25.5*  MCV 95.2 95.1 94.1  PLT 541* 559* 470*    Basic Metabolic  Panel: Recent Labs  Lab 11/17/17 0452 11/18/17 0517 11/19/17 0402 11/20/17 0448 11/21/17 0554 11/22/17 0827  NA 149* 145 143 140 139 138  K 3.7 3.7 3.7 3.9 3.7 3.9  CL 119* 117* 114* 114* 110 108  CO2 21* 23 22 20* 21* 21*  GLUCOSE 169* 162* 162* 180* 146* 148*  BUN 58* 56* 50* 59* 61* 60*  CREATININE 1.81* 1.79* 1.60* 1.97* 1.82* 1.78*  CALCIUM 7.4* 7.6* 7.7* 7.9* 8.0* 8.1*  MG 2.1  --  1.8 1.8 1.6* 1.9  PHOS 4.1  --  4.5 5.3* 4.5 4.7*    GFR: Estimated Creatinine Clearance: 54.6 mL/min (A) (by C-G formula based on SCr of 1.78 mg/dL (H)).  Liver Function Tests: Recent Labs  Lab 11/16/17 0444 11/17/17 0452 11/19/17 0402 11/20/17 0448  AST  --  92* 83* 38  ALT  --  72* 89* 58*  ALKPHOS  --  129* 182* 160*  BILITOT  --  0.5 0.6 0.4  PROT  --  5.8* 5.8* 5.9*  ALBUMIN 1.9* 1.9* 1.8* 1.9*   No results for input(s): LIPASE, AMYLASE in the last 168 hours. No results for input(s): AMMONIA in the last 168 hours.  Coagulation Profile: No results for input(s): INR, PROTIME in the last 168 hours.  Cardiac Enzymes: No results for input(s): CKTOTAL, CKMB, CKMBINDEX, TROPONINI in the last 168 hours.  BNP (last 3 results) No results for input(s): PROBNP in the last 8760 hours.  HbA1C: No results for input(s): HGBA1C in the last 72 hours.  CBG: Recent Labs  Lab 11/21/17 2027 11/22/17 0038 11/22/17 0425 11/22/17 0748 11/22/17 1128  GLUCAP 162* 135* 139* 135* 163*    Lipid Profile: No results for input(s): CHOL, HDL, LDLCALC, TRIG, CHOLHDL, LDLDIRECT in the last 72 hours.  Thyroid Function Tests: No results for input(s): TSH, T4TOTAL, FREET4, T3FREE, THYROIDAB in the last 72 hours.  Anemia Panel: No results for input(s): VITAMINB12, FOLATE, FERRITIN, TIBC, IRON, RETICCTPCT in the last 72 hours.  Urine analysis:    Component Value Date/Time   COLORURINE AMBER (A) 11/06/2017 1245   APPEARANCEUR CLOUDY (A) 11/06/2017 1245   LABSPEC 1.018 11/06/2017 1245    PHURINE 5.0 11/06/2017 1245   GLUCOSEU 50 (A) 11/06/2017 1245   HGBUR LARGE (A) 11/06/2017 1245   BILIRUBINUR NEGATIVE 11/06/2017 1245   KETONESUR NEGATIVE 11/06/2017 1245   PROTEINUR 30 (A) 11/06/2017 1245   NITRITE NEGATIVE 11/06/2017 1245   LEUKOCYTESUR NEGATIVE 11/06/2017 1245    Sepsis Labs: Lactic Acid, Venous No results found for: LATICACIDVEN  MICROBIOLOGY: No results found for this or any previous visit (from the past 240 hour(s)).  RADIOLOGY STUDIES/RESULTS: Ct Abdomen Pelvis Wo Contrast  Result Date: 11/11/2017 CLINICAL  DATA:  57 year old female with a history of necrotizing pancreatitis now with worsening leukocytosis. Evaluate for evidence of superinfection. EXAM: CT ABDOMEN AND PELVIS WITHOUT CONTRAST TECHNIQUE: Multidetector CT imaging of the abdomen and pelvis was performed following the standard protocol without IV contrast. COMPARISON:  Prior CT scan of the abdomen and pelvis 11/18/2017 FINDINGS: Lower chest: Small bilateral pleural effusions and associated lower lobe atelectasis. The intracardiac blood pool is hypodense relative to the adjacent myocardium consistent with anemia. No pericardial effusion. Unremarkable distal thoracic esophagus. Hepatobiliary: Severe hypoattenuation of the hepatic parenchyma consistent with hepatic steatosis. There is some sparing around the gallbladder fossa. High attenuation material within the gallbladder consistent with sludge and/or small stones. No intra or extrahepatic biliary ductal dilatation. Pancreas: The pancreas is diffusely edematous. No discrete mass or pseudocyst. Inflammatory stranding extends throughout the peripancreatic soft tissues and into the mesenteric root. The degree of peripancreatic inflammatory stranding has significantly progressed compared to 11-18-17. Evaluation is limited in the absence of intravenous contrast. Spleen: Normal in size without focal abnormality. Adrenals/Urinary Tract: Adrenal glands are  unremarkable. Kidneys are normal, without renal calculi, focal lesion, or hydronephrosis. Foley catheter in the bladder. Stomach/Bowel: No evidence of obstruction or focal bowel wall thickening. Normal appendix in the right lower quadrant. The terminal ileum is unremarkable. Vascular/Lymphatic: Limited evaluation in the absence of intravenous contrast. No aneurysm, significant atherosclerotic calcifications or suspicious lymphadenopathy. Reproductive: Uterus and bilateral adnexa are unremarkable. Other: Progressive inflammatory stranding throughout the mesenteric root and peripancreatic soft tissues. Small volume ascites along the greater curvature of the stomach, in the left perisplenic space and extending inferiorly along the left greater than right colic gutters to layer in the anatomic pelvis. No loculation or intra fluid gas to suggest infection. Musculoskeletal: No acute fracture or aggressive appearing lytic or blastic osseous lesion. IMPRESSION: 1. Progressive severe pancreatitis and secondary inflammatory changes throughout the peripancreatic soft tissues and mesenteric root. There is now small volume ascites which is likely reactive in nature. No loculation or intra fluid gas to suggest infection. 2. Small bilateral pleural effusions and associated atelectasis. 3. The intracardiac blood pool is hypodense relative to the adjacent myocardium consistent with anemia. 4. At least moderate hepatic steatosis. 5. Cholelithiasis. Electronically Signed   By: Malachy Moan M.D.   On: 11/11/2017 14:57   Dg Abd 1 View  Result Date: 11/10/2017 CLINICAL DATA:  NG tube placement Best obtainable notes due to patients size, pain and altered mental status. I could not get her to stop rocking and talking EXAM: ABDOMEN - 1 VIEW COMPARISON:  Earlier film of the same day FINDINGS: Weighted enteric tube tip is in the gastric body as before. Normal bowel gas pattern. The lower abdomen is is excluded. IMPRESSION: 1. Stable  placement of feeding tube tip in the gastric body. Electronically Signed   By: Corlis Leak M.D.   On: 11/10/2017 19:29   Dg Abd 1 View  Result Date: 11/07/2017 CLINICAL DATA:  Orogastric tube placement. EXAM: ABDOMEN - 1 VIEW COMPARISON:  None. FINDINGS: Orogastric tube extends into the stomach. Bowel gas pattern shows probable ileus. IMPRESSION: Orogastric tube extends into the stomach. Electronically Signed   By: Irish Lack M.D.   On: 11/07/2017 15:23   US Abdomen Complete  Result Date: 11-18-2017 CLINICAL DATA:  Epigastric and right upper quadrant discomfort, syncope, vomiting, hematuria. EXAM: ABDOMEN ULTRASOUND COMPLETE COMPARISON:  Abdominal and pelvic CT scan of today's date FINDINGS: Gallbladder: Probable small stones. No gallbladder wall thickening or pericholecystic fluid or  positive sonographic Murphy's sign. Common bile duct: Diameter: 6.7 mm Liver: Visualization of the hepatic parenchyma is limited by bowel poor penetration of the ultrasound beam. The echotexture is subjectively increased. There is no focal mass or ductal dilation. Portal vein is patent on color Doppler imaging with normal direction of blood flow towards the liver. IVC: No abnormality visualized. Pancreas: Visualization of the pancreas is limited due to bowel gas. Spleen: Size and appearance within normal limits. Right Kidney: Length: 13.2 cm. Echogenicity within normal limits. No mass or hydronephrosis visualized. Left Kidney: Length: 11.9 cm. Echogenicity within normal limits. No mass or hydronephrosis visualized. Abdominal aorta: Bowel gas limits evaluation of the abdominal aorta. Other findings: No significant ascites is observed. IMPRESSION: The study is limited overall due to bowel gas and the patient's body habitus. Multiple tiny gallstones are suspected. There is mild prominence of the common bile duct without definite intraluminal stones. Increased hepatic echotexture compatible with fatty infiltrative change. Poor  evaluation of the pancreas due to bowel gas and surrounding inflammatory changes. Electronically Signed   By: David  Swazil[place]Jordan M.D.   On: June 05, 2018 14:02   [place] Renal  Result Date: 11/06/2017 CLINICAL DATA:  Acute kidney injury. EXAM: RENAL / URINARY TRACT ULTRASOUND COMPLETE COMPARISON:  None. FINDINGS: Right Kidney: Length: 12.8 cm. Echogenicity within normal limits. No mass or hydronephrosis visualized. Left Kidney: Length: 11.1 cm. Echogenicity within normal limits. No mass or hydronephrosis visualized. Bladder: Not visualize. Other: Small volume pelvic ascites. IMPRESSION: 1. Normal renal ultrasound. Electronically Signed   By: Elige KoHetal  Patel   On: 11/06/2017 15:15   Mr 3d Recon At Scanner  Result Date: 11/04/2017 CLINICAL DATA:  Pancreatitis with gallstones. EXAM: MRI ABDOMEN WITHOUT AND WITH CONTRAST (INCLUDING MRCP) TECHNIQUE: Multiplanar multisequence MR imaging of the abdomen was performed both before and after the administration of intravenous contrast. Heavily T2-weighted images of the biliary and pancreatic ducts were obtained, and three-dimensional MRCP images were rendered by post processing. CONTRAST:  20 cc MultiHance COMPARISON:  CT scan 11/03/2016 FINDINGS: Markedly motion degraded study. Lower chest: The heart is enlarged without substantial pericardial effusion. Hepatobiliary: Liver measures 20.8 cm and craniocaudal length, enlarged. Multiple gallstones are identified, measuring up to 7 mm diameter. Some stones or trapped behind a fold in the gallbladder fundus. MRCP images are markedly motion degraded and nondiagnostic for tiny common duct stones. Axial T2 weighted single shot imaging shows common duct measuring 7 mm diameter. Common bile duct in the head of the pancreas also measures 7 mm diameter. The axial T2 weighted sequences reveal some central flow artifact in the common duct although a very tiny dependent signal void is seen in the common bile duct on axial image 34 series 5 that maps  to a tiny filling defect in the common bile duct seen on coronal MRCP image 34 of series 6. This appearance is compatible with a tiny common bile duct stone. Pancreas: . pancreas is diffusely edematous with peripancreatic edema tracking diffusely in the anterior pararenal space. Postcontrast imaging shows enhancement in the head of pancreas extreme pancreatic tail but most of the body and tail of pancreas do not enhance. No focal or rim enhancing fluid collection identified in the retroperitoneal space. Spleen:  No splenomegaly. No focal mass lesion. Adrenals/Urinary Tract: No adrenal nodule or mass. Limited assessment of the kidney shows no hydronephrosis or gross mass lesion. Stomach/Bowel: Stomach is nondistended. No gastric wall thickening. No evidence of outlet obstruction. Duodenum is normally positioned as is the ligament of Treitz.  No small bowel or colonic dilatation within the visualized abdomen. Vascular/Lymphatic: Portal vein and superior mesenteric vein are patent. Splenic vein is patent. Celiac axis and SMA are patent. Small retroperitoneal / peripancreatic lymph nodes noted. Other: Free fluid is noted around the liver and spleen. Mesenteric edema/fluid is evident. Musculoskeletal: No abnormal marrow enhancement within the visualized bony anatomy. IMPRESSION: 1. Markedly motion degraded study. 2. Cholelithiasis with borderline distention of the extrahepatic bile ducts. 3 mm filling defect in the common duct as it enters the head of the pancreas is consistent with with choledocholithiasis. 3. Nonenhancement involving much of the pancreatic body and tail parenchyma, highly suggestive of pancreatic necrosis. 4. Extensive edema/inflammation in the retroperitoneal space with intraperitoneal fluid and diffuse mesenteric edema in the abdomen. No focal/organized or rim enhancing fluid collection at this time to suggest evolving pseudocyst or abscess. 5. Hepatomegaly. 6. Portal vein is patent. Electronically  Signed   By: Kennith Center M.D.   On: 11/04/2017 08:12   Dg Chest Port 1 View  Result Date: 11/11/2017 CLINICAL DATA:  Pancreatitis.  Shortness of breath. EXAM: PORTABLE CHEST 1 VIEW COMPARISON:  11/09/2017. FINDINGS: Interim extubation. Interim placement of feeding scratched it interim removal of NG tube placement of feeding tube. Right IJ line in stable position. Cardiomegaly. Low lung volumes. Left lower lobe infiltrate suggesting pneumonia. Asymmetric pulmonary edema could also present this fashion. Small left pleural effusion. No pneumothorax. IMPRESSION: 1. Interim extubation and removal of NG tube. Interim placement of feeding tube, its tip is below left hemidiaphragm. Right IJ line stable position. 2. Low lung volumes with basilar atelectasis. Left lower lobe infiltrate/edema and small left pleural effusion. 3.  Stable cardiomegaly. Electronically Signed   By: Maisie Fus  Register   On: 11/11/2017 06:57   Dg Chest Port 1 View  Result Date: 11/09/2017 CLINICAL DATA:  Hypoxia EXAM: PORTABLE CHEST 1 VIEW COMPARISON:  November 08, 2017 FINDINGS: Endotracheal tube tip is 3.2 cm above the carina. Nasogastric tube tip and side port are below the diaphragm. Central catheter tip is in the superior vena cava. No pneumothorax. There is persistent atelectatic change in the left base with small left pleural effusion. There is mild right base atelectasis. Heart is upper normal in size with pulmonary vascularity within normal limits. No adenopathy. No bone lesions. IMPRESSION: Tube and catheter positions as described without pneumothorax. Persistent bibasilar atelectasis, more on the left than on the right, stable. No new opacity. Small left pleural effusion. Stable cardiac silhouette. Electronically Signed   By: Bretta Bang III M.D.   On: 11/09/2017 07:16   Dg Chest Port 1 View  Result Date: 11/08/2017 CLINICAL DATA:  Hypoxia EXAM: PORTABLE CHEST 1 VIEW COMPARISON:  November 07, 2017 FINDINGS: Endotracheal  tube tip is 3.7 cm above the carina. Nasogastric tube tip and side port are below the diaphragm. Central catheter tip is in the superior vena cava. No pneumothorax. There is atelectatic change in both lung bases, more on the left than on the right, stable. There is a minimal pleural effusion on each side. Heart is mildly enlarged with pulmonary vascularity within normal limits. No adenopathy. No bone lesions. IMPRESSION: Tube and catheter positions as described without evident pneumothorax. Bibasilar atelectasis, more on the left than on the right, with small bilateral pleural effusions. Stable cardiac prominence. Electronically Signed   By: Bretta Bang III M.D.   On: 11/08/2017 07:13   Dg Chest Port 1 View  Result Date: 11/07/2017 CLINICAL DATA:  Central line placement. EXAM: PORTABLE  CHEST 1 VIEW COMPARISON:  0240 hours FINDINGS: Interval placement of right jugular non tunneled hemodialysis catheter with the catheter tip at the level of the mid SVC. No pneumothorax. Endotracheal tube remains present with the tip approximately 3.5 cm above the carina. Gastric decompression tube extends into the stomach. Lungs show improved aeration bilaterally and decrease in pulmonary edema. Probable component of left pleural fluid. IMPRESSION: Non tunneled dialysis catheter tip in SVC. No pneumothorax identified after placement. Lungs show improved aeration with decrease in pulmonary edema. Electronically Signed   By: Irish Lack M.D.   On: 11/07/2017 15:22   Dg Chest Port 1 View  Result Date: 11/07/2017 CLINICAL DATA:  57 y/o  F; post intubation, ET tube position. EXAM: PORTABLE CHEST 1 VIEW COMPARISON:  11/06/2017 chest radiograph FINDINGS: Low lung volumes. Stable enlarged cardiac silhouette given projection and technique. Increased diffuse hazy airspace opacities and small effusions probably representing pulmonary edema. Endotracheal tube is 1.4 cm from carina. No acute osseous abnormality identified.  IMPRESSION: Endotracheal tube 1.4 cm from carina. Increased hazy lung opacities compatible with pulmonary edema and small effusions bilaterally. Electronically Signed   By: Mitzi Hansen M.D.   On: 11/07/2017 04:24   Dg Chest Port 1 View  Result Date: 11/06/2017 CLINICAL DATA:  Acute respiratory failure EXAM: PORTABLE CHEST 1 VIEW COMPARISON:  11/04/2017 FINDINGS: Hypoventilation with decreased lung volume. Increase in bibasilar atelectasis left greater than right. Probable left pleural effusion. Pulmonary vascular congestion suggesting mild heart failure. IMPRESSION: Hypoventilation with increased atelectasis in lung bases left greater than right. Small left effusion. Progression of pulmonary vascular congestion suggesting mild fluid overload. Electronically Signed   By: Marlan Palau M.D.   On: 11/06/2017 09:05   Dg Chest Port 1 View  Result Date: 11/04/2017 CLINICAL DATA:  Shortness of breath, weakness, and fever. EXAM: PORTABLE CHEST 1 VIEW COMPARISON:  Report from prior chest radiograph 10/19/2013 FINDINGS: The patient is rotated to the right on today's radiograph, reducing diagnostic sensitivity and specificity. Mild enlargement of the cardiopericardial silhouette. Prominence of mediastinal contour is probably from fatty mediastinal tissues based on comparison to the recent abdomen MRI. Low lung volumes are present, causing crowding of the pulmonary vasculature. Indistinct airspace opacity at the left lung base compatible with atelectasis or pneumonia. Similar indistinct density at the right lung base medially. Rightward tracheal deviation, partly rotation related IMPRESSION: 1. Left greater than right airspace opacities in the lung bases favoring atelectasis or pneumonia. 2. Low lung volumes are present, causing crowding of the pulmonary vasculature. 3. Mild enlargement of the cardiopericardial silhouette. Electronically Signed   By: Gaylyn Rong M.D.   On: 11/04/2017 14:27   Dg  Chest Port 1v Same Day  Result Date: 11/19/2017 CLINICAL DATA:  Increase shortness of breath EXAM: PORTABLE CHEST 1 VIEW COMPARISON:  11/11/2017 FINDINGS: 0851 hours. Low lung volumes. The cardio pericardial silhouette is enlarged. There is pulmonary vascular congestion without overt pulmonary edema. Persistent left base collapse/consolidation, similar. Right PICC line tip overlies the distal SVC. The visualized bony structures of the thorax are intact. IMPRESSION: No substantial interval change. Cardiomegaly with vascular congestion and left base collapse/consolidation. Electronically Signed   By: Kennith Center M.D.   On: 11/19/2017 10:02   Dg Abd Decub  Result Date: 11/19/2017 CLINICAL DATA:  Ileus, abdominal distention. EXAM: ABDOMEN - 1 VIEW DECUBITUS COMPARISON:  11/13/2017 and CT abdomen pelvis 11/11/2017. FINDINGS: Two left lateral decubitus views of the abdomen show air-fluid levels in dilated bowel. No definite free air.  Abdomen is incompletely imaged. IMPRESSION: Gaseous distention of bowel with air-fluid levels is compatible with the given history of an ileus. Abdomen is incompletely imaged. Electronically Signed   By: Leanna Battles M.D.   On: 11/19/2017 12:02   Dg Abd Portable 1v  Result Date: 11/13/2017 CLINICAL DATA:  NG tube placement. EXAM: PORTABLE ABDOMEN - 1 VIEW COMPARISON:  No recent prior. FINDINGS: NG tube noted with tip coiled stomach. No bowel distention noted. Bibasilar atelectasis. No acute bony abnormality noted. IMPRESSION: NG tube noted with tip coiled in the stomach. Electronically Signed   By: Maisie Fus  Register   On: 11/13/2017 10:07   Dg Abd Portable 1v  Result Date: 11/10/2017 CLINICAL DATA:  Status post feeding tube placement. EXAM: PORTABLE ABDOMEN - 1 VIEW COMPARISON:  Abdominal radiograph of 07 November 2017 FINDINGS: The nasogastric tube has been removed and replaced with a radiodense tipped feeding tube. Thetube tip is in the mid gastric body. IMPRESSION: The tip  of the feeding tube is in the mid gastric body. Electronically Signed   By: David  [place] M.D.   On: 11/10/2017 11:28   Mr Abdomen Mrcp Vivien Rossetti Contast  Result Date: 11/04/2017 CLINICAL DATA:  Pancreatitis with gallstones. EXAM: MRI ABDOMEN WITHOUT AND WITH CONTRAST (INCLUDING MRCP) TECHNIQUE: Multiplanar multisequence MR imaging of the abdomen was performed both before and after the administration of intravenous contrast. Heavily T2-weighted images of the biliary and pancreatic ducts were obtained, and three-dimensional MRCP images were rendered by post processing. CONTRAST:  20 cc MultiHance COMPARISON:  CT scan 11/03/2016 FINDINGS: Markedly motion degraded study. Lower chest: The heart is enlarged without substantial pericardial effusion. Hepatobiliary: Liver measures 20.8 cm and craniocaudal length, enlarged. Multiple gallstones are identified, measuring up to 7 mm diameter. Some stones or trapped behind a fold in the gallbladder fundus. MRCP images are markedly motion degraded and nondiagnostic for tiny common duct stones. Axial T2 weighted single shot imaging shows common duct measuring 7 mm diameter. Common bile duct in the head of the pancreas also measures 7 mm diameter. The axial T2 weighted sequences reveal some central flow artifact in the common duct although a very tiny dependent signal void is seen in the common bile duct on axial image 34 series 5 that maps to a tiny filling defect in the common bile duct seen on coronal MRCP image 34 of series 6. This appearance is compatible with a tiny common bile duct stone. Pancreas: . pancreas is diffusely edematous with peripancreatic edema tracking diffusely in the anterior pararenal space. Postcontrast imaging shows enhancement in the head of pancreas extreme pancreatic tail but most of the body and tail of pancreas do not enhance. No focal or rim enhancing fluid collection identified in the retroperitoneal space. Spleen:  No splenomegaly. No focal mass  lesion. Adrenals/Urinary Tract: No adrenal nodule or mass. Limited assessment of the kidney shows no hydronephrosis or gross mass lesion. Stomach/Bowel: Stomach is nondistended. No gastric wall thickening. No evidence of outlet obstruction. Duodenum is normally positioned as is the ligament of Treitz. No small bowel or colonic dilatation within the visualized abdomen. Vascular/Lymphatic: Portal vein and superior mesenteric vein are patent. Splenic vein is patent. Celiac axis and SMA are patent. Small retroperitoneal / peripancreatic lymph nodes noted. Other: Free fluid is noted around the liver and spleen. Mesenteric edema/fluid is evident. Musculoskeletal: No abnormal marrow enhancement within the visualized bony anatomy. IMPRESSION: 1. Markedly motion degraded study. 2. Cholelithiasis with borderline distention of the extrahepatic bile ducts. 3 mm filling  defect in the common duct as it enters the head of the pancreas is consistent with with choledocholithiasis. 3. Nonenhancement involving much of the pancreatic body and tail parenchyma, highly suggestive of pancreatic necrosis. 4. Extensive edema/inflammation in the retroperitoneal space with intraperitoneal fluid and diffuse mesenteric edema in the abdomen. No focal/organized or rim enhancing fluid collection at this time to suggest evolving pseudocyst or abscess. 5. Hepatomegaly. 6. Portal vein is patent. Electronically Signed   By: Kennith Center M.D.   On: 11/04/2017 08:12   Ct Renal Stone Study  Result Date: 2017/11/15 CLINICAL DATA:  Left-sided flank pain and hematuria.  Vomiting. EXAM: CT ABDOMEN AND PELVIS WITHOUT CONTRAST TECHNIQUE: Multidetector CT imaging of the abdomen and pelvis was performed following the standard protocol without oral or intravenous contrast material administration. COMPARISON:  CT abdomen and pelvis April 09, 2008 FINDINGS: Lower chest: There is bibasilar atelectatic change. Hepatobiliary: There is hepatic steatosis. No focal  liver lesions are evident on this noncontrast enhanced study. Gallbladder wall is not appreciably thickened. There is no biliary duct dilatation. Pancreas: There is edema throughout the pancreas with soft tissue stranding in the adjacent peripancreatic mesentery throughout the peripancreatic region. Fluid tracks to the right of the pancreatic head and inferior to the uncinate process as well as anterior to the pancreatic head. Fluid tracks from the body and tail the pancreas anteriorly to reach the greater curvature of the stomach in the midportion of the gastric body region. A well-defined pseudocyst type lesion is not seen. There is no pancreatic duct dilatation. Spleen: No splenic lesions are evident. Adrenals/Urinary Tract: Adrenals bilaterally appear normal. Kidneys bilaterally show no evident mass or hydronephrosis. There is a 1 mm calculus in the upper pole left kidney, nonobstructing. There is no evident ureteral calculus on either side. Urinary bladder is midline with wall thickness within normal limits. Stomach/Bowel: There is mild wall thickening along the greater curvature of the stomach in the midbody region in along the second and third duodenum due to nearby pancreatitis. No other bowel wall thickening is evident. There is no appreciable bowel obstruction. No evident free air or portal venous air. Vascular/Lymphatic: There is no abdominal aortic aneurysm. No vascular lesions are appreciable on this study. No adenopathy is evident in the abdomen or pelvis. Reproductive: Uterus is anteverted. There is an apparent leiomyoma extending from the leftward aspect of the uterus anteriorly measuring 4.3 x 4.0 cm. No extrauterine pelvic mass evident. Other: Appendix appears normal. No abscess is seen in the abdomen or pelvis. No free pelvic fluid seen. Loculated fluid adjacent to the pancreas is noted. Musculoskeletal: There is degenerative change in the lumbar spine. There are no blastic or lytic bone lesions.  There is no intramuscular or abdominal wall lesion evident. IMPRESSION: 1. Acute pancreatitis with pancreatic edema and peripancreatic fluid, most notably anterior to the body and tail of the pancreas. No well-defined pseudocyst or noncystic mass. No pancreatic duct dilatation. 2. Areas of gastritis and duodenitis due to adjacent pancreatitis causing inflammation in these areas. No bowel obstruction. No abscess. Appendix appears normal. 3. 1 mm nonobstructing calculus upper pole left kidney. No hydronephrosis or ureteral calculus on either side. 4.  Hepatic steatosis. 5. 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 Electronically Signed   By: Bretta Bang III M.D.   On: 11-15-2017 12:04     LOS: 19 days   Jeoffrey Massed, MD  [facility] Pager:[phone]  If  7PM-7AM, please contact night-coverage [email or web address] Password [email or web address] 11/22/2017, 12:37 PM

## 2017-11-23 ENCOUNTER — Inpatient Hospital Stay (HOSPITAL_COMMUNITY): Payer: BLUE CROSS/BLUE SHIELD

## 2017-11-23 LAB — GLUCOSE, CAPILLARY
GLUCOSE-CAPILLARY: 151 mg/dL — AB (ref 65–99)
GLUCOSE-CAPILLARY: 161 mg/dL — AB (ref 65–99)
GLUCOSE-CAPILLARY: 143 mg/dL — AB (ref 65–99)
Glucose-Capillary: 146 mg/dL — ABNORMAL HIGH (ref 65–99)
Glucose-Capillary: 142 mg/dL — ABNORMAL HIGH (ref 65–99)
Glucose-Capillary: 147 mg/dL — ABNORMAL HIGH (ref 65–99)

## 2017-11-23 LAB — PHOSPHORUS: Phosphorus: 4.2 mg/dL (ref 2.5–4.6)

## 2017-11-23 LAB — CBC
HCT: 24.9 % — ABNORMAL LOW (ref 36.0–46.0)
Hemoglobin: 8 g/dL — ABNORMAL LOW (ref 12.0–15.0)
MCH: 29.7 pg (ref 26.0–34.0)
MCHC: 32.1 g/dL (ref 30.0–36.0)
MCV: 92.6 fL (ref 78.0–100.0)
PLATELETS: 398 10*3/uL (ref 150–400)
RBC: 2.69 MIL/uL — AB (ref 3.87–5.11)
RDW: 15.5 % (ref 11.5–15.5)
WBC: 15.4 10*3/uL — ABNORMAL HIGH (ref 4.0–10.5)

## 2017-11-23 LAB — BASIC METABOLIC PANEL
Anion gap: 9 (ref 5–15)
BUN: 64 mg/dL — ABNORMAL HIGH (ref 6–20)
CHLORIDE: 109 mmol/L (ref 101–111)
CO2: 21 mmol/L — AB (ref 22–32)
CREATININE: 1.73 mg/dL — AB (ref 0.44–1.00)
Calcium: 8.2 mg/dL — ABNORMAL LOW (ref 8.9–10.3)
GFR calc non Af Amer: 32 mL/min — ABNORMAL LOW (ref >60)
GFR, EST AFRICAN AMERICAN: 37 mL/min — AB (ref >60)
Glucose, Bld: 162 mg/dL — ABNORMAL HIGH (ref 65–99)
POTASSIUM: 3.7 mmol/L (ref 3.5–5.1)
SODIUM: 139 mmol/L (ref 135–145)

## 2017-11-23 LAB — MAGNESIUM: MAGNESIUM: 1.8 mg/dL (ref 1.7–2.4)

## 2017-11-23 LAB — OSMOLALITY: OSMOLALITY: 309 mosm/kg — AB (ref 275–295)

## 2017-11-23 LAB — BRAIN NATRIURETIC PEPTIDE: B NATRIURETIC PEPTIDE 5: 29.4 pg/mL (ref 0.0–100.0)

## 2017-11-23 LAB — CREATININE, URINE, RANDOM: Creatinine, Urine: 69.33 mg/dL

## 2017-11-23 LAB — SODIUM, URINE, RANDOM: Sodium, Ur: <10 mmol/L

## 2017-11-23 LAB — OSMOLALITY, URINE: OSMOLALITY UR: 450 mosm/kg (ref 300–900)

## 2017-11-23 MED ORDER — HEPARIN SODIUM (PORCINE) 5000 UNIT/ML IJ SOLN
5000.0000 [IU] | Freq: Three times a day (TID) | INTRAMUSCULAR | Status: DC
Start: 2017-11-23 — End: 2017-12-05
  Administered 2017-11-23 – 2017-12-05 (×36): 5000 [IU] via SUBCUTANEOUS
  Filled 2017-11-23 (×34): qty 1

## 2017-11-23 MED ORDER — FAT EMULSION 20 % IV EMUL
240.0000 mL | INTRAVENOUS | Status: AC
Start: 2017-11-23 — End: 2017-11-24
  Administered 2017-11-23: 240 mL via INTRAVENOUS
  Filled 2017-11-23: qty 250

## 2017-11-23 MED ORDER — TRACE MINERALS CR-CU-MN-SE-ZN 10-1000-500-60 MCG/ML IV SOLN
INTRAVENOUS | Status: AC
  Administered 2017-11-23: 18:00:00 via INTRAVENOUS
  Filled 2017-11-23: qty 2000
  Filled 2017-11-23: qty 2640

## 2017-11-23 MED ORDER — SODIUM CHLORIDE 0.9 % IV SOLN
INTRAVENOUS | Status: DC
  Administered 2017-11-23: 16:00:00 via INTRAVENOUS

## 2017-11-23 NOTE — Progress Notes (Signed)
Pt instructed on use of Flutter Valve.  Pt demonstrated with good effort and technique.  RT to monitor and assess as needed.  

## 2017-11-23 NOTE — Progress Notes (Signed)
PHARMACY - ADULT TOTAL PARENTERAL NUTRITION CONSULT NOTE   Pharmacy Consult for TPN Indication: severe pancreatitis  Patient Measurements: Height: _0  (177.8 cm) Weight: (!) 302 lb 11.1 oz (137.3 kg) IBW/kg (Calculated) : 68.5 TPN AdjBW (KG): 85.3 Body mass index is 43.43 kg/m.  Insulin Requirements:  - 14 units Moderate Novolog SSI / 24hrs - 70 units regular insulin in TPN - CL diet likely increases CBGs (meal intake not charted)  Current Nutrition: Back to CL diet 1/30, TPN not yet at goal Boost Breeze BID, each supplement provides 250 kcal and 9 grams of protein.  Pt refused one on 1/31 and refused on on 2/1, but drank both doses scheduled on 2/2.    IVF: 1/2 NS at [facility], asked RN if this can be saline locked   Central access: R IJ HD catheter TPN start date: 1/24  ASSESSMENT                                                                                                          HPI: 57 y.o. Female with gallstone pancreatitis with necrosis s/p MRCP on 1/15.  Repeat LFTs and lipase normalizing.  No plans for surgical intervention at this time; planning for delayed cholecystectomy per [facility].  Tube feeding was started but had to be stopped on 1/22 due to nausea/vomiting.  Pharmacy is consulted to begin TPN. Patient's course has been complicated with respiratory failure and intubation and subsequent extubation on 11/09/2017.  She was also on CRRT from 1/18-1/23.  Significant events:  1/28: CL diet 1/29: FL diet 1/30: did not tolerate FLD, back to CLD, weight incr 305 > 314  2/2 Remains volume overloaded, but respiratory status is improved.  Avoiding lasix prior to contrast CT early next week.  Tolerating volume and OK to continue to advance towards goal rate per MD.  Today: 11/23/2017  Glucose: No hx DM. CBGs elevated since starting TPN but improved with addition of insulin to TPN (CBG 135-163).   Electrolytes: Phos improved to WNL 4.2, withOUT electrolytes in TPN.  CorrCa 9.88 for  Phos-Calcium product of 41.5. Mag and K are WNL.  Renal: AKI on CRRT 1/18-1/23. SCr remains elevated off CRRT, but very slowly decreasing.  Last lasix given 1/30 (I/O net -1867 on 2/2).    LFTs: AST/ALT elevated but improving, Alk phos elevated, Tbili WNL  TGs: 213 (1/15), 332 (1/18), improving 276 (1/28)  Prealbumin: 6.7 (1/25), 9.3 (1/28)  NUTRITIONAL GOALS                                                                                           RD recs 1/31: 2270-2405 kcal, >/= 135 grams protein per day  Clinimix 5/15  at a goal rate of 114m/hr will provide 132g/day protein, 1874Kcal/day. After 7 days, the addition of 20% fat emulsion at 222mhr to provide 132g/day protein, 2354Kcal/day.  NOTE Patient may not be able to tolerate this amount of fluid given current renal function.  PLAN                                                                                                                         At 1800 today:  Advance Clinimix 5/15 (NO electrolytes) to goal rate of 110 ml/hr today per discussion with MD.   Continue 20% lipid emulsion 20 ml/hr over 12 hours.  Monitor closely given elevated triglycerides, pancreatitis, and volume overload.  TPN to contain standard multivitamins and trace elements  Continue insulin in TPN, increase to 90 units regular insulin per 24 hr bag   Continue SSI q4h MODERATE scale  IVF per MD - per discussion with TRH on 2/3, He will add fluids or lasix if needed depending on fluid status pending urine osmolality, Cr, Na labs.  TPN lab panels on Mondays & Thursdays.   Consider daily labs to trend electrolytes without lytes in TPN.  F/u daily.   ChGretta ArabharmD, BCPS Pager [phone]/12/2017 9:01 AM

## 2017-11-23 NOTE — Progress Notes (Signed)
Pt requesting to come off her BIPAP.  Pt states that it makes her anxious and she is unable to calm herself down on the BIPAP.  Pt placed back on 4 LPM [place].  Pt tolerating well at this time, RT to monitor and assess as needed.

## 2017-11-23 NOTE — Progress Notes (Signed)
PROGRESS NOTE        PATIENT DETAILS Name: Emma Washington Age: 57 y.o. Sex: female Date of Birth: 10-Aug-1961 Admit Date: 11/04/17 Admitting Physician Albertine Grates, MD PCP:No primary care provider on file.  Brief Narrative: Patient is a 57 y.o. 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  She briefly required CRRT, subsequently extubated and transferred to try to hospitalist service.  Although improved-hospital course was complicated by respiratory distress secondary to volume overload/pulmonary edema on 1/30 requiring transfer to stepdown unit, worsening abdominal pain requiring initiation of fentanyl PCA on 2/1.  After discussion with general surgery/GI-plans are to proceed with a repeat CT scan of the abdomen sometime next week-hopefully with IV contrast if her renal function allows.  See below for further details  Subjective:  Patient in bed, appears comfortable, denies any headache, no fever, no chest pain or pressure, currently no shortness of breath , improved abdominal pain. No focal weakness.   Assessment/Plan:  Severe necrotizing gallstone pancreatitis: Overall improved-however a few days back was unable to tolerate advancement to full liquids.  She remains on clear liquids and on TNA.  Since she continued to have significant abdominal pain inspite of being on 75 mcg of transdermal fentanyl, and IV fentanyl for breakthrough pain-she was started on IV fentanyl PCA on 2/1 with significant relief of her symptoms.  Plans are to continue to provide supportive care and will repeat a CT scan of the abdomen and pelvis sometime early next week-preferably with IV contrast if her renal function allows.  GI and general surgery following defer further management to them. Currently off IV antibiotics  Acute kidney  injury: Suspect this is hemodynamically mediated in the setting of severe pancreatitis-briefly required CRRT during this hospital stay.,  She appears to be intravascularly slightly dehydrated at this time, urine sodium is less than 10, BMP stable, chest x-ray no acute pulmonary edema, will give her gentle hydration on top of running on 11/23/2017 and monitor renal function.  Acute hypoxic respiratory failure: She was intubated from 1/18-1/20.  She was medically stable on nasal cannula-however on 1/30 she developed worsening hypoxia and respiratory distress thought to be due to volume overload with some components from anxiety and pain.  Diuresis with Lasix and BiPAP shortness of breath has improved on 01/07/2018, BMP stable, chest x-ray shows no pulmonary edema, hold further diuresis, continue BiPAP as needed and monitor .  Anemia of critical illness: Hemoglobin currently stable-no indication for transfusion at this time  Morbid obesity  Suspected OHS/OSA: Outpatient sleep study-BiPAP nightly if she tolerates.    DVT Prophylaxis: Prophylactic Heparin  Code Status: Full code  Family Communication: None at bedside  Disposition Plan: Remain inpatient-remain in stepdown .  Antimicrobial agents: Anti-infectives (From admission, onward)   Start     Dose/Rate Route Frequency Ordered Stop   11/12/17 1800  meropenem (MERREM) 1 g in sodium chloride 0.9 % 100 mL IVPB  Status:  Discontinued     1 g 200 mL/hr over 30 Minutes Intravenous Every 8 hours 11/12/17 1115 11/17/17 0954   11/11/17 1000  meropenem (MERREM) 1 g in sodium chloride 0.9 % 100 mL IVPB  Status:  Discontinued     1 g 200 mL/hr over 30 Minutes Intravenous Every 12 hours  11/11/17 0911 11/12/17 1115   11/09/17 1000  piperacillin-tazobactam (ZOSYN) IVPB 3.375 g  Status:  Discontinued     3.375 g 12.5 mL/hr over 240 Minutes Intravenous Every 8 hours 11/09/17 0614 11/09/17 0615   11/09/17 1000  piperacillin-tazobactam (ZOSYN) IVPB  3.375 g  Status:  Discontinued     3.375 g 100 mL/hr over 30 Minutes Intravenous Every 6 hours 11/09/17 0616 11/11/17 0911   11/07/17 2200  piperacillin-tazobactam (ZOSYN) IVPB 2.25 g  Status:  Discontinued     2.25 g 100 mL/hr over 30 Minutes Intravenous Every 8 hours 11/07/17 1218 11/07/17 1741   11/07/17 2200  piperacillin-tazobactam (ZOSYN) IVPB 3.375 g  Status:  Discontinued     3.375 g 12.5 mL/hr over 240 Minutes Intravenous Every 6 hours 11/07/17 1741 11/07/17 1747   11/07/17 2200  piperacillin-tazobactam (ZOSYN) IVPB 3.375 g  Status:  Discontinued     3.375 g 100 mL/hr over 30 Minutes Intravenous Every 6 hours 11/07/17 1747 11/09/17 0613   11/04/17 1100  piperacillin-tazobactam (ZOSYN) IVPB 3.375 g  Status:  Discontinued     3.375 g 12.5 mL/hr over 240 Minutes Intravenous Every 8 hours 11/04/17 1059 11/07/17 1218   11/08/2017 1200  piperacillin-tazobactam (ZOSYN) IVPB 3.375 g     3.375 g 100 mL/hr over 30 Minutes Intravenous  Once 11/04/2017 1157 11/02/2017 1309      Procedures: ETT 1/18 >> 1/20  CONSULTS:  pulmonary/intensive care, GI and general surgery  Time spent: 25 minutes -Greater than 50% of this time was spent in counseling, explanation of diagnosis, planning of further management, and coordination of care.  MEDICATIONS: Scheduled Meds: . chlorhexidine  15 mL Mouth Rinse BID  . Chlorhexidine Gluconate Cloth  6 each Topical Daily  . feeding supplement  1 Container Oral BID BM  . fentaNYL   Intravenous Q4H  . heparin injection (subcutaneous)  5,000 Units Subcutaneous Q8H  . insulin aspart  0-15 Units Subcutaneous Q4H  . mouth rinse  15 mL Mouth Rinse q12n4p  . pantoprazole (PROTONIX) IV  40 mg Intravenous Q12H  . sodium chloride flush  10-40 mL Intracatheter Q12H   Continuous Infusions: . sodium chloride    . TPN (CLINIMIX) Adult without lytes     And  . fat emulsion    . TPN (CLINIMIX) Adult without lytes 83 mL/hr at 11/22/17 1757   PRN Meds:.acetaminophen,  albuterol, ALPRAZolam, diphenhydrAMINE **OR** diphenhydrAMINE, lip balm, naLOXone (NARCAN)  injection, naloxone **AND** sodium chloride flush, ondansetron (ZOFRAN) IV, sodium chloride flush, sodium chloride flush   PHYSICAL EXAM: Vital signs: Vitals:   11/23/17 0500 11/23/17 0708 11/23/17 0911 11/23/17 1200  BP: 134/69     Pulse: (!) 104     Resp: (!) 32  (!) 26   Temp:  97.7 F (36.5 C)  (!) 97.2 F (36.2 C)  TempSrc:  Axillary  Oral  SpO2: 96%  95%   Weight:      Height:       Filed Weights   11/21/17 0600 11/22/17 0500 11/23/17 0422  Weight: (!) 139.1 kg (306 lb 10.6 oz) (!) 139.4 kg (307 lb 5.1 oz) (!) 137.3 kg (302 lb 11.1 oz)   Body mass index is 43.43 kg/m.   Exam   Awake Alert, Oriented X 3, No new F.N deficits, Normal affect [place].AT,PERRAL Supple Neck,No JVD, No cervical lymphadenopathy appriciated.  Symmetrical Chest wall movement, Good air movement bilaterally, CTAB RRR,No Gallops, Rubs or new Murmurs, No Parasternal Heave +ve B.Sounds, Abd mildly distended, No  tenderness, No organomegaly appriciated, No rebound - guarding or rigidity. No Cyanosis, Clubbing or edema, No new Rash or bruise   General appearance :Awake, alert, not in any distress.  Eyes:, pupils equally reactive to light and accomodation,no scleral icterus. HEENT: Atraumatic and Normocephalic Neck: supple, no JVD. Resp:Good air entry bilaterally, clear to auscultation anteriorly CVS: S1 S2 regular, no murmurs.  GI: Bowel sounds present, mildly tender in the upper abdomen.  Abdomen is distended/obese.  No peritoneal signs. Extremities: B/L Lower Ext shows + edema, both legs are warm to touch Neurology:  speech clear,Non focal, sensation is grossly intact. Psychiatric: Normal judgment and insight. Normal mood. Musculoskeletal:No digital cyanosis Skin:No Rash, warm and dry Wounds:N/A  I have personally reviewed following labs and imaging studies  LABORATORY DATA: CBC: Recent Labs  Lab  11/17/17 0452 11/19/17 0402 11/23/17 0400  WBC 20.1* 18.7* 15.4*  NEUTROABS 17.9*  --   --   HGB 8.2* 8.1* 8.0*  HCT 25.5* 25.5* 24.9*  MCV 95.1 94.1 92.6  PLT 559* 470* 398    Basic Metabolic Panel: Recent Labs  Lab 11/19/17 0402 11/20/17 0448 11/21/17 0554 11/22/17 0827 11/23/17 0400  NA 143 140 139 138 139  K 3.7 3.9 3.7 3.9 3.7  CL 114* 114* 110 108 109  CO2 22 20* 21* 21* 21*  GLUCOSE 162* 180* 146* 148* 162*  BUN 50* 59* 61* 60* 64*  CREATININE 1.60* 1.97* 1.82* 1.78* 1.73*  CALCIUM 7.7* 7.9* 8.0* 8.1* 8.2*  MG 1.8 1.8 1.6* 1.9 1.8  PHOS 4.5 5.3* 4.5 4.7* 4.2    GFR: Estimated Creatinine Clearance: 55.7 mL/min (A) (by C-G formula based on SCr of 1.73 mg/dL (H)).  Liver Function Tests: Recent Labs  Lab 11/17/17 0452 11/19/17 0402 11/20/17 0448  AST 92* 83* 38  ALT 72* 89* 58*  ALKPHOS 129* 182* 160*  BILITOT 0.5 0.6 0.4  PROT 5.8* 5.8* 5.9*  ALBUMIN 1.9* 1.8* 1.9*   No results for input(s): LIPASE, AMYLASE in the last 168 hours. No results for input(s): AMMONIA in the last 168 hours.  Coagulation Profile: No results for input(s): INR, PROTIME in the last 168 hours.  Cardiac Enzymes: No results for input(s): CKTOTAL, CKMB, CKMBINDEX, TROPONINI in the last 168 hours.  BNP (last 3 results) No results for input(s): PROBNP in the last 8760 hours.  HbA1C: No results for input(s): HGBA1C in the last 72 hours.  CBG: Recent Labs  Lab 11/22/17 1953 11/22/17 2329 11/23/17 0318 11/23/17 0755 11/23/17 1128  GLUCAP 143* 144* 146* 151* 142*    Lipid Profile: No results for input(s): CHOL, HDL, LDLCALC, TRIG, CHOLHDL, LDLDIRECT in the last 72 hours.  Thyroid Function Tests: No results for input(s): TSH, T4TOTAL, FREET4, T3FREE, THYROIDAB in the last 72 hours.  Anemia Panel: No results for input(s): VITAMINB12, FOLATE, FERRITIN, TIBC, IRON, RETICCTPCT in the last 72 hours.  Urine analysis:    Component Value Date/Time   COLORURINE AMBER (A)  11/06/2017 1245   APPEARANCEUR CLOUDY (A) 11/06/2017 1245   LABSPEC 1.018 11/06/2017 1245   PHURINE 5.0 11/06/2017 1245   GLUCOSEU 50 (A) 11/06/2017 1245   HGBUR LARGE (A) 11/06/2017 1245   BILIRUBINUR NEGATIVE 11/06/2017 1245   KETONESUR NEGATIVE 11/06/2017 1245   PROTEINUR 30 (A) 11/06/2017 1245   NITRITE NEGATIVE 11/06/2017 1245   LEUKOCYTESUR NEGATIVE 11/06/2017 1245    Sepsis Labs: Lactic Acid, Venous No results found for: LATICACIDVEN  MICROBIOLOGY: No results found for this or any previous visit (from the past  240 hour(s)).  RADIOLOGY STUDIES/RESULTS: Ct Abdomen Pelvis Wo Contrast  Result Date: 11/11/2017 CLINICAL DATA:  57 year old female with a history of necrotizing pancreatitis now with worsening leukocytosis. Evaluate for evidence of superinfection. EXAM: CT ABDOMEN AND PELVIS WITHOUT CONTRAST TECHNIQUE: Multidetector CT imaging of the abdomen and pelvis was performed following the standard protocol without IV contrast. COMPARISON:  Prior CT scan of the abdomen and pelvis Nov 13, 2017 FINDINGS: Lower chest: Small bilateral pleural effusions and associated lower lobe atelectasis. The intracardiac blood pool is hypodense relative to the adjacent myocardium consistent with anemia. No pericardial effusion. Unremarkable distal thoracic esophagus. Hepatobiliary: Severe hypoattenuation of the hepatic parenchyma consistent with hepatic steatosis. There is some sparing around the gallbladder fossa. High attenuation material within the gallbladder consistent with sludge and/or small stones. No intra or extrahepatic biliary ductal dilatation. Pancreas: The pancreas is diffusely edematous. No discrete mass or pseudocyst. Inflammatory stranding extends throughout the peripancreatic soft tissues and into the mesenteric root. The degree of peripancreatic inflammatory stranding has significantly progressed compared to 2017-11-13. Evaluation is limited in the absence of intravenous contrast. Spleen:  Normal in size without focal abnormality. Adrenals/Urinary Tract: Adrenal glands are unremarkable. Kidneys are normal, without renal calculi, focal lesion, or hydronephrosis. Foley catheter in the bladder. Stomach/Bowel: No evidence of obstruction or focal bowel wall thickening. Normal appendix in the right lower quadrant. The terminal ileum is unremarkable. Vascular/Lymphatic: Limited evaluation in the absence of intravenous contrast. No aneurysm, significant atherosclerotic calcifications or suspicious lymphadenopathy. Reproductive: Uterus and bilateral adnexa are unremarkable. Other: Progressive inflammatory stranding throughout the mesenteric root and peripancreatic soft tissues. Small volume ascites along the greater curvature of the stomach, in the left perisplenic space and extending inferiorly along the left greater than right colic gutters to layer in the anatomic pelvis. No loculation or intra fluid gas to suggest infection. Musculoskeletal: No acute fracture or aggressive appearing lytic or blastic osseous lesion. IMPRESSION: 1. Progressive severe pancreatitis and secondary inflammatory changes throughout the peripancreatic soft tissues and mesenteric root. There is now small volume ascites which is likely reactive in nature. No loculation or intra fluid gas to suggest infection. 2. Small bilateral pleural effusions and associated atelectasis. 3. The intracardiac blood pool is hypodense relative to the adjacent myocardium consistent with anemia. 4. At least moderate hepatic steatosis. 5. Cholelithiasis. Electronically Signed   By: Malachy Moan M.D.   On: 11/11/2017 14:57   Dg Abd 1 View  Result Date: 11/10/2017 CLINICAL DATA:  NG tube placement Best obtainable notes due to patients size, pain and altered mental status. I could not get her to stop rocking and talking EXAM: ABDOMEN - 1 VIEW COMPARISON:  Earlier film of the same day FINDINGS: Weighted enteric tube tip is in the gastric body as  before. Normal bowel gas pattern. The lower abdomen is is excluded. IMPRESSION: 1. Stable placement of feeding tube tip in the gastric body. Electronically Signed   By: Corlis Leak M.D.   On: 11/10/2017 19:29   Dg Abd 1 View  Result Date: 11/07/2017 CLINICAL DATA:  Orogastric tube placement. EXAM: ABDOMEN - 1 VIEW COMPARISON:  None. FINDINGS: Orogastric tube extends into the stomach. Bowel gas pattern shows probable ileus. IMPRESSION: Orogastric tube extends into the stomach. Electronically Signed   By: Irish Lack M.D.   On: 11/07/2017 15:23   US Abdomen Complete  Result Date: 11-13-17 CLINICAL DATA:  Epigastric and right upper quadrant discomfort, syncope, vomiting, hematuria. EXAM: ABDOMEN ULTRASOUND COMPLETE COMPARISON:  Abdominal and pelvic CT scan of  today's date FINDINGS: Gallbladder: Probable small stones. No gallbladder wall thickening or pericholecystic fluid or positive sonographic Murphy's sign. Common bile duct: Diameter: 6.7 mm Liver: Visualization of the hepatic parenchyma is limited by bowel poor penetration of the ultrasound beam. The echotexture is subjectively increased. There is no focal mass or ductal dilation. Portal vein is patent on color Doppler imaging with normal direction of blood flow towards the liver. IVC: No abnormality visualized. Pancreas: Visualization of the pancreas is limited due to bowel gas. Spleen: Size and appearance within normal limits. Right Kidney: Length: 13.2 cm. Echogenicity within normal limits. No mass or hydronephrosis visualized. Left Kidney: Length: 11.9 cm. Echogenicity within normal limits. No mass or hydronephrosis visualized. Abdominal aorta: Bowel gas limits evaluation of the abdominal aorta. Other findings: No significant ascites is observed. IMPRESSION: The study is limited overall due to bowel gas and the patient's body habitus. Multiple tiny gallstones are suspected. There is mild prominence of the common bile duct without definite  intraluminal stones. Increased hepatic echotexture compatible with fatty infiltrative change. Poor evaluation of the pancreas due to bowel gas and surrounding inflammatory changes. Electronically Signed   By: David  [place] M.D.   On: 11/14/2017 14:02   US Renal  Result Date: 11/06/2017 CLINICAL DATA:  Acute kidney injury. EXAM: RENAL / URINARY TRACT ULTRASOUND COMPLETE COMPARISON:  None. FINDINGS: Right Kidney: Length: 12.8 cm. Echogenicity within normal limits. No mass or hydronephrosis visualized. Left Kidney: Length: 11.1 cm. Echogenicity within normal limits. No mass or hydronephrosis visualized. Bladder: Not visualize. Other: Small volume pelvic ascites. IMPRESSION: 1. Normal renal ultrasound. Electronically Signed   By: Elige Ko   On: 11/06/2017 15:15   Mr 3d Recon At Scanner  Result Date: 11/04/2017 CLINICAL DATA:  Pancreatitis with gallstones. EXAM: MRI ABDOMEN WITHOUT AND WITH CONTRAST (INCLUDING MRCP) TECHNIQUE: Multiplanar multisequence MR imaging of the abdomen was performed both before and after the administration of intravenous contrast. Heavily T2-weighted images of the biliary and pancreatic ducts were obtained, and three-dimensional MRCP images were rendered by post processing. CONTRAST:  20 cc MultiHance COMPARISON:  CT scan 11/03/2016 FINDINGS: Markedly motion degraded study. Lower chest: The heart is enlarged without substantial pericardial effusion. Hepatobiliary: Liver measures 20.8 cm and craniocaudal length, enlarged. Multiple gallstones are identified, measuring up to 7 mm diameter. Some stones or trapped behind a fold in the gallbladder fundus. MRCP images are markedly motion degraded and nondiagnostic for tiny common duct stones. Axial T2 weighted single shot imaging shows common duct measuring 7 mm diameter. Common bile duct in the head of the pancreas also measures 7 mm diameter. The axial T2 weighted sequences reveal some central flow artifact in the common duct although a  very tiny dependent signal void is seen in the common bile duct on axial image 34 series 5 that maps to a tiny filling defect in the common bile duct seen on coronal MRCP image 34 of series 6. This appearance is compatible with a tiny common bile duct stone. Pancreas: . pancreas is diffusely edematous with peripancreatic edema tracking diffusely in the anterior pararenal space. Postcontrast imaging shows enhancement in the head of pancreas extreme pancreatic tail but most of the body and tail of pancreas do not enhance. No focal or rim enhancing fluid collection identified in the retroperitoneal space. Spleen:  No splenomegaly. No focal mass lesion. Adrenals/Urinary Tract: No adrenal nodule or mass. Limited assessment of the kidney shows no hydronephrosis or gross mass lesion. Stomach/Bowel: Stomach is nondistended. No gastric wall thickening.  No evidence of outlet obstruction. Duodenum is normally positioned as is the ligament of Treitz. No small bowel or colonic dilatation within the visualized abdomen. Vascular/Lymphatic: Portal vein and superior mesenteric vein are patent. Splenic vein is patent. Celiac axis and SMA are patent. Small retroperitoneal / peripancreatic lymph nodes noted. Other: Free fluid is noted around the liver and spleen. Mesenteric edema/fluid is evident. Musculoskeletal: No abnormal marrow enhancement within the visualized bony anatomy. IMPRESSION: 1. Markedly motion degraded study. 2. Cholelithiasis with borderline distention of the extrahepatic bile ducts. 3 mm filling defect in the common duct as it enters the head of the pancreas is consistent with with choledocholithiasis. 3. Nonenhancement involving much of the pancreatic body and tail parenchyma, highly suggestive of pancreatic necrosis. 4. Extensive edema/inflammation in the retroperitoneal space with intraperitoneal fluid and diffuse mesenteric edema in the abdomen. No focal/organized or rim enhancing fluid collection at this time to  suggest evolving pseudocyst or abscess. 5. Hepatomegaly. 6. Portal vein is patent. Electronically Signed   By: Kennith Center M.D.   On: 11/04/2017 08:12   Dg Chest Port 1 View  Result Date: 11/23/2017 CLINICAL DATA:  Shortness of breath EXAM: PORTABLE CHEST 1 VIEW COMPARISON:  11/19/2017 FINDINGS: Left lower lobe opacity, unchanged, likely reflecting a combination of atelectasis and pleural effusion. Right lung is essentially clear. Mild pulmonary vascular congestion. No frank interstitial edema. No pneumothorax. Cardiomegaly. Right subclavian catheter terminates at the cavoatrial junction. IMPRESSION: Left lower lobe opacity, unchanged, likely reflecting combination of atelectasis and pleural effusion. Pulmonary vascular congestion without frank interstitial edema. Electronically Signed   By: Charline Bills M.D.   On: 11/23/2017 09:04   Dg Chest Port 1 View  Result Date: 11/11/2017 CLINICAL DATA:  Pancreatitis.  Shortness of breath. EXAM: PORTABLE CHEST 1 VIEW COMPARISON:  11/09/2017. FINDINGS: Interim extubation. Interim placement of feeding scratched it interim removal of NG tube placement of feeding tube. Right IJ line in stable position. Cardiomegaly. Low lung volumes. Left lower lobe infiltrate suggesting pneumonia. Asymmetric pulmonary edema could also present this fashion. Small left pleural effusion. No pneumothorax. IMPRESSION: 1. Interim extubation and removal of NG tube. Interim placement of feeding tube, its tip is below left hemidiaphragm. Right IJ line stable position. 2. Low lung volumes with basilar atelectasis. Left lower lobe infiltrate/edema and small left pleural effusion. 3.  Stable cardiomegaly. Electronically Signed   By: Maisie Fus  Register   On: 11/11/2017 06:57   Dg Chest Port 1 View  Result Date: 11/09/2017 CLINICAL DATA:  Hypoxia EXAM: PORTABLE CHEST 1 VIEW COMPARISON:  November 08, 2017 FINDINGS: Endotracheal tube tip is 3.2 cm above the carina. Nasogastric tube tip and side  port are below the diaphragm. Central catheter tip is in the superior vena cava. No pneumothorax. There is persistent atelectatic change in the left base with small left pleural effusion. There is mild right base atelectasis. Heart is upper normal in size with pulmonary vascularity within normal limits. No adenopathy. No bone lesions. IMPRESSION: Tube and catheter positions as described without pneumothorax. Persistent bibasilar atelectasis, more on the left than on the right, stable. No new opacity. Small left pleural effusion. Stable cardiac silhouette. Electronically Signed   By: Bretta Bang III M.D.   On: 11/09/2017 07:16   Dg Chest Port 1 View  Result Date: 11/08/2017 CLINICAL DATA:  Hypoxia EXAM: PORTABLE CHEST 1 VIEW COMPARISON:  November 07, 2017 FINDINGS: Endotracheal tube tip is 3.7 cm above the carina. Nasogastric tube tip and side port are below the diaphragm.  Central catheter tip is in the superior vena cava. No pneumothorax. There is atelectatic change in both lung bases, more on the left than on the right, stable. There is a minimal pleural effusion on each side. Heart is mildly enlarged with pulmonary vascularity within normal limits. No adenopathy. No bone lesions. IMPRESSION: Tube and catheter positions as described without evident pneumothorax. Bibasilar atelectasis, more on the left than on the right, with small bilateral pleural effusions. Stable cardiac prominence. Electronically Signed   By: Bretta Bang III M.D.   On: 11/08/2017 07:13   Dg Chest Port 1 View  Result Date: 11/07/2017 CLINICAL DATA:  Central line placement. EXAM: PORTABLE CHEST 1 VIEW COMPARISON:  0240 hours FINDINGS: Interval placement of right jugular non tunneled hemodialysis catheter with the catheter tip at the level of the mid SVC. No pneumothorax. Endotracheal tube remains present with the tip approximately 3.5 cm above the carina. Gastric decompression tube extends into the stomach. Lungs show improved  aeration bilaterally and decrease in pulmonary edema. Probable component of left pleural fluid. IMPRESSION: Non tunneled dialysis catheter tip in SVC. No pneumothorax identified after placement. Lungs show improved aeration with decrease in pulmonary edema. Electronically Signed   By: Irish Lack M.D.   On: 11/07/2017 15:22   Dg Chest Port 1 View  Result Date: 11/07/2017 CLINICAL DATA:  57 y/o  F; post intubation, ET tube position. EXAM: PORTABLE CHEST 1 VIEW COMPARISON:  11/06/2017 chest radiograph FINDINGS: Low lung volumes. Stable enlarged cardiac silhouette given projection and technique. Increased diffuse hazy airspace opacities and small effusions probably representing pulmonary edema. Endotracheal tube is 1.4 cm from carina. No acute osseous abnormality identified. IMPRESSION: Endotracheal tube 1.4 cm from carina. Increased hazy lung opacities compatible with pulmonary edema and small effusions bilaterally. Electronically Signed   By: Mitzi Hansen M.D.   On: 11/07/2017 04:24   Dg Chest Port 1 View  Result Date: 11/06/2017 CLINICAL DATA:  Acute respiratory failure EXAM: PORTABLE CHEST 1 VIEW COMPARISON:  11/04/2017 FINDINGS: Hypoventilation with decreased lung volume. Increase in bibasilar atelectasis left greater than right. Probable left pleural effusion. Pulmonary vascular congestion suggesting mild heart failure. IMPRESSION: Hypoventilation with increased atelectasis in lung bases left greater than right. Small left effusion. Progression of pulmonary vascular congestion suggesting mild fluid overload. Electronically Signed   By: Marlan Palau M.D.   On: 11/06/2017 09:05   Dg Chest Port 1 View  Result Date: 11/04/2017 CLINICAL DATA:  Shortness of breath, weakness, and fever. EXAM: PORTABLE CHEST 1 VIEW COMPARISON:  Report from prior chest radiograph 10/19/2013 FINDINGS: The patient is rotated to the right on today's radiograph, reducing diagnostic sensitivity and specificity.  Mild enlargement of the cardiopericardial silhouette. Prominence of mediastinal contour is probably from fatty mediastinal tissues based on comparison to the recent abdomen MRI. Low lung volumes are present, causing crowding of the pulmonary vasculature. Indistinct airspace opacity at the left lung base compatible with atelectasis or pneumonia. Similar indistinct density at the right lung base medially. Rightward tracheal deviation, partly rotation related IMPRESSION: 1. Left greater than right airspace opacities in the lung bases favoring atelectasis or pneumonia. 2. Low lung volumes are present, causing crowding of the pulmonary vasculature. 3. Mild enlargement of the cardiopericardial silhouette. Electronically Signed   By: Gaylyn Rong M.D.   On: 11/04/2017 14:27   Dg Chest Port 1v Same Day  Result Date: 11/19/2017 CLINICAL DATA:  Increase shortness of breath EXAM: PORTABLE CHEST 1 VIEW COMPARISON:  11/11/2017 FINDINGS: 0851 hours. Low  lung volumes. The cardio pericardial silhouette is enlarged. There is pulmonary vascular congestion without overt pulmonary edema. Persistent left base collapse/consolidation, similar. Right PICC line tip overlies the distal SVC. The visualized bony structures of the thorax are intact. IMPRESSION: No substantial interval change. Cardiomegaly with vascular congestion and left base collapse/consolidation. Electronically Signed   By: Kennith Center M.D.   On: 11/19/2017 10:02   Dg Abd Decub  Result Date: 11/19/2017 CLINICAL DATA:  Ileus, abdominal distention. EXAM: ABDOMEN - 1 VIEW DECUBITUS COMPARISON:  11/13/2017 and CT abdomen pelvis 11/11/2017. FINDINGS: Two left lateral decubitus views of the abdomen show air-fluid levels in dilated bowel. No definite free air. Abdomen is incompletely imaged. IMPRESSION: Gaseous distention of bowel with air-fluid levels is compatible with the given history of an ileus. Abdomen is incompletely imaged. Electronically Signed   By:  Leanna Battles M.D.   On: 11/19/2017 12:02   Dg Abd Portable 1v  Result Date: 11/13/2017 CLINICAL DATA:  NG tube placement. EXAM: PORTABLE ABDOMEN - 1 VIEW COMPARISON:  No recent prior. FINDINGS: NG tube noted with tip coiled stomach. No bowel distention noted. Bibasilar atelectasis. No acute bony abnormality noted. IMPRESSION: NG tube noted with tip coiled in the stomach. Electronically Signed   By: Maisie Fus  Register   On: 11/13/2017 10:07   Dg Abd Portable 1v  Result Date: 11/10/2017 CLINICAL DATA:  Status post feeding tube placement. EXAM: PORTABLE ABDOMEN - 1 VIEW COMPARISON:  Abdominal radiograph of 07 November 2017 FINDINGS: The nasogastric tube has been removed and replaced with a radiodense tipped feeding tube. Thetube tip is in the mid gastric body. IMPRESSION: The tip of the feeding tube is in the mid gastric body. Electronically Signed   By: David  [place] M.D.   On: 11/10/2017 11:28   Mr Abdomen Mrcp Vivien Rossetti Contast  Result Date: 11/04/2017 CLINICAL DATA:  Pancreatitis with gallstones. EXAM: MRI ABDOMEN WITHOUT AND WITH CONTRAST (INCLUDING MRCP) TECHNIQUE: Multiplanar multisequence MR imaging of the abdomen was performed both before and after the administration of intravenous contrast. Heavily T2-weighted images of the biliary and pancreatic ducts were obtained, and three-dimensional MRCP images were rendered by post processing. CONTRAST:  20 cc MultiHance COMPARISON:  CT scan 11/03/2016 FINDINGS: Markedly motion degraded study. Lower chest: The heart is enlarged without substantial pericardial effusion. Hepatobiliary: Liver measures 20.8 cm and craniocaudal length, enlarged. Multiple gallstones are identified, measuring up to 7 mm diameter. Some stones or trapped behind a fold in the gallbladder fundus. MRCP images are markedly motion degraded and nondiagnostic for tiny common duct stones. Axial T2 weighted single shot imaging shows common duct measuring 7 mm diameter. Common bile duct in the  head of the pancreas also measures 7 mm diameter. The axial T2 weighted sequences reveal some central flow artifact in the common duct although a very tiny dependent signal void is seen in the common bile duct on axial image 34 series 5 that maps to a tiny filling defect in the common bile duct seen on coronal MRCP image 34 of series 6. This appearance is compatible with a tiny common bile duct stone. Pancreas: . pancreas is diffusely edematous with peripancreatic edema tracking diffusely in the anterior pararenal space. Postcontrast imaging shows enhancement in the head of pancreas extreme pancreatic tail but most of the body and tail of pancreas do not enhance. No focal or rim enhancing fluid collection identified in the retroperitoneal space. Spleen:  No splenomegaly. No focal mass lesion. Adrenals/Urinary Tract: No adrenal nodule or mass.  Limited assessment of the kidney shows no hydronephrosis or gross mass lesion. Stomach/Bowel: Stomach is nondistended. No gastric wall thickening. No evidence of outlet obstruction. Duodenum is normally positioned as is the ligament of Treitz. No small bowel or colonic dilatation within the visualized abdomen. Vascular/Lymphatic: Portal vein and superior mesenteric vein are patent. Splenic vein is patent. Celiac axis and SMA are patent. Small retroperitoneal / peripancreatic lymph nodes noted. Other: Free fluid is noted around the liver and spleen. Mesenteric edema/fluid is evident. Musculoskeletal: No abnormal marrow enhancement within the visualized bony anatomy. IMPRESSION: 1. Markedly motion degraded study. 2. Cholelithiasis with borderline distention of the extrahepatic bile ducts. 3 mm filling defect in the common duct as it enters the head of the pancreas is consistent with with choledocholithiasis. 3. Nonenhancement involving much of the pancreatic body and tail parenchyma, highly suggestive of pancreatic necrosis. 4. Extensive edema/inflammation in the retroperitoneal  space with intraperitoneal fluid and diffuse mesenteric edema in the abdomen. No focal/organized or rim enhancing fluid collection at this time to suggest evolving pseudocyst or abscess. 5. Hepatomegaly. 6. Portal vein is patent. Electronically Signed   By: Kennith CenterEric  Mansell M.D.   On: 11/04/2017 08:12   Ct Renal Stone Study  Result Date: 10/26/2017 CLINICAL DATA:  Left-sided flank pain and hematuria.  Vomiting. EXAM: CT ABDOMEN AND PELVIS WITHOUT CONTRAST TECHNIQUE: Multidetector CT imaging of the abdomen and pelvis was performed following the standard protocol without oral or intravenous contrast material administration. COMPARISON:  CT abdomen and pelvis April 09, 2008 FINDINGS: Lower chest: There is bibasilar atelectatic change. Hepatobiliary: There is hepatic steatosis. No focal liver lesions are evident on this noncontrast enhanced study. Gallbladder wall is not appreciably thickened. There is no biliary duct dilatation. Pancreas: There is edema throughout the pancreas with soft tissue stranding in the adjacent peripancreatic mesentery throughout the peripancreatic region. Fluid tracks to the right of the pancreatic head and inferior to the uncinate process as well as anterior to the pancreatic head. Fluid tracks from the body and tail the pancreas anteriorly to reach the greater curvature of the stomach in the midportion of the gastric body region. A well-defined pseudocyst type lesion is not seen. There is no pancreatic duct dilatation. Spleen: No splenic lesions are evident. Adrenals/Urinary Tract: Adrenals bilaterally appear normal. Kidneys bilaterally show no evident mass or hydronephrosis. There is a 1 mm calculus in the upper pole left kidney, nonobstructing. There is no evident ureteral calculus on either side. Urinary bladder is midline with wall thickness within normal limits. Stomach/Bowel: There is mild wall thickening along the greater curvature of the stomach in the midbody region in along the  second and third duodenum due to nearby pancreatitis. No other bowel wall thickening is evident. There is no appreciable bowel obstruction. No evident free air or portal venous air. Vascular/Lymphatic: There is no abdominal aortic aneurysm. No vascular lesions are appreciable on this study. No adenopathy is evident in the abdomen or pelvis. Reproductive: Uterus is anteverted. There is an apparent leiomyoma extending from the leftward aspect of the uterus anteriorly measuring 4.3 x 4.0 cm. No extrauterine pelvic mass evident. Other: Appendix appears normal. No abscess is seen in the abdomen or pelvis. No free pelvic fluid seen. Loculated fluid adjacent to the pancreas is noted. Musculoskeletal: There is degenerative change in the lumbar spine. There are no blastic or lytic bone lesions. There is no intramuscular or abdominal wall lesion evident. IMPRESSION: 1. Acute pancreatitis with pancreatic edema and peripancreatic fluid, most notably anterior to  the body and tail of the pancreas. No well-defined pseudocyst or noncystic mass. No pancreatic duct dilatation. 2. Areas of gastritis and duodenitis due to adjacent pancreatitis causing inflammation in these areas. No bowel obstruction. No abscess. Appendix appears normal. 3. 1 mm nonobstructing calculus upper pole left kidney. No hydronephrosis or ureteral calculus on either side. 4.  Hepatic steatosis. 5. 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 Electronically Signed   By: Bretta Bang III M.D.   On: 11/06/17 12:04     LOS: 20 days   Signature  Susa Raring M.D on 11/23/2017 at 1:14 PM  Between 7am to 7pm - Pager - [phone] ( page via [email or web address], text pages only, please mention full 10 digit call back number).  After 7pm go to [email or web address] - password [email or web address]

## 2017-11-24 ENCOUNTER — Encounter (HOSPITAL_COMMUNITY): Payer: Self-pay | Admitting: Surgery

## 2017-11-24 DIAGNOSIS — K8020 Calculus of gallbladder without cholecystitis without obstruction: Secondary | ICD-10-CM

## 2017-11-24 DIAGNOSIS — K8591 Acute pancreatitis with uninfected necrosis, unspecified: Secondary | ICD-10-CM | POA: Diagnosis not present

## 2017-11-24 DIAGNOSIS — K7581 Nonalcoholic steatohepatitis (NASH): Secondary | ICD-10-CM

## 2017-11-24 DIAGNOSIS — K851 Biliary acute pancreatitis without necrosis or infection: Secondary | ICD-10-CM

## 2017-11-24 DIAGNOSIS — N200 Calculus of kidney: Secondary | ICD-10-CM

## 2017-11-24 DIAGNOSIS — K219 Gastro-esophageal reflux disease without esophagitis: Secondary | ICD-10-CM

## 2017-11-24 LAB — GLUCOSE, CAPILLARY
GLUCOSE-CAPILLARY: 166 mg/dL — AB (ref 65–99)
GLUCOSE-CAPILLARY: 147 mg/dL — AB (ref 65–99)
Glucose-Capillary: 141 mg/dL — ABNORMAL HIGH (ref 65–99)
Glucose-Capillary: 144 mg/dL — ABNORMAL HIGH (ref 65–99)
Glucose-Capillary: 145 mg/dL — ABNORMAL HIGH (ref 65–99)
Glucose-Capillary: 151 mg/dL — ABNORMAL HIGH (ref 65–99)

## 2017-11-24 LAB — TRIGLYCERIDES: Triglycerides: 224 mg/dL — ABNORMAL HIGH (ref <150)

## 2017-11-24 LAB — COMPREHENSIVE METABOLIC PANEL
ALK PHOS: 283 U/L — AB (ref 38–126)
ALT: 66 U/L — AB (ref 14–54)
ANION GAP: 6 (ref 5–15)
AST: 53 U/L — ABNORMAL HIGH (ref 15–41)
Albumin: 1.6 g/dL — ABNORMAL LOW (ref 3.5–5.0)
BUN: 62 mg/dL — ABNORMAL HIGH (ref 6–20)
CALCIUM: 8.1 mg/dL — AB (ref 8.9–10.3)
CHLORIDE: 112 mmol/L — AB (ref 101–111)
CO2: 20 mmol/L — ABNORMAL LOW (ref 22–32)
CREATININE: 1.63 mg/dL — AB (ref 0.44–1.00)
GFR, EST AFRICAN AMERICAN: 40 mL/min — AB (ref >60)
GFR, EST NON AFRICAN AMERICAN: 34 mL/min — AB (ref >60)
Glucose, Bld: 175 mg/dL — ABNORMAL HIGH (ref 65–99)
Potassium: 3.6 mmol/L (ref 3.5–5.1)
SODIUM: 138 mmol/L (ref 135–145)
Total Bilirubin: 0.3 mg/dL (ref 0.3–1.2)
Total Protein: 5.8 g/dL — ABNORMAL LOW (ref 6.5–8.1)

## 2017-11-24 LAB — DIFFERENTIAL
BAND NEUTROPHILS: 10 %
BASOS PCT: 0 %
Basophils Absolute: 0 10*3/uL (ref 0.0–0.1)
EOS PCT: 0 %
Eosinophils Absolute: 0 10*3/uL (ref 0.0–0.7)
LYMPHS ABS: 0.7 10*3/uL (ref 0.7–4.0)
Lymphocytes Relative: 4 %
METAMYELOCYTES PCT: 4 %
MONO ABS: 1.1 10*3/uL — AB (ref 0.1–1.0)
MONOS PCT: 6 %
Myelocytes: 5 %
NEUTROS PCT: 71 %
Neutro Abs: 15.9 10*3/uL — ABNORMAL HIGH (ref 1.7–7.7)

## 2017-11-24 LAB — PHOSPHORUS: PHOSPHORUS: 3.7 mg/dL (ref 2.5–4.6)

## 2017-11-24 LAB — CBC
HCT: 24.4 % — ABNORMAL LOW (ref 36.0–46.0)
Hemoglobin: 8 g/dL — ABNORMAL LOW (ref 12.0–15.0)
MCH: 30.1 pg (ref 26.0–34.0)
MCHC: 32.8 g/dL (ref 30.0–36.0)
MCV: 91.7 fL (ref 78.0–100.0)
Platelets: 406 10*3/uL — ABNORMAL HIGH (ref 150–400)
RBC: 2.66 MIL/uL — ABNORMAL LOW (ref 3.87–5.11)
RDW: 15.6 % — AB (ref 11.5–15.5)
WBC: 17.7 10*3/uL — AB (ref 4.0–10.5)

## 2017-11-24 LAB — PREALBUMIN: PREALBUMIN: 7.1 mg/dL — AB (ref 18–38)

## 2017-11-24 LAB — MAGNESIUM: MAGNESIUM: 1.6 mg/dL — AB (ref 1.7–2.4)

## 2017-11-24 MED ORDER — FAT EMULSION 20 % IV EMUL
240.0000 mL | INTRAVENOUS | Status: AC
Start: 2017-11-24 — End: 2017-11-25
  Administered 2017-11-24: 240 mL via INTRAVENOUS
  Filled 2017-11-24: qty 250

## 2017-11-24 MED ORDER — MAGNESIUM SULFATE 2 GM/50ML IV SOLN
2.0000 g | Freq: Once | INTRAVENOUS | Status: AC
  Administered 2017-11-24: 2 g via INTRAVENOUS
  Filled 2017-11-24: qty 50

## 2017-11-24 MED ORDER — LACTATED RINGERS IV SOLN
INTRAVENOUS | Status: AC
  Administered 2017-11-24: 08:00:00 via INTRAVENOUS

## 2017-11-24 MED ORDER — OLANZAPINE 5 MG PO TBDP
5.0000 mg | ORAL_TABLET | Freq: Every day | ORAL | Status: DC
  Administered 2017-11-25 (×2): 5 mg via ORAL
  Filled 2017-11-24 (×3): qty 1

## 2017-11-24 MED ORDER — POLYETHYLENE GLYCOL 3350 17 G PO PACK: 17.0000 g | PACK | Freq: Every day | ORAL | Status: DC | PRN

## 2017-11-24 MED ORDER — ACETAMINOPHEN 500 MG PO TABS
1000.0000 mg | ORAL_TABLET | Freq: Three times a day (TID) | ORAL | Status: DC
  Administered 2017-11-24 – 2017-11-25 (×4): 1000 mg via ORAL
  Filled 2017-11-24 (×4): qty 2

## 2017-11-24 MED ORDER — CLINIMIX/DEXTROSE (5/15) 5 % IV SOLN
INTRAVENOUS | Status: AC
Start: 2017-11-24 — End: 2017-11-25
  Administered 2017-11-24: 18:00:00 via INTRAVENOUS
  Filled 2017-11-24: qty 2000

## 2017-11-24 MED ORDER — METOCLOPRAMIDE HCL 5 MG/ML IJ SOLN
10.0000 mg | Freq: Three times a day (TID) | INTRAMUSCULAR | Status: DC
Start: 2017-11-24 — End: 2017-11-26
  Administered 2017-11-24 – 2017-11-26 (×7): 10 mg via INTRAVENOUS
  Filled 2017-11-24 (×7): qty 2

## 2017-11-24 MED ORDER — DOCUSATE SODIUM 100 MG PO CAPS
100.0000 mg | ORAL_CAPSULE | Freq: Two times a day (BID) | ORAL | Status: DC
  Administered 2017-11-25 (×2): 100 mg via ORAL
  Filled 2017-11-24 (×2): qty 1

## 2017-11-24 MED ORDER — MAGIC MOUTHWASH
10.0000 mL | Freq: Three times a day (TID) | ORAL | Status: DC
  Administered 2017-11-24 – 2017-11-29 (×14): 10 mL via ORAL
  Filled 2017-11-24 (×11): qty 10

## 2017-11-24 NOTE — Consult Note (Signed)
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  She continues to have significant issues with severe abdominal pain, nausea and also severe deconditioning-additionally she had pulmonary edema and volume overload which requires her to remain in the stepdown unit for careful monitoring while she is on TPN.  Her mental status is clear today and she is able to talk to me about her hospital course and also frankly discussed some of the delirium issues that she has been having she wants to be an engaged partner in her care. She is asking that we inform her of updates on her condition and any decisions that are to be made in terms of her care.  Emma Washington requested a palliative consultation not because she is considering the she wants to take a different trajectory or change her goals of complete recovery, but would like us involved as a supportive provider and also to help with her symptom management.  She feels like she could increase her mobility if her pain and symptoms were under better control she understands how critical her mobility will be in her recovery and she also recognizes just how deconditioned she is at this point.  Issues we discussed today: 1.  Her goals of care are for complete recovery.  Continue aggressive treatment and interventions.  Prior to this she reports being healthy and active and no major health problems.  2.  Mobility will be key, and prevention of deconditioning and pressure sores.  3.  Slowly advance her diet as much as she can tolerate-awaiting repeat CT scan.  4.  Symptom management:  #1 abdominal pain secondary  to necrotizing pancreatitis Only on a fentanyl PCA improved on fentanyl, she still remains very high.  I have added Tylenol as a adjuvant for pain control.  #2 severe anxiety, hospital induced delirium mild We will start her on a low dose of Zyprexa at night, she is not sleeping well and has not since admission, Zyprexa will also help with any nausea that she is having.  #3 nausea We will start her on IV Reglan 3 times a day to help with her bowel motility and nausea  #4  Dyspnea Multifactorial likely from deconditioning also from increased intra-abdominal pressure constricting her diaphragm, may also have some continued pulmonary edema would be reasonable to repeat a chest x-ray and make sure that we are diuresing her and watching her renal function daily.  Strongly encouraged incentive spirometry.  He does not want to use a BiPAP at night she has been refusing this.  She seems to be doing well on 4 L of nasal cannula.  Her opioid should be helping with the sensation of dyspnea.  #5 severe xerostomia Her mouth is extremely dry, multifactorial from her previous intubation, being n.p.o. and also being a mouth breather.  I have recommended using Magic mouthwash there may be a element of thrush involved, she needs frequent oral care.   Palliative care will continue to provide support for her care- including spiritual and emotional dimensions of her prolonged hospitalization which may be playing a significant role in prolonging her condition. Will ask chaplain to see for additional support.  Anderson MaltaElizabeth Kapri Nero, DO Palliative Medicine [phone]

## 2017-11-24 NOTE — Progress Notes (Signed)
Date: November 24, 2017 Marcelle SmilingRhonda Amare Bail, BSN, [place]RN3, [place]M [phone] Chart and notes review for patient progress and needs. Will follow for case management and discharge needs. No cm or discharge needs present at time of this review. Next review date: [id number]

## 2017-11-24 NOTE — Progress Notes (Signed)
   11/24/17 1500  Clinical Encounter Type  Visited With Patient;Health care provider  Visit Type Follow-up  Spiritual Encounters  Spiritual Needs Emotional;Prayer   Followed up on a visit from a few weeks ago.  Patient was with some healthcare staff, then we had a chance to visit.  Patient indicted today was much better than yesterday.  Was thankful to be feeling even just a little better.  We prayed together.  Will follow as needed. Chaplain Agustin CreeNewton Laura Caldas

## 2017-11-24 NOTE — Progress Notes (Signed)
     [facility] Progress Note  CC:  Gallstone pancreatitis  Subjective:  Feels better with PCA.  Had a BM again today and is passing flatus.  TNA running and is on clear liquid diet.  Objective:  Vital signs in last 24 hours: Temp:  [97.2 F (36.2 C)-99 F (37.2 C)] 98.4 F (36.9 C) (02/04 0800) Pulse Rate:  [99-108] 99 (02/04 0800) Resp:  [20-43] 23 (02/04 0800) BP: (119-153)/(65-95) 132/90 (02/04 0700) SpO2:  [91 %-96 %] 94 % (02/04 0800) Weight:  [302 lb 7.5 oz (137.2 kg)] 302 lb 7.5 oz (137.2 kg) (02/04 0500) Last BM Date: 11/19/17 General:  Alert, Well-developed, in NAD Heart:  Regular rate and rhythm; no murmurs Pulm:  Respirations even, but with decreased BS. Abdomen:  Obese, distended.  BS heard but quiet and sparse.  Minimal TTP.   Extremities:  Without edema. Neurologic:  Alert and oriented x 4;  grossly normal neurologically. Psych:  Alert and cooperative. Normal mood and affect.  Intake/Output from previous day: 02/03 0701 - 02/04 0700 In: 1975.1 [I.V.:1975.1] Out: 1000 [Urine:1000]  Lab Results: Recent Labs    11/23/17 0400 11/24/17 0446  WBC 15.4* 17.7*  HGB 8.0* 8.0*  HCT 24.9* 24.4*  PLT 398 406*   BMET Recent Labs    11/22/17 0827 11/23/17 0400 11/24/17 0446  NA 138 139 138  K 3.9 3.7 3.6  CL 108 109 112*  CO2 21* 21* 20*  GLUCOSE 148* 162* 175*  BUN 60* 64* 62*  CREATININE 1.78* 1.73* 1.63*  CALCIUM 8.1* 8.2* 8.1*   LFT Recent Labs    11/24/17 0446  PROT 5.8*  ALBUMIN 1.6*  AST 53*  ALT 66*  ALKPHOS 283*  BILITOT 0.3   Dg Chest Port 1 View  Result Date: 11/23/2017 CLINICAL DATA:  Shortness of breath EXAM: PORTABLE CHEST 1 VIEW COMPARISON:  11/19/2017 FINDINGS: Left lower lobe opacity, unchanged, likely reflecting a combination of atelectasis and pleural effusion. Right lung is essentially clear. Mild pulmonary vascular congestion. No frank interstitial edema. No pneumothorax. Cardiomegaly. Right subclavian catheter  terminates at the cavoatrial junction. IMPRESSION: Left lower lobe opacity, unchanged, likely reflecting combination of atelectasis and pleural effusion. Pulmonary vascular congestion without frank interstitial edema. Electronically Signed   By: Charline BillsSriyesh  Krishnan M.D.   On: 11/23/2017 09:04   Assessment / Plan: #1 57 yo female, day # 21 hospital stay, with severe necrotizing Gallstone pancreatitis with SIRs, required vent support earlier in admit.  Transferred back to ICU 1/30 with decline in respiratory status secondary to volume overload- improved.  Ongoing abdominal pain and ileus-more comfortable with PCA.  On TPN for nutritional support.  Off antibiotics.  No plans for ERC.  Plan is for follow up CT next week.  #2 AKI:  Improving.  #3 anemia - secondary to acute illness #4 morbid obesity   LOS: 21 days   Trevaris Pennella D. Arcelia Pals  11/24/2017, 9:04 AM  Pager number [phone]

## 2017-11-24 NOTE — Progress Notes (Signed)
[facility] Progress Note     Subjective: CC: abdominal pain Patient with abdominal pain in the LLQ mostly she reports although notes that pain is somewhat improved from previously.Denies n/v. Tolerating CLD.  UOP good. VSS.   Objective: Vital signs in last 24 hours: Temp:  [97.6 F (36.4 C)-99 F (37.2 C)] 97.6 F (36.4 C) (02/04 1143) Pulse Rate:  [99-108] 99 (02/04 0800) Resp:  [20-43] 22 (02/04 1146) BP: (120-153)/(65-95) 132/90 (02/04 0700) SpO2:  [91 %-96 %] 95 % (02/04 1146) Weight:  [137.2 kg (302 lb 7.5 oz)] 137.2 kg (302 lb 7.5 oz) (02/04 0500) Last BM Date: 11/19/17  Intake/Output from previous day: 02/03 0701 - 02/04 0700 In: 1975.1 [I.V.:1975.1] Out: 1000 [Urine:1000] Intake/Output this shift: No intake/output data recorded.  PE: Gen:  Alert, NAD, pleasant Card:  Regular rate and rhythm, pedal pulses 2+ BL Pulm:  Normal effort, clear to auscultation bilaterally Abd: Soft, tender in LLQ, mildly distended, bowel sounds present, no HSM Skin: warm and dry, no rashes  Psych: A&Ox3   Lab Results:  Recent Labs    11/23/17 0400 11/24/17 0446  WBC 15.4* 17.7*  HGB 8.0* 8.0*  HCT 24.9* 24.4*  PLT 398 406*   BMET Recent Labs    11/23/17 0400 11/24/17 0446  NA 139 138  K 3.7 3.6  CL 109 112*  CO2 21* 20*  GLUCOSE 162* 175*  BUN 64* 62*  CREATININE 1.73* 1.63*  CALCIUM 8.2* 8.1*   PT/INR No results for input(s): LABPROT, INR in the last 72 hours. CMP     Component Value Date/Time   NA 138 11/24/2017 0446   K 3.6 11/24/2017 0446   CL 112 (H) 11/24/2017 0446   CO2 20 (L) 11/24/2017 0446   GLUCOSE 175 (H) 11/24/2017 0446   BUN 62 (H) 11/24/2017 0446   CREATININE 1.63 (H) 11/24/2017 0446   CALCIUM 8.1 (L) 11/24/2017 0446   PROT 5.8 (L) 11/24/2017 0446   ALBUMIN 1.6 (L) 11/24/2017 0446   AST 53 (H) 11/24/2017 0446   ALT 66 (H) 11/24/2017 0446   ALKPHOS 283 (H) 11/24/2017 0446   BILITOT 0.3 11/24/2017 0446   GFRNONAA 34 (L)  11/24/2017 0446   GFRAA 40 (L) 11/24/2017 0446   Lipase     Component Value Date/Time   LIPASE 54 (H) 11/13/2017 1110       Studies/Results: Dg Chest Port 1 View  Result Date: 11/23/2017 CLINICAL DATA:  Shortness of breath EXAM: PORTABLE CHEST 1 VIEW COMPARISON:  11/19/2017 FINDINGS: Left lower lobe opacity, unchanged, likely reflecting a combination of atelectasis and pleural effusion. Right lung is essentially clear. Mild pulmonary vascular congestion. No frank interstitial edema. No pneumothorax. Cardiomegaly. Right subclavian catheter terminates at the cavoatrial junction. IMPRESSION: Left lower lobe opacity, unchanged, likely reflecting combination of atelectasis and pleural effusion. Pulmonary vascular congestion without frank interstitial edema. Electronically Signed   By: Charline Bills M.D.   On: 11/23/2017 09:04    Anti-infectives: Anti-infectives (From admission, onward)   Start     Dose/Rate Route Frequency Ordered Stop   11/12/17 1800  meropenem (MERREM) 1 g in sodium chloride 0.9 % 100 mL IVPB  Status:  Discontinued     1 g 200 mL/hr over 30 Minutes Intravenous Every 8 hours 11/12/17 1115 11/17/17 0954   11/11/17 1000  meropenem (MERREM) 1 g in sodium chloride 0.9 % 100 mL IVPB  Status:  Discontinued     1 g 200 mL/hr over 30 Minutes Intravenous Every  12 hours 11/11/17 0911 11/12/17 1115   11/09/17 1000  piperacillin-tazobactam (ZOSYN) IVPB 3.375 g  Status:  Discontinued     3.375 g 12.5 mL/hr over 240 Minutes Intravenous Every 8 hours 11/09/17 0614 11/09/17 0615   11/09/17 1000  piperacillin-tazobactam (ZOSYN) IVPB 3.375 g  Status:  Discontinued     3.375 g 100 mL/hr over 30 Minutes Intravenous Every 6 hours 11/09/17 0616 11/11/17 0911   11/07/17 2200  piperacillin-tazobactam (ZOSYN) IVPB 2.25 g  Status:  Discontinued     2.25 g 100 mL/hr over 30 Minutes Intravenous Every 8 hours 11/07/17 1218 11/07/17 1741   11/07/17 2200  piperacillin-tazobactam (ZOSYN) IVPB  3.375 g  Status:  Discontinued     3.375 g 12.5 mL/hr over 240 Minutes Intravenous Every 6 hours 11/07/17 1741 11/07/17 1747   11/07/17 2200  piperacillin-tazobactam (ZOSYN) IVPB 3.375 g  Status:  Discontinued     3.375 g 100 mL/hr over 30 Minutes Intravenous Every 6 hours 11/07/17 1747 11/09/17 0613   11/04/17 1100  piperacillin-tazobactam (ZOSYN) IVPB 3.375 g  Status:  Discontinued     3.375 g 12.5 mL/hr over 240 Minutes Intravenous Every 8 hours 11/04/17 1059 11/07/17 1218   11/07/2017 1200  piperacillin-tazobactam (ZOSYN) IVPB 3.375 g     3.375 g 100 mL/hr over 30 Minutes Intravenous  Once 10/23/2017 1157 11/04/2017 1309       Assessment/Plan ARF - offCRRT, Cr1.6 and continuing to improve VDRF - extubated 1/20, on BiPAP as needed Obesity Nephrolithiasis  Gallstone pancreatitis - MRCP on 1/15 with concern for pancreatic necrosis  -WBC17.7, afebrile - no signs of peritonitis or indications for acute surgical intervention at this time. Surgery typically not indicated in the acute phase with pancreatic necrosis -repeat CT1/22showed severe pancreatitis without signs of infection -can repeat a contrasted CT sometime this week   FEN: CLD; TPN VTE: SCDs,SQ heparin ID: IV zosyn 1/14>1/22; IV merrem 1/22>1/28  Repeat CT later this week. Further recommendations to follow.   LOS: 21 days    Wells GuilesKelly Rayburn , [facility] 11/24/2017, 2:47 PM Pager: [phone] Consults: [phone] Mon-Fri 7:00 am-4:30 pm Sat-Sun 7:00 am-11:30 am

## 2017-11-24 NOTE — Progress Notes (Signed)
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 CT suggesting acute necrotizing pancreatitis with 1 mm stone in the left kidney. Pt developed VDRF, weaned 11/09/17, also was on CRRT.     PT Comments    Patient limited mobility this visit to bed, reports expecting a  Visit from another MD. Continue PT.  Follow Up Recommendations  SNF     Equipment Recommendations  Rolling walker with 5" wheels    Recommendations for Other Services       Precautions / Restrictions Precautions Precaution Comments: monitor O2, RR     Mobility  Bed Mobility   Bed Mobility: Rolling Rolling: Max assist         General bed mobility comments: patient assisted off of  bedpan, rolling to left and right multiple times. patient states that another doctor is coming to see her and she  does not want to get OOB at this time.  Transfers                    Ambulation/Gait                 Stairs            Wheelchair Mobility    Modified Rankin (Stroke Patients Only)       Balance                                            Cognition Arousal/Alertness: Awake/alert                                            Exercises      General Comments        Pertinent Vitals/Pain Pain Score: 7  Pain Location: lower abdomen Pain Descriptors / Indicators: Discomfort;Sore Pain Intervention(s): Monitored during session;PCA encouraged    Home Living                      Prior Function            PT Goals (current goals can now be found in the care plan section) Progress towards PT goals: Progressing toward goals    Frequency    Min 3X/week      PT Plan Current plan remains appropriate    Co-evaluation              AM-PAC  PT "6 Clicks" Daily Activity  Outcome Measure  Difficulty turning over in bed (including adjusting bedclothes, sheets and blankets)?: A Lot Difficulty moving from lying on back to sitting on the side of the bed? : Unable Difficulty sitting down on and standing up from a chair with arms (e.g., wheelchair, bedside commode, etc,.)?: Unable Help needed moving to and from a bed to chair (including a wheelchair)?: A Lot Help needed walking in hospital room?: A Lot Help needed climbing 3-5 steps with a railing? : Total 6 Click Score: 9    End of Session   Activity Tolerance: Patient tolerated treatment well Patient left: in bed;with call bell/phone within reach;with nursing/sitter in room Nurse Communication: Mobility status PT  Visit Diagnosis: Muscle weakness (generalized) (M62.81);Difficulty in walking, not elsewhere classified (R26.2)     Time: 1610-9604 PT Time Calculation (min) (ACUTE ONLY): 13 min  Charges:  $Therapeutic Activity: 8-22 mins                    G CodesBlanchard Kelch PT [phone]    Rada Hay 11/24/2017, 1:35 PM

## 2017-11-24 NOTE — Progress Notes (Addendum)
PROGRESS NOTE        PATIENT DETAILS Name: Emma Washington Age: 57 y.o. Sex: female Date of Birth: December 11, 1960 Admit Date: 11/08/2017 Admitting Physician Albertine Grates, MD PCP:No primary care provider on file.  Brief Narrative: Patient is a 57 y.o. 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  She briefly required CRRT, subsequently extubated and transferred to try to hospitalist service.  Although improved-hospital course was complicated by respiratory distress secondary to volume overload/pulmonary edema on 1/30 requiring transfer to stepdown unit, worsening abdominal pain requiring initiation of fentanyl PCA on 2/1.  After discussion with general surgery/GI-plans are to proceed with a repeat CT scan of the abdomen sometime next week-hopefully with IV contrast if her renal function allows.  See below for further details  Subjective:  Patient in bed, appears comfortable, denies any headache, no fever, no chest pain or pressure, no shortness of breath , still has epigastric abdominal pain with mild nausea at times. No focal weakness.   Assessment/Plan:  Severe necrotizing gallstone pancreatitis: Overall improved-however a few days back was unable to tolerate advancement to full liquids.  She remains on clear liquids and on TNA.  Since she continued to have significant abdominal pain inspite of being on 75 mcg of transdermal fentanyl, and IV fentanyl for breakthrough pain-she was started on IV fentanyl PCA on 2/1 with significant relief of her symptoms. Will eventually need Cholecystectomy.     Continue supportive care with bowel rest, IV TNA for nutrition, pain control, currently off antibiotics, repeat scan per general surgery and GI if needed.  Discussed with the patient future plan of care in case there is not much  significant improvement in the coming future, she will start thinking about other modalities of care including palliative care.  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  Acute kidney injury: Suspect this is hemodynamically mediated in the setting of severe pancreatitis-briefly required CRRT during this hospital stay.,  She appears to be intravascularly slightly dehydrated at this time, urine sodium is less than 10, BMP stable, chest x-ray no acute pulmonary edema, urine osmolality was 490 which is significantly high, she has been placed on gentle IV fluids with improving renal function.  Acute hypoxic respiratory failure: She was intubated from 1/18-1/20.  She was medically stable on nasal cannula-however on 1/30 she developed worsening hypoxia and respiratory distress thought to be due to volume overload with some components from anxiety and pain.  After diuresis with Lasix and BiPAP shortness of breath has improved on 01/07/2018, BMP stable, chest x-ray shows no pulmonary edema, hold further diuresis, continue BiPAP as needed and monitor .  Anemia of critical illness: Hemoglobin currently stable-no indication for transfusion at this time  Morbid obesity  Suspected OHS/OSA: Outpatient sleep study-BiPAP nightly if she tolerates.    DVT Prophylaxis: Prophylactic Heparin  Code Status: Full code  Family Communication: None at bedside  Disposition Plan: Remain inpatient-remain in stepdown .  Antimicrobial agents: Anti-infectives (From admission, onward)   Start     Dose/Rate Route Frequency Ordered Stop   11/12/17 1800  meropenem (MERREM) 1 g in sodium chloride 0.9 % 100 mL IVPB  Status:  Discontinued     1 g 200 mL/hr over 30 Minutes Intravenous Every 8 hours 11/12/17 1115 11/17/17  1610   11/11/17 1000  meropenem (MERREM) 1 g in sodium chloride 0.9 % 100 mL IVPB  Status:  Discontinued     1 g 200 mL/hr over 30 Minutes Intravenous Every 12 hours 11/11/17 0911 11/12/17 1115   11/09/17 1000   piperacillin-tazobactam (ZOSYN) IVPB 3.375 g  Status:  Discontinued     3.375 g 12.5 mL/hr over 240 Minutes Intravenous Every 8 hours 11/09/17 0614 11/09/17 0615   11/09/17 1000  piperacillin-tazobactam (ZOSYN) IVPB 3.375 g  Status:  Discontinued     3.375 g 100 mL/hr over 30 Minutes Intravenous Every 6 hours 11/09/17 0616 11/11/17 0911   11/07/17 2200  piperacillin-tazobactam (ZOSYN) IVPB 2.25 g  Status:  Discontinued     2.25 g 100 mL/hr over 30 Minutes Intravenous Every 8 hours 11/07/17 1218 11/07/17 1741   11/07/17 2200  piperacillin-tazobactam (ZOSYN) IVPB 3.375 g  Status:  Discontinued     3.375 g 12.5 mL/hr over 240 Minutes Intravenous Every 6 hours 11/07/17 1741 11/07/17 1747   11/07/17 2200  piperacillin-tazobactam (ZOSYN) IVPB 3.375 g  Status:  Discontinued     3.375 g 100 mL/hr over 30 Minutes Intravenous Every 6 hours 11/07/17 1747 11/09/17 0613   11/04/17 1100  piperacillin-tazobactam (ZOSYN) IVPB 3.375 g  Status:  Discontinued     3.375 g 12.5 mL/hr over 240 Minutes Intravenous Every 8 hours 11/04/17 1059 11/07/17 1218   11/02/2017 1200  piperacillin-tazobactam (ZOSYN) IVPB 3.375 g     3.375 g 100 mL/hr over 30 Minutes Intravenous  Once 10/22/2017 1157 11/20/2017 1309      Procedures: ETT 1/18 >> 1/20  CONSULTS:  pulmonary/intensive care, GI and general surgery  Time spent: 25 minutes -Greater than 50% of this time was spent in counseling, explanation of diagnosis, planning of further management, and coordination of care.  MEDICATIONS: Scheduled Meds: . chlorhexidine  15 mL Mouth Rinse BID  . Chlorhexidine Gluconate Cloth  6 each Topical Daily  . feeding supplement  1 Container Oral BID BM  . fentaNYL   Intravenous Q4H  . heparin injection (subcutaneous)  5,000 Units Subcutaneous Q8H  . insulin aspart  0-15 Units Subcutaneous Q4H  . mouth rinse  15 mL Mouth Rinse q12n4p  . pantoprazole (PROTONIX) IV  40 mg Intravenous Q12H  . sodium chloride flush  10-40 mL  Intracatheter Q12H   Continuous Infusions: . TPN (CLINIMIX) Adult without lytes     And  . fat emulsion    . lactated ringers 50 mL/hr at 11/24/17 0814  . TPN (CLINIMIX) Adult without lytes 110 mL/hr at 11/23/17 1746   PRN Meds:.acetaminophen, albuterol, ALPRAZolam, diphenhydrAMINE **OR** diphenhydrAMINE, lip balm, naLOXone (NARCAN)  injection, naloxone **AND** sodium chloride flush, ondansetron (ZOFRAN) IV, sodium chloride flush, sodium chloride flush   PHYSICAL EXAM:  Vital signs: Vitals:   11/24/17 0500 11/24/17 0600 11/24/17 0700 11/24/17 0800  BP: 129/66 134/74 132/90   Pulse: (!) 101 100 (!) 106 99  Resp: (!) 29 (!) 28 (!) 25 (!) 23  Temp:    98.4 F (36.9 C)  TempSrc:    Oral  SpO2: 94% 96% 95% 94%  Weight: (!) 137.2 kg (302 lb 7.5 oz)     Height:       Filed Weights   11/22/17 0500 11/23/17 0422 11/24/17 0500  Weight: (!) 139.4 kg (307 lb 5.1 oz) (!) 137.3 kg (302 lb 11.1 oz) (!) 137.2 kg (302 lb 7.5 oz)   Body mass index is 43.4 kg/m.   Exam  Awake Alert, appears tired and fatigued, No new F.N deficits, Normal affect [place].AT,PERRAL Supple Neck,No JVD, No cervical lymphadenopathy appriciated.  Symmetrical Chest wall movement, Good air movement bilaterally, CTAB RRR,No Gallops, Rubs or new Murmurs, No Parasternal Heave +ve B.Sounds, Abd is distended with mild epigastric tenderness, No organomegaly appriciated, No rebound - guarding or rigidity. No Cyanosis, Clubbing or edema, No new Rash or bruise     I have personally reviewed following labs and imaging studies  LABORATORY DATA: CBC: Recent Labs  Lab 11/19/17 0402 11/23/17 0400 11/24/17 0446  WBC 18.7* 15.4* 17.7*  NEUTROABS  --   --  15.9*  HGB 8.1* 8.0* 8.0*  HCT 25.5* 24.9* 24.4*  MCV 94.1 92.6 91.7  PLT 470* 398 406*    Basic Metabolic Panel: Recent Labs  Lab 11/20/17 0448 11/21/17 0554 11/22/17 0827 11/23/17 0400 11/24/17 0446  NA 140 139 138 139 138  K 3.9 3.7 3.9 3.7 3.6  CL 114*  110 108 109 112*  CO2 20* 21* 21* 21* 20*  GLUCOSE 180* 146* 148* 162* 175*  BUN 59* 61* 60* 64* 62*  CREATININE 1.97* 1.82* 1.78* 1.73* 1.63*  CALCIUM 7.9* 8.0* 8.1* 8.2* 8.1*  MG 1.8 1.6* 1.9 1.8 1.6*  PHOS 5.3* 4.5 4.7* 4.2 3.7    GFR: Estimated Creatinine Clearance: 59.1 mL/min (A) (by C-G formula based on SCr of 1.63 mg/dL (H)).  Liver Function Tests: Recent Labs  Lab 11/19/17 0402 11/20/17 0448 11/24/17 0446  AST 83* 38 53*  ALT 89* 58* 66*  ALKPHOS 182* 160* 283*  BILITOT 0.6 0.4 0.3  PROT 5.8* 5.9* 5.8*  ALBUMIN 1.8* 1.9* 1.6*   No results for input(s): LIPASE, AMYLASE in the last 168 hours. No results for input(s): AMMONIA in the last 168 hours.  Coagulation Profile: No results for input(s): INR, PROTIME in the last 168 hours.  Cardiac Enzymes: No results for input(s): CKTOTAL, CKMB, CKMBINDEX, TROPONINI in the last 168 hours.  BNP (last 3 results) No results for input(s): PROBNP in the last 8760 hours.  HbA1C: No results for input(s): HGBA1C in the last 72 hours.  CBG: Recent Labs  Lab 11/23/17 1513 11/23/17 1953 11/23/17 2315 11/24/17 0338 11/24/17 0735  GLUCAP 161* 143* 147* 151* 166*    Lipid Profile: Recent Labs    11/24/17 0446  TRIG 224*    Thyroid Function Tests: No results for input(s): TSH, T4TOTAL, FREET4, T3FREE, THYROIDAB in the last 72 hours.  Anemia Panel: No results for input(s): VITAMINB12, FOLATE, FERRITIN, TIBC, IRON, RETICCTPCT in the last 72 hours.  Urine analysis:    Component Value Date/Time   COLORURINE AMBER (A) 11/06/2017 1245   APPEARANCEUR CLOUDY (A) 11/06/2017 1245   LABSPEC 1.018 11/06/2017 1245   PHURINE 5.0 11/06/2017 1245   GLUCOSEU 50 (A) 11/06/2017 1245   HGBUR LARGE (A) 11/06/2017 1245   BILIRUBINUR NEGATIVE 11/06/2017 1245   KETONESUR NEGATIVE 11/06/2017 1245   PROTEINUR 30 (A) 11/06/2017 1245   NITRITE NEGATIVE 11/06/2017 1245   LEUKOCYTESUR NEGATIVE 11/06/2017 1245    Sepsis Labs: Lactic  Acid, Venous No results found for: LATICACIDVEN  MICROBIOLOGY: No results found for this or any previous visit (from the past 240 hour(s)).  RADIOLOGY STUDIES/RESULTS: Ct Abdomen Pelvis Wo Contrast  Result Date: 11/11/2017 CLINICAL DATA:  57 year old female with a history of necrotizing pancreatitis now with worsening leukocytosis. Evaluate for evidence of superinfection. EXAM: CT ABDOMEN AND PELVIS WITHOUT CONTRAST TECHNIQUE: Multidetector CT imaging of the abdomen and pelvis was performed following  the standard protocol without IV contrast. COMPARISON:  Prior CT scan of the abdomen and pelvis Nov 25, 2017 FINDINGS: Lower chest: Small bilateral pleural effusions and associated lower lobe atelectasis. The intracardiac blood pool is hypodense relative to the adjacent myocardium consistent with anemia. No pericardial effusion. Unremarkable distal thoracic esophagus. Hepatobiliary: Severe hypoattenuation of the hepatic parenchyma consistent with hepatic steatosis. There is some sparing around the gallbladder fossa. High attenuation material within the gallbladder consistent with sludge and/or small stones. No intra or extrahepatic biliary ductal dilatation. Pancreas: The pancreas is diffusely edematous. No discrete mass or pseudocyst. Inflammatory stranding extends throughout the peripancreatic soft tissues and into the mesenteric root. The degree of peripancreatic inflammatory stranding has significantly progressed compared to 11/25/2017. Evaluation is limited in the absence of intravenous contrast. Spleen: Normal in size without focal abnormality. Adrenals/Urinary Tract: Adrenal glands are unremarkable. Kidneys are normal, without renal calculi, focal lesion, or hydronephrosis. Foley catheter in the bladder. Stomach/Bowel: No evidence of obstruction or focal bowel wall thickening. Normal appendix in the right lower quadrant. The terminal ileum is unremarkable. Vascular/Lymphatic: Limited evaluation in the  absence of intravenous contrast. No aneurysm, significant atherosclerotic calcifications or suspicious lymphadenopathy. Reproductive: Uterus and bilateral adnexa are unremarkable. Other: Progressive inflammatory stranding throughout the mesenteric root and peripancreatic soft tissues. Small volume ascites along the greater curvature of the stomach, in the left perisplenic space and extending inferiorly along the left greater than right colic gutters to layer in the anatomic pelvis. No loculation or intra fluid gas to suggest infection. Musculoskeletal: No acute fracture or aggressive appearing lytic or blastic osseous lesion. IMPRESSION: 1. Progressive severe pancreatitis and secondary inflammatory changes throughout the peripancreatic soft tissues and mesenteric root. There is now small volume ascites which is likely reactive in nature. No loculation or intra fluid gas to suggest infection. 2. Small bilateral pleural effusions and associated atelectasis. 3. The intracardiac blood pool is hypodense relative to the adjacent myocardium consistent with anemia. 4. At least moderate hepatic steatosis. 5. Cholelithiasis. Electronically Signed   By: Malachy Moan M.D.   On: 11/11/2017 14:57   Dg Abd 1 View  Result Date: 11/10/2017 CLINICAL DATA:  NG tube placement Best obtainable notes due to patients size, pain and altered mental status. I could not get her to stop rocking and talking EXAM: ABDOMEN - 1 VIEW COMPARISON:  Earlier film of the same day FINDINGS: Weighted enteric tube tip is in the gastric body as before. Normal bowel gas pattern. The lower abdomen is is excluded. IMPRESSION: 1. Stable placement of feeding tube tip in the gastric body. Electronically Signed   By: Corlis Leak M.D.   On: 11/10/2017 19:29   Dg Abd 1 View  Result Date: 11/07/2017 CLINICAL DATA:  Orogastric tube placement. EXAM: ABDOMEN - 1 VIEW COMPARISON:  None. FINDINGS: Orogastric tube extends into the stomach. Bowel gas pattern  shows probable ileus. IMPRESSION: Orogastric tube extends into the stomach. Electronically Signed   By: Irish Lack M.D.   On: 11/07/2017 15:23   US Abdomen Complete  Result Date: November 25, 2017 CLINICAL DATA:  Epigastric and right upper quadrant discomfort, syncope, vomiting, hematuria. EXAM: ABDOMEN ULTRASOUND COMPLETE COMPARISON:  Abdominal and pelvic CT scan of today's date FINDINGS: Gallbladder: Probable small stones. No gallbladder wall thickening or pericholecystic fluid or positive sonographic Murphy's sign. Common bile duct: Diameter: 6.7 mm Liver: Visualization of the hepatic parenchyma is limited by bowel poor penetration of the ultrasound beam. The echotexture is subjectively increased. There is no focal mass or ductal  dilation. Portal vein is patent on color Doppler imaging with normal direction of blood flow towards the liver. IVC: No abnormality visualized. Pancreas: Visualization of the pancreas is limited due to bowel gas. Spleen: Size and appearance within normal limits. Right Kidney: Length: 13.2 cm. Echogenicity within normal limits. No mass or hydronephrosis visualized. Left Kidney: Length: 11.9 cm. Echogenicity within normal limits. No mass or hydronephrosis visualized. Abdominal aorta: Bowel gas limits evaluation of the abdominal aorta. Other findings: No significant ascites is observed. IMPRESSION: The study is limited overall due to bowel gas and the patient's body habitus. Multiple tiny gallstones are suspected. There is mild prominence of the common bile duct without definite intraluminal stones. Increased hepatic echotexture compatible with fatty infiltrative change. Poor evaluation of the pancreas due to bowel gas and surrounding inflammatory changes. Electronically Signed   By: David  [place] M.D.   On: 11-18-2017 14:02   US Renal  Result Date: 11/06/2017 CLINICAL DATA:  Acute kidney injury. EXAM: RENAL / URINARY TRACT ULTRASOUND COMPLETE COMPARISON:  None. FINDINGS: Right  Kidney: Length: 12.8 cm. Echogenicity within normal limits. No mass or hydronephrosis visualized. Left Kidney: Length: 11.1 cm. Echogenicity within normal limits. No mass or hydronephrosis visualized. Bladder: Not visualize. Other: Small volume pelvic ascites. IMPRESSION: 1. Normal renal ultrasound. Electronically Signed   By: Elige Ko   On: 11/06/2017 15:15   Mr 3d Recon At Scanner  Result Date: 11/04/2017 CLINICAL DATA:  Pancreatitis with gallstones. EXAM: MRI ABDOMEN WITHOUT AND WITH CONTRAST (INCLUDING MRCP) TECHNIQUE: Multiplanar multisequence MR imaging of the abdomen was performed both before and after the administration of intravenous contrast. Heavily T2-weighted images of the biliary and pancreatic ducts were obtained, and three-dimensional MRCP images were rendered by post processing. CONTRAST:  20 cc MultiHance COMPARISON:  CT scan 11/03/2016 FINDINGS: Markedly motion degraded study. Lower chest: The heart is enlarged without substantial pericardial effusion. Hepatobiliary: Liver measures 20.8 cm and craniocaudal length, enlarged. Multiple gallstones are identified, measuring up to 7 mm diameter. Some stones or trapped behind a fold in the gallbladder fundus. MRCP images are markedly motion degraded and nondiagnostic for tiny common duct stones. Axial T2 weighted single shot imaging shows common duct measuring 7 mm diameter. Common bile duct in the head of the pancreas also measures 7 mm diameter. The axial T2 weighted sequences reveal some central flow artifact in the common duct although a very tiny dependent signal void is seen in the common bile duct on axial image 34 series 5 that maps to a tiny filling defect in the common bile duct seen on coronal MRCP image 34 of series 6. This appearance is compatible with a tiny common bile duct stone. Pancreas: . pancreas is diffusely edematous with peripancreatic edema tracking diffusely in the anterior pararenal space. Postcontrast imaging shows  enhancement in the head of pancreas extreme pancreatic tail but most of the body and tail of pancreas do not enhance. No focal or rim enhancing fluid collection identified in the retroperitoneal space. Spleen:  No splenomegaly. No focal mass lesion. Adrenals/Urinary Tract: No adrenal nodule or mass. Limited assessment of the kidney shows no hydronephrosis or gross mass lesion. Stomach/Bowel: Stomach is nondistended. No gastric wall thickening. No evidence of outlet obstruction. Duodenum is normally positioned as is the ligament of Treitz. No small bowel or colonic dilatation within the visualized abdomen. Vascular/Lymphatic: Portal vein and superior mesenteric vein are patent. Splenic vein is patent. Celiac axis and SMA are patent. Small retroperitoneal / peripancreatic lymph nodes noted. Other: Free  fluid is noted around the liver and spleen. Mesenteric edema/fluid is evident. Musculoskeletal: No abnormal marrow enhancement within the visualized bony anatomy. IMPRESSION: 1. Markedly motion degraded study. 2. Cholelithiasis with borderline distention of the extrahepatic bile ducts. 3 mm filling defect in the common duct as it enters the head of the pancreas is consistent with with choledocholithiasis. 3. Nonenhancement involving much of the pancreatic body and tail parenchyma, highly suggestive of pancreatic necrosis. 4. Extensive edema/inflammation in the retroperitoneal space with intraperitoneal fluid and diffuse mesenteric edema in the abdomen. No focal/organized or rim enhancing fluid collection at this time to suggest evolving pseudocyst or abscess. 5. Hepatomegaly. 6. Portal vein is patent. Electronically Signed   By: Kennith Center M.D.   On: 11/04/2017 08:12   Dg Chest Port 1 View  Result Date: 11/23/2017 CLINICAL DATA:  Shortness of breath EXAM: PORTABLE CHEST 1 VIEW COMPARISON:  11/19/2017 FINDINGS: Left lower lobe opacity, unchanged, likely reflecting a combination of atelectasis and pleural effusion.  Right lung is essentially clear. Mild pulmonary vascular congestion. No frank interstitial edema. No pneumothorax. Cardiomegaly. Right subclavian catheter terminates at the cavoatrial junction. IMPRESSION: Left lower lobe opacity, unchanged, likely reflecting combination of atelectasis and pleural effusion. Pulmonary vascular congestion without frank interstitial edema. Electronically Signed   By: Charline Bills M.D.   On: 11/23/2017 09:04   Dg Chest Port 1 View  Result Date: 11/11/2017 CLINICAL DATA:  Pancreatitis.  Shortness of breath. EXAM: PORTABLE CHEST 1 VIEW COMPARISON:  11/09/2017. FINDINGS: Interim extubation. Interim placement of feeding scratched it interim removal of NG tube placement of feeding tube. Right IJ line in stable position. Cardiomegaly. Low lung volumes. Left lower lobe infiltrate suggesting pneumonia. Asymmetric pulmonary edema could also present this fashion. Small left pleural effusion. No pneumothorax. IMPRESSION: 1. Interim extubation and removal of NG tube. Interim placement of feeding tube, its tip is below left hemidiaphragm. Right IJ line stable position. 2. Low lung volumes with basilar atelectasis. Left lower lobe infiltrate/edema and small left pleural effusion. 3.  Stable cardiomegaly. Electronically Signed   By: Maisie Fus  Register   On: 11/11/2017 06:57   Dg Chest Port 1 View  Result Date: 11/09/2017 CLINICAL DATA:  Hypoxia EXAM: PORTABLE CHEST 1 VIEW COMPARISON:  November 08, 2017 FINDINGS: Endotracheal tube tip is 3.2 cm above the carina. Nasogastric tube tip and side port are below the diaphragm. Central catheter tip is in the superior vena cava. No pneumothorax. There is persistent atelectatic change in the left base with small left pleural effusion. There is mild right base atelectasis. Heart is upper normal in size with pulmonary vascularity within normal limits. No adenopathy. No bone lesions. IMPRESSION: Tube and catheter positions as described without  pneumothorax. Persistent bibasilar atelectasis, more on the left than on the right, stable. No new opacity. Small left pleural effusion. Stable cardiac silhouette. Electronically Signed   By: Bretta Bang III M.D.   On: 11/09/2017 07:16   Dg Chest Port 1 View  Result Date: 11/08/2017 CLINICAL DATA:  Hypoxia EXAM: PORTABLE CHEST 1 VIEW COMPARISON:  November 07, 2017 FINDINGS: Endotracheal tube tip is 3.7 cm above the carina. Nasogastric tube tip and side port are below the diaphragm. Central catheter tip is in the superior vena cava. No pneumothorax. There is atelectatic change in both lung bases, more on the left than on the right, stable. There is a minimal pleural effusion on each side. Heart is mildly enlarged with pulmonary vascularity within normal limits. No adenopathy. No bone lesions. IMPRESSION:  Tube and catheter positions as described without evident pneumothorax. Bibasilar atelectasis, more on the left than on the right, with small bilateral pleural effusions. Stable cardiac prominence. Electronically Signed   By: Bretta Bang III M.D.   On: 11/08/2017 07:13   Dg Chest Port 1 View  Result Date: 11/07/2017 CLINICAL DATA:  Central line placement. EXAM: PORTABLE CHEST 1 VIEW COMPARISON:  0240 hours FINDINGS: Interval placement of right jugular non tunneled hemodialysis catheter with the catheter tip at the level of the mid SVC. No pneumothorax. Endotracheal tube remains present with the tip approximately 3.5 cm above the carina. Gastric decompression tube extends into the stomach. Lungs show improved aeration bilaterally and decrease in pulmonary edema. Probable component of left pleural fluid. IMPRESSION: Non tunneled dialysis catheter tip in SVC. No pneumothorax identified after placement. Lungs show improved aeration with decrease in pulmonary edema. Electronically Signed   By: Irish Lack M.D.   On: 11/07/2017 15:22   Dg Chest Port 1 View  Result Date: 11/07/2017 CLINICAL DATA:   57 y/o  F; post intubation, ET tube position. EXAM: PORTABLE CHEST 1 VIEW COMPARISON:  11/06/2017 chest radiograph FINDINGS: Low lung volumes. Stable enlarged cardiac silhouette given projection and technique. Increased diffuse hazy airspace opacities and small effusions probably representing pulmonary edema. Endotracheal tube is 1.4 cm from carina. No acute osseous abnormality identified. IMPRESSION: Endotracheal tube 1.4 cm from carina. Increased hazy lung opacities compatible with pulmonary edema and small effusions bilaterally. Electronically Signed   By: Mitzi Hansen M.D.   On: 11/07/2017 04:24   Dg Chest Port 1 View  Result Date: 11/06/2017 CLINICAL DATA:  Acute respiratory failure EXAM: PORTABLE CHEST 1 VIEW COMPARISON:  11/04/2017 FINDINGS: Hypoventilation with decreased lung volume. Increase in bibasilar atelectasis left greater than right. Probable left pleural effusion. Pulmonary vascular congestion suggesting mild heart failure. IMPRESSION: Hypoventilation with increased atelectasis in lung bases left greater than right. Small left effusion. Progression of pulmonary vascular congestion suggesting mild fluid overload. Electronically Signed   By: Marlan Palau M.D.   On: 11/06/2017 09:05   Dg Chest Port 1 View  Result Date: 11/04/2017 CLINICAL DATA:  Shortness of breath, weakness, and fever. EXAM: PORTABLE CHEST 1 VIEW COMPARISON:  Report from prior chest radiograph 10/19/2013 FINDINGS: The patient is rotated to the right on today's radiograph, reducing diagnostic sensitivity and specificity. Mild enlargement of the cardiopericardial silhouette. Prominence of mediastinal contour is probably from fatty mediastinal tissues based on comparison to the recent abdomen MRI. Low lung volumes are present, causing crowding of the pulmonary vasculature. Indistinct airspace opacity at the left lung base compatible with atelectasis or pneumonia. Similar indistinct density at the right lung base  medially. Rightward tracheal deviation, partly rotation related IMPRESSION: 1. Left greater than right airspace opacities in the lung bases favoring atelectasis or pneumonia. 2. Low lung volumes are present, causing crowding of the pulmonary vasculature. 3. Mild enlargement of the cardiopericardial silhouette. Electronically Signed   By: Gaylyn Rong M.D.   On: 11/04/2017 14:27   Dg Chest Port 1v Same Day  Result Date: 11/19/2017 CLINICAL DATA:  Increase shortness of breath EXAM: PORTABLE CHEST 1 VIEW COMPARISON:  11/11/2017 FINDINGS: 0851 hours. Low lung volumes. The cardio pericardial silhouette is enlarged. There is pulmonary vascular congestion without overt pulmonary edema. Persistent left base collapse/consolidation, similar. Right PICC line tip overlies the distal SVC. The visualized bony structures of the thorax are intact. IMPRESSION: No substantial interval change. Cardiomegaly with vascular congestion and left base collapse/consolidation. Electronically  Signed   By: Kennith CenterEric  Mansell M.D.   On: 11/19/2017 10:02   Dg Abd Decub  Result Date: 11/19/2017 CLINICAL DATA:  Ileus, abdominal distention. EXAM: ABDOMEN - 1 VIEW DECUBITUS COMPARISON:  11/13/2017 and CT abdomen pelvis 11/11/2017. FINDINGS: Two left lateral decubitus views of the abdomen show air-fluid levels in dilated bowel. No definite free air. Abdomen is incompletely imaged. IMPRESSION: Gaseous distention of bowel with air-fluid levels is compatible with the given history of an ileus. Abdomen is incompletely imaged. Electronically Signed   By: Leanna BattlesMelinda  Blietz M.D.   On: 11/19/2017 12:02   Dg Abd Portable 1v  Result Date: 11/13/2017 CLINICAL DATA:  NG tube placement. EXAM: PORTABLE ABDOMEN - 1 VIEW COMPARISON:  No recent prior. FINDINGS: NG tube noted with tip coiled stomach. No bowel distention noted. Bibasilar atelectasis. No acute bony abnormality noted. IMPRESSION: NG tube noted with tip coiled in the stomach. Electronically  Signed   By: Maisie Fushomas  Register   On: 11/13/2017 10:07   Dg Abd Portable 1v  Result Date: 11/10/2017 CLINICAL DATA:  Status post feeding tube placement. EXAM: PORTABLE ABDOMEN - 1 VIEW COMPARISON:  Abdominal radiograph of 07 November 2017 FINDINGS: The nasogastric tube has been removed and replaced with a radiodense tipped feeding tube. Thetube tip is in the mid gastric body. IMPRESSION: The tip of the feeding tube is in the mid gastric body. Electronically Signed   By: David  SwazilandJordan M.D.   On: 11/10/2017 11:28   Mr Abdomen Mrcp Vivien RossettiW Wo Contast  Result Date: 11/04/2017 CLINICAL DATA:  Pancreatitis with gallstones. EXAM: MRI ABDOMEN WITHOUT AND WITH CONTRAST (INCLUDING MRCP) TECHNIQUE: Multiplanar multisequence MR imaging of the abdomen was performed both before and after the administration of intravenous contrast. Heavily T2-weighted images of the biliary and pancreatic ducts were obtained, and three-dimensional MRCP images were rendered by post processing. CONTRAST:  20 cc MultiHance COMPARISON:  CT scan 11/03/2016 FINDINGS: Markedly motion degraded study. Lower chest: The heart is enlarged without substantial pericardial effusion. Hepatobiliary: Liver measures 20.8 cm and craniocaudal length, enlarged. Multiple gallstones are identified, measuring up to 7 mm diameter. Some stones or trapped behind a fold in the gallbladder fundus. MRCP images are markedly motion degraded and nondiagnostic for tiny common duct stones. Axial T2 weighted single shot imaging shows common duct measuring 7 mm diameter. Common bile duct in the head of the pancreas also measures 7 mm diameter. The axial T2 weighted sequences reveal some central flow artifact in the common duct although a very tiny dependent signal void is seen in the common bile duct on axial image 34 series 5 that maps to a tiny filling defect in the common bile duct seen on coronal MRCP image 34 of series 6. This appearance is compatible with a tiny common bile duct  stone. Pancreas: . pancreas is diffusely edematous with peripancreatic edema tracking diffusely in the anterior pararenal space. Postcontrast imaging shows enhancement in the head of pancreas extreme pancreatic tail but most of the body and tail of pancreas do not enhance. No focal or rim enhancing fluid collection identified in the retroperitoneal space. Spleen:  No splenomegaly. No focal mass lesion. Adrenals/Urinary Tract: No adrenal nodule or mass. Limited assessment of the kidney shows no hydronephrosis or gross mass lesion. Stomach/Bowel: Stomach is nondistended. No gastric wall thickening. No evidence of outlet obstruction. Duodenum is normally positioned as is the ligament of Treitz. No small bowel or colonic dilatation within the visualized abdomen. Vascular/Lymphatic: Portal vein and superior mesenteric vein are  patent. Splenic vein is patent. Celiac axis and SMA are patent. Small retroperitoneal / peripancreatic lymph nodes noted. Other: Free fluid is noted around the liver and spleen. Mesenteric edema/fluid is evident. Musculoskeletal: No abnormal marrow enhancement within the visualized bony anatomy. IMPRESSION: 1. Markedly motion degraded study. 2. Cholelithiasis with borderline distention of the extrahepatic bile ducts. 3 mm filling defect in the common duct as it enters the head of the pancreas is consistent with with choledocholithiasis. 3. Nonenhancement involving much of the pancreatic body and tail parenchyma, highly suggestive of pancreatic necrosis. 4. Extensive edema/inflammation in the retroperitoneal space with intraperitoneal fluid and diffuse mesenteric edema in the abdomen. No focal/organized or rim enhancing fluid collection at this time to suggest evolving pseudocyst or abscess. 5. Hepatomegaly. 6. Portal vein is patent. Electronically Signed   By: Kennith Center M.D.   On: 11/04/2017 08:12   Ct Renal Stone Study  Result Date: Nov 14, 2017 CLINICAL DATA:  Left-sided flank pain and  hematuria.  Vomiting. EXAM: CT ABDOMEN AND PELVIS WITHOUT CONTRAST TECHNIQUE: Multidetector CT imaging of the abdomen and pelvis was performed following the standard protocol without oral or intravenous contrast material administration. COMPARISON:  CT abdomen and pelvis April 09, 2008 FINDINGS: Lower chest: There is bibasilar atelectatic change. Hepatobiliary: There is hepatic steatosis. No focal liver lesions are evident on this noncontrast enhanced study. Gallbladder wall is not appreciably thickened. There is no biliary duct dilatation. Pancreas: There is edema throughout the pancreas with soft tissue stranding in the adjacent peripancreatic mesentery throughout the peripancreatic region. Fluid tracks to the right of the pancreatic head and inferior to the uncinate process as well as anterior to the pancreatic head. Fluid tracks from the body and tail the pancreas anteriorly to reach the greater curvature of the stomach in the midportion of the gastric body region. A well-defined pseudocyst type lesion is not seen. There is no pancreatic duct dilatation. Spleen: No splenic lesions are evident. Adrenals/Urinary Tract: Adrenals bilaterally appear normal. Kidneys bilaterally show no evident mass or hydronephrosis. There is a 1 mm calculus in the upper pole left kidney, nonobstructing. There is no evident ureteral calculus on either side. Urinary bladder is midline with wall thickness within normal limits. Stomach/Bowel: There is mild wall thickening along the greater curvature of the stomach in the midbody region in along the second and third duodenum due to nearby pancreatitis. No other bowel wall thickening is evident. There is no appreciable bowel obstruction. No evident free air or portal venous air. Vascular/Lymphatic: There is no abdominal aortic aneurysm. No vascular lesions are appreciable on this study. No adenopathy is evident in the abdomen or pelvis. Reproductive: Uterus is anteverted. There is an  apparent leiomyoma extending from the leftward aspect of the uterus anteriorly measuring 4.3 x 4.0 cm. No extrauterine pelvic mass evident. Other: Appendix appears normal. No abscess is seen in the abdomen or pelvis. No free pelvic fluid seen. Loculated fluid adjacent to the pancreas is noted. Musculoskeletal: There is degenerative change in the lumbar spine. There are no blastic or lytic bone lesions. There is no intramuscular or abdominal wall lesion evident. IMPRESSION: 1. Acute pancreatitis with pancreatic edema and peripancreatic fluid, most notably anterior to the body and tail of the pancreas. No well-defined pseudocyst or noncystic mass. No pancreatic duct dilatation. 2. Areas of gastritis and duodenitis due to adjacent pancreatitis causing inflammation in these areas. No bowel obstruction. No abscess. Appendix appears normal. 3. 1 mm nonobstructing calculus upper pole left kidney. No hydronephrosis or ureteral  calculus on either side. 4.  Hepatic steatosis. 5. 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 Electronically Signed   By: Bretta Bang III M.D.   On: 11/15/2017 12:04     LOS: 21 days   Signature  Susa Raring M.D on 11/24/2017 at 10:26 AM  Between 7am to 7pm - Pager - [phone] ( page via [email or web address], text pages only, please mention full 10 digit call back number).  After 7pm go to [email or web address] - password [facility]

## 2017-11-24 NOTE — Progress Notes (Signed)
PHARMACY - ADULT TOTAL PARENTERAL NUTRITION CONSULT NOTE   Pharmacy Consult for TPN Indication: severe pancreatitis  Patient Measurements: Height: '5\' 10"'  (177.8 cm) Weight: (!) 302 lb 7.5 oz (137.2 kg) IBW/kg (Calculated) : 68.5 TPN AdjBW (KG): 85.3 Body mass index is 43.4 kg/m.  Insulin Requirements:  - 13 units Moderate Novolog SSI / 24hrs - 90 units regular insulin in TPN - CL diet likely increases CBGs (meal intake not charted)  Current Nutrition: Back to CL diet 1/30, TPN not yet at goal Boost Breeze BID, each supplement provides 250 kcal and 9 grams of protein.  Pt refused one on 1/31 and refused one on 2/1, but drank both doses scheduled on 2/2.  Refused both doses on 2/3.    IVF: LR at 75m/hr  Central access: R IJ HD catheter TPN start date: 1/24  ASSESSMENT                                                                                                          HPI: 57y.o. Female with gallstone pancreatitis with necrosis s/p MRCP on 1/15.  Repeat LFTs and lipase normalizing.  No plans for surgical intervention at this time; planning for delayed cholecystectomy per [facility].  Tube feeding was started but had to be stopped on 1/22 due to nausea/vomiting.  Pharmacy is consulted to begin TPN. Patient's course has been complicated with respiratory failure and intubation and subsequent extubation on 11/09/2017.  She was also on CRRT from 1/18-1/23.  Significant events:  1/28: CL diet 1/29: FL diet 1/30: did not tolerate FLD, back to CLD, weight incr 305 > 314  2/2 Remains volume overloaded, but respiratory status is improved.  Avoiding lasix prior to contrast CT early next week.  Tolerating volume and OK to continue to advance towards goal rate per MD.  Today: 11/24/2017  Glucose: No hx DM. CBGs elevated since starting TPN but improved with addition of insulin to TPN (CBG 147-175).   Electrolytes: Phos , K, Na, CorrCa are WNL withOUT electrolytes in TPN.  Phos-Calcium product <55.  Mag is slightly low today (1.6).  Renal: AKI on CRRT 1/18-1/23. SCr remains elevated off CRRT, but very slowly improving.  Last lasix given 1/30 (I/O net -+800 on 2/3).  Weight unchanged.   LFTs: AST/ALT mildly elevated but stable, Alk phos elevated, Tbili WNL  TGs: 213 (1/15), 332 (1/18), 276 (1/28) 224 (2/4)- continues to improve  Prealbumin: 6.7 (1/25), 9.3 (1/28), 7.1 (2/4)  NUTRITIONAL GOALS                                                                                           RD recs 1/31: 2270-2405 kcal, >/= 135 grams protein per  day  Clinimix 5/15 at a goal rate of 126m/hr + 20% fat emulsion at 250mhr to provide 132g/day protein, 2354Kcal/day.   PLAN                                                                                                                         At 1800 today:  Continue Clinimix 5/15 (NO electrolytes) at goal rate of 110 ml/hr today per discussion with MD.   Continue 20% lipid emulsion 20 ml/hr over 12 hours.  Monitor closely given elevated triglycerides, pancreatitis, and volume overload.  TPN to contain standard multivitamins and trace elements  Continue insulin in TPN, increase to 100 units regular insulin per 24 hr bag   Continue SSI q4h MODERATE scale  IVF per MD - per discussion with TRH on 2/3, He will add fluids or lasix if needed depending on fluid status pending urine osmolality, Cr, Na labs.  TPN lab panels on Mondays & Thursdays.   Consider daily labs to trend electrolytes without lytes in TPN.  F/u daily.   MiNetta CedarsPharmD, BCPS Pager: [phone]/01/2018 9:25 AM

## 2017-11-24 NOTE — Progress Notes (Signed)
PT Cancellation Note  Patient Details Name: Emma Washington MRN: [id number] DOB: December 15, 1960   Cancelled Treatment:    Reason Eval/Treat Not Completed: Patient declined, patient reports that she met with the doctor and her  Goals are to rest today. RN aware. Will check back another time.   Claretha Cooper 11/24/2017, 2:34 PM  Tresa Endo PT [phone]

## 2017-11-25 ENCOUNTER — Inpatient Hospital Stay (HOSPITAL_COMMUNITY): Payer: BLUE CROSS/BLUE SHIELD

## 2017-11-25 DIAGNOSIS — K8500 Idiopathic acute pancreatitis without necrosis or infection: Secondary | ICD-10-CM

## 2017-11-25 DIAGNOSIS — K851 Biliary acute pancreatitis without necrosis or infection: Secondary | ICD-10-CM

## 2017-11-25 LAB — GLUCOSE, CAPILLARY
GLUCOSE-CAPILLARY: 126 mg/dL — AB (ref 65–99)
GLUCOSE-CAPILLARY: 144 mg/dL — AB (ref 65–99)
GLUCOSE-CAPILLARY: 149 mg/dL — AB (ref 65–99)
Glucose-Capillary: 144 mg/dL — ABNORMAL HIGH (ref 65–99)
Glucose-Capillary: 145 mg/dL — ABNORMAL HIGH (ref 65–99)
Glucose-Capillary: 131 mg/dL — ABNORMAL HIGH (ref 65–99)

## 2017-11-25 LAB — PHOSPHORUS: PHOSPHORUS: 4.1 mg/dL (ref 2.5–4.6)

## 2017-11-25 LAB — BASIC METABOLIC PANEL
Anion gap: 7 (ref 5–15)
BUN: 67 mg/dL — ABNORMAL HIGH (ref 6–20)
CHLORIDE: 108 mmol/L (ref 101–111)
CO2: 21 mmol/L — ABNORMAL LOW (ref 22–32)
Calcium: 8.2 mg/dL — ABNORMAL LOW (ref 8.9–10.3)
Creatinine, Ser: 1.67 mg/dL — ABNORMAL HIGH (ref 0.44–1.00)
GFR calc Af Amer: 39 mL/min — ABNORMAL LOW (ref >60)
GFR calc non Af Amer: 33 mL/min — ABNORMAL LOW (ref >60)
Glucose, Bld: 160 mg/dL — ABNORMAL HIGH (ref 65–99)
Potassium: 3.5 mmol/L (ref 3.5–5.1)
SODIUM: 136 mmol/L (ref 135–145)

## 2017-11-25 LAB — URINALYSIS, ROUTINE W REFLEX MICROSCOPIC
BILIRUBIN URINE: NEGATIVE
Glucose, UA: NEGATIVE mg/dL
KETONES UR: NEGATIVE mg/dL
Nitrite: NEGATIVE
Protein, ur: NEGATIVE mg/dL
SPECIFIC GRAVITY, URINE: 1.013 (ref 1.005–1.030)
pH: 6 (ref 5.0–8.0)

## 2017-11-25 LAB — CBC
HCT: 26.4 % — ABNORMAL LOW (ref 36.0–46.0)
HEMOGLOBIN: 7.1 g/dL — AB (ref 12.0–15.0)
MCH: 29.5 pg (ref 26.0–34.0)
MCHC: 26.9 g/dL — ABNORMAL LOW (ref 30.0–36.0)
MCV: 109.5 fL — ABNORMAL HIGH (ref 78.0–100.0)
Platelets: 427 10*3/uL — ABNORMAL HIGH (ref 150–400)
RBC: 2.41 MIL/uL — ABNORMAL LOW (ref 3.87–5.11)
RDW: 17.1 % — ABNORMAL HIGH (ref 11.5–15.5)
WBC: 22.6 10*3/uL — ABNORMAL HIGH (ref 4.0–10.5)

## 2017-11-25 LAB — MAGNESIUM: MAGNESIUM: 2 mg/dL (ref 1.7–2.4)

## 2017-11-25 MED ORDER — FLUCONAZOLE IN SODIUM CHLORIDE 200-0.9 MG/100ML-% IV SOLN
200.0000 mg | Freq: Once | INTRAVENOUS | Status: AC
  Administered 2017-11-25: 200 mg via INTRAVENOUS
  Filled 2017-11-25: qty 100

## 2017-11-25 MED ORDER — TRACE MINERALS CR-CU-MN-SE-ZN 10-1000-500-60 MCG/ML IV SOLN
INTRAVENOUS | Status: AC
  Administered 2017-11-25: 18:00:00 via INTRAVENOUS
  Filled 2017-11-25: qty 2000

## 2017-11-25 MED ORDER — ONDANSETRON HCL 4 MG/2ML IJ SOLN: 4.0000 mg | Freq: Four times a day (QID) | INTRAMUSCULAR | Status: DC | PRN

## 2017-11-25 MED ORDER — FAT EMULSION 20 % IV EMUL
240.0000 mL | INTRAVENOUS | Status: AC
  Administered 2017-11-25: 240 mL via INTRAVENOUS
  Filled 2017-11-25: qty 250

## 2017-11-25 MED ORDER — ACETAMINOPHEN 500 MG PO TABS: 1000.0000 mg | ORAL_TABLET | ORAL | Status: DC | PRN

## 2017-11-25 MED ORDER — IOPAMIDOL (ISOVUE-300) INJECTION 61%
INTRAVENOUS | Status: AC
  Filled 2017-11-25: qty 30

## 2017-11-25 MED ORDER — IOPAMIDOL (ISOVUE-300) INJECTION 61%: 15.0000 mL | Freq: Once | INTRAVENOUS | Status: AC | PRN

## 2017-11-25 MED ORDER — UNJURY CHICKEN SOUP POWDER
8.0000 [oz_av] | Freq: Every day | ORAL | Status: DC
  Filled 2017-11-25 (×2): qty 27

## 2017-11-25 MED ORDER — FENTANYL 40 MCG/ML IV SOLN
INTRAVENOUS | Status: DC
  Administered 2017-11-25: 127.5 ug via INTRAVENOUS
  Administered 2017-11-25: 108.7 ug via INTRAVENOUS
  Administered 2017-11-26: 25.1 ug via INTRAVENOUS
  Administered 2017-11-26: 83.1 ug via INTRAVENOUS
  Administered 2017-11-26: 39.17 ug via INTRAVENOUS
  Filled 2017-11-25: qty 25

## 2017-11-25 MED ORDER — FUROSEMIDE 40 MG PO TABS
60.0000 mg | ORAL_TABLET | Freq: Once | ORAL | Status: AC
  Administered 2017-11-25: 16:00:00 60 mg via ORAL
  Filled 2017-11-25: qty 1

## 2017-11-25 MED ORDER — POTASSIUM CHLORIDE 20 MEQ/15ML (10%) PO SOLN
40.0000 meq | Freq: Once | ORAL | Status: AC
  Administered 2017-11-25: 40 meq via ORAL
  Filled 2017-11-25: qty 30

## 2017-11-25 MED ORDER — LIDOCAINE 5 % EX PTCH
2.0000 | MEDICATED_PATCH | CUTANEOUS | Status: DC
  Administered 2017-11-25 – 2017-12-04 (×6): 2 via TRANSDERMAL
  Filled 2017-11-25 (×11): qty 2

## 2017-11-25 MED ORDER — FLUCONAZOLE 100MG IVPB
100.0000 mg | INTRAVENOUS | Status: DC
  Administered 2017-11-26 – 2017-11-27 (×2): 100 mg via INTRAVENOUS
  Filled 2017-11-25 (×3): qty 50

## 2017-11-25 NOTE — Progress Notes (Addendum)
PHARMACY - ADULT TOTAL PARENTERAL NUTRITION CONSULT NOTE   Pharmacy Consult for TPN Indication: severe pancreatitis  Patient Measurements: Height: _0  (177.8 cm) Weight: (!) 312 lb 2.7 oz (141.6 kg) IBW/kg (Calculated) : 68.5 TPN AdjBW (KG): 85.3 Body mass index is 44.79 kg/m.  Insulin Requirements:  - 13 units Moderate Novolog SSI / 24hrs - 100 units regular insulin in TPN  Current Nutrition: Back to CL diet 1/30, TPN not yet at goal Boost Breeze BID, each supplement provides 250 kcal and 9 grams of protein.  Pt refused both doses on 2/3 and both doses charted as given 2/4. Will f/u continued intake of these to determine if can back down on TPN rate.  IVF: off 2/4 pm  Central access: R IJ HD catheter TPN start date: 1/24  ASSESSMENT                                                                                                          HPI: 57 y.o. Female with gallstone pancreatitis with necrosis s/p MRCP on 1/15.  Repeat LFTs and lipase normalizing.  No plans for surgical intervention at this time; planning for delayed cholecystectomy per [facility].  Tube feeding was started but had to be stopped on 1/22 due to nausea/vomiting.  Pharmacy is consulted to begin TPN. Patient's course has been complicated with respiratory failure and intubation and subsequent extubation on 11/09/2017.  She was also on CRRT from 1/18-1/23.  Significant events:  1/28: CL diet 1/29: FL diet 1/30: did not tolerate FLD, back to CLD, weight incr 305 > 314  2/2 Remains volume overloaded, but respiratory status is improved.  Avoiding lasix prior to contrast CT early next week.  Tolerating volume and OK to continue to advance towards goal rate per MD.  Today: 11/25/2017  Glucose: No hx DM. CBGs elevated since starting TPN but improved with addition of insulin to TPN (CBG 141-145).  Also, getting Boost Breeze supplements occasionally.  Requiring high amount of insulin in TPN to keep CBGs  controlled.  Electrolytes: Phos, Mag, K, Na, CorrCa are WNL withOUT electrolytes in TPN.  Phos-Calcium product <55. KCl 40 mEq x 1 ordered by MD for K 3.5 (at lower end of LN).  Renal: AKI on CRRT 1/18-1/23. SCr remains elevated off CRRT with very slow improvement.  Last lasix given 1/30.  I/O net +2.8L yesterday, weight increased today again.   LFTs: AST/ALT mildly elevated, Alk phos elevated, Tbili WNL  TGs: 213 (1/15), 332 (1/18), 276 (1/28) 224 (2/4)- continues to improve  Prealbumin: 6.7 (1/25), 9.3 (1/28), 7.1 (2/4)  NUTRITIONAL GOALS  RD recs 1/31: 2270-2405 kcal, >/= 135 grams protein per day  Clinimix 5/15 at a goal rate of 120m/hr + 20% fat emulsion at 268mhr to provide 132g/day protein, 2354Kcal/day.   PLAN                                                                                                                         At 1800 today:  Continue Clinimix 5/15 (NO electrolytes) at goal rate of 110 ml/hr.  Continue 20% lipid emulsion 20 ml/hr over 12 hours.  Monitor closely given elevated triglycerides, pancreatitis, and volume overload.  TPN to contain standard multivitamins and trace elements  Continue insulin in TPN, continue to provide 100 units regular insulin per 24 hr bag.   Continue SSI q4h MODERATE scale  IVF per MD (currently off) - per discussion with TRH on 2/3, He will add fluids or lasix if needed depending on fluid status pending urine osmolality, Cr, Na labs.  TPN lab panels on Mondays & Thursdays.   Consider daily labs to trend electrolytes without lytes in TPN.  F/u daily.   AmHershal CoriaPharmD, BCPS Pager: [phone]/02/2018 8:49 AM

## 2017-11-25 NOTE — Progress Notes (Signed)
Unfortunately Emma Washington looks worse today although she tells me she had the best night of sleep since being in the hospital after receiving the zyprexa last PM. She is extremely dyspneic and tachypnic. Her CT of her abdomen confirms interval worsening of her necrotizing pancreatitis with pseudocyst formation and significantly worse ascites.Her CXR shows pulmonary edema and bilateral effusions. Her oral mucosa is inflammed and crusted- appears thrush like. Her pain continues to escalate.  Recommendations:  1. Fentanyl PCA- started a basal dose of 10mcg with a bolus of 25mcg q15 with 100mcg lockout/hr  2. Lidoderm patch at area of burning abdominal pain.  3. Continue PM zyprexa, Tylenol PRN  4. Diurese as tolerated.  5. Large amount of ascites and inflammation in her abdomen-interval worsening- discussed with Attending - IR for drains and options.  6. For thrush will have pharmacy dose Fluconazole (renal dosing needed) for oral/esophagel thrush-continue also with magic mouthwash.  I discussed the results of her CT and CXR with her and answered her questions. Emma Washington has a long road ahead and I am concerned about her profound deconditioning and the toll of prolonged critical illness.  Time: 35 min Greater than 50%  of this time was spent counseling and coordinating care related to the above assessment and plan.

## 2017-11-25 NOTE — Progress Notes (Signed)
Nutrition Follow-up  DOCUMENTATION CODES:   Morbid obesity  INTERVENTION:  - Continue TPN per Pharmacy. - Continue Boost Breeze BID and continue to encourage PO intakes. - Will trial Unjury Chicken Soup once/day, this 8 ounce serving provides 100 kcal and 21 grams of protein. - Diet advancement as medically feasible.  NUTRITION DIAGNOSIS:   Inadequate oral intake related to inability to eat as evidenced by NPO status. -CLD, does not meet estimated needs.   GOAL:   Patient will meet greater than or equal to 90% of their needs -met with TPN alone.   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 Other associated symptoms include nausea, vomiting and decreased PO intake (since 1/13). Pertinent PMHx significant for nephrolithiasis.   Significant Events: 1/15- MRCP 1/18- intubated and plan for OGT placement and trickle TF  1/19- CRRT started 1/20- extubated and OGT removed 1/21- Panda placed in R nare 1/22- TF stopped d/t N/V 1/23- plan to hold CRRT x1-2 days and assess for renal recovery 1/24- TPN initiation; ILE to be held x7 days 1/25: Pt pulled NGT, refused replacement 1/31: Estimated nutrition needs adjusted 2/2: Advancement in TPN rate from 60 mL/hr to 83 mL/hr 2/3 Advancement in TPN rate to goal rate of 110 mL/hr.   Pt with double lumen PICC and is receiving TPN at goal rate: Clinimix 5/15 @ 100 mL/hr with 20% ILE @ 20 mL/hr. This regimen is providing 132 grams of protein and 2354 kcal to meet 98% minimum estimated protein need and 100% estimated kcal need. Plan to continue this regimen today.   Per Dr. Johney Maine' note yesterday evening, pt continues with severe pancreatitis with either sludge or gallstones present. Per Surgery PA note yesterday afternoon "can repeat a constrasted CT sometime this week."    Weight has been fluctuating throughout admission and since increase in TPN rate. Will continue to monitor weight trends closely. Pt has been accepting Boost Breeze ~50% of the time it is offered.  Medication reviewed; 100 mg Colace BID, sliding scale Novolog, 2 g IV Mg sulfate x1 run yesterday, 10 mg IV Reglan TID, 40 mg IV Protonix BID, 40 mEq oral KCl x1 dose today.  Labs reviewed; CBGs: 144 mg/dL x2 this AM, BUN: 67 mg/dL, creatinine: 1.67 mg/dL, Ca: 8.2 mg/dL, GFR: 33 mL/hr.        Diet Order:  Diet clear liquid Room service appropriate? Yes; Fluid consistency: Thin TPN (CLINIMIX) Adult without lytes TPN (CLINIMIX) Adult without lytes  EDUCATION NEEDS:   No education needs have been identified at this time  Skin:  Skin Assessment: Reviewed RN Assessment  Last BM:  2/4  Height:   Ht Readings from Last 1 Encounters:  11/19/17 '5\' 10"'  (1.778 m)    Weight:   Wt Readings from Last 1 Encounters:  11/25/17 (!) 312 lb 2.7 oz (141.6 kg)    Ideal Body Weight:  65.9 kg  BMI:  Body mass index is 44.79 kg/m.  Estimated Nutritional Needs:   Kcal:  2947-6546   Protein:  >/= 135 grams  Fluid:  >/= 1.8 L/day     Jarome Matin, MS, RD, LDN, [facility] Inpatient Clinical Dietitian Pager # [phone] After hours/weekend pager # [phone]

## 2017-11-25 NOTE — Progress Notes (Addendum)
Pt has increased SOB, notable increase in WOB, audible wheezing, RR 35-45. RN notified on call Provider, and Pt was placed back on Bipap by RT.

## 2017-11-25 NOTE — Progress Notes (Signed)
PROGRESS NOTE        PATIENT DETAILS Name: Emma Washington Age: 57 y.o. Sex: female Date of Birth: 10/10/1961 Admit Date: 11/02/2017 Admitting Physician Albertine Grates, MD PCP:No primary care provider on file.  Brief Narrative:  Patient is a 57 y.o. 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  She briefly required CRRT, subsequently extubated and transferred to try to hospitalist service.  Although improved-hospital course was complicated by respiratory distress secondary to volume overload/pulmonary edema on 1/30 requiring transfer to stepdown unit, worsening abdominal pain requiring initiation of fentanyl PCA on 2/1.  After discussion with general surgery/GI-plans are to proceed with a repeat CT scan of the abdomen sometime next week-hopefully with IV contrast if her renal function allows.  See below for further details  Subjective: Patient in bed, appears comfortable, denies any headache, no fever, no chest pain or pressure, no shortness of breath , improved abdominal pain. No focal weakness.   Assessment/Plan:  Severe gallstone pancreatitis: Had evidence of CBD stone, GI general surgery on board, no plans for ERCP, CBD stone has likely passed, overall improved-however a few days back was unable to tolerate advancement to full liquids.  She remains on clear liquids and on TNA.  She continued to have significant abdominal pain requiring PCA and continues to have significant nonspecific leukocytosis.  Discussed the case with general surgeon Dr. Michaell Cowing on 11/25/2017, they will withhold on further imaging, they want to continue present level of care, hold off antibiotics, will eventually need Cholecystectomy.   Defer further management to GI and general surgery.  If further imaging needed will defer to GI and general  surgery.  Continue supportive care with bowel rest, IV TNA for nutrition, pain control, currently off antibiotics, repeat scan per general surgery and GI if needed.  Discussed with the patient future plan of care in case there is not much significant improvement in the coming future, she will start thinking about other modalities of care including palliative care.  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  Palliative care team here has self consulted on the patient on 11/23/2017 upon patient request.   Acute kidney injury: Previous baseline patient states that she has not been to her doctor's office for blood draw for several years, she could have underlying CKD stage III, creatinine seems to have plateaued around 1.6.  We will continue to monitor.  Acute hypoxic respiratory failure: She was intubated from 1/18-1/20.  She was medically stable on nasal cannula-however on 1/30 she developed worsening hypoxia and respiratory distress thought to be due to volume overload with some components from anxiety and pain.  After diuresis with Lasix and BiPAP shortness of breath has improved on 01/07/2018, continue to monitor clinically.  Anemia of critical illness: Hemoglobin currently stable-no indication for transfusion at this time  Morbid obesity  Suspected OHS/OSA: Outpatient sleep study-BiPAP nightly if she tolerates.  Leukocytosis.  Likely due to #1 above will get a two-view chest x-ray and repeat UA on 11/25/2017 to rule out other sources.  She is afebrile.  No reported diarrhea.    DVT Prophylaxis: Prophylactic Heparin  Code Status: Full code  Family Communication: None at bedside  Disposition Plan: In stepdown encouraged to increase activity, now working with PT, if able to sit  in the chair for a few hours move out of stepdown  Antimicrobial agents: Anti-infectives (From admission, onward)   Start     Dose/Rate Route Frequency Ordered Stop   11/12/17 1800  meropenem (MERREM) 1 g in sodium chloride  0.9 % 100 mL IVPB  Status:  Discontinued     1 g 200 mL/hr over 30 Minutes Intravenous Every 8 hours 11/12/17 1115 11/17/17 0954   11/11/17 1000  meropenem (MERREM) 1 g in sodium chloride 0.9 % 100 mL IVPB  Status:  Discontinued     1 g 200 mL/hr over 30 Minutes Intravenous Every 12 hours 11/11/17 0911 11/12/17 1115   11/09/17 1000  piperacillin-tazobactam (ZOSYN) IVPB 3.375 g  Status:  Discontinued     3.375 g 12.5 mL/hr over 240 Minutes Intravenous Every 8 hours 11/09/17 0614 11/09/17 0615   11/09/17 1000  piperacillin-tazobactam (ZOSYN) IVPB 3.375 g  Status:  Discontinued     3.375 g 100 mL/hr over 30 Minutes Intravenous Every 6 hours 11/09/17 0616 11/11/17 0911   11/07/17 2200  piperacillin-tazobactam (ZOSYN) IVPB 2.25 g  Status:  Discontinued     2.25 g 100 mL/hr over 30 Minutes Intravenous Every 8 hours 11/07/17 1218 11/07/17 1741   11/07/17 2200  piperacillin-tazobactam (ZOSYN) IVPB 3.375 g  Status:  Discontinued     3.375 g 12.5 mL/hr over 240 Minutes Intravenous Every 6 hours 11/07/17 1741 11/07/17 1747   11/07/17 2200  piperacillin-tazobactam (ZOSYN) IVPB 3.375 g  Status:  Discontinued     3.375 g 100 mL/hr over 30 Minutes Intravenous Every 6 hours 11/07/17 1747 11/09/17 0613   11/04/17 1100  piperacillin-tazobactam (ZOSYN) IVPB 3.375 g  Status:  Discontinued     3.375 g 12.5 mL/hr over 240 Minutes Intravenous Every 8 hours 11/04/17 1059 11/07/17 1218   11-21-2017 1200  piperacillin-tazobactam (ZOSYN) IVPB 3.375 g     3.375 g 100 mL/hr over 30 Minutes Intravenous  Once 11-21-17 1157 11-21-2017 1309      Procedures: ETT 1/18 >> 1/20  CONSULTS:  pulmonary/intensive care, GI and general surgery  Time spent: 25 minutes -Greater than 50% of this time was spent in counseling, explanation of diagnosis, planning of further management, and coordination of care.  MEDICATIONS: Scheduled Meds: . acetaminophen  1,000 mg Oral TID  . chlorhexidine  15 mL Mouth Rinse BID  .  Chlorhexidine Gluconate Cloth  6 each Topical Daily  . docusate sodium  100 mg Oral BID  . feeding supplement  1 Container Oral BID BM  . fentaNYL   Intravenous Q4H  . heparin injection (subcutaneous)  5,000 Units Subcutaneous Q8H  . insulin aspart  0-15 Units Subcutaneous Q4H  . magic mouthwash  10 mL Oral TID  . mouth rinse  15 mL Mouth Rinse q12n4p  . metoCLOPramide (REGLAN) injection  10 mg Intravenous Q8H  . OLANZapine zydis  5 mg Oral QHS  . pantoprazole (PROTONIX) IV  40 mg Intravenous Q12H  . protein supplement  8 oz Oral Daily  . sodium chloride flush  10-40 mL Intracatheter Q12H   Continuous Infusions: . TPN (CLINIMIX) Adult without lytes     And  . fat emulsion    . TPN (CLINIMIX) Adult without lytes 110 mL/hr at 11/25/17 0533   PRN Meds:.acetaminophen, albuterol, ALPRAZolam, diphenhydrAMINE **OR** diphenhydrAMINE, lip balm, naLOXone (NARCAN)  injection, naloxone **AND** sodium chloride flush, polyethylene glycol, sodium chloride flush   PHYSICAL EXAM:  Vital signs: Vitals:   11/25/17 0526 11/25/17 0800 11/25/17 0820 11/25/17  1211  BP:  129/71    Pulse:  98    Resp: (!) 31 (!) 31 (!) 30 (!) 35  Temp:  98.5 F (36.9 C)    TempSrc:  Oral    SpO2: 97% 95% 98% 94%  Weight:      Height:       Filed Weights   11/23/17 0422 11/24/17 0500 11/25/17 0500  Weight: (!) 137.3 kg (302 lb 11.1 oz) (!) 137.2 kg (302 lb 7.5 oz) (!) 141.6 kg (312 lb 2.7 oz)   Body mass index is 44.79 kg/m.   Exam   Awake Alert, Oriented X 3, No new F.N deficits, Normal affect [place].AT,PERRAL Supple Neck,No JVD, No cervical lymphadenopathy appriciated.  Symmetrical Chest wall movement, Good air movement bilaterally, CTAB RRR,No Gallops, Rubs or new Murmurs, No Parasternal Heave +ve B.Sounds, abdomen is distended with mild epigastric tenderness, No organomegaly appriciated, No rebound - guarding or rigidity. No Cyanosis, Clubbing , trace edema, No new Rash or bruise    I have personally  reviewed following labs and imaging studies  LABORATORY DATA: CBC: Recent Labs  Lab 11/19/17 0402 11/23/17 0400 11/24/17 0446 11/25/17 0825  WBC 18.7* 15.4* 17.7* 22.6*  NEUTROABS  --   --  15.9*  --   HGB 8.1* 8.0* 8.0* 7.1*  HCT 25.5* 24.9* 24.4* 26.4*  MCV 94.1 92.6 91.7 109.5*  PLT 470* 398 406* 427*    Basic Metabolic Panel: Recent Labs  Lab 11/21/17 0554 11/22/17 0827 11/23/17 0400 11/24/17 0446 11/25/17 0500  NA 139 138 139 138 136  K 3.7 3.9 3.7 3.6 3.5  CL 110 108 109 112* 108  CO2 21* 21* 21* 20* 21*  GLUCOSE 146* 148* 162* 175* 160*  BUN 61* 60* 64* 62* 67*  CREATININE 1.82* 1.78* 1.73* 1.63* 1.67*  CALCIUM 8.0* 8.1* 8.2* 8.1* 8.2*  MG 1.6* 1.9 1.8 1.6* 2.0  PHOS 4.5 4.7* 4.2 3.7 4.1    GFR: Estimated Creatinine Clearance: 58.7 mL/min (A) (by C-G formula based on SCr of 1.67 mg/dL (H)).  Liver Function Tests: Recent Labs  Lab 11/19/17 0402 11/20/17 0448 11/24/17 0446  AST 83* 38 53*  ALT 89* 58* 66*  ALKPHOS 182* 160* 283*  BILITOT 0.6 0.4 0.3  PROT 5.8* 5.9* 5.8*  ALBUMIN 1.8* 1.9* 1.6*   No results for input(s): LIPASE, AMYLASE in the last 168 hours. No results for input(s): AMMONIA in the last 168 hours.  Coagulation Profile: No results for input(s): INR, PROTIME in the last 168 hours.  Cardiac Enzymes: No results for input(s): CKTOTAL, CKMB, CKMBINDEX, TROPONINI in the last 168 hours.  BNP (last 3 results) No results for input(s): PROBNP in the last 8760 hours.  HbA1C: No results for input(s): HGBA1C in the last 72 hours.  CBG: Recent Labs  Lab 11/24/17 1931 11/24/17 2348 11/25/17 0333 11/25/17 0753 11/25/17 1147  GLUCAP 141* 145* 144* 144* 145*    Lipid Profile: Recent Labs    11/24/17 0446  TRIG 224*    Thyroid Function Tests: No results for input(s): TSH, T4TOTAL, FREET4, T3FREE, THYROIDAB in the last 72 hours.  Anemia Panel: No results for input(s): VITAMINB12, FOLATE, FERRITIN, TIBC, IRON, RETICCTPCT in the  last 72 hours.  Urine analysis:    Component Value Date/Time   COLORURINE AMBER (A) 11/06/2017 1245   APPEARANCEUR CLOUDY (A) 11/06/2017 1245   LABSPEC 1.018 11/06/2017 1245   PHURINE 5.0 11/06/2017 1245   GLUCOSEU 50 (A) 11/06/2017 1245   HGBUR LARGE (A)  11/06/2017 1245   BILIRUBINUR NEGATIVE 11/06/2017 1245   KETONESUR NEGATIVE 11/06/2017 1245   PROTEINUR 30 (A) 11/06/2017 1245   NITRITE NEGATIVE 11/06/2017 1245   LEUKOCYTESUR NEGATIVE 11/06/2017 1245    Sepsis Labs: Lactic Acid, Venous No results found for: LATICACIDVEN  MICROBIOLOGY: No results found for this or any previous visit (from the past 240 hour(s)).  RADIOLOGY STUDIES/RESULTS: Ct Abdomen Pelvis Wo Contrast  Result Date: 11/11/2017 CLINICAL DATA:  57 year old female with a history of necrotizing pancreatitis now with worsening leukocytosis. Evaluate for evidence of superinfection. EXAM: CT ABDOMEN AND PELVIS WITHOUT CONTRAST TECHNIQUE: Multidetector CT imaging of the abdomen and pelvis was performed following the standard protocol without IV contrast. COMPARISON:  Prior CT scan of the abdomen and pelvis 10/25/2017 FINDINGS: Lower chest: Small bilateral pleural effusions and associated lower lobe atelectasis. The intracardiac blood pool is hypodense relative to the adjacent myocardium consistent with anemia. No pericardial effusion. Unremarkable distal thoracic esophagus. Hepatobiliary: Severe hypoattenuation of the hepatic parenchyma consistent with hepatic steatosis. There is some sparing around the gallbladder fossa. High attenuation material within the gallbladder consistent with sludge and/or small stones. No intra or extrahepatic biliary ductal dilatation. Pancreas: The pancreas is diffusely edematous. No discrete mass or pseudocyst. Inflammatory stranding extends throughout the peripancreatic soft tissues and into the mesenteric root. The degree of peripancreatic inflammatory stranding has significantly progressed  compared to 11/17/2017. Evaluation is limited in the absence of intravenous contrast. Spleen: Normal in size without focal abnormality. Adrenals/Urinary Tract: Adrenal glands are unremarkable. Kidneys are normal, without renal calculi, focal lesion, or hydronephrosis. Foley catheter in the bladder. Stomach/Bowel: No evidence of obstruction or focal bowel wall thickening. Normal appendix in the right lower quadrant. The terminal ileum is unremarkable. Vascular/Lymphatic: Limited evaluation in the absence of intravenous contrast. No aneurysm, significant atherosclerotic calcifications or suspicious lymphadenopathy. Reproductive: Uterus and bilateral adnexa are unremarkable. Other: Progressive inflammatory stranding throughout the mesenteric root and peripancreatic soft tissues. Small volume ascites along the greater curvature of the stomach, in the left perisplenic space and extending inferiorly along the left greater than right colic gutters to layer in the anatomic pelvis. No loculation or intra fluid gas to suggest infection. Musculoskeletal: No acute fracture or aggressive appearing lytic or blastic osseous lesion. IMPRESSION: 1. Progressive severe pancreatitis and secondary inflammatory changes throughout the peripancreatic soft tissues and mesenteric root. There is now small volume ascites which is likely reactive in nature. No loculation or intra fluid gas to suggest infection. 2. Small bilateral pleural effusions and associated atelectasis. 3. The intracardiac blood pool is hypodense relative to the adjacent myocardium consistent with anemia. 4. At least moderate hepatic steatosis. 5. Cholelithiasis. Electronically Signed   By: Malachy Moan M.D.   On: 11/11/2017 14:57   Dg Abd 1 View  Result Date: 11/10/2017 CLINICAL DATA:  NG tube placement Best obtainable notes due to patients size, pain and altered mental status. I could not get her to stop rocking and talking EXAM: ABDOMEN - 1 VIEW COMPARISON:   Earlier film of the same day FINDINGS: Weighted enteric tube tip is in the gastric body as before. Normal bowel gas pattern. The lower abdomen is is excluded. IMPRESSION: 1. Stable placement of feeding tube tip in the gastric body. Electronically Signed   By: Corlis Leak M.D.   On: 11/10/2017 19:29   Dg Abd 1 View  Result Date: 11/07/2017 CLINICAL DATA:  Orogastric tube placement. EXAM: ABDOMEN - 1 VIEW COMPARISON:  None. FINDINGS: Orogastric tube extends into the stomach.  Bowel gas pattern shows probable ileus. IMPRESSION: Orogastric tube extends into the stomach. Electronically Signed   By: Irish Lack M.D.   On: 11/07/2017 15:23   US Abdomen Complete  Result Date: 11/07/2017 CLINICAL DATA:  Epigastric and right upper quadrant discomfort, syncope, vomiting, hematuria. EXAM: ABDOMEN ULTRASOUND COMPLETE COMPARISON:  Abdominal and pelvic CT scan of today's date FINDINGS: Gallbladder: Probable small stones. No gallbladder wall thickening or pericholecystic fluid or positive sonographic Murphy's sign. Common bile duct: Diameter: 6.7 mm Liver: Visualization of the hepatic parenchyma is limited by bowel poor penetration of the ultrasound beam. The echotexture is subjectively increased. There is no focal mass or ductal dilation. Portal vein is patent on color Doppler imaging with normal direction of blood flow towards the liver. IVC: No abnormality visualized. Pancreas: Visualization of the pancreas is limited due to bowel gas. Spleen: Size and appearance within normal limits. Right Kidney: Length: 13.2 cm. Echogenicity within normal limits. No mass or hydronephrosis visualized. Left Kidney: Length: 11.9 cm. Echogenicity within normal limits. No mass or hydronephrosis visualized. Abdominal aorta: Bowel gas limits evaluation of the abdominal aorta. Other findings: No significant ascites is observed. IMPRESSION: The study is limited overall due to bowel gas and the patient's body habitus. Multiple tiny gallstones  are suspected. There is mild prominence of the common bile duct without definite intraluminal stones. Increased hepatic echotexture compatible with fatty infiltrative change. Poor evaluation of the pancreas due to bowel gas and surrounding inflammatory changes. Electronically Signed   By: David  [place] M.D.   On: 11/02/2017 14:02   US Renal  Result Date: 11/06/2017 CLINICAL DATA:  Acute kidney injury. EXAM: RENAL / URINARY TRACT ULTRASOUND COMPLETE COMPARISON:  None. FINDINGS: Right Kidney: Length: 12.8 cm. Echogenicity within normal limits. No mass or hydronephrosis visualized. Left Kidney: Length: 11.1 cm. Echogenicity within normal limits. No mass or hydronephrosis visualized. Bladder: Not visualize. Other: Small volume pelvic ascites. IMPRESSION: 1. Normal renal ultrasound. Electronically Signed   By: Elige Ko   On: 11/06/2017 15:15   Mr 3d Recon At Scanner  Result Date: 11/04/2017 CLINICAL DATA:  Pancreatitis with gallstones. EXAM: MRI ABDOMEN WITHOUT AND WITH CONTRAST (INCLUDING MRCP) TECHNIQUE: Multiplanar multisequence MR imaging of the abdomen was performed both before and after the administration of intravenous contrast. Heavily T2-weighted images of the biliary and pancreatic ducts were obtained, and three-dimensional MRCP images were rendered by post processing. CONTRAST:  20 cc MultiHance COMPARISON:  CT scan 11/03/2016 FINDINGS: Markedly motion degraded study. Lower chest: The heart is enlarged without substantial pericardial effusion. Hepatobiliary: Liver measures 20.8 cm and craniocaudal length, enlarged. Multiple gallstones are identified, measuring up to 7 mm diameter. Some stones or trapped behind a fold in the gallbladder fundus. MRCP images are markedly motion degraded and nondiagnostic for tiny common duct stones. Axial T2 weighted single shot imaging shows common duct measuring 7 mm diameter. Common bile duct in the head of the pancreas also measures 7 mm diameter. The axial T2  weighted sequences reveal some central flow artifact in the common duct although a very tiny dependent signal void is seen in the common bile duct on axial image 34 series 5 that maps to a tiny filling defect in the common bile duct seen on coronal MRCP image 34 of series 6. This appearance is compatible with a tiny common bile duct stone. Pancreas: . pancreas is diffusely edematous with peripancreatic edema tracking diffusely in the anterior pararenal space. Postcontrast imaging shows enhancement in the head of pancreas extreme pancreatic  tail but most of the body and tail of pancreas do not enhance. No focal or rim enhancing fluid collection identified in the retroperitoneal space. Spleen:  No splenomegaly. No focal mass lesion. Adrenals/Urinary Tract: No adrenal nodule or mass. Limited assessment of the kidney shows no hydronephrosis or gross mass lesion. Stomach/Bowel: Stomach is nondistended. No gastric wall thickening. No evidence of outlet obstruction. Duodenum is normally positioned as is the ligament of Treitz. No small bowel or colonic dilatation within the visualized abdomen. Vascular/Lymphatic: Portal vein and superior mesenteric vein are patent. Splenic vein is patent. Celiac axis and SMA are patent. Small retroperitoneal / peripancreatic lymph nodes noted. Other: Free fluid is noted around the liver and spleen. Mesenteric edema/fluid is evident. Musculoskeletal: No abnormal marrow enhancement within the visualized bony anatomy. IMPRESSION: 1. Markedly motion degraded study. 2. Cholelithiasis with borderline distention of the extrahepatic bile ducts. 3 mm filling defect in the common duct as it enters the head of the pancreas is consistent with with choledocholithiasis. 3. Nonenhancement involving much of the pancreatic body and tail parenchyma, highly suggestive of pancreatic necrosis. 4. Extensive edema/inflammation in the retroperitoneal space with intraperitoneal fluid and diffuse mesenteric edema  in the abdomen. No focal/organized or rim enhancing fluid collection at this time to suggest evolving pseudocyst or abscess. 5. Hepatomegaly. 6. Portal vein is patent. Electronically Signed   By: Kennith Center M.D.   On: 11/04/2017 08:12   Dg Chest Port 1 View  Result Date: 11/23/2017 CLINICAL DATA:  Shortness of breath EXAM: PORTABLE CHEST 1 VIEW COMPARISON:  11/19/2017 FINDINGS: Left lower lobe opacity, unchanged, likely reflecting a combination of atelectasis and pleural effusion. Right lung is essentially clear. Mild pulmonary vascular congestion. No frank interstitial edema. No pneumothorax. Cardiomegaly. Right subclavian catheter terminates at the cavoatrial junction. IMPRESSION: Left lower lobe opacity, unchanged, likely reflecting combination of atelectasis and pleural effusion. Pulmonary vascular congestion without frank interstitial edema. Electronically Signed   By: Charline Bills M.D.   On: 11/23/2017 09:04   Dg Chest Port 1 View  Result Date: 11/11/2017 CLINICAL DATA:  Pancreatitis.  Shortness of breath. EXAM: PORTABLE CHEST 1 VIEW COMPARISON:  11/09/2017. FINDINGS: Interim extubation. Interim placement of feeding scratched it interim removal of NG tube placement of feeding tube. Right IJ line in stable position. Cardiomegaly. Low lung volumes. Left lower lobe infiltrate suggesting pneumonia. Asymmetric pulmonary edema could also present this fashion. Small left pleural effusion. No pneumothorax. IMPRESSION: 1. Interim extubation and removal of NG tube. Interim placement of feeding tube, its tip is below left hemidiaphragm. Right IJ line stable position. 2. Low lung volumes with basilar atelectasis. Left lower lobe infiltrate/edema and small left pleural effusion. 3.  Stable cardiomegaly. Electronically Signed   By: Maisie Fus  Register   On: 11/11/2017 06:57   Dg Chest Port 1 View  Result Date: 11/09/2017 CLINICAL DATA:  Hypoxia EXAM: PORTABLE CHEST 1 VIEW COMPARISON:  November 08, 2017  FINDINGS: Endotracheal tube tip is 3.2 cm above the carina. Nasogastric tube tip and side port are below the diaphragm. Central catheter tip is in the superior vena cava. No pneumothorax. There is persistent atelectatic change in the left base with small left pleural effusion. There is mild right base atelectasis. Heart is upper normal in size with pulmonary vascularity within normal limits. No adenopathy. No bone lesions. IMPRESSION: Tube and catheter positions as described without pneumothorax. Persistent bibasilar atelectasis, more on the left than on the right, stable. No new opacity. Small left pleural effusion. Stable cardiac silhouette.  Electronically Signed   By: Bretta BangWilliam  Woodruff III M.D.   On: 11/09/2017 07:16   Dg Chest Port 1 View  Result Date: 11/08/2017 CLINICAL DATA:  Hypoxia EXAM: PORTABLE CHEST 1 VIEW COMPARISON:  November 07, 2017 FINDINGS: Endotracheal tube tip is 3.7 cm above the carina. Nasogastric tube tip and side port are below the diaphragm. Central catheter tip is in the superior vena cava. No pneumothorax. There is atelectatic change in both lung bases, more on the left than on the right, stable. There is a minimal pleural effusion on each side. Heart is mildly enlarged with pulmonary vascularity within normal limits. No adenopathy. No bone lesions. IMPRESSION: Tube and catheter positions as described without evident pneumothorax. Bibasilar atelectasis, more on the left than on the right, with small bilateral pleural effusions. Stable cardiac prominence. Electronically Signed   By: Bretta BangWilliam  Woodruff III M.D.   On: 11/08/2017 07:13   Dg Chest Port 1 View  Result Date: 11/07/2017 CLINICAL DATA:  Central line placement. EXAM: PORTABLE CHEST 1 VIEW COMPARISON:  0240 hours FINDINGS: Interval placement of right jugular non tunneled hemodialysis catheter with the catheter tip at the level of the mid SVC. No pneumothorax. Endotracheal tube remains present with the tip approximately 3.5 cm  above the carina. Gastric decompression tube extends into the stomach. Lungs show improved aeration bilaterally and decrease in pulmonary edema. Probable component of left pleural fluid. IMPRESSION: Non tunneled dialysis catheter tip in SVC. No pneumothorax identified after placement. Lungs show improved aeration with decrease in pulmonary edema. Electronically Signed   By: Irish LackGlenn  Yamagata M.D.   On: 11/07/2017 15:22   Dg Chest Port 1 View  Result Date: 11/07/2017 CLINICAL DATA:  57 y/o  F; post intubation, ET tube position. EXAM: PORTABLE CHEST 1 VIEW COMPARISON:  11/06/2017 chest radiograph FINDINGS: Low lung volumes. Stable enlarged cardiac silhouette given projection and technique. Increased diffuse hazy airspace opacities and small effusions probably representing pulmonary edema. Endotracheal tube is 1.4 cm from carina. No acute osseous abnormality identified. IMPRESSION: Endotracheal tube 1.4 cm from carina. Increased hazy lung opacities compatible with pulmonary edema and small effusions bilaterally. Electronically Signed   By: Mitzi HansenLance  Furusawa-Stratton M.D.   On: 11/07/2017 04:24   Dg Chest Port 1 View  Result Date: 11/06/2017 CLINICAL DATA:  Acute respiratory failure EXAM: PORTABLE CHEST 1 VIEW COMPARISON:  11/04/2017 FINDINGS: Hypoventilation with decreased lung volume. Increase in bibasilar atelectasis left greater than right. Probable left pleural effusion. Pulmonary vascular congestion suggesting mild heart failure. IMPRESSION: Hypoventilation with increased atelectasis in lung bases left greater than right. Small left effusion. Progression of pulmonary vascular congestion suggesting mild fluid overload. Electronically Signed   By: Marlan Palauharles  Clark M.D.   On: 11/06/2017 09:05   Dg Chest Port 1 View  Result Date: 11/04/2017 CLINICAL DATA:  Shortness of breath, weakness, and fever. EXAM: PORTABLE CHEST 1 VIEW COMPARISON:  Report from prior chest radiograph 10/19/2013 FINDINGS: The patient is  rotated to the right on today's radiograph, reducing diagnostic sensitivity and specificity. Mild enlargement of the cardiopericardial silhouette. Prominence of mediastinal contour is probably from fatty mediastinal tissues based on comparison to the recent abdomen MRI. Low lung volumes are present, causing crowding of the pulmonary vasculature. Indistinct airspace opacity at the left lung base compatible with atelectasis or pneumonia. Similar indistinct density at the right lung base medially. Rightward tracheal deviation, partly rotation related IMPRESSION: 1. Left greater than right airspace opacities in the lung bases favoring atelectasis or pneumonia. 2. Low lung volumes  are present, causing crowding of the pulmonary vasculature. 3. Mild enlargement of the cardiopericardial silhouette. Electronically Signed   By: Gaylyn Rong M.D.   On: 11/04/2017 14:27   Dg Chest Port 1v Same Day  Result Date: 11/19/2017 CLINICAL DATA:  Increase shortness of breath EXAM: PORTABLE CHEST 1 VIEW COMPARISON:  11/11/2017 FINDINGS: 0851 hours. Low lung volumes. The cardio pericardial silhouette is enlarged. There is pulmonary vascular congestion without overt pulmonary edema. Persistent left base collapse/consolidation, similar. Right PICC line tip overlies the distal SVC. The visualized bony structures of the thorax are intact. IMPRESSION: No substantial interval change. Cardiomegaly with vascular congestion and left base collapse/consolidation. Electronically Signed   By: Kennith Center M.D.   On: 11/19/2017 10:02   Dg Abd Decub  Result Date: 11/19/2017 CLINICAL DATA:  Ileus, abdominal distention. EXAM: ABDOMEN - 1 VIEW DECUBITUS COMPARISON:  11/13/2017 and CT abdomen pelvis 11/11/2017. FINDINGS: Two left lateral decubitus views of the abdomen show air-fluid levels in dilated bowel. No definite free air. Abdomen is incompletely imaged. IMPRESSION: Gaseous distention of bowel with air-fluid levels is compatible with  the given history of an ileus. Abdomen is incompletely imaged. Electronically Signed   By: Leanna Battles M.D.   On: 11/19/2017 12:02   Dg Abd Portable 1v  Result Date: 11/13/2017 CLINICAL DATA:  NG tube placement. EXAM: PORTABLE ABDOMEN - 1 VIEW COMPARISON:  No recent prior. FINDINGS: NG tube noted with tip coiled stomach. No bowel distention noted. Bibasilar atelectasis. No acute bony abnormality noted. IMPRESSION: NG tube noted with tip coiled in the stomach. Electronically Signed   By: Maisie Fus  Register   On: 11/13/2017 10:07   Dg Abd Portable 1v  Result Date: 11/10/2017 CLINICAL DATA:  Status post feeding tube placement. EXAM: PORTABLE ABDOMEN - 1 VIEW COMPARISON:  Abdominal radiograph of 07 November 2017 FINDINGS: The nasogastric tube has been removed and replaced with a radiodense tipped feeding tube. Thetube tip is in the mid gastric body. IMPRESSION: The tip of the feeding tube is in the mid gastric body. Electronically Signed   By: David  [place] M.D.   On: 11/10/2017 11:28   Mr Abdomen Mrcp Vivien Rossetti Contast  Result Date: 11/04/2017 CLINICAL DATA:  Pancreatitis with gallstones. EXAM: MRI ABDOMEN WITHOUT AND WITH CONTRAST (INCLUDING MRCP) TECHNIQUE: Multiplanar multisequence MR imaging of the abdomen was performed both before and after the administration of intravenous contrast. Heavily T2-weighted images of the biliary and pancreatic ducts were obtained, and three-dimensional MRCP images were rendered by post processing. CONTRAST:  20 cc MultiHance COMPARISON:  CT scan 11/03/2016 FINDINGS: Markedly motion degraded study. Lower chest: The heart is enlarged without substantial pericardial effusion. Hepatobiliary: Liver measures 20.8 cm and craniocaudal length, enlarged. Multiple gallstones are identified, measuring up to 7 mm diameter. Some stones or trapped behind a fold in the gallbladder fundus. MRCP images are markedly motion degraded and nondiagnostic for tiny common duct stones. Axial T2 weighted  single shot imaging shows common duct measuring 7 mm diameter. Common bile duct in the head of the pancreas also measures 7 mm diameter. The axial T2 weighted sequences reveal some central flow artifact in the common duct although a very tiny dependent signal void is seen in the common bile duct on axial image 34 series 5 that maps to a tiny filling defect in the common bile duct seen on coronal MRCP image 34 of series 6. This appearance is compatible with a tiny common bile duct stone. Pancreas: . pancreas is diffusely edematous with  peripancreatic edema tracking diffusely in the anterior pararenal space. Postcontrast imaging shows enhancement in the head of pancreas extreme pancreatic tail but most of the body and tail of pancreas do not enhance. No focal or rim enhancing fluid collection identified in the retroperitoneal space. Spleen:  No splenomegaly. No focal mass lesion. Adrenals/Urinary Tract: No adrenal nodule or mass. Limited assessment of the kidney shows no hydronephrosis or gross mass lesion. Stomach/Bowel: Stomach is nondistended. No gastric wall thickening. No evidence of outlet obstruction. Duodenum is normally positioned as is the ligament of Treitz. No small bowel or colonic dilatation within the visualized abdomen. Vascular/Lymphatic: Portal vein and superior mesenteric vein are patent. Splenic vein is patent. Celiac axis and SMA are patent. Small retroperitoneal / peripancreatic lymph nodes noted. Other: Free fluid is noted around the liver and spleen. Mesenteric edema/fluid is evident. Musculoskeletal: No abnormal marrow enhancement within the visualized bony anatomy. IMPRESSION: 1. Markedly motion degraded study. 2. Cholelithiasis with borderline distention of the extrahepatic bile ducts. 3 mm filling defect in the common duct as it enters the head of the pancreas is consistent with with choledocholithiasis. 3. Nonenhancement involving much of the pancreatic body and tail parenchyma, highly  suggestive of pancreatic necrosis. 4. Extensive edema/inflammation in the retroperitoneal space with intraperitoneal fluid and diffuse mesenteric edema in the abdomen. No focal/organized or rim enhancing fluid collection at this time to suggest evolving pseudocyst or abscess. 5. Hepatomegaly. 6. Portal vein is patent. Electronically Signed   By: Kennith Center M.D.   On: 11/04/2017 08:12   Ct Renal Stone Study  Result Date: 11-24-2017 CLINICAL DATA:  Left-sided flank pain and hematuria.  Vomiting. EXAM: CT ABDOMEN AND PELVIS WITHOUT CONTRAST TECHNIQUE: Multidetector CT imaging of the abdomen and pelvis was performed following the standard protocol without oral or intravenous contrast material administration. COMPARISON:  CT abdomen and pelvis April 09, 2008 FINDINGS: Lower chest: There is bibasilar atelectatic change. Hepatobiliary: There is hepatic steatosis. No focal liver lesions are evident on this noncontrast enhanced study. Gallbladder wall is not appreciably thickened. There is no biliary duct dilatation. Pancreas: There is edema throughout the pancreas with soft tissue stranding in the adjacent peripancreatic mesentery throughout the peripancreatic region. Fluid tracks to the right of the pancreatic head and inferior to the uncinate process as well as anterior to the pancreatic head. Fluid tracks from the body and tail the pancreas anteriorly to reach the greater curvature of the stomach in the midportion of the gastric body region. A well-defined pseudocyst type lesion is not seen. There is no pancreatic duct dilatation. Spleen: No splenic lesions are evident. Adrenals/Urinary Tract: Adrenals bilaterally appear normal. Kidneys bilaterally show no evident mass or hydronephrosis. There is a 1 mm calculus in the upper pole left kidney, nonobstructing. There is no evident ureteral calculus on either side. Urinary bladder is midline with wall thickness within normal limits. Stomach/Bowel: There is mild wall  thickening along the greater curvature of the stomach in the midbody region in along the second and third duodenum due to nearby pancreatitis. No other bowel wall thickening is evident. There is no appreciable bowel obstruction. No evident free air or portal venous air. Vascular/Lymphatic: There is no abdominal aortic aneurysm. No vascular lesions are appreciable on this study. No adenopathy is evident in the abdomen or pelvis. Reproductive: Uterus is anteverted. There is an apparent leiomyoma extending from the leftward aspect of the uterus anteriorly measuring 4.3 x 4.0 cm. No extrauterine pelvic mass evident. Other: Appendix appears normal. No abscess  is seen in the abdomen or pelvis. No free pelvic fluid seen. Loculated fluid adjacent to the pancreas is noted. Musculoskeletal: There is degenerative change in the lumbar spine. There are no blastic or lytic bone lesions. There is no intramuscular or abdominal wall lesion evident. IMPRESSION: 1. Acute pancreatitis with pancreatic edema and peripancreatic fluid, most notably anterior to the body and tail of the pancreas. No well-defined pseudocyst or noncystic mass. No pancreatic duct dilatation. 2. Areas of gastritis and duodenitis due to adjacent pancreatitis causing inflammation in these areas. No bowel obstruction. No abscess. Appendix appears normal. 3. 1 mm nonobstructing calculus upper pole left kidney. No hydronephrosis or ureteral calculus on either side. 4.  Hepatic steatosis. 5. 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 Electronically Signed   By: Bretta Bang III M.D.   On: Nov 13, 2017 12:04     LOS: 22 days   Signature  Susa Raring M.D on 11/25/2017 at 12:50 PM  Between 7am to 7pm - Pager - [phone] ( page via [email or web address], text pages only, please mention full 10 digit call back number).  After 7pm go to [email or web address] - password [email or web address]

## 2017-11-25 NOTE — Progress Notes (Signed)
Pharmacy Antibiotic Note  Emma Washington is a 57 y.o. female admitted on Oct 10, 2018 with gallstone pancreatitis with necrosis s/p MRCP on 1/15 .  Pharmacy has been consulted for fluconazole dosing for Oropharangeal candidiasis.  Plan: Fluconazole 200 mg IV x 1 dose then fluconazole 100mg  IV q24  Height: 5\' 10"  (177.8 cm) Weight: (!) 312 lb 2.7 oz (141.6 kg) IBW/kg (Calculated) : 68.5  Temp (24hrs), Avg:98.1 F (36.7 C), Min:97.6 F (36.4 C), Max:98.5 F (36.9 C)  Recent Labs  Lab 11/19/17 0402  11/21/17 0554 11/22/17 0827 11/23/17 0400 11/24/17 0446 11/25/17 0500 11/25/17 0825  WBC 18.7*  --   --   --  15.4* 17.7*  --  22.6*  CREATININE 1.60*   < > 1.82* 1.78* 1.73* 1.63* 1.67*  --    < > = values in this interval not displayed.    Estimated Creatinine Clearance: 58.7 mL/min (A) (by C-G formula based on SCr of 1.67 mg/dL (H)).    Allergies  Allergen Reactions  . Demerol Other (See Comments)    Severe hypotension  . Meperidine     Other reaction(s): Hypotension, Hypotension (ALLERGY/intolerance)  . Other Anaphylaxis    Pineapple  . Pineapple Swelling and Anaphylaxis   Thank you for allowing pharmacy to be a part of this patient's care.  Herby AbrahamMichelle T. Montia Haslip, Pharm.D. [phone] 11/25/2017 5:01 PM

## 2017-11-25 NOTE — Progress Notes (Signed)
     [facility] Progress Note  CC:  Gallstone pancreatitis  Subjective:  Feels about the same.  Extremely tired.  Had another BM today and passing flatus.  Objective:  Vital signs in last 24 hours: Temp:  [97.6 F (36.4 C)-98.5 F (36.9 C)] 98.5 F (36.9 C) (02/05 0800) Pulse Rate:  [91-110] 98 (02/05 0800) Resp:  [22-43] 30 (02/05 0820) BP: (102-144)/(59-92) 129/71 (02/05 0800) SpO2:  [92 %-98 %] 98 % (02/05 0820) Weight:  [312 lb 2.7 oz (141.6 kg)] 312 lb 2.7 oz (141.6 kg) (02/05 0500) Last BM Date: 11/24/17 General:  Alert, Well-developed, in NAD; appears older than stated age Heart:  Regular rate and rhythm; no murmurs Pulm:  Decreased BS with some increased WOB. Abdomen:  Soft, obese, distended.  BS very quiet and sparse.  Non-tender.  Extremities:  Without edema. Neurologic:  Alert and oriented x 4;  grossly normal neurologically. Psych:  Alert and cooperative. Normal mood and affect.  Intake/Output from previous day: 02/04 0701 - 02/05 0700 In: 4298.7 [I.V.:4298.7] Out: 1500 [Urine:1500]  Lab Results: Recent Labs    11/23/17 0400 11/24/17 0446 11/25/17 0825  WBC 15.4* 17.7* 22.6*  HGB 8.0* 8.0* 7.1*  HCT 24.9* 24.4* 26.4*  PLT 398 406* 427*   BMET Recent Labs    11/23/17 0400 11/24/17 0446 11/25/17 0500  NA 139 138 136  K 3.7 3.6 3.5  CL 109 112* 108  CO2 21* 20* 21*  GLUCOSE 162* 175* 160*  BUN 64* 62* 67*  CREATININE 1.73* 1.63* 1.67*  CALCIUM 8.2* 8.1* 8.2*   LFT Recent Labs    11/24/17 0446  PROT 5.8*  ALBUMIN 1.6*  AST 53*  ALT 66*  ALKPHOS 283*  BILITOT 0.3   Assessment / Plan: #1 57 yo female, day # 21 hospital stay, with severe necrotizing gallstone pancreatitis with SIRs, required vent support earlier in admit.  Transferred back to ICU 1/30 with decline in respiratory status secondary to volume overload- improved.  Ongoing abdominal pain and ileus-more comfortable with PCA.  On TPN for nutritional support.  Off  antibiotics, but WBC count trending up to 22.6K today.  No plans for ERCP.  Plan is for follow up CT later this week.  #2 AKI:  Improving.  #3 anemia - secondary to acute illness.  Hgb down to 7.1 grams today.  Monitor and should transfuse for <7 grams. #4 morbid obesity   LOS: 22 days   Tami Blass D. Griselda Bramblett  11/25/2017, 9:14 AM  Pager number [phone]

## 2017-11-26 ENCOUNTER — Inpatient Hospital Stay (HOSPITAL_COMMUNITY): Payer: BLUE CROSS/BLUE SHIELD

## 2017-11-26 DIAGNOSIS — J189 Pneumonia, unspecified organism: Secondary | ICD-10-CM | POA: Clinically undetermined

## 2017-11-26 DIAGNOSIS — J9601 Acute respiratory failure with hypoxia: Secondary | ICD-10-CM | POA: Diagnosis present

## 2017-11-26 DIAGNOSIS — B370 Candidal stomatitis: Secondary | ICD-10-CM

## 2017-11-26 DIAGNOSIS — E8770 Fluid overload, unspecified: Secondary | ICD-10-CM | POA: Diagnosis present

## 2017-11-26 DIAGNOSIS — K219 Gastro-esophageal reflux disease without esophagitis: Secondary | ICD-10-CM

## 2017-11-26 DIAGNOSIS — K8512 Biliary acute pancreatitis with infected necrosis: Secondary | ICD-10-CM

## 2017-11-26 DIAGNOSIS — K8591 Acute pancreatitis with uninfected necrosis, unspecified: Secondary | ICD-10-CM

## 2017-11-26 DIAGNOSIS — G9341 Metabolic encephalopathy: Secondary | ICD-10-CM | POA: Diagnosis not present

## 2017-11-26 DIAGNOSIS — J9602 Acute respiratory failure with hypercapnia: Secondary | ICD-10-CM

## 2017-11-26 LAB — BRAIN NATRIURETIC PEPTIDE: B Natriuretic Peptide: 35.1 pg/mL (ref 0.0–100.0)

## 2017-11-26 LAB — GLUCOSE, CAPILLARY
GLUCOSE-CAPILLARY: 159 mg/dL — AB (ref 65–99)
GLUCOSE-CAPILLARY: 153 mg/dL — AB (ref 65–99)
GLUCOSE-CAPILLARY: 133 mg/dL — AB (ref 65–99)
Glucose-Capillary: 154 mg/dL — ABNORMAL HIGH (ref 65–99)
Glucose-Capillary: 156 mg/dL — ABNORMAL HIGH (ref 65–99)
Glucose-Capillary: 134 mg/dL — ABNORMAL HIGH (ref 65–99)

## 2017-11-26 LAB — BLOOD GAS, ARTERIAL
ACID-BASE DEFICIT: 6.9 mmol/L — AB (ref 0.0–2.0)
Acid-base deficit: 7.7 mmol/L — ABNORMAL HIGH (ref 0.0–2.0)
Acid-base deficit: 6.1 mmol/L — ABNORMAL HIGH (ref 0.0–2.0)
Bicarbonate: 19.4 mmol/L — ABNORMAL LOW (ref 20.0–28.0)
Bicarbonate: 20.4 mmol/L (ref 20.0–28.0)
Bicarbonate: 20.2 mmol/L (ref 20.0–28.0)
Delivery systems: POSITIVE
Delivery systems: POSITIVE
Drawn by: 11249
Drawn by: 331471
Drawn by: 331471
Expiratory PAP: 5
Expiratory PAP: 5
FIO2: 40
FIO2: 40
FIO2: 60
Inspiratory PAP: 16
Inspiratory PAP: 16
Mode: POSITIVE
O2 SAT: 88.6 %
O2 Saturation: 97.4 %
O2 Saturation: 96.7 %
PCO2 ART: 55.3 mmHg — AB (ref 32.0–48.0)
PEEP: 10 cmH2O
PH ART: 7.196 — AB (ref 7.350–7.450)
Patient temperature: 101
Patient temperature: 98.6
Patient temperature: 100.5
RATE: 12 resp/min
RATE: 25 resp/min
VT: 450 mL
pCO2 arterial: 53.1 mmHg — ABNORMAL HIGH (ref 32.0–48.0)
pCO2 arterial: 48.7 mmHg — ABNORMAL HIGH (ref 32.0–48.0)
pH, Arterial: 7.197 — CL (ref 7.350–7.450)
pH, Arterial: 7.246 — ABNORMAL LOW (ref 7.350–7.450)
pO2, Arterial: 122 mmHg — ABNORMAL HIGH (ref 83.0–108.0)
pO2, Arterial: 98.2 mmHg (ref 83.0–108.0)
pO2, Arterial: 68.4 mmHg — ABNORMAL LOW (ref 83.0–108.0)

## 2017-11-26 LAB — CBC
HEMATOCRIT: 24 % — AB (ref 36.0–46.0)
Hemoglobin: 7.7 g/dL — ABNORMAL LOW (ref 12.0–15.0)
MCH: 29.8 pg (ref 26.0–34.0)
MCHC: 32.1 g/dL (ref 30.0–36.0)
MCV: 93 fL (ref 78.0–100.0)
Platelets: 477 10*3/uL — ABNORMAL HIGH (ref 150–400)
RBC: 2.58 MIL/uL — AB (ref 3.87–5.11)
RDW: 16 % — ABNORMAL HIGH (ref 11.5–15.5)
WBC: 25.1 10*3/uL — AB (ref 4.0–10.5)

## 2017-11-26 LAB — COMPREHENSIVE METABOLIC PANEL
ALK PHOS: 273 U/L — AB (ref 38–126)
ALT: 64 U/L — AB (ref 14–54)
AST: 39 U/L (ref 15–41)
Albumin: 1.6 g/dL — ABNORMAL LOW (ref 3.5–5.0)
Anion gap: 6 (ref 5–15)
BILIRUBIN TOTAL: 0.1 mg/dL — AB (ref 0.3–1.2)
BUN: 69 mg/dL — AB (ref 6–20)
CALCIUM: 8.4 mg/dL — AB (ref 8.9–10.3)
CO2: 23 mmol/L (ref 22–32)
CREATININE: 1.69 mg/dL — AB (ref 0.44–1.00)
Chloride: 108 mmol/L (ref 101–111)
GFR, EST AFRICAN AMERICAN: 38 mL/min — AB (ref >60)
GFR, EST NON AFRICAN AMERICAN: 33 mL/min — AB (ref >60)
Glucose, Bld: 164 mg/dL — ABNORMAL HIGH (ref 65–99)
Potassium: 3.7 mmol/L (ref 3.5–5.1)
Sodium: 137 mmol/L (ref 135–145)
Total Protein: 5.8 g/dL — ABNORMAL LOW (ref 6.5–8.1)

## 2017-11-26 LAB — LACTIC ACID, PLASMA: Lactic Acid, Venous: 0.8 mmol/L (ref 0.5–1.9)

## 2017-11-26 MED ORDER — FENTANYL CITRATE (PF) 100 MCG/2ML IJ SOLN
50.0000 ug | Freq: Once | INTRAMUSCULAR | Status: AC
  Administered 2017-11-26: 50 ug via INTRAVENOUS
  Filled 2017-11-26: qty 2

## 2017-11-26 MED ORDER — TRACE MINERALS CR-CU-MN-SE-ZN 10-1000-500-60 MCG/ML IV SOLN
INTRAVENOUS | Status: AC
  Administered 2017-11-26: 18:00:00 via INTRAVENOUS
  Filled 2017-11-26: qty 2000
  Filled 2017-11-26: qty 2640

## 2017-11-26 MED ORDER — FUROSEMIDE 10 MG/ML IJ SOLN
60.0000 mg | Freq: Two times a day (BID) | INTRAMUSCULAR | Status: DC
Start: 2017-11-26 — End: 2017-11-27
  Administered 2017-11-26: 60 mg via INTRAVENOUS
  Filled 2017-11-26 (×2): qty 6

## 2017-11-26 MED ORDER — FUROSEMIDE 10 MG/ML IJ SOLN
80.0000 mg | Freq: Once | INTRAMUSCULAR | Status: AC
  Administered 2017-11-26: 80 mg via INTRAVENOUS
  Filled 2017-11-26: qty 8

## 2017-11-26 MED ORDER — LORAZEPAM 2 MG/ML IJ SOLN
1.0000 mg | Freq: Once | INTRAMUSCULAR | Status: AC
  Administered 2017-11-26: 1 mg via INTRAVENOUS
  Filled 2017-11-26: qty 1

## 2017-11-26 MED ORDER — ACETAMINOPHEN 325 MG RE SUPP
975.0000 mg | RECTAL | Status: DC | PRN
  Administered 2017-11-26: 975 mg via RECTAL
  Filled 2017-11-26: qty 3

## 2017-11-26 MED ORDER — FENTANYL BOLUS VIA INFUSION
50.0000 ug | INTRAVENOUS | Status: DC | PRN
  Filled 2017-11-26: qty 50

## 2017-11-26 MED ORDER — FENTANYL 2500MCG IN NS 250ML (10MCG/ML) PREMIX INFUSION
25.0000 ug/h | INTRAVENOUS | Status: DC
  Administered 2017-11-26: 50 ug/h via INTRAVENOUS
  Administered 2017-11-27: 75 ug/h via INTRAVENOUS
  Administered 2017-11-28: 125 ug/h via INTRAVENOUS
  Administered 2017-11-29: 150 ug/h via INTRAVENOUS
  Filled 2017-11-26 (×5): qty 250

## 2017-11-26 MED ORDER — SODIUM CHLORIDE 0.9 % IV BOLUS (SEPSIS)
500.0000 mL | Freq: Once | INTRAVENOUS | Status: AC
  Administered 2017-11-26: 500 mL via INTRAVENOUS

## 2017-11-26 MED ORDER — MIDAZOLAM HCL 2 MG/2ML IJ SOLN
2.0000 mg | INTRAMUSCULAR | Status: DC | PRN
  Administered 2017-11-27 – 2017-11-28 (×2): 2 mg via INTRAVENOUS

## 2017-11-26 MED ORDER — FAT EMULSION 20 % IV EMUL
240.0000 mL | INTRAVENOUS | Status: AC
  Administered 2017-11-26: 240 mL via INTRAVENOUS
  Filled 2017-11-26: qty 250

## 2017-11-26 MED ORDER — SODIUM CHLORIDE 0.9 % IV SOLN
25.0000 ug/h | INTRAVENOUS | Status: DC
  Filled 2017-11-26: qty 50

## 2017-11-26 MED ORDER — MIDAZOLAM HCL 2 MG/2ML IJ SOLN
INTRAMUSCULAR | Status: AC
  Administered 2017-11-26: 12:00:00
  Filled 2017-11-26: qty 2

## 2017-11-26 MED ORDER — FENTANYL CITRATE (PF) 100 MCG/2ML IJ SOLN
INTRAMUSCULAR | Status: AC
  Administered 2017-11-26: 50 ug
  Filled 2017-11-26: qty 2

## 2017-11-26 MED ORDER — MIDAZOLAM HCL 2 MG/2ML IJ SOLN
2.0000 mg | INTRAMUSCULAR | Status: DC | PRN
  Filled 2017-11-26 (×2): qty 2

## 2017-11-26 MED ORDER — SODIUM CHLORIDE 0.9 % IV SOLN
1.0000 g | Freq: Three times a day (TID) | INTRAVENOUS | Status: DC
  Administered 2017-11-26 – 2017-11-27 (×3): 1 g via INTRAVENOUS
  Filled 2017-11-26 (×4): qty 1

## 2017-11-26 NOTE — Progress Notes (Signed)
[facility] Progress Note   Chief Complaint:    pancreatitis   SUBJECTIVE:    too lethargic to converse. On bipap  ASSESSMENT AND PLAN:   1. 57 yo female with severe gallstone pancreatitis. The thought has been that she passed a gallstone, no ERCP has been done or planned at this point. She has been to ill to undergo cholecystectomy. Prolonged hospital course complicated by respiratory distress  / pulmonary edema requiring intubation, AKI, ileus. Getting TNA. Her WBC is rising again, up to 25K, Temp 100. Repeat CTscan yesterday (non-contrast) reveals interval progression of necrotizing pancreatitis. LUQ pseudocyst with mass effect on posterior stomach. Significant increase in volume of ascites. Multiple pseudocyst suspected.  -I called Surgery regarding CT scans findings. Spoke with P.A. Tresa EndoKelly -IR consultation called - spoke with P.A. Caryn BeeKevin -Restarting Merrem now.  2. Respiratory failure ? HCAP.  Bilateral pleural effusions with overlying airspace consolidation identified left greater than right. Increased from previous exam. ABGs acidotic with pH 7.2. On Bipap. PCCM following -Discussed with Hospitalist Thompson and PCCM, patient may need intubating again. If pulmonary findings on CT scan represent HCAP then Merrem should cover   3. AKI, Cr stable ~ 1.6  4. Anemia related to critical illness. Hgb stable at 7.7    OBJECTIVE:     Vital signs in last 24 hours: Temp:  [98.4 F (36.9 C)-100 F (37.8 C)] 99.2 F (37.3 C) (02/06 0800) Pulse Rate:  [92-104] 104 (02/06 0800) Resp:  [28-43] 36 (02/06 0800) BP: (116-142)/(55-79) 129/55 (02/06 0800) SpO2:  [93 %-99 %] 98 % (02/06 0832) FiO2 (%):  [30 %-40 %] 30 % (02/06 0832) Weight:  [312 lb 9.8 oz (141.8 kg)] 312 lb 9.8 oz (141.8 kg) (02/06 0453) Last BM Date: 11/24/17 General:   Alert, obese white female. On bipap, currently in NAD. Lethargic Heart:  Sinus tachycardia. 1-2+ BLE edema Pulm: Normal respiratory  effort, lungs CTA bilaterally without wheezes or crackles. Abdomen:  Soft, distended,, hypoactive bowel sounds Neurologic:  Alert and  oriented x4;  grossly normal neurologically. Psych:  Pleasant, cooperative.  Normal mood and affect.   Intake/Output from previous day: 02/05 0701 - 02/06 0700 In: 2672 [I.V.:2572; IV Piggyback:100] Out: 2575 [Urine:2575] Intake/Output this shift: No intake/output data recorded.  Lab Results: Recent Labs    11/24/17 0446 11/25/17 0825 11/26/17 0456  WBC 17.7* 22.6* 25.1*  HGB 8.0* 7.1* 7.7*  HCT 24.4* 26.4* 24.0*  PLT 406* 427* 477*   BMET Recent Labs    11/24/17 0446 11/25/17 0500 11/26/17 0456  NA 138 136 137  K 3.6 3.5 3.7  CL 112* 108 108  CO2 20* 21* 23  GLUCOSE 175* 160* 164*  BUN 62* 67* 69*  CREATININE 1.63* 1.67* 1.69*  CALCIUM 8.1* 8.2* 8.4*   LFT Recent Labs    11/26/17 0456  PROT 5.8*  ALBUMIN 1.6*  AST 39  ALT 64*  ALKPHOS 273*  BILITOT 0.1*     Ct Abdomen Pelvis Wo Contrast  Result Date: 11/25/2017 CLINICAL DATA:  Gallstone pancreatitis.  Evaluate for abscess. EXAM: CT ABDOMEN AND PELVIS WITHOUT CONTRAST TECHNIQUE: Multidetector CT imaging of the abdomen and pelvis was performed following the standard protocol without IV contrast. COMPARISON:  11/11/2017 FINDINGS: Lower chest: Bilateral pleural effusions with overlying airspace consolidation identified left greater than right. Increased from previous exam. Hepatobiliary: No focal liver abnormality. The gallbladder is unremarkable. Pancreas: Evaluation of the pancreas is diminished due to lack of IV contrast material. Compared with  the previous exam there appears to have been interval progression of necrotizing pancreatitis. The neck body and tail of pancreas are no longer distinguishable from the surrounding inflammatory changes. Spleen: Spleen negative. Adrenals/Urinary Tract: Normal appearance of the adrenal glands. The kidneys are unremarkable. No mass or  hydronephrosis. Urinary bladder is collapsed around a Foley catheter balloon. Stomach/Bowel: Left upper quadrant pseudocyst has mass effect upon the posterior wall of the stomach. No abnormal dilatation of the gastric lumen. The small bowel loops are nondilated. There is no pathologic dilatation of the colon. Vascular/Lymphatic: Normal appearance of the abdominal aorta. Multiple prominent mesenteric lymph nodes are identified and appear increased from previous exam, likely reactive. No pelvic or inguinal adenopathy. Reproductive: There is a fibroid arising from the left lateral uterine fundus measuring approximately 4.2 cm. Other: There has been significant interval increase in volume of ascites within the abdomen and pelvis. Although limited due to lack of IV and oral contrast material multiple pseudocyst formation within the abdomen is suspected. The largest is in the central mesentery measuring approximately 8.0 by 11.6 cm, image 65 of series 2. Within the lesser sac there is a suspected pseudocyst measuring 11.2 by 7.1 cm. Loculated fluid along the left pericolic gutter fluid collection along the inferior margin of the spleen in the left kidney measures approximately 12.5 by 8.2 cm. Musculoskeletal: No acute or significant osseous findings. IMPRESSION: 1. Progressive changes of suspected necrotizing pancreatitis identified. 2. Increase in volume of ascites within the abdomen and pelvis. 3. Within the limitations of unenhanced technique multiple fluid collections representing pseudo cysts are suspected within the central mesentery, lesser sac of the left upper quadrant and below the spleen. Electronically Signed   By: Signa Kell M.D.   On: 11/25/2017 15:07    Dg Chest Port 1 View  Result Date: 11/26/2017 CLINICAL DATA:  Shortness of Breath EXAM: PORTABLE CHEST 1 VIEW COMPARISON:  November 25, 2017 FINDINGS: Central catheter tip is in the superior vena cava. No pneumothorax. There are pleural effusions  bilaterally with patchy bibasilar atelectasis. There is no consolidation or appreciable edema. Heart is borderline enlarged with pulmonary vascular within normal limits. No adenopathy. No bone lesions. IMPRESSION: Bilateral pleural effusions with bibasilar atelectasis. Stable cardiac prominence. No frank edema or consolidation evident. Central catheter tip in superior vena cava without pneumothorax. Electronically Signed   By: Bretta Bang III M.D.   On: 11/26/2017 08:11   Principal Problem:   Acute severe pancreatitis Active Problems:   Gallstone pancreatitis   Hypocalcemia   AKI (acute kidney injury) (HCC)   Anemia   Morbid obesity with body mass index of 40.0-49.9 (HCC)   Polycystic ovarian syndrome   Steatohepatitis, nonalcoholic   Nephrolithiasis   GERD (gastroesophageal reflux disease)   Cholelithiasis     LOS: 23 days   Willette Cluster ,NP 11/26/2017, 9:23 AM  Pager number [phone]

## 2017-11-26 NOTE — Progress Notes (Signed)
About 6mL out of 25 mL of fentanyl (40 mcg/mL) from PCA pump wasted in sink with Tennis MustElizabeth M, RN as witness.

## 2017-11-26 NOTE — Progress Notes (Signed)
Pt remains on BIPAP due to ABG.

## 2017-11-26 NOTE — Progress Notes (Signed)
Patient intubated. Verbal orders received by MD, and carried out, to administer 50 mcg fentanyl, 2 mg midazolam, 20 mg etomidate, 100 mg rocuronium.

## 2017-11-26 NOTE — Progress Notes (Signed)
OT Cancellation Note  Patient Details Name: Emma Washington MRN: [id number] DOB: 10/20/1961   Cancelled Treatment:    Reason Eval/Treat Not Completed: Medical issues which prohibited therapy  Reason Eval/Treat Not Completed: Medical issues which prohibited therapy,on BiPAP currently, notes from overnight noted. Will check back later    Jackson Parish HospitalREDDING, Dorena BodoLorraine D Lori Biagio Snelson, [place] [phone] 11/26/2017, 9:00 AM

## 2017-11-26 NOTE — Progress Notes (Addendum)
PHARMACY - ADULT TOTAL PARENTERAL NUTRITION CONSULT NOTE + ANTIBIOTIC NOTE  Pharmacy Consult for TPN + Meropenem Indication: severe necrotizing pancreatitis  Patient Measurements: Height: 5' 10" (177.8 cm) Weight: (!) 312 lb 9.8 oz (141.8 kg) IBW/kg (Calculated) : 68.5 TPN AdjBW (KG): 85.3 Body mass index is 44.86 kg/m.  Insulin Requirements:  - 13 units Moderate Novolog SSI / 24hrs - 100 units regular insulin in TPN  Current Nutrition: CL diet, TPN at goal Boost Breeze ordered BID, each supplement provides 250 kcal and 9 grams of protein.  Pt getting doses occasionally. Unable to intake further doses today due to respiratory distress, currently on BiPAP, potential intubation.  IVF: off 2/4 pm  Central access: R IJ HD catheter TPN start date: 1/24  ASSESSMENT                                                                                                          HPI: 57 y.o. Female with gallstone pancreatitis with necrosis s/p MRCP on 1/15.  Repeat LFTs and lipase normalizing.  No plans for surgical intervention at this time; planning for delayed cholecystectomy per CCS.  Tube feeding was started but had to be stopped on 1/22 due to nausea/vomiting.  Pharmacy is consulted to begin TPN. Patient's course has been complicated with respiratory failure and intubation and subsequent extubation on 11/09/2017.  She was also on CRRT from 1/18-1/23.  Significant events:  1/28: CL diet 1/29: FL diet 1/30: did not tolerate FLD, back to CLD, weight incr 305 > 314  2/2 Remains volume overloaded, but respiratory status is improved.  Avoiding lasix prior to contrast CT early next week.  Tolerating volume and OK to continue to advance towards goal rate per MD. 2/6 repeat CT scan yesterday reveals interval progression of necrotizing pancreatitis.  CXR shows bilateral pleural effusions and airspace disease. With WBC increase, resuming antibiotics with Meropenem for IAI and possible HCAP. PCCM  re-consulted for possible intubation.  Today: 11/26/2017  Glucose: No hx DM. CBGs elevated since starting TPN but improved with addition of insulin to TPN (CBG 131-159).  Also, getting Boost Breeze supplements occasionally.  Requiring high amount of insulin in TPN to keep CBGs controlled in setting of pancreatitis.  Electrolytes: Phos, Mag, K, Na, CorrCa have been WNL withOUT electrolytes in TPN.  Phos-Calcium product <55.   Renal: AKI on CRRT 1/18-1/23. SCr remains elevated off CRRT with very slow improvement.  Last lasix given 1/30.  I/O net neutral yesterday with good UOP, overall weight up ~7kg from admission weight of 134 kg.   LFTs: AST/ALT improved, Alk phos elevated, Tbili low  TGs: 213 (1/15), 332 (1/18), 276 (1/28) 224 (2/4)- continues to improve  Prealbumin: 6.7 (1/25), 9.3 (1/28), 7.1 (2/4)  Antimicrobials this admission: 1/14 Zosyn >> 1/22 1/22 Meropenem >> 1/28, 2/6 >> 2/5 Fluconazole (oropharyngeal candidasis) >>  Dose adjustments this admission: 1/18 adjust zosyn to 3.375g q6h for 30 min infusions with start of CVVHDF 1/23 Meropenem 1g q12h > q8h since CRRT stopped   Microbiology results: 1/14 MRSA PCR: negative 1/15   Surgical PCR: neg/neg 1/22 BCx: ng-final 2/5 UCx:    NUTRITIONAL GOALS                                                                                           RD recs 1/31: 2270-2405 kcal, >/= 135 grams protein per day  Clinimix 5/15 at a goal rate of 110ml/hr + 20% fat emulsion at 20ml/hr to provide 132g/day protein, 2354Kcal/day.   PLAN                                                                                                                        Start Meropenem 1g IV q8h. Continue Fluconazole 100 mg IV q24h for oropharyngeal Candidiasis dosing.  At 1800 today:  Continue Clinimix 5/15 (NO electrolytes) at goal rate of 110 ml/hr.  Continue 20% lipid emulsion 20 ml/hr over 12 hours.  TPN to contain standard multivitamins and trace  elements  Continue insulin in TPN, increase from 110 units to regular insulin per 24 hr bag.   Continue SSI q4h MODERATE scale  IVF management per MD (currently off).  TPN lab panels on Mondays & Thursdays.   F/u daily.    , PharmD, BCPS Pager: [phone] 11/26/2017 9:51 AM       

## 2017-11-26 NOTE — Progress Notes (Signed)
Pt's SBP in high 70s, low 80s in arm. Best BP reading on leg SBP in 100s. MD paged and made aware. Orders received and carried out to administer 500 mL NS fluid bolus.   Will continue to monitor.

## 2017-11-26 NOTE — Progress Notes (Signed)
PROGRESS NOTE    Emma Washington  RUE:[id number] DOB: 1961/06/03 DOA: 11/10/2017 PCP: No primary care provider on file.   Brief Narrative:  Patient is a 57 y.o. 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  She briefly required CRRT, subsequently extubated and transferred to try to hospitalist service.  Although improved-hospital course was complicated by respiratory distress secondary to volume overload/pulmonary edema on 1/30 requiring transfer to stepdown unit, worsening abdominal pain requiring initiation of fentanyl PCA on 2/1.  After discussion with general surgery/GI-plans are to proceed with a repeat CT scan of the abdomen sometime next week-hopefully with IV contrast if her renal function allows.    Repeat CT of the abdomen and pelvis was done which showed worsening necrotizing pancreatitis.  Patient overnight became more lethargic and respiratory distress requiring BiPAP and noted to be acidotic on ABG.  Patient has been restarted back on IV Merrem per GI recommendations.  General surgery following.  Reconsult critical care medicine as patient is acute respiratory distress and likely requires intubation.  See below for further details.       Assessment & Plan:   Principal Problem:   Acute severe pancreatitis Active Problems:   Acute respiratory failure with hypoxia and hypercapnia (HCC)   Acute metabolic encephalopathy   Gallstone pancreatitis   Hypocalcemia   AKI (acute kidney injury) (HCC)   Anemia   Morbid obesity with body mass index of 40.0-49.9 (HCC)   Polycystic ovarian syndrome   Steatohepatitis, nonalcoholic   Nephrolithiasis   GERD (gastroesophageal reflux disease)   Cholelithiasis   HCAP (healthcare-associated pneumonia)   Volume overload  Severe gallstone pancreatitis: Had evidence of CBD stone, GI  general surgery on board, no plans for ERCP, CBD stone has likely passed, overall improved initially-however a few days back was unable to tolerate advancement to full liquids.  She remains on clear liquids and on TNA.  She continued to have significant abdominal pain requiring PCA and continues to have significant nonspecific leukocytosis which is worsening.  Dr Thedore Mins discussed the case with general surgeon Dr. Michaell Cowing on 11/25/2017, they will withhold on further imaging, they wanted initially to continue present level of care, hold off antibiotics, will eventually need Cholecystectomy.   Defer further management to GI and general surgery.  If further imaging needed will defer to GI and general surgery.  Patient with worsening progressive changes of suspected necrotizing pancreatitis with increasing volume of ascites within the abdomen and pelvis per repeat CT scan of 11/25/2017.  Patient more lethargic this morning.  Patient acidotic.  Patient on BiPAP.  Patient with a worsening leukocytosis.  Will start patient back up on IV Merrem in discussions with GI.  GI has spoken with interventional radiology to assess worsening pseudocyst.  General surgery following.  Continue supportive care with bowel rest, IV TNA for nutrition, pain control. Dr Thedore Mins discussed with the patient future plan of care in case there is not much significant improvement in the coming future, she will start thinking about other modalities of care including palliative care.  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  Palliative care team here has consulted on the patient on 11/24/2017 upon patient request.  Acute encephalopathy Likely multifactorial secondary to worsening necrotizing pancreatitis as well as acute hypoxic respiratory failure.  Patient has been started empirically on IV Merrem in discussions with GI.  GI has spoken to IR to assess the patient  for further evaluation for pseudocyst.  General surgery following.  Will consult with critical care  due to patient's worsening medical status and worsening respiratory status.   Acute kidney injury versus chronic kidney disease stage III: Previous baseline patient states that she has not been to her doctor's office for blood draw for several years, she could have underlying CKD stage III, creatinine seems to have plateaued around 1.6.  Monitor closely with diuresis.  Acute hypoxic respiratory failure:  Patient was intubated from 1/18-1/20.  She was medically stable on nasal cannula-however on 1/30 she developed worsening hypoxia and respiratory distress thought to be due to volume overload with some components from anxiety and pain.  After diuresis with Lasix and BiPAP shortness of breath has improved. Patient however over the past 24 hours has developed worsening hypoxic respiratory failure currently back on the BiPAP, lethargic, drowsy and agitated and pulling at the BiPAP.  ABG obtained consistent with a respiratory acidosis.  CT abdomen and pelvis done 11/25/2017 with bilateral pleural effusions and concerning for consolidation in the left base.  Chest x-ray done this morning also consistent with volume overload.  Patient also with poor mechanics with distended abdomen also likely contributing to her acute hypoxic respiratory failure in addition to her worsening necrotizing pancreatitis.  Check a urine Legionella antigen.  Check a urine pneumococcus antigen.  Placed empirically on IV Merrem for supposed healthcare associated pneumonia noted on CT abdomen and pelvis.  Patient likely requiring reintubation.  Will consult with critical care for further evaluation and management.  Probable healthcare associated pneumonia Per CT abdomen and pelvis of 11/25/2017.  Chest x-ray this morning noted to have bilateral pleural effusions and consistent with volume overload.  Patient acute hypoxic respiratory distress with worsening leukocytosis.  Urine Legionella antigen.  Check a urine pneumococcus antigen.  MRSA  PCR negative.  Check blood cultures x2.  Place empirically back on IV Merrem.  Supportive care.  Anemia of critical illness: Hemoglobin currently stable-hemoglobin today 7.7.  Fusion threshold hemoglobin less than 7.  Volume overload Noted on chest x-ray this morning 11/26/2017.  Patient also in acute respiratory distress.  Placed on Lasix 60 mg IV every 12 hours and monitor renal function closely.  Oral thrush Continue IV fluconazole.  Morbid obesity  Suspected OHS/OSA: Outpatient sleep study-BiPAP nightly if she tolerats.       DVT prophylaxis: SCDs Code Status: Full Family Communication: No family at bedside. Disposition Plan: Transfer to ICU.   Consultants:   Gastroenterology: Dr. Lavon Paganini 11/04/2017  PCCM: Dr Delton Coombes 11/04/2017  Nephrology Dr. Darrick Penna 11/06/2017  General surgery: Dr. Johna Sheriff 11/10/2017  Palliative CARE: Dr. Phillips Odor 11/24/2017  Procedures:   CT renal stone protocol 11/14/17  CT abdomen and pelvis 11/11/2017, 11/25/2017  Chest x-ray 11/26/2017, 11/25/2017, 11/23/2017, 11/19/2017, 11/11/2017, 11/08/2017, 11/07/2017, 11/06/2017, 11/04/2017.  Abdominal x-rays  MRCP 11/04/2017  Renal ultrasound 11/06/2017  Abdominal ultrasound 11-14-17  Antimicrobials:   IV Merrem 11/12/2017>>>>> 11/17/2017  IV Merrem 11/26/2017  IV fluconazole 11/25/2017   Subjective: Patient minimally responsive.  Patient opens eyes to verbal stimuli and drifts back off to sleep.  Lethargic.  Events overnight noted.  Objective: Vitals:   11/26/17 0600 11/26/17 0726 11/26/17 0800 11/26/17 0832  BP:   (!) 129/55   Pulse:   (!) 104   Resp:  (!) 32 (!) 36   Temp: 100 F (37.8 C)  99.2 F (37.3 C)   TempSrc: Axillary  Axillary   SpO2:  98% 99% 98%  Weight:  Height:        Intake/Output Summary (Last 24 hours) at 11/26/2017 0949 Last data filed at 11/26/2017 0438 Gross per 24 hour  Intake 2452 ml  Output 2575 ml  Net -123 ml   Filed Weights   11/24/17 0500 11/25/17 0500  11/26/17 0453  Weight: (!) 137.2 kg (302 lb 7.5 oz) (!) 141.6 kg (312 lb 2.7 oz) (!) 141.8 kg (312 lb 9.8 oz)    Examination:  General exam: Minimally responsive.  On BiPAP. Respiratory system: Decreased breath sounds in the bases.  Some scattered crackles.  Cardiovascular system: S1 & S2 heard, RRR. No JVD, murmurs, rubs, gallops or clicks. No pedal edema. Gastrointestinal system: Abdomen is mildly distended, soft, nontender to palpation, hypoactive bowel sounds. Central nervous system: Minimally responsive.  Drowsy.  Opens eyes to verbal stimuli and then drifts back asleep.  Moving extremities spontaneously.   Extremities: Symmetric 5 x 5 power. Skin: No rashes, lesions or ulcers Psychiatry: Unable to assess due to current mental status.    Data Reviewed: I have personally reviewed following labs and imaging studies  CBC: Recent Labs  Lab 11/23/17 0400 11/24/17 0446 11/25/17 0825 11/26/17 0456  WBC 15.4* 17.7* 22.6* 25.1*  NEUTROABS  --  15.9*  --   --   HGB 8.0* 8.0* 7.1* 7.7*  HCT 24.9* 24.4* 26.4* 24.0*  MCV 92.6 91.7 109.5* 93.0  PLT 398 406* 427* 477*   Basic Metabolic Panel: Recent Labs  Lab 11/21/17 0554 11/22/17 0827 11/23/17 0400 11/24/17 0446 11/25/17 0500 11/26/17 0456  NA 139 138 139 138 136 137  K 3.7 3.9 3.7 3.6 3.5 3.7  CL 110 108 109 112* 108 108  CO2 21* 21* 21* 20* 21* 23  GLUCOSE 146* 148* 162* 175* 160* 164*  BUN 61* 60* 64* 62* 67* 69*  CREATININE 1.82* 1.78* 1.73* 1.63* 1.67* 1.69*  CALCIUM 8.0* 8.1* 8.2* 8.1* 8.2* 8.4*  MG 1.6* 1.9 1.8 1.6* 2.0  --   PHOS 4.5 4.7* 4.2 3.7 4.1  --    GFR: Estimated Creatinine Clearance: 58.1 mL/min (A) (by C-G formula based on SCr of 1.69 mg/dL (H)). Liver Function Tests: Recent Labs  Lab 11/20/17 0448 11/24/17 0446 11/26/17 0456  AST 38 53* 39  ALT 58* 66* 64*  ALKPHOS 160* 283* 273*  BILITOT 0.4 0.3 0.1*  PROT 5.9* 5.8* 5.8*  ALBUMIN 1.9* 1.6* 1.6*   No results for input(s): LIPASE, AMYLASE  in the last 168 hours. No results for input(s): AMMONIA in the last 168 hours. Coagulation Profile: No results for input(s): INR, PROTIME in the last 168 hours. Cardiac Enzymes: No results for input(s): CKTOTAL, CKMB, CKMBINDEX, TROPONINI in the last 168 hours. BNP (last 3 results) No results for input(s): PROBNP in the last 8760 hours. HbA1C: No results for input(s): HGBA1C in the last 72 hours. CBG: Recent Labs  Lab 11/25/17 1619 11/25/17 1941 11/25/17 2340 11/26/17 0323 11/26/17 0750  GLUCAP 126* 131* 149* 159* 153*   Lipid Profile: Recent Labs    11/24/17 0446  TRIG 224*   Thyroid Function Tests: No results for input(s): TSH, T4TOTAL, FREET4, T3FREE, THYROIDAB in the last 72 hours. Anemia Panel: No results for input(s): VITAMINB12, FOLATE, FERRITIN, TIBC, IRON, RETICCTPCT in the last 72 hours. Sepsis Labs: No results for input(s): PROCALCITON, LATICACIDVEN in the last 168 hours.  No results found for this or any previous visit (from the past 240 hour(s)).       Radiology Studies: Ct Abdomen Pelvis Wo  Contrast  Result Date: 11/25/2017 CLINICAL DATA:  Gallstone pancreatitis.  Evaluate for abscess. EXAM: CT ABDOMEN AND PELVIS WITHOUT CONTRAST TECHNIQUE: Multidetector CT imaging of the abdomen and pelvis was performed following the standard protocol without IV contrast. COMPARISON:  11/11/2017 FINDINGS: Lower chest: Bilateral pleural effusions with overlying airspace consolidation identified left greater than right. Increased from previous exam. Hepatobiliary: No focal liver abnormality. The gallbladder is unremarkable. Pancreas: Evaluation of the pancreas is diminished due to lack of IV contrast material. Compared with the previous exam there appears to have been interval progression of necrotizing pancreatitis. The neck body and tail of pancreas are no longer distinguishable from the surrounding inflammatory changes. Spleen: Spleen negative. Adrenals/Urinary Tract: Normal  appearance of the adrenal glands. The kidneys are unremarkable. No mass or hydronephrosis. Urinary bladder is collapsed around a Foley catheter balloon. Stomach/Bowel: Left upper quadrant pseudocyst has mass effect upon the posterior wall of the stomach. No abnormal dilatation of the gastric lumen. The small bowel loops are nondilated. There is no pathologic dilatation of the colon. Vascular/Lymphatic: Normal appearance of the abdominal aorta. Multiple prominent mesenteric lymph nodes are identified and appear increased from previous exam, likely reactive. No pelvic or inguinal adenopathy. Reproductive: There is a fibroid arising from the left lateral uterine fundus measuring approximately 4.2 cm. Other: There has been significant interval increase in volume of ascites within the abdomen and pelvis. Although limited due to lack of IV and oral contrast material multiple pseudocyst formation within the abdomen is suspected. The largest is in the central mesentery measuring approximately 8.0 by 11.6 cm, image 65 of series 2. Within the lesser sac there is a suspected pseudocyst measuring 11.2 by 7.1 cm. Loculated fluid along the left pericolic gutter fluid collection along the inferior margin of the spleen in the left kidney measures approximately 12.5 by 8.2 cm. Musculoskeletal: No acute or significant osseous findings. IMPRESSION: 1. Progressive changes of suspected necrotizing pancreatitis identified. 2. Increase in volume of ascites within the abdomen and pelvis. 3. Within the limitations of unenhanced technique multiple fluid collections representing pseudo cysts are suspected within the central mesentery, lesser sac of the left upper quadrant and below the spleen. Electronically Signed   By: Signa Kell M.D.   On: 11/25/2017 15:07   Dg Chest 2 View  Result Date: 11/25/2017 CLINICAL DATA:  Patient admitted 11-18-17 with gallstone pancreatitis which involved into necrotizing pancreatitis. Respiratory  distress. EXAM: CHEST  2 VIEW COMPARISON:  Single-view of the chest 11/23/2017 and 11/19/2017. FINDINGS: Right PICC remains in place. Left much greater than right pleural effusions are seen. The patient's left effusion appears increased. Associated basilar atelectasis is noted. There is interstitial pulmonary edema. Heart size is upper normal. IMPRESSION: Left greater than right pleural effusions and airspace disease. Left effusion appears increased since yesterday's exam. Pulmonary edema persists. Electronically Signed   By: Drusilla Kanner M.D.   On: 11/25/2017 15:01   Dg Chest Port 1 View  Result Date: 11/26/2017 CLINICAL DATA:  Shortness of Breath EXAM: PORTABLE CHEST 1 VIEW COMPARISON:  November 25, 2017 FINDINGS: Central catheter tip is in the superior vena cava. No pneumothorax. There are pleural effusions bilaterally with patchy bibasilar atelectasis. There is no consolidation or appreciable edema. Heart is borderline enlarged with pulmonary vascular within normal limits. No adenopathy. No bone lesions. IMPRESSION: Bilateral pleural effusions with bibasilar atelectasis. Stable cardiac prominence. No frank edema or consolidation evident. Central catheter tip in superior vena cava without pneumothorax. Electronically Signed   By: Chrissie Noa  Margarita GrizzleWoodruff III M.D.   On: 11/26/2017 08:11        Scheduled Meds: . chlorhexidine  15 mL Mouth Rinse BID  . Chlorhexidine Gluconate Cloth  6 each Topical Daily  . feeding supplement  1 Container Oral BID BM  . fentaNYL   Intravenous Q4H  . furosemide  60 mg Intravenous Q12H  . heparin injection (subcutaneous)  5,000 Units Subcutaneous Q8H  . insulin aspart  0-15 Units Subcutaneous Q4H  . lidocaine  2 patch Transdermal Q24H  . magic mouthwash  10 mL Oral TID  . mouth rinse  15 mL Mouth Rinse q12n4p  . metoCLOPramide (REGLAN) injection  10 mg Intravenous Q8H  . OLANZapine zydis  5 mg Oral QHS  . pantoprazole (PROTONIX) IV  40 mg Intravenous Q12H  . protein  supplement  8 oz Oral Daily  . sodium chloride flush  10-40 mL Intracatheter Q12H   Continuous Infusions: . fluconazole (DIFLUCAN) IV    . TPN (CLINIMIX) Adult without lytes 110 mL/hr at 11/26/17 0438     LOS: 23 days    Time spent: 45 minutes    Ramiro Harvestaniel Grayson Pfefferle, MD [facility] Pager [phone]  If 7PM-7AM, please contact night-coverage [email or web address] Password [email or web address] 11/26/2017, 9:49 AM

## 2017-11-26 NOTE — Procedures (Signed)
Intubation Procedure Note Emma Washington [id number] 01/24/1961  Procedure: Intubation Indications: Airway protection and maintenance  Procedure Details Consent: Risks of procedure as well as the alternatives and risks of each were explained to the (patient/caregiver).  Consent for procedure obtained. Time Out: Verified patient identification, verified procedure, site/side was marked, verified correct patient position, special equipment/implants available, medications/allergies/relevent history reviewed, required imaging and test results available.  Performed  Maximum sterile technique was used including gloves, gown, hand hygiene and mask.  Miller Blade 4. Grade 2 view 1 attempt. 7.5 ETT 24 at lip  Evaluation Hemodynamic Status: BP stable throughout; O2 sats: stable throughout Patient's Current Condition: stable Complications: No apparent complications Patient did tolerate procedure well. Chest X-ray ordered to verify placement.  CXR: pending.   Emma Washington 11/26/2017

## 2017-11-26 NOTE — Progress Notes (Signed)
PT Cancellation Note  Patient Details Name: Jannet MantisJoyce Kienast MRN: [id number] DOB: Nov 28, 1960   Cancelled Treatment:    Reason Eval/Treat Not Completed: Medical issues which prohibited therapy,on BiPAP currently, notes from overnight noted. Will check back later and mobilize if patient stable. ButtersKaren Halsey Hammen PT [phone]    Rada HayHill, Cadince Hilscher Elizabeth 11/26/2017, 8:11 AM

## 2017-11-26 NOTE — Progress Notes (Signed)
   11/26/17 1618  Clinical Encounter Type  Visited With Patient and family together;Health care provider;Other (Comment) (Friend from Hospice)  Visit Type Follow-up  Spiritual Encounters  Spiritual Needs Emotional;Prayer  Stress Factors  Family Stress Factors Major life changes   Followed up from visit on Monday.  Patient is now on vent and according to her friend from Hospice the physicians are considering surgery.  Patient's brother was present and seemed very upset.  Prayed with the family and Pharmacist, hospitalChaplain Scott.  Will follow as needed. Chaplain Agustin CreeNewton Jesseca Marsch

## 2017-11-26 NOTE — Progress Notes (Signed)
Shift event:  Notified by RN regarding pt being less responsive. Earlier in the evening pt had been quite agitated refusing BiPAP and pulling mask off when RRT attempted to place. Pt was given Ativan 1 mg IV and did calm and submit to BiPAP. Now RN reports pt will open eyes to stimulation but remains quite lethargic. Orders placed for ABG which revealed 7.19/53.1/122/19.4 which is c/w previous ABG's during this hospitalization. At bedside pt noted still on BiPAP and resting quietly. She will open eyes to voice but falls back to sleep. BBS difficult to assess d/t body habitus but no wheezes noted at this time. RR in the 30's. Stat PCXR pending.  Assessment/Plan: 1.Lethargy: Suspect pt's lethargy result of IV Ativan. ABG not significantly changed from previous. Discussed pt w/ Dr Arsenio LoaderSommer w/ [facility] who recommended Lasix 80 mg IV and a repeat ABG in a couple of hours. Follow up stat PCXR. Given pt wants to remain a full code he suggest re-consulting PCCM this am if pt does not improve. Will continue to monitor closley in SDU.   Leanne ChangKatherine P. Montgomery Favor, NP-C [facility] Pager [phone]  CRITICAL CARE Performed by: Leanne ChangSCHORR, Edwyn Inclan P   Total critical care time: 30 minutes  Critical care time was exclusive of separately billable procedures and treating other patients.  Critical care was necessary to treat or prevent imminent or life-threatening deterioration.  Critical care was time spent personally by me on the following activities: development of treatment plan with patient and/or surrogate as well as nursing, discussions with consultants, evaluation of patient's response to treatment, examination of patient, obtaining history from patient or surrogate, ordering and performing treatments and interventions, ordering and review of laboratory studies, ordering and review of radiographic studies, pulse oximetry and re-evaluation of patient's condition.

## 2017-11-26 NOTE — Progress Notes (Signed)
Reviewed CT scan with MD. 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 Leukocytosis could be elevated due to increasing inflammation and no gas or sign of infection on CT. If concern for infection increasing, could consult IR to drain pseudocyst and confirm. No acute surgical intervention warranted for necrotizing pancreatitis.   Wells GuilesKelly Rayburn , [facility] 11/26/2017, 2:37 PM Pager: [phone] Consults: [phone] Mon-Fri 7:00 am-4:30 pm Sat-Sun 7:00 am-11:30 am

## 2017-11-26 NOTE — Consult Note (Signed)
PULMONARY / CRITICAL CARE MEDICINE   Name: Emma MantisJoyce Schwinn MRN: [id number] DOB: [birth year]    ADMISSION DATE:  11/01/2017 CONSULTATION DATE:  2/6 REFERRING MD:  Janee Mornhompson  CHIEF COMPLAINT:  Progressive respiratory failure   HISTORY OF PRESENT ILLNESS:   57 year old female who was initially admitted on 1/14 with gallstone pancreatitis and resultant necrotizing pancreatitis.  She has had a prolonged and complicated hospital course: This included progressive respiratory failure in the setting of what was felt to be acute lung injury, requiring intubation on 1/19, acute renal failure in the setting of organ hypoperfusion, and nonsteroidals requiring CRRT, she was extubated on 1/20 and at that time critical care had signed off.  Subsequently developed worsening respiratory dysfunction felt to be secondary to volume overload and pulmonary edema requiring transfer to the stepdown unit on 1/30.  She was also having worsening abdominal pain.  Because of this a repeat CT scan was obtained showing worsening necrotizing pancreatitis.  During the evening hours of 2/5 patient became progressively short of breath, had notable increasing audible wheezes and respiratory rate in the 35-45 range.  Initial arterial blood gases obtained showed pH of 7.19, PCO2 of 53, and oxygen saturations of 122 with bicarbonate of 19.4.  She was placed on noninvasive positive pressure ventilation which did improve her gas exchange.  Chest x-ray showed bibasilar atelectasis/effusions.  Critical care has been asked to see the a.m. of 2/6 due to progressive weakness, BiPAP dependence, and concern about clinical decompensation  PAST MEDICAL HISTORY :  She  has a past medical history of Anemia, GERD (gastroesophageal reflux disease), Hypertension, Nephrolithiasis, and Pyelonephritis (08/03/2014).  PAST SURGICAL HISTORY: She  has a past surgical history that includes Hand surgery and Hernia repair.  Allergies  Allergen Reactions  . Demerol  Other (See Comments)    Severe hypotension  . Meperidine     Other reaction(s): Hypotension, Hypotension (ALLERGY/intolerance)  . Other Anaphylaxis    Pineapple  . Pineapple Swelling and Anaphylaxis    No current facility-administered medications on file prior to encounter.    Current Outpatient Medications on File Prior to Encounter  Medication Sig  . ibuprofen (ADVIL,MOTRIN) 200 MG tablet Take 1,000 mg by mouth once as needed. For pain  . lisinopril-hydrochlorothiazide (PRINZIDE,ZESTORETIC) 20-25 MG tablet Take 1 tablet by mouth daily as needed (hypertension).   . promethazine (PHENERGAN) 25 MG tablet Take 25 mg by mouth every 6 (six) hours as needed for nausea or vomiting.    FAMILY HISTORY:  Her indicated that her brother is alive.   SOCIAL HISTORY: She  reports that  has never smoked. she has never used smokeless tobacco. She reports that she does not drink alcohol or use drugs.  REVIEW OF SYSTEMS:   Unable to obtain d/t work of breathing   SUBJECTIVE:  Weak and intermittently confused  VITAL SIGNS: BP (!) 129/55   Pulse (!) 104   Temp 99.2 F (37.3 C) (Axillary)   Resp (!) 36   Ht 5\' 10"  (1.778 m)   Wt (!) 312 lb 9.8 oz (141.8 kg)   LMP 11/07/2011   SpO2 98%   BMI 44.86 kg/m   HEMODYNAMICS:    VENTILATOR SETTINGS: FiO2 (%):  [30 %-40 %] 30 %  INTAKE / OUTPUT:  Intake/Output Summary (Last 24 hours) at 11/26/2017 1044 Last data filed at 11/26/2017 0438 Gross per 24 hour  Intake 2342 ml  Output 2575 ml  Net -233 ml     PHYSICAL EXAMINATION: General: Morbidly obese 57 year old female  intermittently oriented currently on BiPAP Neuro: Arousable to voice, moves all extremities, oriented x3, however at times confused HEENT: Neck is large, mucous membranes are moist Cardiovascular: Regular rate and rhythm Lungs: Diminished throughout Abdomen: Large, distended, hypoactive, painful to palpation particularly right upper quadrant Musculoskeletal: Equal strength  and bulk Skin: Warm and dry with brisk cap refill  LABS:  BMET Recent Labs  Lab 11/24/17 0446 11/25/17 0500 11/26/17 0456  NA 138 136 137  K 3.6 3.5 3.7  CL 112* 108 108  CO2 20* 21* 23  BUN 62* 67* 69*  CREATININE 1.63* 1.67* 1.69*  GLUCOSE 175* 160* 164*    Electrolytes Recent Labs  Lab 11/23/17 0400 11/24/17 0446 11/25/17 0500 11/26/17 0456  CALCIUM 8.2* 8.1* 8.2* 8.4*  MG 1.8 1.6* 2.0  --   PHOS 4.2 3.7 4.1  --     CBC Recent Labs  Lab 11/24/17 0446 11/25/17 0825 11/26/17 0456  WBC 17.7* 22.6* 25.1*  HGB 8.0* 7.1* 7.7*  HCT 24.4* 26.4* 24.0*  PLT 406* 427* 477*    Coag's No results for input(s): APTT, INR in the last 168 hours.  Sepsis Markers No results for input(s): LATICACIDVEN, PROCALCITON, O2SATVEN in the last 168 hours.  ABG Recent Labs  Lab 11/26/17 0554 11/26/17 0800  PHART 7.197* 7.246*  PCO2ART 53.1* 48.7*  PO2ART 122* 98.2    Liver Enzymes Recent Labs  Lab 11/20/17 0448 11/24/17 0446 11/26/17 0456  AST 38 53* 39  ALT 58* 66* 64*  ALKPHOS 160* 283* 273*  BILITOT 0.4 0.3 0.1*  ALBUMIN 1.9* 1.6* 1.6*    Cardiac Enzymes No results for input(s): TROPONINI, PROBNP in the last 168 hours.  Glucose Recent Labs  Lab 11/25/17 1147 11/25/17 1619 11/25/17 1941 11/25/17 2340 11/26/17 0323 11/26/17 0750  GLUCAP 145* 126* 131* 149* 159* 153*    Imaging Ct Abdomen Pelvis Wo Contrast  Result Date: 11/25/2017 CLINICAL DATA:  Gallstone pancreatitis.  Evaluate for abscess. EXAM: CT ABDOMEN AND PELVIS WITHOUT CONTRAST TECHNIQUE: Multidetector CT imaging of the abdomen and pelvis was performed following the standard protocol without IV contrast. COMPARISON:  11/11/2017 FINDINGS: Lower chest: Bilateral pleural effusions with overlying airspace consolidation identified left greater than right. Increased from previous exam. Hepatobiliary: No focal liver abnormality. The gallbladder is unremarkable. Pancreas: Evaluation of the pancreas is  diminished due to lack of IV contrast material. Compared with the previous exam there appears to have been interval progression of necrotizing pancreatitis. The neck body and tail of pancreas are no longer distinguishable from the surrounding inflammatory changes. Spleen: Spleen negative. Adrenals/Urinary Tract: Normal appearance of the adrenal glands. The kidneys are unremarkable. No mass or hydronephrosis. Urinary bladder is collapsed around a Foley catheter balloon. Stomach/Bowel: Left upper quadrant pseudocyst has mass effect upon the posterior wall of the stomach. No abnormal dilatation of the gastric lumen. The small bowel loops are nondilated. There is no pathologic dilatation of the colon. Vascular/Lymphatic: Normal appearance of the abdominal aorta. Multiple prominent mesenteric lymph nodes are identified and appear increased from previous exam, likely reactive. No pelvic or inguinal adenopathy. Reproductive: There is a fibroid arising from the left lateral uterine fundus measuring approximately 4.2 cm. Other: There has been significant interval increase in volume of ascites within the abdomen and pelvis. Although limited due to lack of IV and oral contrast material multiple pseudocyst formation within the abdomen is suspected. The largest is in the central mesentery measuring approximately 8.0 by 11.6 cm, image 65 of series 2. Within the  lesser sac there is a suspected pseudocyst measuring 11.2 by 7.1 cm. Loculated fluid along the left pericolic gutter fluid collection along the inferior margin of the spleen in the left kidney measures approximately 12.5 by 8.2 cm. Musculoskeletal: No acute or significant osseous findings. IMPRESSION: 1. Progressive changes of suspected necrotizing pancreatitis identified. 2. Increase in volume of ascites within the abdomen and pelvis. 3. Within the limitations of unenhanced technique multiple fluid collections representing pseudo cysts are suspected within the central  mesentery, lesser sac of the left upper quadrant and below the spleen. Electronically Signed   By: Signa Kell M.D.   On: 11/25/2017 15:07   Dg Chest 2 View  Result Date: 11/25/2017 CLINICAL DATA:  Patient admitted 11/18/2017 with gallstone pancreatitis which involved into necrotizing pancreatitis. Respiratory distress. EXAM: CHEST  2 VIEW COMPARISON:  Single-view of the chest 11/23/2017 and 11/19/2017. FINDINGS: Right PICC remains in place. Left much greater than right pleural effusions are seen. The patient's left effusion appears increased. Associated basilar atelectasis is noted. There is interstitial pulmonary edema. Heart size is upper normal. IMPRESSION: Left greater than right pleural effusions and airspace disease. Left effusion appears increased since yesterday's exam. Pulmonary edema persists. Electronically Signed   By: Drusilla Kanner M.D.   On: 11/25/2017 15:01   Dg Chest Port 1 View  Result Date: 11/26/2017 CLINICAL DATA:  Shortness of Breath EXAM: PORTABLE CHEST 1 VIEW COMPARISON:  November 25, 2017 FINDINGS: Central catheter tip is in the superior vena cava. No pneumothorax. There are pleural effusions bilaterally with patchy bibasilar atelectasis. There is no consolidation or appreciable edema. Heart is borderline enlarged with pulmonary vascular within normal limits. No adenopathy. No bone lesions. IMPRESSION: Bilateral pleural effusions with bibasilar atelectasis. Stable cardiac prominence. No frank edema or consolidation evident. Central catheter tip in superior vena cava without pneumothorax. Electronically Signed   By: Bretta Bang III M.D.   On: 11/26/2017 08:11     STUDIES:  CT abd/pelvis 2/5: 1. Progressive changes of suspected necrotizing pancreatitis Identified. 2. Increase in volume of ascites within the abdomen and pelvis. 3. Within the limitations of unenhanced technique multiple fluid collections representing pseudo cysts are suspected within the central  mesentery, lesser sac of the left upper quadrant and below the spleen.  CULTURES:  UC 2/5>>> BCX2 2/6>>>  ANTIBIOTICS: Meropenem 2/6>>> Fluconazole 2/5>>>  SIGNIFICANT EVENTS:   LINES/TUBES:  RUE picc 1/26>>>  DISCUSSION: 57 year old female patient with prolonged hospital course after being admitted with gallstone pancreatitis and now progressive necrotizing pancreatitis with concern for multiple pseudocysts.  Worsening Sirs physiology, cannot exclude systemic infection.  Now further complicated by hypercarbic respiratory failure which is multifactorial in etiology: Obesity, decreased abdominal compliance, atelectasis, and pain.  I do not think she has pneumonia.  Either way she has had a prolonged hospital course, to continue current level of care she will need intubation however long-term outcome looks bleak.  Suspect best case scenario would be prolonged critical illness, trach, LTAC, with no certainty that she will return to prior level of health.  We are waiting further discussion with palliative care given what appears to be a very poor prognosis long-term ASSESSMENT / PLAN:  New onset SIRS, +/- sepsis in setting of progressive Necrotizing Gallstone pancreatitis;  now what is likely multiple pseudocysts  -WBC count rising and spike in fever curve  -felt to have passed gallstones at this point  -GI following.  -surgical and IR services consulted.  Plan Cont NPO IR consult ? Drainage and  culture of pseudocyst  Repeat blood cultures Day # 1 meropenem  Consider diagnostic  Ck lactic acid Transduce PICC; CVP would be helpful Consider diagnostic paracentesis r/o SBP   Acute hypercarbic respiratory failure.  Multifactorial: hypoventilation d/t decreased cAbd from pain, ascites & ileus, worsening atelectasis, small effusions and possible HCAP;  -suspect narcotics also contributing  Plan Cont NIPPV for now  If pt desires full code her current MS and degree of deconditioning  would necessitate intubation and ventilation; however this will likely necessitate trach Lasix ordered. Will see f/u CVP and decide on further dosing.   Acute encephalopathy -presume primarily d/t hypercarbia but also worried about evolving sepsis Plan Supportive care Careful w/ narcotics  AKI. Had required CRRT back towards the end of January.  -Making urine -cr has been holding ~1.6 Plan Trial lasix Avoid hypotension Renal dose meds F/u chem in am  Strict I&O  Intermittent fluid and electrolyte imbalance Plan Trend chemistry Replace as needed  Protein calorie malnutrition  Plan Cont TPN  Anemia of critical illness Plan Trend cbc Transfuse per protocol   Oral thrush Plan Fluconazole   Hyperglycemia Plan ssi  DVT prophylaxis: SCD SUP: PPI  Diet: npo, tpn Activity: BR Disposition : ICU  FAMILY  - Updates:   - Inter-disciplinary family meet or Palliative Care meeting due by:  2/15    Simonne Martinet ACNP-BC [facility] Pulmonary/Critical Care Pager # [phone] OR # [phone] if no answer  11/26/2017, 10:35 AM

## 2017-11-27 DIAGNOSIS — K8502 Idiopathic acute pancreatitis with infected necrosis: Secondary | ICD-10-CM

## 2017-11-27 LAB — URINE CULTURE: Culture: >=100000 — AB

## 2017-11-27 LAB — PROTIME-INR
INR: 1.24
Prothrombin Time: 15.5 seconds — ABNORMAL HIGH (ref 11.4–15.2)

## 2017-11-27 LAB — BLOOD GAS, ARTERIAL
ACID-BASE DEFICIT: 9 mmol/L — AB (ref 0.0–2.0)
BICARBONATE: 17.8 mmol/L — AB (ref 20.0–28.0)
Drawn by: 331471
FIO2: 50
LHR: 34 {breaths}/min
O2 Saturation: 95.9 %
PEEP/CPAP: 12 cmH2O
Patient temperature: 37
VT: 450 mL
pCO2 arterial: 44.7 mmHg (ref 32.0–48.0)
pH, Arterial: 7.223 — ABNORMAL LOW (ref 7.350–7.450)
pO2, Arterial: 91.7 mmHg (ref 83.0–108.0)

## 2017-11-27 LAB — CBC WITH DIFFERENTIAL/PLATELET
BASOS PCT: 1 %
Basophils Absolute: 0.3 10*3/uL — ABNORMAL HIGH (ref 0.0–0.1)
Eosinophils Absolute: 1.2 10*3/uL — ABNORMAL HIGH (ref 0.0–0.7)
Eosinophils Relative: 4 %
HCT: 23.2 % — ABNORMAL LOW (ref 36.0–46.0)
HEMOGLOBIN: 7.3 g/dL — AB (ref 12.0–15.0)
LYMPHS PCT: 6 %
Lymphs Abs: 1.8 10*3/uL (ref 0.7–4.0)
MCH: 29.3 pg (ref 26.0–34.0)
MCHC: 31.5 g/dL (ref 30.0–36.0)
MCV: 93.2 fL (ref 78.0–100.0)
MONOS PCT: 6 %
Monocytes Absolute: 1.8 10*3/uL — ABNORMAL HIGH (ref 0.1–1.0)
NEUTROS ABS: 24.4 10*3/uL — AB (ref 1.7–7.7)
Neutrophils Relative %: 83 %
Platelets: 485 10*3/uL — ABNORMAL HIGH (ref 150–400)
RBC: 2.49 MIL/uL — ABNORMAL LOW (ref 3.87–5.11)
RDW: 16.3 % — ABNORMAL HIGH (ref 11.5–15.5)
WBC: 29.5 10*3/uL — ABNORMAL HIGH (ref 4.0–10.5)

## 2017-11-27 LAB — COMPREHENSIVE METABOLIC PANEL
ALK PHOS: 249 U/L — AB (ref 38–126)
ALT: 50 U/L (ref 14–54)
AST: 32 U/L (ref 15–41)
Albumin: 1.6 g/dL — ABNORMAL LOW (ref 3.5–5.0)
Anion gap: 8 (ref 5–15)
BUN: 87 mg/dL — AB (ref 6–20)
CHLORIDE: 108 mmol/L (ref 101–111)
CO2: 20 mmol/L — AB (ref 22–32)
CREATININE: 2.53 mg/dL — AB (ref 0.44–1.00)
Calcium: 8.5 mg/dL — ABNORMAL LOW (ref 8.9–10.3)
GFR calc Af Amer: 23 mL/min — ABNORMAL LOW (ref >60)
GFR, EST NON AFRICAN AMERICAN: 20 mL/min — AB (ref >60)
Glucose, Bld: 135 mg/dL — ABNORMAL HIGH (ref 65–99)
Potassium: 3.5 mmol/L (ref 3.5–5.1)
Sodium: 136 mmol/L (ref 135–145)
Total Bilirubin: 0.4 mg/dL (ref 0.3–1.2)
Total Protein: 5.5 g/dL — ABNORMAL LOW (ref 6.5–8.1)

## 2017-11-27 LAB — LEGIONELLA PNEUMOPHILA SEROGP 1 UR AG: L. PNEUMOPHILA SEROGP 1 UR AG: NEGATIVE

## 2017-11-27 LAB — GLUCOSE, CAPILLARY
GLUCOSE-CAPILLARY: 146 mg/dL — AB (ref 65–99)
GLUCOSE-CAPILLARY: 127 mg/dL — AB (ref 65–99)
GLUCOSE-CAPILLARY: 136 mg/dL — AB (ref 65–99)
Glucose-Capillary: 136 mg/dL — ABNORMAL HIGH (ref 65–99)
Glucose-Capillary: 150 mg/dL — ABNORMAL HIGH (ref 65–99)
Glucose-Capillary: 139 mg/dL — ABNORMAL HIGH (ref 65–99)

## 2017-11-27 LAB — MAGNESIUM: MAGNESIUM: 1.7 mg/dL (ref 1.7–2.4)

## 2017-11-27 LAB — PHOSPHORUS: Phosphorus: 5 mg/dL — ABNORMAL HIGH (ref 2.5–4.6)

## 2017-11-27 LAB — STREP PNEUMONIAE URINARY ANTIGEN: STREP PNEUMO URINARY ANTIGEN: NEGATIVE

## 2017-11-27 LAB — HIV ANTIBODY (ROUTINE TESTING W REFLEX): HIV Screen 4th Generation wRfx: NONREACTIVE

## 2017-11-27 MED ORDER — POTASSIUM CHLORIDE 10 MEQ/100ML IV SOLN
10.0000 meq | INTRAVENOUS | Status: AC
Start: 2017-11-27 — End: 2017-11-27
  Administered 2017-11-27 (×2): 10 meq via INTRAVENOUS
  Filled 2017-11-27 (×2): qty 100

## 2017-11-27 MED ORDER — FENTANYL BOLUS VIA INFUSION
100.0000 ug | INTRAVENOUS | Status: DC | PRN
  Administered 2017-11-29 – 2017-12-05 (×13): 100 ug via INTRAVENOUS
  Filled 2017-11-27: qty 100

## 2017-11-27 MED ORDER — DIPHENHYDRAMINE HCL 50 MG/ML IJ SOLN
25.0000 mg | Freq: Once | INTRAMUSCULAR | Status: AC
  Administered 2017-11-27: 25 mg via INTRAVENOUS
  Filled 2017-11-27: qty 1

## 2017-11-27 MED ORDER — TRACE MINERALS CR-CU-MN-SE-ZN 10-1000-500-60 MCG/ML IV SOLN
INTRAVENOUS | Status: AC
  Administered 2017-11-27: 18:00:00 via INTRAVENOUS
  Filled 2017-11-27: qty 1000

## 2017-11-27 MED ORDER — VANCOMYCIN HCL 10 G IV SOLR
2500.0000 mg | Freq: Once | INTRAVENOUS | Status: AC
  Administered 2017-11-27: 2500 mg via INTRAVENOUS
  Filled 2017-11-27: qty 2000

## 2017-11-27 MED ORDER — MAGNESIUM SULFATE 4 GM/100ML IV SOLN
4.0000 g | Freq: Once | INTRAVENOUS | Status: AC
  Administered 2017-11-27: 4 g via INTRAVENOUS
  Filled 2017-11-27: qty 100

## 2017-11-27 MED ORDER — SODIUM CHLORIDE 0.9 % IV SOLN
2.0000 g | Freq: Two times a day (BID) | INTRAVENOUS | Status: DC
  Administered 2017-11-27 – 2017-12-01 (×10): 2 g via INTRAVENOUS
  Filled 2017-11-27 (×13): qty 2

## 2017-11-27 NOTE — Progress Notes (Signed)
RN was called into room by family. Family verbally aggressive with RN; They expressed frustration related to the fact that the patient was awake. Ordered RASS goal is 0. Pt had her eyes open and was resting comfortably in the bed. She denied pain when asked about discomfort. Explained to family that it was okay for patient to be awake as long as she was not agitated. Will continue to monitor patient closely and titrate sedation as appropriate.

## 2017-11-27 NOTE — Progress Notes (Signed)
PULMONARY / CRITICAL CARE MEDICINE   Name: Emma Washington MRN: [id number] DOB: 23-Jun-1961    ADMISSION DATE:  2017-11-27 CONSULTATION DATE:  2/6 REFERRING MD:  Janee Morn  CHIEF COMPLAINT:  Progressive respiratory failure   HISTORY OF PRESENT ILLNESS:   57 year old female who was initially admitted on 1/14 with gallstone pancreatitis and resultant necrotizing pancreatitis.  She has had a prolonged and complicated hospital course: This included progressive respiratory failure in the setting of what was felt to be acute lung injury, requiring intubation on 1/19, acute renal failure in the setting of organ hypoperfusion, and nonsteroidals requiring CRRT, she was extubated on 1/20 and at that time critical care had signed off.  Subsequently developed worsening respiratory dysfunction felt to be secondary to volume overload and pulmonary edema requiring transfer to the stepdown unit on 1/30.  She was also having worsening abdominal pain.  Because of this a repeat CT scan was obtained showing worsening necrotizing pancreatitis.  During the evening hours of 2/5 patient became progressively short of breath, had notable increasing audible wheezes and respiratory rate in the 35-45 range.  Initial arterial blood gases obtained showed pH of 7.19, PCO2 of 53, and oxygen saturations of 122 with bicarbonate of 19.4.  She was placed on noninvasive positive pressure ventilation which did improve her gas exchange.  Chest x-ray showed bibasilar atelectasis/effusions.  Critical care has been asked to see the a.m. of 2/6 due to progressive weakness, BiPAP dependence, and concern about clinical decompensation   SUBJECTIVE:  Sedated on vent   VITAL SIGNS: BP (!) 93/52   Pulse 100   Temp 98.8 F (37.1 C) (Oral)   Resp (!) 34   Ht 5\' 10"  (1.778 m)   Wt (!) 305 lb 5.4 oz (138.5 kg)   LMP 11/07/2011   SpO2 95%   BMI 43.81 kg/m   HEMODYNAMICS: CVP:  [8 mmHg-15 mmHg] 15 mmHg  VENTILATOR SETTINGS: Vent Mode:  PRVC FiO2 (%):  [30 %-60 %] 50 % Set Rate:  [25 bmp-34 bmp] 34 bmp Vt Set:  [450 mL] 450 mL PEEP:  [10 cmH20-12 cmH20] 12 cmH20 Plateau Pressure:  [13 cmH20-30 cmH20] 13 cmH20  INTAKE / OUTPUT:  Intake/Output Summary (Last 24 hours) at 11/27/2017 0910 Last data filed at 11/27/2017 0700 Gross per 24 hour  Intake 2236.55 ml  Output 2310 ml  Net -73.45 ml     PHYSICAL EXAMINATION: General: This is a 57 year old chronically ill appearing obese female.  She is sedated on the ventilator HEENT: Normocephalic atraumatic orally intubated Pulmonary: Diffuse rhonchi diminished bases Abdomen: Distended, firm to palpation, hypoactive, shifting dullness to percussion Cardiac: Regular rate and rhythm Extremities/muscular skeletal but equal strength and bulk no significant edema Neuro: Sedated  LABS:  BMET Recent Labs  Lab 11/25/17 0500 11/26/17 0456 11/27/17 0310  NA 136 137 136  K 3.5 3.7 3.5  CL 108 108 108  CO2 21* 23 20*  BUN 67* 69* 87*  CREATININE 1.67* 1.69* 2.53*  GLUCOSE 160* 164* 135*    Electrolytes Recent Labs  Lab 11/24/17 0446 11/25/17 0500 11/26/17 0456 11/27/17 0310  CALCIUM 8.1* 8.2* 8.4* 8.5*  MG 1.6* 2.0  --  1.7  PHOS 3.7 4.1  --  5.0*    CBC Recent Labs  Lab 11/25/17 0825 11/26/17 0456 11/27/17 0310  WBC 22.6* 25.1* 29.5*  HGB 7.1* 7.7* 7.3*  HCT 26.4* 24.0* 23.2*  PLT 427* 477* 485*    Coag's Recent Labs  Lab 11/27/17 0310  INR 1.24  Sepsis Markers Recent Labs  Lab 11/26/17 1332  LATICACIDVEN 0.8    ABG Recent Labs  Lab 11/26/17 0554 11/26/17 0800 11/26/17 1301  PHART 7.197* 7.246* 7.196*  PCO2ART 53.1* 48.7* 55.3*  PO2ART 122* 98.2 68.4*    Liver Enzymes Recent Labs  Lab 11/24/17 0446 11/26/17 0456 11/27/17 0310  AST 53* 39 32  ALT 66* 64* 50  ALKPHOS 283* 273* 249*  BILITOT 0.3 0.1* 0.4  ALBUMIN 1.6* 1.6* 1.6*    Cardiac Enzymes No results for input(s): TROPONINI, PROBNP in the last 168  hours.  Glucose Recent Labs  Lab 11/26/17 1249 11/26/17 1541 11/26/17 1944 11/26/17 2323 11/27/17 0335 11/27/17 0804  GLUCAP 154* 156* 134* 133* 136* 146*    Imaging Dg Abd 1 View  Result Date: 11/26/2017 CLINICAL DATA:  Orogastric tube placement EXAM: ABDOMEN - 1 VIEW COMPARISON:  None. FINDINGS: Orogastric tube with the tip projecting over the antrum of the stomach. There is no bowel dilatation to suggest obstruction. There is no evidence of pneumoperitoneum, portal venous gas or pneumatosis. There are no pathologic calcifications along the expected course of the ureters. The osseous structures are unremarkable. IMPRESSION: Orogastric tube with the tip projecting over the antrum of the stomach. Electronically Signed   By: Elige KoHetal  Patel   On: 11/26/2017 13:39   Dg Chest Port 1 View  Result Date: 11/26/2017 CLINICAL DATA:  Status post intubation. EXAM: PORTABLE CHEST 1 VIEW COMPARISON:  Radiograph of same day. FINDINGS: Stable cardiomegaly and central pulmonary vascular congestion. Endotracheal tube is seen projected over tracheal air shadow with distal tip 5 cm above the carina. Nasogastric tube is seen entering the stomach. Bilateral basilar opacities are noted concerning for atelectasis with associated pleural effusions. No pneumothorax is noted. Bony thorax is unremarkable. IMPRESSION: Endotracheal and nasogastric tubes are in grossly good position. Bibasilar opacities are noted concerning for subsegmental atelectasis with associated pleural effusions. Electronically Signed   By: Lupita RaiderJames  Green Jr, M.D.   On: 11/26/2017 13:46     STUDIES:  CT abd/pelvis 2/5: 1. Progressive changes of suspected necrotizing pancreatitis Identified. 2. Increase in volume of ascites within the abdomen and pelvis. 3. Within the limitations of unenhanced technique multiple fluid collections representing pseudo cysts are suspected within the central mesentery, lesser sac of the left upper quadrant and  below the spleen.  CULTURES:  UC 2/5>>> enterococcus BCX2 2/6>>>  ANTIBIOTICS: Meropenem 2/6>>> Fluconazole 2/5>>> Vancomycin 2/6>>>  SIGNIFICANT EVENTS:   LINES/TUBES:  RUE picc 1/26>>>  O ETT 2/6>>>  DISCUSSION: 57 year old female patient with prolonged hospital course after being admitted with gallstone pancreatitis and now progressive necrotizing pancreatitis with concern for multiple pseudocysts.  Worsening Sirs physiology, cannot exclude systemic infection.  Now further complicated by hypercarbic respiratory failure which is multifactorial in etiology: Obesity, decreased abdominal compliance, atelectasis, and pain.  I do not think she has pneumonia.   She is now intubated.  Requiring high PEEP/FiO2 which I think is a complication of her body habitus as well as atelectasis. White blood cell count continues to rise.  She does have enterococcus urinary tract infection. Widening antibiotics Assessing for ascites, may consider diagnostic paracentesis Awaiting GI input.  In regards to going to [facility] however I doubt there is much to add here  ASSESSMENT / PLAN:  New onset SIRS, +/- sepsis in setting of progressive Necrotizing Gallstone pancreatitis;  now what is likely multiple pseudocysts  -felt to have passed gallstones at this point  -GI following.  -surgical and IR services consulted.  Neither thing procedural candidate. GI reaching out to [facility] to see if possible endoscopic intervention warranted (seems unlikely).   Plan NPO Await further GI input after speaking w/ [facility] IR does not think pseudocysts can be approached  Day # 2 meropenem, adding IV vancomycin Keep euvolemic  Ascites Plan eval via [place] will consider diagnostic and possibly therapeutic paracentesis   Enterococcus Faecium UTI  Plan Start IV vanc  Acute hypercarbic respiratory failure.  Multifactorial: hypoventilation d/t decreased cAbd from pain, ascites & ileus, worsening atelectasis, small effusions and  possible HCAP;  -suspect narcotics also contributing  -PCXR ett good position. Bibasilar atx and effusions.  Plan Full vent support  Wean PEEP/FIO2 PAD protocol  Goal RASS -2 Will likely need trach Repeat abg  Acute encephalopathy -presume primarily d/t hypercarbia but also worried about evolving sepsis Plan Supportive care PAD protocol  AKI. Had required CRRT back towards the end of January.  -cr bumped;  Plan Hold further lasix Ck bladder pressure May consider paracentesis (if has marked ascites) Renal dose meds  Intermittent fluid and electrolyte imbalance Plan Trend chemistry and replace as indicated.   Protein calorie malnutrition  Plan Cont tpn   Anemia of critical illness Plan Trend cbc Transfuse per protocol   Oral thrush Plan Cont fluconazole   Hyperglycemia Plan ssi   DVT prophylaxis: SCD SUP: PPI  Diet: npo, tpn Activity: BR Disposition : ICU  FAMILY  - Updates:   - Inter-disciplinary family meet or Palliative Care meeting due by:  2/15    Simonne Martinet ACNP-BC [facility] Pulmonary/Critical Care Pager # [phone] OR # [phone] if no answer  11/27/2017, 9:10 AM

## 2017-11-27 NOTE — Progress Notes (Signed)
Tracheal Aspirate obtained/labelled/sent to lab. 

## 2017-11-27 NOTE — Progress Notes (Signed)
PHARMACY - ADULT TOTAL PARENTERAL NUTRITION CONSULT NOTE + ANTIBIOTIC NOTE  Pharmacy Consult for TPN + Meropenem/Vancomycin Indication: severe necrotizing pancreatitis  Patient Measurements: Height: '5\' 10"'  (177.8 cm) Weight: (!) 305 lb 5.4 oz (138.5 kg) IBW/kg (Calculated) : 68.5 TPN AdjBW (KG): 85.3 Body mass index is 43.81 kg/m.  Insulin Requirements:  - 15 units Moderate Novolog SSI / 24hrs - 110 units regular insulin in TPN  Current Nutrition: NPO, TPN at goal  IVF: off 2/4 pm  Central access: R IJ HD catheter TPN start date: 1/24  ASSESSMENT                                                                                                          HPI: 57 y.o. Female with gallstone pancreatitis with necrosis s/p MRCP on 1/15.  Repeat LFTs and lipase normalizing.  No plans for surgical intervention at this time; planning for delayed cholecystectomy per CCS.  Tube feeding was started but had to be stopped on 1/22 due to nausea/vomiting.  Pharmacy is consulted to begin TPN. Patient's course has been complicated with respiratory failure and intubation and subsequent extubation on 11/09/2017.  She was also on CRRT from 1/18-1/23.  Significant events:  1/28: CL diet 1/29: FL diet 1/30: did not tolerate FLD, back to CLD, weight incr 305 > 314  2/2 Remains volume overloaded, but respiratory status is improved.  Avoiding lasix prior to contrast CT early next week.  Tolerating volume and OK to continue to advance towards goal rate per MD. 2/6 repeat CT scan yesterday reveals interval progression of necrotizing pancreatitis.  CXR shows bilateral pleural effusions and airspace disease. With WBC increase, resuming antibiotics with Meropenem for IAI and possible HCAP. PCCM re-consulted and patient re-intubated.  Today: 11/27/2017  Glucose: No hx DM. CBGs elevated since starting TPN but improved with addition of insulin to TPN (CBG 131-159).  Also, getting Boost Breeze supplements occasionally.   Requiring high amount of insulin in TPN to keep CBGs controlled in setting of pancreatitis.  Electrolytes: Phos 5.0, Mag 1.7, K 3.5, CorrCa 10.42 withOUT electrolytes in TPN.  Phos-Calcium product 52.1 (goal <55).  Continue with electrolyte-free TPN bag, Mag and K replacement already ordered by MD this morning.  Renal: AKI on CRRT 1/18-1/23. SCr increasing again as patient clinically worsening.  I/O net -73 mL, still with adequate UOP per recordings yesterday, weight decreased today following lasix.  LFTs: AST/ALT improved to WNL, Alk phos elevated, Tbili low  TGs: 213 (1/15), 332 (1/18), 276 (1/28) 224 (2/4)  Prealbumin: 6.7 (1/25), 9.3 (1/28), 7.1 (2/4)  Antimicrobials this admission: 1/14 Zosyn >> 1/22 1/22 Meropenem >> 1/28, 2/6 >> 2/5 Fluconazole (oropharyngeal candidasis) >> 2/7 Vancomycin >>  Dose adjustments this admission: 1/18 adjust zosyn to 3.375g q6h for 30 min infusions with start of CVVHDF 1/23 Meropenem 1g q12h > q8h since CRRT stopped 2/7 change meropenem to 2g q12h for worsening SCr  Microbiology results: 1/14 MRSA PCR: negative 1/15 Surgical PCR: neg/neg 1/22 BCx: ng-final 2/5 UCx: Enterococcus faecium (S- ampicillin, levaquin, vanc; R- NTF) 2/6  strep ur ag: neg 2/6 legionella ur ag: IP 2/6 BCx: IP 2/7 Trach cx:    NUTRITIONAL GOALS                                                                                           RD recs 2/7: 1500-1704 kcal, >/= 136 grams protein per day  With updated nutrition goals post re-intubation, will remove the daily lipids for now to continue to meet high protein needs without significantly over feeding calories.  New goal with Clinimix 5/15 at 147m/hr provides 132g/day protein, 1874Kcal/day.   PLAN                                                                                                                         Adjust Meropenem to 2g IV q12h. Increased dose to 2g due to severe infection and obesity; however, will  reduce frequency to q12h due to worsening SCr.  Vancomycin 2500 mg IV loading dose. Subsequent doses to be ordered based on levels and SCr trend.  F/u trach cx and blood cultures to determine if MRSA coverage needed since meropenem alone should cover Enterococcus in urine.  Continue Fluconazole 100 mg IV q24h for oropharyngeal Candidiasis dosing.  At 1800 today:  Continue Clinimix 5/15 (NO electrolytes) at goal rate of 110 ml/hr.  Hold lipid emulsion to meet high protein needs without overfeeding kcal in this critically ill obese patient.  TPN to contain standard multivitamins and trace elements  Continue insulin in TPN, increase from 110 units to regular insulin per 24 hr bag.   Continue SSI q4h MODERATE scale  IVF management per MD (currently off).  TPN lab panels on Mondays & Thursdays.   F/u daily.   AHershal Coria PharmD, BCPS Pager: [phone] 14102/04/2018 10:43 AM

## 2017-11-27 NOTE — Progress Notes (Signed)
Nutrition Follow-up  DOCUMENTATION CODES:   Morbid obesity  INTERVENTION:  TPN per Pharmacy.  NUTRITION DIAGNOSIS:   Inadequate oral intake related to inability to eat as evidenced by NPO status. 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 Other associated symptoms include nausea, vomiting and decreased PO intake (since 1/13). Pertinent PMHx significant for nephrolithiasis.   Significant Events: 1/15- MRCP 1/18- intubated and plan for OGT placement and trickle TF  1/19- CRRT started 1/20- extubated and OGT removed 1/21- Panda placed in R nare 1/22- TF stopped d/t N/V 1/23- plan to hold CRRT x1-2 days and assess for renal recovery 1/24- TPN initiation; ILE to be held x7 days 1/25: Pt pulled NGT, refused replacement 1/30: CLD order placed 1/31: Estimated nutrition needs adjusted 2/2: Advancement in TPN rate from 60 mL/hr to 83 mL/hr 2/3 Advancement in TPN rate to goal rate of 110 mL/hr. 2/6: re-intubated and OGT placed   Pt was re-intubated yesterday about 12:15 PM and OGT placed at that time for airway protection and maintenance. OGT in place with 200cc output in canister. Pt with new onset SIRS with possible sepsis, progressive necrotizing gallstone pancreatitis.  Estimated nutrition needs updated based on intubation. Pt with double lumen PICC and receiving Clinimix 5/15 @ 110 mL/hr with 20% ILE @ 20 mL/hr x12 hours. This regimen is providing 132 grams of protein (97% estimated protein need) and 2354 (138% re-estimated kcal need). Spoke with Estill Bamberg, Pharmacist. Plan for Clinimix 5/15 @ 110 mL/hr with removal of ILE. This will provide 132 grams of protein and 1874 kcal (110/% re-estimated kcal need).   Per  Dr. Doyne Keel (GI) not this AM, possible transfer to [facility] if pt is found to be a candidate for endoscopic drainage; she has progressive necrosis with multiple areas of fluid collection. Per Pete's, PCCM NP, note this AM it seems, at this point, that pt may not be a candidate for transfer to [facility] for endoscopic drainage, pt with ascites with plan to consider therapeutic paracentesis, pt will likely need trach, oral thrush present.  Patient is currently intubated on ventilator support MV: 15.1 L/min Temp (24hrs), Avg:100.2 F (37.9 C), Min:98.8 F (37.1 C), Max:101.7 F (38.7 C) Propofol: none BP: 102/51 and MAP: 65  Medications reviewed; sliding scale Novolog, 4 g IV Mg sulfate x1 run today, 40 mg IV Protonix BID, 10 mEq IV KCl x2 runs today. Labs reviewed; CBGs: 136 and 146 mg/dL today.    Drip: Fentanyl @ 75 mcg/hr.    Diet Order:  TPN (CLINIMIX) Adult without lytes  EDUCATION NEEDS:   No education needs have been identified at this time  Skin:  Skin Assessment: Reviewed RN Assessment  Last BM:  2/4  Height:   Ht Readings from Last 1 Encounters:  11/19/17 '5\' 10"'$  (1.778 m)    Weight:   Wt Readings from Last 1 Encounters:  11/27/17 (!) 305 lb 5.4 oz (138.5 kg)    Ideal Body Weight:  65.9 kg  BMI:  Body mass index is 43.81 kg/m.  Estimated Nutritional Needs:   Kcal:  1500-1704 kcal/kg (22-25 kcal/kg IBW)  Protein:  >/= 136 grams (2 grams/kg IBW)  Fluid:  >/= 1.8 L/day  Jarome Matin, MS, RD, LDN, [facility] Inpatient Clinical Dietitian Pager # [phone] After hours/weekend pager # [phone]

## 2017-11-27 NOTE — Progress Notes (Signed)
During AM assessment pt denied pain. She was resting comfortably, no grimacing, no movements indicating discomfort such as restlessness or protection, she was synchronous with the ventilator, and no muscle tension was observed. The patient nods appropriately and denied any pain. Nursing was called into the patient's room by family who was verbally aggressive and stated that the patient was in pain and "they were not going to have that." RN verbalized to family that the pt was on a continuous infusion of fentanyl to aide in keeping her comfortable. At this time the patient was resting comfortably with her eyes closed. There were no indications of discomfort based on clinical observation. Family stated that the patient was not comfortable and she had indicated to them she was in pain. RN woke pt and asked if she was in pain, she denied any discomfort. RN explained observations to family and noted that if the patient began displaying signs of discomfort, the fentanyl drip would be adjusted. Re-iterated to the family that the patient's comfort would be maintained. Family continued to be verbally aggressive. The patient was re-positioned and a k-pad was placed under lower back to aide in comfort. Will continue to monitor patient closely and assess comfort level. Will continue to include family in care plan and allow them to aide in making patient comfortable.

## 2017-11-27 NOTE — Progress Notes (Signed)
[facility] Progress Note     Subjective: CC-  Patient reintubated yesterday due to worsening respiratory failure. Concern for possible pneumonia. Repeat CT scan yesterday showed likely pseudocyst development and necrosis, consistent with severe pancreatitis, no evidence of gas formation or abscess. WBC up to 29.5 today from 25.1 yesterday, TMAX 101.7. Started back on merrem and vancomycin.  Objective: Vital signs in last 24 hours: Temp:  [98.8 F (37.1 C)-101.7 F (38.7 C)] 98.8 F (37.1 C) (02/07 0800) Pulse Rate:  [90-116] 100 (02/07 0700) Resp:  [23-34] 34 (02/07 0700) BP: (76-156)/(18-77) 93/52 (02/07 0700) SpO2:  [93 %-100 %] 95 % (02/07 0824) FiO2 (%):  [50 %-60 %] 50 % (02/07 1127) Weight:  [305 lb 5.4 oz (138.5 kg)] 305 lb 5.4 oz (138.5 kg) (02/07 0949) Last BM Date: 11/24/17  Intake/Output from previous day: 02/06 0701 - 02/07 0700 In: 2236.6 [I.V.:1886.6; IV Piggyback:350] Out: 2310 [Urine:1740; Emesis/NG output:570] Intake/Output this shift: Total I/O In: 10 [I.V.:10] Out: 375 [Urine:375]  Vent: Vent Mode: PRVC FiO2 (%):  [50 %-60 %] 50 % Set Rate:  [25 bmp-34 bmp] 34 bmp Vt Set:  [450 mL] 450 mL PEEP:  [10 cmH20-12 cmH20] 12 cmH20 Plateau Pressure:  [13 cmH20-30 cmH20] 26 cmH20   PE: Gen:  Alert, NAD, sedated HEENT: pupils equal and round, ETT in place Card:  RRR, no M/G/R heard Pulm:  Coarse breath sounds bilaterally, mechanical ventilation Abd: distended and mildly firm, hypoactive BS, does not appear to have any TTP Ext:  No significant lower extremity edema Neuro: sedated  Lab Results:  Recent Labs    11/26/17 0456 11/27/17 0310  WBC 25.1* 29.5*  HGB 7.7* 7.3*  HCT 24.0* 23.2*  PLT 477* 485*   BMET Recent Labs    11/26/17 0456 11/27/17 0310  NA 137 136  K 3.7 3.5  CL 108 108  CO2 23 20*  GLUCOSE 164* 135*  BUN 69* 87*  CREATININE 1.69* 2.53*  CALCIUM 8.4* 8.5*   PT/INR Recent Labs    11/27/17 0310  LABPROT  15.5*  INR 1.24   CMP     Component Value Date/Time   NA 136 11/27/2017 0310   K 3.5 11/27/2017 0310   CL 108 11/27/2017 0310   CO2 20 (L) 11/27/2017 0310   GLUCOSE 135 (H) 11/27/2017 0310   BUN 87 (H) 11/27/2017 0310   CREATININE 2.53 (H) 11/27/2017 0310   CALCIUM 8.5 (L) 11/27/2017 0310   PROT 5.5 (L) 11/27/2017 0310   ALBUMIN 1.6 (L) 11/27/2017 0310   AST 32 11/27/2017 0310   ALT 50 11/27/2017 0310   ALKPHOS 249 (H) 11/27/2017 0310   BILITOT 0.4 11/27/2017 0310   GFRNONAA 20 (L) 11/27/2017 0310   GFRAA 23 (L) 11/27/2017 0310   Lipase     Component Value Date/Time   LIPASE 54 (H) 11/13/2017 1110       Studies/Results: Ct Abdomen Pelvis Wo Contrast  Result Date: 11/25/2017 CLINICAL DATA:  Gallstone pancreatitis.  Evaluate for abscess. EXAM: CT ABDOMEN AND PELVIS WITHOUT CONTRAST TECHNIQUE: Multidetector CT imaging of the abdomen and pelvis was performed following the standard protocol without IV contrast. COMPARISON:  11/11/2017 FINDINGS: Lower chest: Bilateral pleural effusions with overlying airspace consolidation identified left greater than right. Increased from previous exam. Hepatobiliary: No focal liver abnormality. The gallbladder is unremarkable. Pancreas: Evaluation of the pancreas is diminished due to lack of IV contrast material. Compared with the previous exam there appears to have been interval progression of necrotizing  pancreatitis. The neck body and tail of pancreas are no longer distinguishable from the surrounding inflammatory changes. Spleen: Spleen negative. Adrenals/Urinary Tract: Normal appearance of the adrenal glands. The kidneys are unremarkable. No mass or hydronephrosis. Urinary bladder is collapsed around a Foley catheter balloon. Stomach/Bowel: Left upper quadrant pseudocyst has mass effect upon the posterior wall of the stomach. No abnormal dilatation of the gastric lumen. The small bowel loops are nondilated. There is no pathologic dilatation of the  colon. Vascular/Lymphatic: Normal appearance of the abdominal aorta. Multiple prominent mesenteric lymph nodes are identified and appear increased from previous exam, likely reactive. No pelvic or inguinal adenopathy. Reproductive: There is a fibroid arising from the left lateral uterine fundus measuring approximately 4.2 cm. Other: There has been significant interval increase in volume of ascites within the abdomen and pelvis. Although limited due to lack of IV and oral contrast material multiple pseudocyst formation within the abdomen is suspected. The largest is in the central mesentery measuring approximately 8.0 by 11.6 cm, image 65 of series 2. Within the lesser sac there is a suspected pseudocyst measuring 11.2 by 7.1 cm. Loculated fluid along the left pericolic gutter fluid collection along the inferior margin of the spleen in the left kidney measures approximately 12.5 by 8.2 cm. Musculoskeletal: No acute or significant osseous findings. IMPRESSION: 1. Progressive changes of suspected necrotizing pancreatitis identified. 2. Increase in volume of ascites within the abdomen and pelvis. 3. Within the limitations of unenhanced technique multiple fluid collections representing pseudo cysts are suspected within the central mesentery, lesser sac of the left upper quadrant and below the spleen. Electronically Signed   By: Signa Kellaylor  Stroud M.D.   On: 11/25/2017 15:07   Dg Chest 2 View  Result Date: 11/25/2017 CLINICAL DATA:  Patient admitted 10/29/2017 with gallstone pancreatitis which involved into necrotizing pancreatitis. Respiratory distress. EXAM: CHEST  2 VIEW COMPARISON:  Single-view of the chest 11/23/2017 and 11/19/2017. FINDINGS: Right PICC remains in place. Left much greater than right pleural effusions are seen. The patient's left effusion appears increased. Associated basilar atelectasis is noted. There is interstitial pulmonary edema. Heart size is upper normal. IMPRESSION: Left greater than right  pleural effusions and airspace disease. Left effusion appears increased since yesterday's exam. Pulmonary edema persists. Electronically Signed   By: Drusilla Kannerhomas  Dalessio M.D.   On: 11/25/2017 15:01   Dg Abd 1 View  Result Date: 11/26/2017 CLINICAL DATA:  Orogastric tube placement EXAM: ABDOMEN - 1 VIEW COMPARISON:  None. FINDINGS: Orogastric tube with the tip projecting over the antrum of the stomach. There is no bowel dilatation to suggest obstruction. There is no evidence of pneumoperitoneum, portal venous gas or pneumatosis. There are no pathologic calcifications along the expected course of the ureters. The osseous structures are unremarkable. IMPRESSION: Orogastric tube with the tip projecting over the antrum of the stomach. Electronically Signed   By: Elige KoHetal  Patel   On: 11/26/2017 13:39   Dg Chest Port 1 View  Result Date: 11/26/2017 CLINICAL DATA:  Status post intubation. EXAM: PORTABLE CHEST 1 VIEW COMPARISON:  Radiograph of same day. FINDINGS: Stable cardiomegaly and central pulmonary vascular congestion. Endotracheal tube is seen projected over tracheal air shadow with distal tip 5 cm above the carina. Nasogastric tube is seen entering the stomach. Bilateral basilar opacities are noted concerning for atelectasis with associated pleural effusions. No pneumothorax is noted. Bony thorax is unremarkable. IMPRESSION: Endotracheal and nasogastric tubes are in grossly good position. Bibasilar opacities are noted concerning for subsegmental atelectasis with associated pleural  effusions. Electronically Signed   By: Lupita Raider, M.D.   On: 11/26/2017 13:46   Dg Chest Port 1 View  Result Date: 11/26/2017 CLINICAL DATA:  Shortness of Breath EXAM: PORTABLE CHEST 1 VIEW COMPARISON:  November 25, 2017 FINDINGS: Central catheter tip is in the superior vena cava. No pneumothorax. There are pleural effusions bilaterally with patchy bibasilar atelectasis. There is no consolidation or appreciable edema. Heart is  borderline enlarged with pulmonary vascular within normal limits. No adenopathy. No bone lesions. IMPRESSION: Bilateral pleural effusions with bibasilar atelectasis. Stable cardiac prominence. No frank edema or consolidation evident. Central catheter tip in superior vena cava without pneumothorax. Electronically Signed   By: Bretta Bang III M.D.   On: 11/26/2017 08:11    Anti-infectives: Anti-infectives (From admission, onward)   Start     Dose/Rate Route Frequency Ordered Stop   11/27/17 1200  meropenem (MERREM) 2 g in sodium chloride 0.9 % 100 mL IVPB     2 g 200 mL/hr over 30 Minutes Intravenous Every 12 hours 11/27/17 0925     11/27/17 1100  vancomycin (VANCOCIN) 2,500 mg in sodium chloride 0.9 % 500 mL IVPB     2,500 mg 250 mL/hr over 120 Minutes Intravenous  Once 11/27/17 1006     11/26/17 1800  fluconazole (DIFLUCAN) IVPB 100 mg     100 mg 50 mL/hr over 60 Minutes Intravenous Every 24 hours 11/25/17 1708     11/26/17 1200  meropenem (MERREM) 1 g in sodium chloride 0.9 % 100 mL IVPB  Status:  Discontinued     1 g 200 mL/hr over 30 Minutes Intravenous Every 8 hours 11/26/17 0956 11/27/17 0925   11/25/17 1800  fluconazole (DIFLUCAN) IVPB 200 mg     200 mg 100 mL/hr over 60 Minutes Intravenous  Once 11/25/17 1708 11/25/17 1911   11/12/17 1800  meropenem (MERREM) 1 g in sodium chloride 0.9 % 100 mL IVPB  Status:  Discontinued     1 g 200 mL/hr over 30 Minutes Intravenous Every 8 hours 11/12/17 1115 11/17/17 0954   11/11/17 1000  meropenem (MERREM) 1 g in sodium chloride 0.9 % 100 mL IVPB  Status:  Discontinued     1 g 200 mL/hr over 30 Minutes Intravenous Every 12 hours 11/11/17 0911 11/12/17 1115   11/09/17 1000  piperacillin-tazobactam (ZOSYN) IVPB 3.375 g  Status:  Discontinued     3.375 g 12.5 mL/hr over 240 Minutes Intravenous Every 8 hours 11/09/17 0614 11/09/17 0615   11/09/17 1000  piperacillin-tazobactam (ZOSYN) IVPB 3.375 g  Status:  Discontinued     3.375 g 100  mL/hr over 30 Minutes Intravenous Every 6 hours 11/09/17 0616 11/11/17 0911   11/07/17 2200  piperacillin-tazobactam (ZOSYN) IVPB 2.25 g  Status:  Discontinued     2.25 g 100 mL/hr over 30 Minutes Intravenous Every 8 hours 11/07/17 1218 11/07/17 1741   11/07/17 2200  piperacillin-tazobactam (ZOSYN) IVPB 3.375 g  Status:  Discontinued     3.375 g 12.5 mL/hr over 240 Minutes Intravenous Every 6 hours 11/07/17 1741 11/07/17 1747   11/07/17 2200  piperacillin-tazobactam (ZOSYN) IVPB 3.375 g  Status:  Discontinued     3.375 g 100 mL/hr over 30 Minutes Intravenous Every 6 hours 11/07/17 1747 11/09/17 0613   11/04/17 1100  piperacillin-tazobactam (ZOSYN) IVPB 3.375 g  Status:  Discontinued     3.375 g 12.5 mL/hr over 240 Minutes Intravenous Every 8 hours 11/04/17 1059 11/07/17 1218   11/07/2017 1200  piperacillin-tazobactam (  ZOSYN) IVPB 3.375 g     3.375 g 100 mL/hr over 30 Minutes Intravenous  Once 11/07/2017 1157 10/25/2017 1309       Assessment/Plan ARF - required CRRT last month, Cr up to 2.53 today. Checking bladder pressure to r/o abdominal compartment syndrome VDRF - extubated 1/20, reintubated 2/6 Obesity Nephrolithiasis Oral thrush - on fluconazole Protein calorie malnutrition Anemia of critical disease Enterococcus faecium UTI - on IV vancomycin  Gallstone pancreatitis - CT 2/6 showed likely pseudocyst development and necrosis, consistent with severe pancreatitis, no evidence of gas formation or abscess - started back on merrem and vancomycin 2/6 - GI has reached out to MD at [facility] who does not feel there is any role for endoscopic drainage/therapy given diffuse nature of the necrosis and fluid collections - currently would not recommend any perc drainage due to risk of causing infection - if patient persistently declines could consider perc drainage as last resort to rule out abscess. Repeat CBC in AM. - ultimately will consider surgical cystgastrostomy at 6 weeks if pseudocyst  unresolved - WBC 29.5 today from 25.1, TMAX 101.7 - palliative care following  FEN: NPO; TPN VTE: SCDs,SQ heparin ID: meropenem 2/6>>, vancomycin 2/6>>, fluconazole 2/5>>    LOS: 24 days    Franne Forts , [facility] 11/27/2017, 11:33 AM Pager: [phone] Consults: [phone] Mon-Fri 7:00 am-4:30 pm Sat-Sun 7:00 am-11:30 am

## 2017-11-27 NOTE — Progress Notes (Signed)
Provided support and updated patient's brother Casimiro NeedleMichael at bedside. Goals are for her comfort and they hope with time, aggressive antibiotics and supportive care that she can recover. Abx broadened to include HCAP. Family grieving appropriately over this setback. I have discussed her care with Surgery, GI and CCM.   I am recommending deeper sedation while on the vent for her comfort and to reduce stress on her body while we continue to support her.  We will continue to follow.  Anderson MaltaElizabeth Ajani Rineer, DO Palliative Medicine  Time: 45 minutes Greater than 50%  of this time was spent counseling and coordinating care related to the above assessment and plan.

## 2017-11-27 NOTE — Progress Notes (Addendum)
Progress Note   Subjective  Patient intubated. Had a fever overnight, rising WBC to 29. Cr also rising.    Objective   Vital signs in last 24 hours: Temp:  [99.6 F (37.6 C)-101.7 F (38.7 C)] 99.6 F (37.6 C) (02/07 0353) Pulse Rate:  [90-116] 100 (02/07 0700) Resp:  [23-34] 34 (02/07 0700) BP: (76-156)/(18-77) 93/52 (02/07 0700) SpO2:  [93 %-100 %] 100 % (02/07 0700) FiO2 (%):  [30 %-60 %] 60 % (02/07 0448) Weight:  [305 lb 5.4 oz (138.5 kg)] 305 lb 5.4 oz (138.5 kg) (02/07 0300) Last BM Date: 11/24/17 General:    white female intubated Heart:  tachycardic Lungs: intubated, course BS Abdomen:  Protuberant, distended,  Neurologic:  Intubated, sedated   Intake/Output from previous day: 02/06 0701 - 02/07 0700 In: 2236.6 [I.V.:1886.6; IV Piggyback:350] Out: 2310 [Urine:1740; Emesis/NG output:570] Intake/Output this shift: No intake/output data recorded.  Lab Results: Recent Labs    11/25/17 0825 11/26/17 0456 11/27/17 0310  WBC 22.6* 25.1* 29.5*  HGB 7.1* 7.7* 7.3*  HCT 26.4* 24.0* 23.2*  PLT 427* 477* 485*   BMET Recent Labs    11/25/17 0500 11/26/17 0456 11/27/17 0310  NA 136 137 136  K 3.5 3.7 3.5  CL 108 108 108  CO2 21* 23 20*  GLUCOSE 160* 164* 135*  BUN 67* 69* 87*  CREATININE 1.67* 1.69* 2.53*  CALCIUM 8.2* 8.4* 8.5*   LFT Recent Labs    11/27/17 0310  PROT 5.5*  ALBUMIN 1.6*  AST 32  ALT 50  ALKPHOS 249*  BILITOT 0.4   PT/INR Recent Labs    11/27/17 0310  LABPROT 15.5*  INR 1.24    Studies/Results: Ct Abdomen Pelvis Wo Contrast  Result Date: 11/25/2017 CLINICAL DATA:  Gallstone pancreatitis.  Evaluate for abscess. EXAM: CT ABDOMEN AND PELVIS WITHOUT CONTRAST TECHNIQUE: Multidetector CT imaging of the abdomen and pelvis was performed following the standard protocol without IV contrast. COMPARISON:  11/11/2017 FINDINGS: Lower chest: Bilateral pleural effusions with overlying airspace consolidation identified left greater  than right. Increased from previous exam. Hepatobiliary: No focal liver abnormality. The gallbladder is unremarkable. Pancreas: Evaluation of the pancreas is diminished due to lack of IV contrast material. Compared with the previous exam there appears to have been interval progression of necrotizing pancreatitis. The neck body and tail of pancreas are no longer distinguishable from the surrounding inflammatory changes. Spleen: Spleen negative. Adrenals/Urinary Tract: Normal appearance of the adrenal glands. The kidneys are unremarkable. No mass or hydronephrosis. Urinary bladder is collapsed around a Foley catheter balloon. Stomach/Bowel: Left upper quadrant pseudocyst has mass effect upon the posterior wall of the stomach. No abnormal dilatation of the gastric lumen. The small bowel loops are nondilated. There is no pathologic dilatation of the colon. Vascular/Lymphatic: Normal appearance of the abdominal aorta. Multiple prominent mesenteric lymph nodes are identified and appear increased from previous exam, likely reactive. No pelvic or inguinal adenopathy. Reproductive: There is a fibroid arising from the left lateral uterine fundus measuring approximately 4.2 cm. Other: There has been significant interval increase in volume of ascites within the abdomen and pelvis. Although limited due to lack of IV and oral contrast material multiple pseudocyst formation within the abdomen is suspected. The largest is in the central mesentery measuring approximately 8.0 by 11.6 cm, image 65 of series 2. Within the lesser sac there is a suspected pseudocyst measuring 11.2 by 7.1 cm. Loculated fluid along the left pericolic gutter fluid collection along the inferior  margin of the spleen in the left kidney measures approximately 12.5 by 8.2 cm. Musculoskeletal: No acute or significant osseous findings. IMPRESSION: 1. Progressive changes of suspected necrotizing pancreatitis identified. 2. Increase in volume of ascites within the  abdomen and pelvis. 3. Within the limitations of unenhanced technique multiple fluid collections representing pseudo cysts are suspected within the central mesentery, lesser sac of the left upper quadrant and below the spleen. Electronically Signed   By: Signa Kell M.D.   On: 11/25/2017 15:07   Dg Chest 2 View  Result Date: 11/25/2017 CLINICAL DATA:  Patient admitted Nov 06, 2017 with gallstone pancreatitis which involved into necrotizing pancreatitis. Respiratory distress. EXAM: CHEST  2 VIEW COMPARISON:  Single-view of the chest 11/23/2017 and 11/19/2017. FINDINGS: Right PICC remains in place. Left much greater than right pleural effusions are seen. The patient's left effusion appears increased. Associated basilar atelectasis is noted. There is interstitial pulmonary edema. Heart size is upper normal. IMPRESSION: Left greater than right pleural effusions and airspace disease. Left effusion appears increased since yesterday's exam. Pulmonary edema persists. Electronically Signed   By: Drusilla Kanner M.D.   On: 11/25/2017 15:01   Dg Abd 1 View  Result Date: 11/26/2017 CLINICAL DATA:  Orogastric tube placement EXAM: ABDOMEN - 1 VIEW COMPARISON:  None. FINDINGS: Orogastric tube with the tip projecting over the antrum of the stomach. There is no bowel dilatation to suggest obstruction. There is no evidence of pneumoperitoneum, portal venous gas or pneumatosis. There are no pathologic calcifications along the expected course of the ureters. The osseous structures are unremarkable. IMPRESSION: Orogastric tube with the tip projecting over the antrum of the stomach. Electronically Signed   By: Elige Ko   On: 11/26/2017 13:39   Dg Chest Port 1 View  Result Date: 11/26/2017 CLINICAL DATA:  Status post intubation. EXAM: PORTABLE CHEST 1 VIEW COMPARISON:  Radiograph of same day. FINDINGS: Stable cardiomegaly and central pulmonary vascular congestion. Endotracheal tube is seen projected over tracheal air shadow  with distal tip 5 cm above the carina. Nasogastric tube is seen entering the stomach. Bilateral basilar opacities are noted concerning for atelectasis with associated pleural effusions. No pneumothorax is noted. Bony thorax is unremarkable. IMPRESSION: Endotracheal and nasogastric tubes are in grossly good position. Bibasilar opacities are noted concerning for subsegmental atelectasis with associated pleural effusions. Electronically Signed   By: Lupita Raider, M.D.   On: 11/26/2017 13:46   Dg Chest Port 1 View  Result Date: 11/26/2017 CLINICAL DATA:  Shortness of Breath EXAM: PORTABLE CHEST 1 VIEW COMPARISON:  November 25, 2017 FINDINGS: Central catheter tip is in the superior vena cava. No pneumothorax. There are pleural effusions bilaterally with patchy bibasilar atelectasis. There is no consolidation or appreciable edema. Heart is borderline enlarged with pulmonary vascular within normal limits. No adenopathy. No bone lesions. IMPRESSION: Bilateral pleural effusions with bibasilar atelectasis. Stable cardiac prominence. No frank edema or consolidation evident. Central catheter tip in superior vena cava without pneumothorax. Electronically Signed   By: Bretta Bang III M.D.   On: 11/26/2017 08:11       Assessment / Plan:   57 y/o female with severe necrotizing biliary pancreatitis - prolonged hospital course, previously with AKI and intubated for respiratory distress, interval worsening in the past 48 hours with rising WBC, respiratory failure and reintubation, now with rising AKI. Repeat CT scan shows progressive necrosis with multiple fluid collections. Unclear if rising WBC represents infected necrosis / fluid collections or hospital acquired pneumonia, or other.  She is critically ill. Antibiotics resumed, and given her fever and rising WBC I would add vancomycin / broaden, defer to primary service regarding choice. IR did not feel fluid could be easily sampled / drained. Hoping to avoid  surgery if possible as this would be a very difficult / risky intervention. She has multiple fluid collections with extensive necrosis. I am going [facility] to see if they would be able to offer anything at this point for this patient, and discuss possible transfer. Typically endoscopic drainage would be for a localized collection that has matured - given the diffuse nature of her necrosis and multiple collections, I don't think this would be recommended, but I will discuss her candidacy for endoscopic drainage with [facility]. I have called them this morning and awaiting to hear back, will update family when I hear more.   Please call with questions.   ADDENDUM: [facility]owell [facility] and discussed this case at length. He does not feel there is any role for endoscopic drainage / therapy given the diffuse nature of the necrosis and fluid collections. In that light, they have nothing additional to offer her at their center. If there was any dominant fluid collection that can be drained by IR to ensure no abscess that could be helpful, otherwise continue supportive care.    Ileene PatrickSteven Thadd Apuzzo, MD [facility] Pager [phone]

## 2017-11-28 ENCOUNTER — Inpatient Hospital Stay (HOSPITAL_COMMUNITY): Payer: BLUE CROSS/BLUE SHIELD

## 2017-11-28 DIAGNOSIS — K8591 Acute pancreatitis with uninfected necrosis, unspecified: Secondary | ICD-10-CM

## 2017-11-28 LAB — GLUCOSE, CAPILLARY
GLUCOSE-CAPILLARY: 136 mg/dL — AB (ref 65–99)
GLUCOSE-CAPILLARY: 127 mg/dL — AB (ref 65–99)
Glucose-Capillary: 132 mg/dL — ABNORMAL HIGH (ref 65–99)
Glucose-Capillary: 139 mg/dL — ABNORMAL HIGH (ref 65–99)
Glucose-Capillary: 132 mg/dL — ABNORMAL HIGH (ref 65–99)
Glucose-Capillary: 156 mg/dL — ABNORMAL HIGH (ref 65–99)

## 2017-11-28 LAB — BASIC METABOLIC PANEL
ANION GAP: 7 (ref 5–15)
BUN: 100 mg/dL — AB (ref 6–20)
CO2: 18 mmol/L — ABNORMAL LOW (ref 22–32)
Calcium: 8.5 mg/dL — ABNORMAL LOW (ref 8.9–10.3)
Chloride: 108 mmol/L (ref 101–111)
Creatinine, Ser: 2.47 mg/dL — ABNORMAL HIGH (ref 0.44–1.00)
GFR, EST AFRICAN AMERICAN: 24 mL/min — AB (ref >60)
GFR, EST NON AFRICAN AMERICAN: 21 mL/min — AB (ref >60)
Glucose, Bld: 132 mg/dL — ABNORMAL HIGH (ref 65–99)
POTASSIUM: 3.5 mmol/L (ref 3.5–5.1)
SODIUM: 133 mmol/L — AB (ref 135–145)

## 2017-11-28 LAB — MAGNESIUM: MAGNESIUM: 2.2 mg/dL (ref 1.7–2.4)

## 2017-11-28 LAB — CBC
HCT: 20.4 % — ABNORMAL LOW (ref 36.0–46.0)
HEMOGLOBIN: 6.7 g/dL — AB (ref 12.0–15.0)
MCH: 30 pg (ref 26.0–34.0)
MCHC: 32.8 g/dL (ref 30.0–36.0)
MCV: 91.5 fL (ref 78.0–100.0)
Platelets: 466 10*3/uL — ABNORMAL HIGH (ref 150–400)
RBC: 2.23 MIL/uL — ABNORMAL LOW (ref 3.87–5.11)
RDW: 16.4 % — AB (ref 11.5–15.5)
WBC: 27.1 10*3/uL — AB (ref 4.0–10.5)

## 2017-11-28 LAB — PREPARE RBC (CROSSMATCH)

## 2017-11-28 LAB — HEMOGLOBIN AND HEMATOCRIT, BLOOD
HCT: 25 % — ABNORMAL LOW (ref 36.0–46.0)
Hemoglobin: 8.2 g/dL — ABNORMAL LOW (ref 12.0–15.0)

## 2017-11-28 LAB — ABO/RH: ABO/RH(D): A NEG

## 2017-11-28 MED ORDER — FLUCONAZOLE IN SODIUM CHLORIDE 200-0.9 MG/100ML-% IV SOLN
200.0000 mg | INTRAVENOUS | Status: AC
  Administered 2017-11-28 – 2017-11-30 (×3): 200 mg via INTRAVENOUS
  Filled 2017-11-28 (×3): qty 100

## 2017-11-28 MED ORDER — SODIUM CHLORIDE 0.9 % IV SOLN: Freq: Once | INTRAVENOUS | Status: DC

## 2017-11-28 MED ORDER — DEXTROSE 50 % IV SOLN
INTRAVENOUS | Status: AC
  Filled 2017-11-28: qty 50

## 2017-11-28 MED ORDER — POTASSIUM CHLORIDE 10 MEQ/100ML IV SOLN
10.0000 meq | INTRAVENOUS | Status: AC
  Administered 2017-11-28 (×2): 10 meq via INTRAVENOUS
  Filled 2017-11-28 (×2): qty 100

## 2017-11-28 MED ORDER — DIPHENHYDRAMINE HCL 50 MG/ML IJ SOLN
25.0000 mg | Freq: Once | INTRAMUSCULAR | Status: AC
  Administered 2017-11-28: 25 mg via INTRAVENOUS
  Filled 2017-11-28: qty 1

## 2017-11-28 MED ORDER — TRACE MINERALS CR-CU-MN-SE-ZN 10-1000-500-60 MCG/ML IV SOLN
INTRAVENOUS | Status: AC
  Administered 2017-11-28: 18:00:00 via INTRAVENOUS
  Filled 2017-11-28: qty 2000

## 2017-11-28 MED ORDER — DIPHENHYDRAMINE HCL 50 MG/ML IJ SOLN
12.5000 mg | Freq: Four times a day (QID) | INTRAMUSCULAR | Status: DC
  Administered 2017-11-28 – 2017-11-29 (×7): 12.5 mg via INTRAVENOUS
  Filled 2017-11-28 (×7): qty 1

## 2017-11-28 NOTE — Progress Notes (Signed)
Palliative Care Follow-up Note  Met with family including Legrand Como her brother, Creedence's mother and sister-in-law. I answered their questions and provided support. I acknowledged the uncertainty and unknowns moving forward and continued to be cautious in prognostication because her mortality risk is extremely high and likely hood of recovery low. We have agreed on seeing how things go through the weekend and meeting again on Monday to determine next steps- if her condition continues to deteriorate I will recommend a transition to comfort care and re-address her code status in the context of ongoing critical illness.   I completed FMLA and disability paperwork for her and gave completed documents to her brother Legrand Como.  Symptoms: Itching, source unclear (on scheduled benedryl), agitation of critical illness (precedex and fentanyl infusions)-focus on her comfort in addition to full scope treatment.   Time: 35 min Greater than 50%  of this time was spent counseling and coordinating care related to the above assessment and plan.  Lane Hacker, DO Palliative Medicine

## 2017-11-28 NOTE — Progress Notes (Signed)
[facility] Progress Note     Subjective: CC-  Patient reintubated yesterday due to worsening respiratory failure. Concern for possible pneumonia.  Repeat CT scan yesterday showed likely ascites & early pseudocysts development and pancreatic necrosis, consistent with severe pancreatitis, no evidence of gas formation or abscess.  Started back on merrem and vancomycin.  Pt moving arms, mildly agitated.  ICU RN in room, treating/consoling  Objective: Vital signs in last 24 hours: Temp:  [97.8 F (36.6 C)-100 F (37.8 C)] 98.8 F (37.1 C) (02/08 0830) Pulse Rate:  [53-103] 53 (02/08 0900) Resp:  [25-35] 34 (02/08 0900) BP: (80-174)/(26-115) 157/67 (02/08 0900) SpO2:  [93 %-100 %] 97 % (02/08 0900) FiO2 (%):  [50 %] 50 % (02/08 0815) Weight:  [138.5 kg (305 lb 5.4 oz)] 138.5 kg (305 lb 5.4 oz) (02/07 0949) Last BM Date: 11/24/17  Intake/Output from previous day: 02/07 0701 - 02/08 0700 In: 2976.1 [I.V.:2716.1; IV Piggyback:250] Out: 3150 [Urine:1850; Emesis/NG output:650] Intake/Output this shift: Total I/O In: 30 [Blood:30] Out: -   Vent: Vent Mode: PRVC FiO2 (%):  [50 %] 50 % Set Rate:  [34 bmp-35 bmp] 34 bmp Vt Set:  [450 mL] 450 mL PEEP:  [8 cmH20-12 cmH20] 8 cmH20 Plateau Pressure:  [24 cmH20-27 cmH20] 24 cmH20   PE: General: Pt awake in moderate acute distress Eyes: PERRL, normal EOM. Sclera nonicteric Neuro: CN II-XII intact w/o focal sensory/motor deficits. Lymph: No head/neck/groin lymphadenopathy Psych:  No delerium/psychosis/paranoia HENT: Normocephalic, Mucus membranes moist.  No thrush.  ETT in place.  NGT TINY - flushes poorly Neck: Supple, No tracheal deviation Chest: No pain.  Good respiratory excursion. CV:  Pulses intact.  Regular rhythm MS: Normal AROM mjr joints.  No obvious deformity Abdomen: Somewhat firm, obese, moderately distended. Ext:  SCDs BLE.  No significant edema.  No cyanosis Skin: No petechiae / purpura   Lab  Results:  Recent Labs    11/27/17 0310 11/28/17 0315  WBC 29.5* 27.1*  HGB 7.3* 6.7*  HCT 23.2* 20.4*  PLT 485* 466*   BMET Recent Labs    11/27/17 0310 11/28/17 0315  NA 136 133*  K 3.5 3.5  CL 108 108  CO2 20* 18*  GLUCOSE 135* 132*  BUN 87* 100*  CREATININE 2.53* 2.47*  CALCIUM 8.5* 8.5*   PT/INR Recent Labs    11/27/17 0310  LABPROT 15.5*  INR 1.24   CMP     Component Value Date/Time   NA 133 (L) 11/28/2017 0315   K 3.5 11/28/2017 0315   CL 108 11/28/2017 0315   CO2 18 (L) 11/28/2017 0315   GLUCOSE 132 (H) 11/28/2017 0315   BUN 100 (H) 11/28/2017 0315   CREATININE 2.47 (H) 11/28/2017 0315   CALCIUM 8.5 (L) 11/28/2017 0315   PROT 5.5 (L) 11/27/2017 0310   ALBUMIN 1.6 (L) 11/27/2017 0310   AST 32 11/27/2017 0310   ALT 50 11/27/2017 0310   ALKPHOS 249 (H) 11/27/2017 0310   BILITOT 0.4 11/27/2017 0310   GFRNONAA 21 (L) 11/28/2017 0315   GFRAA 24 (L) 11/28/2017 0315   Lipase     Component Value Date/Time   LIPASE 54 (H) 11/13/2017 1110       Studies/Results: Dg Abd 1 View  Result Date: 11/26/2017 CLINICAL DATA:  Orogastric tube placement EXAM: ABDOMEN - 1 VIEW COMPARISON:  None. FINDINGS: Orogastric tube with the tip projecting over the antrum of the stomach. There is no bowel dilatation to suggest obstruction. There is no evidence  of pneumoperitoneum, portal venous gas or pneumatosis. There are no pathologic calcifications along the expected course of the ureters. The osseous structures are unremarkable. IMPRESSION: Orogastric tube with the tip projecting over the antrum of the stomach. Electronically Signed   By: Elige KoHetal  Patel   On: 11/26/2017 13:39   Dg Chest Port 1 View  Result Date: 11/26/2017 CLINICAL DATA:  Status post intubation. EXAM: PORTABLE CHEST 1 VIEW COMPARISON:  Radiograph of same day. FINDINGS: Stable cardiomegaly and central pulmonary vascular congestion. Endotracheal tube is seen projected over tracheal air shadow with distal tip 5 cm  above the carina. Nasogastric tube is seen entering the stomach. Bilateral basilar opacities are noted concerning for atelectasis with associated pleural effusions. No pneumothorax is noted. Bony thorax is unremarkable. IMPRESSION: Endotracheal and nasogastric tubes are in grossly good position. Bibasilar opacities are noted concerning for subsegmental atelectasis with associated pleural effusions. Electronically Signed   By: Lupita RaiderJames  Green Jr, M.D.   On: 11/26/2017 13:46    Anti-infectives: Anti-infectives (From admission, onward)   Start     Dose/Rate Route Frequency Ordered Stop   11/28/17 1800  fluconazole (DIFLUCAN) IVPB 200 mg     200 mg 100 mL/hr over 60 Minutes Intravenous Every 24 hours 11/28/17 0740     11/27/17 1200  meropenem (MERREM) 2 g in sodium chloride 0.9 % 100 mL IVPB     2 g 200 mL/hr over 30 Minutes Intravenous Every 12 hours 11/27/17 0925     11/27/17 1100  vancomycin (VANCOCIN) 2,500 mg in sodium chloride 0.9 % 500 mL IVPB     2,500 mg 250 mL/hr over 120 Minutes Intravenous  Once 11/27/17 1006 11/27/17 1325   11/26/17 1800  fluconazole (DIFLUCAN) IVPB 100 mg  Status:  Discontinued     100 mg 50 mL/hr over 60 Minutes Intravenous Every 24 hours 11/25/17 1708 11/28/17 0740   11/26/17 1200  meropenem (MERREM) 1 g in sodium chloride 0.9 % 100 mL IVPB  Status:  Discontinued     1 g 200 mL/hr over 30 Minutes Intravenous Every 8 hours 11/26/17 0956 11/27/17 0925   11/25/17 1800  fluconazole (DIFLUCAN) IVPB 200 mg     200 mg 100 mL/hr over 60 Minutes Intravenous  Once 11/25/17 1708 11/25/17 1911   11/12/17 1800  meropenem (MERREM) 1 g in sodium chloride 0.9 % 100 mL IVPB  Status:  Discontinued     1 g 200 mL/hr over 30 Minutes Intravenous Every 8 hours 11/12/17 1115 11/17/17 0954   11/11/17 1000  meropenem (MERREM) 1 g in sodium chloride 0.9 % 100 mL IVPB  Status:  Discontinued     1 g 200 mL/hr over 30 Minutes Intravenous Every 12 hours 11/11/17 0911 11/12/17 1115    11/09/17 1000  piperacillin-tazobactam (ZOSYN) IVPB 3.375 g  Status:  Discontinued     3.375 g 12.5 mL/hr over 240 Minutes Intravenous Every 8 hours 11/09/17 0614 11/09/17 0615   11/09/17 1000  piperacillin-tazobactam (ZOSYN) IVPB 3.375 g  Status:  Discontinued     3.375 g 100 mL/hr over 30 Minutes Intravenous Every 6 hours 11/09/17 0616 11/11/17 0911   11/07/17 2200  piperacillin-tazobactam (ZOSYN) IVPB 2.25 g  Status:  Discontinued     2.25 g 100 mL/hr over 30 Minutes Intravenous Every 8 hours 11/07/17 1218 11/07/17 1741   11/07/17 2200  piperacillin-tazobactam (ZOSYN) IVPB 3.375 g  Status:  Discontinued     3.375 g 12.5 mL/hr over 240 Minutes Intravenous Every 6 hours 11/07/17 1741  11/07/17 1747   11/07/17 2200  piperacillin-tazobactam (ZOSYN) IVPB 3.375 g  Status:  Discontinued     3.375 g 100 mL/hr over 30 Minutes Intravenous Every 6 hours 11/07/17 1747 11/09/17 0613   11/04/17 1100  piperacillin-tazobactam (ZOSYN) IVPB 3.375 g  Status:  Discontinued     3.375 g 12.5 mL/hr over 240 Minutes Intravenous Every 8 hours 11/04/17 1059 11/07/17 1218   10/28/2017 1200  piperacillin-tazobactam (ZOSYN) IVPB 3.375 g     3.375 g 100 mL/hr over 30 Minutes Intravenous  Once 10/29/2017 1157 10/29/2017 1309       Assessment/Plan ARF - required CRRT last month, Cr up to 2.53 today. 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 - CT 2/6 showed likely pseudocyst development and necrosis, consistent with severe pancreatitis, no evidence of gas formation or abscess - started back on merrem and vancomycin 2/6   Mortality risk high w ex lap/debridement.  -currently would not recommend any perc drainage  due to risk of causing infection unless abd compartment syndrome & exhausted other non procedure options.  D/w IR last night  - if patient persistently declines could consider perc drainage as last resort to rule out abscess.  - ultimately will consider surgical cystgastrostomy at 6 weeks if pseudocyst unresolved & patient survives.  Dr Michaell Cowing can do lap assisted transgastric pseudocyst gastrostomy w interval lap chole.  - GI has reached out to MD at [facility] who does not feel there is any role for endoscopic drainage/therapy given diffuse nature of the necrosis and fluid collections - too early anyway.  - palliative care following.  D/w CCM.  Several weeks into this.  Prognosis poor  FEN: NPO; TPN VTE: SCDs,SQ heparin ID: meropenem 2/6>>, vancomycin 2/6>>, fluconazole 2/5>>    LOS: 25 days   Ardeth Sportsman, M.D., F.A.C.S. Gastrointestinal and Minimally Invasive Surgery [facility] [address], [address] [place], [place] [address] [phone] Main / Paging

## 2017-11-28 NOTE — Progress Notes (Signed)
Date: November 28, 2017 Marcelle SmilingRhonda Davis, BSN, LodiRN3, ConnecticutCCM [phone] Chart and notes review for patient progress and needs./reintubated on [id number] Will follow for case management and discharge needs. No cm or discharge needs present at time of this review. Next review date: [id number]

## 2017-11-28 NOTE — Progress Notes (Signed)
PULMONARY / CRITICAL CARE MEDICINE   Name: Emma Washington MRN: [id number] DOB: Mar 19, 1961    ADMISSION DATE:  Dec 01, 2017 CONSULTATION DATE:  2/6 REFERRING MD:  Janee Morn  CHIEF COMPLAINT:  Progressive respiratory failure   HISTORY OF PRESENT ILLNESS:   57 year old female who was initially admitted on 1/14 with gallstone pancreatitis and resultant necrotizing pancreatitis.  She has had a prolonged and complicated hospital course: This included progressive respiratory failure in the setting of what was felt to be acute lung injury, requiring intubation on 1/19, acute renal failure in the setting of organ hypoperfusion, and nonsteroidals requiring CRRT, she was extubated on 1/20 and at that time critical care had signed off.  Subsequently developed worsening respiratory dysfunction felt to be secondary to volume overload and pulmonary edema requiring transfer to the stepdown unit on 1/30.  She was also having worsening abdominal pain.  Because of this a repeat CT scan was obtained showing worsening necrotizing pancreatitis.  During the evening hours of 2/5 patient became progressively short of breath, had notable increasing audible wheezes and respiratory rate in the 35-45 range.  Initial arterial blood gases obtained showed pH of 7.19, PCO2 of 53, and oxygen saturations of 122 with bicarbonate of 19.4.  She was placed on noninvasive positive pressure ventilation which did improve her gas exchange.  Chest x-ray showed bibasilar atelectasis/effusions.  Critical care has been asked to see the a.m. of 2/6 due to progressive weakness, BiPAP dependence, and concern about clinical decompensation   SUBJECTIVE:  Sedated  VITAL SIGNS: BP (!) 157/67   Pulse (!) 53   Temp 98.8 F (37.1 C) (Temporal)   Resp (!) 34   Ht 5\' 10"  (1.778 m)   Wt (!) 305 lb 5.4 oz (138.5 kg)   LMP 11/07/2011   SpO2 97%   BMI 43.81 kg/m   HEMODYNAMICS: CVP:  [9 mmHg-15 mmHg] 15 mmHg  VENTILATOR SETTINGS: Vent Mode:  PRVC FiO2 (%):  [50 %] 50 % Set Rate:  [34 bmp-35 bmp] 34 bmp Vt Set:  [450 mL] 450 mL PEEP:  [8 cmH20-12 cmH20] 8 cmH20 Plateau Pressure:  [24 cmH20-27 cmH20] 24 cmH20  INTAKE / OUTPUT:  Intake/Output Summary (Last 24 hours) at 11/28/2017 4098 Last data filed at 11/28/2017 0830 Gross per 24 hour  Intake 3006.08 ml  Output 3000 ml  Net 6.08 ml     PHYSICAL EXAMINATION: Chronically ill-appearing 57 year old female currently sedated and intubated HEENT: Normocephalic atraumatic orally intubated Pulmonary: Scattered rhonchi decreased bases equal chest rise no accessory use on ventilator Cardiac: Regular rate and rhythm Abdomen: Distended, firm, hypoactive Extremities: No significant edema brisk cap refill warm, pulses strong Neuro/psych, follows commands, generalized weakness  LABS:  BMET Recent Labs  Lab 11/26/17 0456 11/27/17 0310 11/28/17 0315  NA 137 136 133*  K 3.7 3.5 3.5  CL 108 108 108  CO2 23 20* 18*  BUN 69* 87* 100*  CREATININE 1.69* 2.53* 2.47*  GLUCOSE 164* 135* 132*    Electrolytes Recent Labs  Lab 11/24/17 0446 11/25/17 0500 11/26/17 0456 11/27/17 0310 11/28/17 0315  CALCIUM 8.1* 8.2* 8.4* 8.5* 8.5*  MG 1.6* 2.0  --  1.7 2.2  PHOS 3.7 4.1  --  5.0*  --     CBC Recent Labs  Lab 11/26/17 0456 11/27/17 0310 11/28/17 0315  WBC 25.1* 29.5* 27.1*  HGB 7.7* 7.3* 6.7*  HCT 24.0* 23.2* 20.4*  PLT 477* 485* 466*    Coag's Recent Labs  Lab 11/27/17 0310  INR 1.24  Sepsis Markers Recent Labs  Lab 11/26/17 1332  LATICACIDVEN 0.8    ABG Recent Labs  Lab 11/26/17 0800 11/26/17 1301 11/27/17 0945  PHART 7.246* 7.196* 7.223*  PCO2ART 48.7* 55.3* 44.7  PO2ART 98.2 68.4* 91.7    Liver Enzymes Recent Labs  Lab 11/24/17 0446 11/26/17 0456 11/27/17 0310  AST 53* 39 32  ALT 66* 64* 50  ALKPHOS 283* 273* 249*  BILITOT 0.3 0.1* 0.4  ALBUMIN 1.6* 1.6* 1.6*    Cardiac Enzymes No results for input(s): TROPONINI, PROBNP in the  last 168 hours.  Glucose Recent Labs  Lab 11/27/17 1132 11/27/17 1538 11/27/17 1935 11/27/17 2322 11/28/17 0333 11/28/17 0750  GLUCAP 150* 139* 127* 136* 132* 139*    Imaging No results found.   STUDIES:  CT abd/pelvis 2/5: 1. Progressive changes of suspected necrotizing pancreatitis Identified. 2. Increase in volume of ascites within the abdomen and pelvis. 3. Within the limitations of unenhanced technique multiple fluid collections representing pseudo cysts are suspected within the central mesentery, lesser sac of the left upper quadrant and below the spleen.  CULTURES:  UC 2/5>>> enterococcus BCX2 2/6>>>  ANTIBIOTICS: Meropenem 2/6>>> Fluconazole 2/5>>> Vancomycin 2/6>>>  SIGNIFICANT EVENTS:   LINES/TUBES:  RUE picc 1/26>>>  O ETT 2/6>>>  DISCUSSION: 57 year old female patient with prolonged hospital course after being admitted with gallstone pancreatitis and now progressive necrotizing pancreatitis with concern for multiple pseudocysts.  Worsening Sirs physiology, cannot exclude systemic infection.  Now further complicated by hypercarbic respiratory failure which is multifactorial in etiology: Obesity, decreased abdominal compliance, atelectasis, and pain.  I do not think she has pneumonia.   She is now intubated.  Requiring high PEEP/FiO2 which I think is a complication of her body habitus as well as atelectasis. White blood cell count down today  She does have enterococcus urinary tract infection. Widening antibiotics Assessing for ascites, may consider diagnostic paracentesis Awaiting GI input.  In regards to going to [facility] however I doubt there is much to add here I don't think we've helped her here. Chances for recovery poor.   ASSESSMENT / PLAN:  New onset SIRS, +/- sepsis in setting of progressive Necrotizing Gallstone pancreatitis;  now what is likely multiple pseudocysts  -felt to have passed gallstones at this point  -GI following.  -surgical and  IR services consulted. Neither thing procedural candidate. GI reaching out to [facility] to see if possible endoscopic intervention warranted (seems unlikely).   Plan NPO Awaiting feedback from GI re: possible [facility] transfer  IR does not feel candidate for perc drain Surgery willing to operate BUT not likely to survive  Day 3 meropenem day 2 vanc  Keep euvolemic  Trend lipase    Ascites Plan [place] abd today   Enterococcus Faecium UTI  Plan Day 2 vanc, may be able to narrow   Acute hypercarbic respiratory failure.  Multifactorial: hypoventilation d/t decreased cAbd from pain, ascites & ileus, worsening atelectasis, small effusions and possible HCAP;  -suspect narcotics also contributing  Plan Wean PEEP/FIO2 PAD protocol RASS goal -2 Repeat CXR and abg in am   Acute encephalopathy -presume primarily d/t hypercarbia but also worried about evolving sepsis Plan Supportive care PAD protocol RASS goal -2  AKI. Had required CRRT back towards the end of January.  -cr bumped; now improved. Bladder pressures <20 Plan Hold lasix  Renal dose meds Am chemistry  Intermittent fluid and electrolyte imbalance Plan Trend chem and replace as indicated   Protein calorie malnutrition  Plan Cont TPN  Anemia  of critical illness hgb < 7 today  Plan Trend cbc Transfuse per protocol   Oral thrush Plan Cont fluconazole   Hyperglycemia Plan ssi   DVT prophylaxis: SCD SUP: PPI  Diet: npo, tpn Activity: BR Disposition : ICU  FAMILY  - Updates:   - Inter-disciplinary family meet or Palliative Care meeting due by:  2/15    Simonne MartinetPeter E Zeferino Mounts ACNP-BC [facility]/Critical [facility] Pager # [phone] OR # [phone] if no answer  11/28/2017, 9:11 AM

## 2017-11-28 NOTE — Progress Notes (Signed)
PHARMACY - ADULT TOTAL PARENTERAL NUTRITION CONSULT NOTE + ANTIBIOTIC NOTE  Pharmacy Consult for TPN + Meropenem/Vancomycin Indication: severe necrotizing pancreatitis, possible HCAP  Patient Measurements: Height: '5\' 10"'  (177.8 cm) Weight: (!) 305 lb 5.4 oz (138.5 kg) IBW/kg (Calculated) : 68.5 TPN AdjBW (KG): 85.3 Body mass index is 43.81 kg/m.  Insulin Requirements:  - 12 units Moderate Novolog SSI / 24hrs - 110 units regular insulin in TPN  Current Nutrition: NPO, TPN at goal  IVF: off 2/4 pm  Central access: R IJ HD catheter TPN start date: 1/24  ASSESSMENT                                                                                                          HPI: 57 y.o. Female with gallstone pancreatitis with necrosis s/p MRCP on 1/15.  Repeat LFTs and lipase normalizing.  No plans for surgical intervention at this time; planning for delayed cholecystectomy per CCS.  Tube feeding was started but had to be stopped on 1/22 due to nausea/vomiting.  Pharmacy is consulted to begin TPN. Patient's course has been complicated with respiratory failure and intubation and subsequent extubation on 11/09/2017.  She was also on CRRT from 1/18-1/23.  Significant events:  1/28: CL diet 1/29: FL diet 1/30: did not tolerate FLD, back to CLD, weight incr 305 > 314  2/2 Remains volume overloaded, but respiratory status is improved.  Avoiding lasix prior to contrast CT early next week.  Tolerating volume and OK to continue to advance towards goal rate per MD. 2/6 repeat CT scan yesterday reveals interval progression of necrotizing pancreatitis.  CXR shows bilateral pleural effusions and airspace disease. With WBC increase, resuming antibiotics with Meropenem for IAI and possible HCAP. PCCM re-consulted and patient re-intubated.  Today: 11/28/2017  Glucose: No hx DM. CBGs elevated since starting TPN but improved with addition of insulin to TPN (CBG 127-139).  Requiring high amount of insulin in TPN  to keep CBGs controlled in setting of pancreatitis.  Electrolytes: Phos 5.0 yesterday, Mag 2.2, K 3.5, CorrCa 10.42 withOUT electrolytes in TPN.  Phos-Calcium product 52.1 (goal <55).  Continue with electrolyte-free TPN bag.  Renal: AKI on CRRT 1/18-1/23. SCr increasing again as patient clinically worsening.  I/O net -173 mL, still with adequate UOP per recordings yesterday, weight decreased today following lasix, stable today.  LFTs: AST/ALT improved to WNL, Alk phos elevated, Tbili low  TGs: 213 (1/15), 332 (1/18), 276 (1/28) 224 (2/4)  Prealbumin: 6.7 (1/25), 9.3 (1/28), 7.1 (2/4)  Antimicrobials this admission: 1/14 Zosyn >> 1/22 1/22 Meropenem >> 1/28, 2/6 >> 2/5 Fluconazole >> 2/7 Vancomycin >>  Dose adjustments this admission: 1/18 adjust zosyn to 3.375g q6h for 30 min infusions with start of CVVHDF 1/23 Meropenem 1g q12h > q8h since CRRT stopped 2/7 change meropenem to 2g q12h for worsening SCr  Microbiology results: 1/14 MRSA PCR: negative 1/15 Surgical PCR: neg/neg 1/22 BCx: ng-final 2/5 UCx: Enterococcus faecium (S- ampicillin, levaquin, vanc; R- NTF) 2/6 strep ur ag: neg 2/6 legionella ur ag: neg 2/6 BCx: ngtd 2/7 Trach  cx: IP (gram stain showing no organisms)   NUTRITIONAL GOALS                                                                                           RD recs 2/7: 1500-1704 kcal, >/= 136 grams protein per day  With updated nutrition goals post re-intubation, will remove the daily lipids for now to continue to meet high protein needs without significantly over feeding calories.  New goal with Clinimix 5/15 at 122m/hr provides 132g/day protein, 1874Kcal/day.   PLAN                                                                                                                         Continue Meropenem to 2g IV q12h.   Obtain random vancomycin level tomorrow ~48 hours following loading dose yesterday to determine when to re-dose.  Trach cx  remains in process - f/u to determine if MRSA coverage for HCAP.  Blood cultures are negative.  If trach returns negative, would recommend switching vancomycin to ampicillin to treat the Enterococcus UTI as safer alternative in worsening renal function and continue meropenem.  Increase Fluconazole to 200 mg IV q24h for IAI dosing coverage in addition to the oropharyngeal Candidiasis.  KCl 10 mEq/1035mx 2. K+ at lower end of normal today.  At 1800 today:  Continue Clinimix 5/15 (NO electrolytes) at goal rate of 110 ml/hr.  Hold lipid emulsion to meet high protein needs without overfeeding kcal in this critically ill obese patient.  TPN to contain standard multivitamins and trace elements  Continue insulin in TPN, increase from 110 units to regular insulin per 24 hr bag.   Continue SSI q4h MODERATE scale  IVF management per MD (currently off).  TPN lab panels on Mondays & Thursdays.  BMET, Phos, Mag in AM.  F/u daily.   AmHershal CoriaPharmD, BCPS Pager: [phone]/05/2018 7:41 AM

## 2017-11-28 NOTE — Progress Notes (Signed)
CRITICAL VALUE ALERT  Critical Value:  hgb 6.7  Date & Time Notied:  11/28/17 0413  Provider Notified: Dr Vassie LollAlva  Orders Received/Actions taken: 1 unit PRBC

## 2017-11-28 NOTE — Progress Notes (Signed)
OT Cancellation Note  Patient Details Name: Emma Washington MRN: [id number] DOB: 07-Mar-1961   Cancelled Treatment:    Reason Eval/Treat Not Completed: Other (comment).  Noted goals are for comfort care.  Will sign off.  Vearl Aitken 11/28/2017, 7:03 AM  Marica OtterMaryellen Kaled Allende, OTR/L [phone] 11/28/2017

## 2017-11-28 NOTE — Progress Notes (Signed)
[facility] Progress Note   Chief Complaint:    Severe pancreatitis  SUBJECTIVE:    she has been itching, better with benadryl.   ASSESSMENT AND PLAN:   1. 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 Surgery following. No evidence for gas / abscesses in imaging. Back on Merrem, Vanco was added as well.  -we spoke with IR again yesterday and Dr. Deanne Coffer was willing to sample fluid collection. At this point Surgery feels risk of infection with perc drainage is high. Recommending eventual cystogastrostomy if pseudocyst don't resolve / patient survives. Her WBC is improving so can hold off of drainage / fluid sampling for now  2. Respiratory failure, on vent.  PCCM doesn't think she has HCAP .   3. UTI, cx positive for enterococcus.  Being treated  4. AKI. Seomt mild improvement in Cr overnight 2.53 >>>2.47.    OBJECTIVE:      Vital signs in last 24 hours: Temp:  [97.8 F (36.6 C)-100 F (37.8 C)] 97.9 F (36.6 C) (02/08 1024) Pulse Rate:  [53-103] 53 (02/08 0900) Resp:  [25-35] 34 (02/08 0900) BP: (80-174)/(26-115) 157/67 (02/08 0900) SpO2:  [93 %-100 %] 97 % (02/08 0900) FiO2 (%):  [50 %] 50 % (02/08 0815) Last BM Date: 11/24/17 General:   Obese white female. Opens eyes, drowzy. Family at bedside Heart:  Regular rate and rhythm, 1-2 BLE edema Pulm: intubated. . Abdomen:  Soft, largely distended, hypoactive bs.    Neuro: opens eyes occasionally, drowsy    Intake/Output from previous day: 02/07 0701 - 02/08 0700 In: 2976.1 [I.V.:2716.1; IV Piggyback:250] Out: 3150 [Urine:1850; Emesis/NG output:650] Intake/Output this shift: Total I/O In: 320 [Blood:320] Out: 425 [Urine:425]  Lab Results: Recent Labs    11/26/17 0456 11/27/17 0310 11/28/17 0315  WBC 25.1* 29.5* 27.1*  HGB 7.7* 7.3* 6.7*  HCT 24.0* 23.2* 20.4*  PLT 477* 485* 466*   BMET Recent Labs    11/26/17 0456 11/27/17 0310 11/28/17 0315  NA 137 136 133*   K 3.7 3.5 3.5  CL 108 108 108  CO2 23 20* 18*  GLUCOSE 164* 135* 132*  BUN 69* 87* 100*  CREATININE 1.69* 2.53* 2.47*  CALCIUM 8.4* 8.5* 8.5*   LFT Recent Labs    11/27/17 0310  PROT 5.5*  ALBUMIN 1.6*  AST 32  ALT 50  ALKPHOS 249*  BILITOT 0.4   PT/INR Recent Labs    11/27/17 0310  LABPROT 15.5*  INR 1.24   Hepatitis Panel No results for input(s): HEPBSAG, HCVAB, HEPAIGM, HEPBIGM in the last 72 hours.  Dg Abd 1 View  Result Date: 11/26/2017 CLINICAL DATA:  Orogastric tube placement EXAM: ABDOMEN - 1 VIEW COMPARISON:  None. FINDINGS: Orogastric tube with the tip projecting over the antrum of the stomach. There is no bowel dilatation to suggest obstruction. There is no evidence of pneumoperitoneum, portal venous gas or pneumatosis. There are no pathologic calcifications along the expected course of the ureters. The osseous structures are unremarkable. IMPRESSION: Orogastric tube with the tip projecting over the antrum of the stomach. Electronically Signed   By: Elige Ko   On: 11/26/2017 13:39   Dg Chest Port 1 View  Result Date: 11/26/2017 CLINICAL DATA:  Status post intubation. EXAM: PORTABLE CHEST 1 VIEW COMPARISON:  Radiograph of same day. FINDINGS: Stable cardiomegaly and central pulmonary vascular congestion. Endotracheal tube is seen projected over tracheal air shadow with distal tip 5 cm above the carina. Nasogastric tube is seen entering the  stomach. Bilateral basilar opacities are noted concerning for atelectasis with associated pleural effusions. No pneumothorax is noted. Bony thorax is unremarkable. IMPRESSION: Endotracheal and nasogastric tubes are in grossly good position. Bibasilar opacities are noted concerning for subsegmental atelectasis with associated pleural effusions. Electronically Signed   By: Lupita RaiderJames  Green Jr, M.D.   On: 11/26/2017 13:46    Principal Problem:   Acute severe pancreatitis Active Problems:   Gallstone pancreatitis   Acute respiratory  failure with hypoxia (HCC)   Hypocalcemia   AKI (acute kidney injury) (HCC)   Anemia   Morbid obesity with body mass index of 40.0-49.9 (HCC)   Polycystic ovarian syndrome   Steatohepatitis, nonalcoholic   Nephrolithiasis   GERD (gastroesophageal reflux disease)   Cholelithiasis   HCAP (healthcare-associated pneumonia)   Volume overload   Acute respiratory failure with hypoxia and hypercapnia (HCC)   Acute metabolic encephalopathy   Oral thrush     LOS: 25 days   Willette ClusterPaula Toniyah Dilmore ,NP 11/28/2017, 10:48 AM  Pager number [phone]

## 2017-11-28 NOTE — Progress Notes (Signed)
eLink Physician-Brief Progress Note Patient Name: Emma Washington DOB: [birth year] MRN: [id number]   Date of Service  11/28/2017  HPI/Events of Note  Hb has drifted to 6.7  eICU Interventions  1 U PRBC     Intervention Category Intermediate Interventions: Bleeding - evaluation and treatment with blood products  Edgel Degnan V. Tajon Moring 11/28/2017, 4:14 AM

## 2017-11-29 DIAGNOSIS — J9602 Acute respiratory failure with hypercapnia: Secondary | ICD-10-CM

## 2017-11-29 LAB — CBC WITH DIFFERENTIAL/PLATELET
Band Neutrophils: 2 %
Basophils Absolute: 0 10*3/uL (ref 0.0–0.1)
Basophils Relative: 0 %
EOS PCT: 6 %
Eosinophils Absolute: 1.7 10*3/uL — ABNORMAL HIGH (ref 0.0–0.7)
HEMATOCRIT: 26.1 % — AB (ref 36.0–46.0)
Hemoglobin: 7.7 g/dL — ABNORMAL LOW (ref 12.0–15.0)
LYMPHS ABS: 1.2 10*3/uL (ref 0.7–4.0)
Lymphocytes Relative: 4 %
MCH: 30.1 pg (ref 26.0–34.0)
MCHC: 29.5 g/dL — ABNORMAL LOW (ref 30.0–36.0)
MCV: 102 fL — AB (ref 78.0–100.0)
METAMYELOCYTES PCT: 6 %
MYELOCYTES: 5 %
Monocytes Absolute: 2 10*3/uL — ABNORMAL HIGH (ref 0.1–1.0)
Monocytes Relative: 7 %
NEUTROS ABS: 24.1 10*3/uL — AB (ref 1.7–7.7)
Neutrophils Relative %: 70 %
Platelets: 488 10*3/uL — ABNORMAL HIGH (ref 150–400)
RBC: 2.56 MIL/uL — AB (ref 3.87–5.11)
RDW: 18 % — AB (ref 11.5–15.5)
WBC: 29 10*3/uL — AB (ref 4.0–10.5)

## 2017-11-29 LAB — BASIC METABOLIC PANEL
ANION GAP: 8 (ref 5–15)
BUN: 112 mg/dL — ABNORMAL HIGH (ref 6–20)
CHLORIDE: 106 mmol/L (ref 101–111)
CO2: 18 mmol/L — AB (ref 22–32)
Calcium: 8.8 mg/dL — ABNORMAL LOW (ref 8.9–10.3)
Creatinine, Ser: 2.33 mg/dL — ABNORMAL HIGH (ref 0.44–1.00)
GFR calc Af Amer: 26 mL/min — ABNORMAL LOW (ref >60)
GFR calc non Af Amer: 22 mL/min — ABNORMAL LOW (ref >60)
Glucose, Bld: 124 mg/dL — ABNORMAL HIGH (ref 65–99)
POTASSIUM: 3.8 mmol/L (ref 3.5–5.1)
Sodium: 132 mmol/L — ABNORMAL LOW (ref 135–145)

## 2017-11-29 LAB — GLUCOSE, CAPILLARY
GLUCOSE-CAPILLARY: 132 mg/dL — AB (ref 65–99)
Glucose-Capillary: 131 mg/dL — ABNORMAL HIGH (ref 65–99)
Glucose-Capillary: 144 mg/dL — ABNORMAL HIGH (ref 65–99)
Glucose-Capillary: 127 mg/dL — ABNORMAL HIGH (ref 65–99)
Glucose-Capillary: 120 mg/dL — ABNORMAL HIGH (ref 65–99)
Glucose-Capillary: 120 mg/dL — ABNORMAL HIGH (ref 65–99)

## 2017-11-29 LAB — VANCOMYCIN, RANDOM: VANCOMYCIN RM: 13

## 2017-11-29 LAB — CULTURE, RESPIRATORY W GRAM STAIN
Culture: NORMAL
Gram Stain: NONE SEEN
Special Requests: NORMAL

## 2017-11-29 LAB — PHOSPHORUS: Phosphorus: 5.6 mg/dL — ABNORMAL HIGH (ref 2.5–4.6)

## 2017-11-29 LAB — MAGNESIUM: MAGNESIUM: 2 mg/dL (ref 1.7–2.4)

## 2017-11-29 LAB — TSH: TSH: 3.072 u[IU]/mL (ref 0.350–4.500)

## 2017-11-29 MED ORDER — ACETAMINOPHEN 10 MG/ML IV SOLN
1000.0000 mg | Freq: Four times a day (QID) | INTRAVENOUS | Status: AC | PRN
Start: 2017-11-29 — End: 2017-11-30
  Filled 2017-11-29: qty 100

## 2017-11-29 MED ORDER — TRACE MINERALS CR-CU-MN-SE-ZN 10-1000-500-60 MCG/ML IV SOLN
INTRAVENOUS | Status: AC
  Administered 2017-11-29: 18:00:00 via INTRAVENOUS
  Filled 2017-11-29: qty 1000

## 2017-11-29 MED ORDER — CLONAZEPAM 1 MG PO TABS
1.0000 mg | ORAL_TABLET | Freq: Two times a day (BID) | ORAL | Status: DC
  Administered 2017-11-29 (×2): 1 mg
  Filled 2017-11-29 (×2): qty 1

## 2017-11-29 MED ORDER — TRACE MINERALS CR-CU-MN-SE-ZN 10-1000-500-60 MCG/ML IV SOLN
INTRAVENOUS | Status: DC
  Filled 2017-11-29: qty 2640

## 2017-11-29 MED ORDER — ACETAMINOPHEN 500 MG PO TABS
1000.0000 mg | ORAL_TABLET | Freq: Four times a day (QID) | ORAL | Status: DC
  Administered 2017-11-29 (×3): 1000 mg
  Filled 2017-11-29 (×3): qty 2

## 2017-11-29 MED ORDER — LORAZEPAM 2 MG/ML IJ SOLN
1.0000 mg | INTRAMUSCULAR | Status: DC | PRN
  Administered 2017-11-29 – 2017-11-30 (×3): 1 mg via INTRAVENOUS
  Filled 2017-11-29 (×4): qty 1

## 2017-11-29 MED ORDER — VANCOMYCIN HCL 10 G IV SOLR
1750.0000 mg | INTRAVENOUS | Status: DC
  Administered 2017-11-29 – 2017-12-01 (×2): 1750 mg via INTRAVENOUS
  Filled 2017-11-29 (×2): qty 1750

## 2017-11-29 NOTE — Progress Notes (Signed)
      Progress Note   Subjective  Patient remains intubated. Had a RBC transfusion for downtrending Hgb. WBC pending. BUN rising. No blood out of OG and no bowel movements per rectum.    Objective   Vital signs in last 24 hours: Temp:  [97.9 F (36.6 C)-99.7 F (37.6 C)] 99.7 F (37.6 C) (02/09 0750) Pulse Rate:  [87-131] 126 (02/09 0900) Resp:  [25-34] 32 (02/09 0900) BP: (91-183)/(23-82) 183/69 (02/09 0900) SpO2:  [88 %-100 %] 93 % (02/09 0900) FiO2 (%):  [50 %] 50 % (02/09 0820) Weight:  [310 lb 13.6 oz (141 kg)] 310 lb 13.6 oz (141 kg) (02/09 0405) Last BM Date: 11/24/17 General:    white female intubated Heart:  tachycardic  Lungs: Respirations even and unlabored on vent Abdomen:  Soft, distended.  Extremities:  (+) LE edema. Neurologic:  sedated Psych:  sedated  Intake/Output from previous day: 02/08 0701 - 02/09 0700 In: 664 [I.V.:144; Blood:320; IV Piggyback:200] Out: 2300 [Urine:2300] Intake/Output this shift: Total I/O In: -  Out: 350 [Urine:350]  Lab Results: Recent Labs    11/27/17 0310 11/28/17 0315 11/28/17 1230 11/29/17 0800  WBC 29.5* 27.1*  --  PENDING  HGB 7.3* 6.7* 8.2* 7.7*  HCT 23.2* 20.4* 25.0* 26.1*  PLT 485* 466*  --  488*   BMET Recent Labs    11/27/17 0310 11/28/17 0315 11/29/17 0500  NA 136 133* 132*  K 3.5 3.5 3.8  CL 108 108 106  CO2 20* 18* 18*  GLUCOSE 135* 132* 124*  BUN 87* 100* 112*  CREATININE 2.53* 2.47* 2.33*  CALCIUM 8.5* 8.5* 8.8*   LFT Recent Labs    11/27/17 0310  PROT 5.5*  ALBUMIN 1.6*  AST 32  ALT 50  ALKPHOS 249*  BILITOT 0.4   PT/INR Recent Labs    11/27/17 0310  LABPROT 15.5*  INR 1.24    Studies/Results: Dg Abd 1 View  Result Date: 11/28/2017 CLINICAL DATA:  Bedside nasogastric tube placement. Current history of necrotizing pancreatitis. EXAM: Portable ABDOMEN-1 VIEW COMPARISON:  11/26/2017, 11/19/2017, 11/13/2017. Unenhanced CT abdomen and pelvis 11/25/2017. FINDINGS: Nasogastric  tube tip in the mid body of the stomach, unchanged since the examination 2 days ago. Visualized bowel gas pattern nonobstructive. IMPRESSION: Nasogastric tube tip in the mid body of the stomach Electronically Signed   By: Hulan Saashomas  Lawrence M.D.   On: 11/28/2017 11:17       Assessment / Plan:   57 y/o female with severe necrotizing pancreatitis with multiple fluid collections. Her WBC had started to downtrend on broad spectrum antibiotics, is pending right now for today. Needed PRBC transfusion, and BUN trending up, but she has no symptoms for GI bleeding. Continue IV PPI and supportive care for her pancreatitis - she is not a good candidate for endoscopic drainage nor surgical intervention right now. I have discussed her case with [facility] and they declined her to transfer there, they have nothing else they can offer. She is critically ill. I spoke this morning with the patient's brother and discussed her course, answered his questions.  We will follow peripherally tomorrow if no new issues. Dr. Leone PayorGessner to assume GI service next week.  Ileene PatrickSteven Armbruster, MD [facility] Pager [phone]

## 2017-11-29 NOTE — Progress Notes (Signed)
Patient ID: Emma Washington, female   DOB: 1960-11-06, 57 y.o.   MRN: [id number] [facility] Progress Note:   * No surgery found *  Subjective: Mental status is sedated on vent Objective: Vital signs in last 24 hours: Temp:  [97.9 F (36.6 C)-99.7 F (37.6 C)] 99.7 F (37.6 C) (02/09 0750) Pulse Rate:  [53-110] 103 (02/09 0800) Resp:  [25-34] 34 (02/09 0800) BP: (91-163)/(23-82) 140/82 (02/09 0800) SpO2:  [88 %-100 %] 97 % (02/09 0800) FiO2 (%):  [50 %] 50 % (02/09 0352) Weight:  [141 kg (310 lb 13.6 oz)] 141 kg (310 lb 13.6 oz) (02/09 0405)  Intake/Output from previous day: 02/08 0701 - 02/09 0700 In: 664 [I.V.:144; Blood:320; IV Piggyback:200] Out: 2300 [Urine:2300] Intake/Output this shift: No intake/output data recorded.  Physical Exam: Work of breathing is vent controlled.  Pain controlled by Fentanyl drip  Lab Results:  Results for orders placed or performed during the hospital encounter of 10/24/2017 (from the past 48 hour(s))  Blood gas, arterial     Status: Abnormal   Collection Time: 11/27/17  9:45 AM  Result Value Ref Range   FIO2 50.00    Delivery systems VENTILATOR    Mode PRESSURE REGULATED VOLUME CONTROL    VT 450 mL   LHR 34 resp/min   Peep/cpap 12.0 cm H20   pH, Arterial 7.223 (L) 7.350 - 7.450   pCO2 arterial 44.7 32.0 - 48.0 mmHg   pO2, Arterial 91.7 83.0 - 108.0 mmHg   Bicarbonate 17.8 (L) 20.0 - 28.0 mmol/L   Acid-base deficit 9.0 (H) 0.0 - 2.0 mmol/L   O2 Saturation 95.9 %   Patient temperature 37.0    Collection site LEFT RADIAL    Drawn by [id number]    Sample type ARTERIAL DRAW    Allens test (pass/fail) PASS PASS    Comment: Performed at [facility], [place] [address], [place], [place] [address]  Glucose, capillary     Status: Abnormal   Collection Time: 11/27/17 11:32 AM  Result Value Ref Range   Glucose-Capillary 150 (H) 65 - 99 mg/dL   Comment 1 Notify RN    Comment 2 Document in Chart   Glucose, capillary      Status: Abnormal   Collection Time: 11/27/17  3:38 PM  Result Value Ref Range   Glucose-Capillary 139 (H) 65 - 99 mg/dL  Glucose, capillary     Status: Abnormal   Collection Time: 11/27/17  7:35 PM  Result Value Ref Range   Glucose-Capillary 127 (H) 65 - 99 mg/dL   Comment 1 Notify RN    Comment 2 Document in Chart   Glucose, capillary     Status: Abnormal   Collection Time: 11/27/17 11:22 PM  Result Value Ref Range   Glucose-Capillary 136 (H) 65 - 99 mg/dL   Comment 1 Notify RN   Magnesium     Status: None   Collection Time: 11/28/17  3:15 AM  Result Value Ref Range   Magnesium 2.2 1.7 - 2.4 mg/dL    Comment: Performed at [facility], [place] [address] [place], [place] [address]  Basic metabolic panel     Status: Abnormal   Collection Time: 11/28/17  3:15 AM  Result Value Ref Range   Sodium 133 (L) 135 - 145 mmol/L   Potassium 3.5 3.5 - 5.1 mmol/L   Chloride 108 101 - 111 mmol/L   CO2 18 (L) 22 - 32 mmol/L   Glucose, Bld 132 (H)  65 - 99 mg/dL   BUN 100 (H) 6 - 20 mg/dL    Comment: RESULTS CONFIRMED BY MANUAL DILUTION   Creatinine, Ser 2.47 (H) 0.44 - 1.00 mg/dL   Calcium 8.5 (L) 8.9 - 10.3 mg/dL   GFR calc non Af Amer 21 (L) >60 mL/min   GFR calc Af Amer 24 (L) >60 mL/min    Comment: (NOTE) The eGFR has been calculated using the CKD EPI equation. This calculation has not been validated in all clinical situations. eGFR's persistently <60 mL/min signify possible Chronic Kidney Disease.    Anion gap 7 5 - 15    Comment: Performed at [facility], [place] [address], [place], [place] [address]  CBC     Status: Abnormal   Collection Time: 11/28/17  3:15 AM  Result Value Ref Range   WBC 27.1 (H) 4.0 - 10.5 K/uL   RBC 2.23 (L) 3.87 - 5.11 MIL/uL   Hemoglobin 6.7 (LL) 12.0 - 15.0 g/dL    Comment: CRITICAL RESULT CALLED TO, READ BACK BY AND VERIFIED WITH: D GODFREY,RN '@[id number]'  11/28/17 MKELLY    HCT 20.4 (L) 36.0 - 46.0 %   MCV 91.5 78.0 -  100.0 fL   MCH 30.0 26.0 - 34.0 pg   MCHC 32.8 30.0 - 36.0 g/dL   RDW 16.4 (H) 11.5 - 15.5 %   Platelets 466 (H) 150 - 400 K/uL    Comment: Performed at [facility], [place] [address], [place], [place] [address]  Glucose, capillary     Status: Abnormal   Collection Time: 11/28/17  3:33 AM  Result Value Ref Range   Glucose-Capillary 132 (H) 65 - 99 mg/dL   Comment 1 Notify RN   Prepare RBC     Status: None   Collection Time: 11/28/17  4:42 AM  Result Value Ref Range   Order Confirmation      ORDER PROCESSED BY BLOOD BANK Performed at [facility], [place] [address] [place], [place] [address]   Type and screen Victor     Status: None (Preliminary result)   Collection Time: 11/28/17  4:42 AM  Result Value Ref Range   ABO/RH(D) A NEG    Antibody Screen NEG    Sample Expiration 12/01/2017    Unit Number [id number]    Blood Component Type RED CELLS,LR    Unit division 00    Status of Unit ISSUED    Transfusion Status OK TO TRANSFUSE    Crossmatch Result      Compatible Performed at [facility], [place] [address], [place], [place] [address]   ABO/Rh     Status: None   Collection Time: 11/28/17  4:42 AM  Result Value Ref Range   ABO/RH(D)      A NEG Performed at [place] [address] [place], [place] [address]   Glucose, capillary     Status: Abnormal   Collection Time: 11/28/17  7:50 AM  Result Value Ref Range   Glucose-Capillary 139 (H) 65 - 99 mg/dL  Glucose, capillary     Status: Abnormal   Collection Time: 11/28/17 11:33 AM  Result Value Ref Range   Glucose-Capillary 136 (H) 65 - 99 mg/dL  Hemoglobin and hematocrit, blood     Status: Abnormal   Collection Time: 11/28/17 12:30 PM  Result Value Ref Range   Hemoglobin 8.2 (L) 12.0 - 15.0 g/dL   HCT 25.0 (L) 36.0 - 46.0 %  Comment: Performed at [facility], [place] [address] [place], [place]  [address]  Glucose, capillary     Status: Abnormal   Collection Time: 11/28/17  3:38 PM  Result Value Ref Range   Glucose-Capillary 132 (H) 65 - 99 mg/dL   Comment 1 Notify RN    Comment 2 Document in Chart   Glucose, capillary     Status: Abnormal   Collection Time: 11/28/17  7:39 PM  Result Value Ref Range   Glucose-Capillary 127 (H) 65 - 99 mg/dL   Comment 1 Notify RN   Glucose, capillary     Status: Abnormal   Collection Time: 11/28/17 11:18 PM  Result Value Ref Range   Glucose-Capillary 156 (H) 65 - 99 mg/dL   Comment 1 Notify RN   Glucose, capillary     Status: Abnormal   Collection Time: 11/29/17  3:38 AM  Result Value Ref Range   Glucose-Capillary 132 (H) 65 - 99 mg/dL   Comment 1 Notify RN   Basic metabolic panel     Status: Abnormal   Collection Time: 11/29/17  5:00 AM  Result Value Ref Range   Sodium 132 (L) 135 - 145 mmol/L   Potassium 3.8 3.5 - 5.1 mmol/L   Chloride 106 101 - 111 mmol/L   CO2 18 (L) 22 - 32 mmol/L   Glucose, Bld 124 (H) 65 - 99 mg/dL   BUN 112 (H) 6 - 20 mg/dL    Comment: RESULTS CONFIRMED BY MANUAL DILUTION   Creatinine, Ser 2.33 (H) 0.44 - 1.00 mg/dL   Calcium 8.8 (L) 8.9 - 10.3 mg/dL   GFR calc non Af Amer 22 (L) >60 mL/min   GFR calc Af Amer 26 (L) >60 mL/min    Comment: (NOTE) The eGFR has been calculated using the CKD EPI equation. This calculation has not been validated in all clinical situations. eGFR's persistently <60 mL/min signify possible Chronic Kidney Disease.    Anion gap 8 5 - 15    Comment: Performed at [facility], [place] [address], [place], [place] [address]  Phosphorus     Status: Abnormal   Collection Time: 11/29/17  5:00 AM  Result Value Ref Range   Phosphorus 5.6 (H) 2.5 - 4.6 mg/dL    Comment: Performed at [facility], [place] [address], [place], [place] [address]  Magnesium     Status: None   Collection Time: 11/29/17  5:00 AM  Result Value Ref Range   Magnesium 2.0 1.7 - 2.4  mg/dL    Comment: Performed at [facility], [place] [address], [place], [place] [address]  Glucose, capillary     Status: Abnormal   Collection Time: 11/29/17  7:48 AM  Result Value Ref Range   Glucose-Capillary 131 (H) 65 - 99 mg/dL   Comment 1 Notify RN    Comment 2 Document in Chart     Radiology/Results: Dg Abd 1 View  Result Date: 11/28/2017 CLINICAL DATA:  Bedside nasogastric tube placement. Current history of necrotizing pancreatitis. EXAM: Portable ABDOMEN-1 VIEW COMPARISON:  11/26/2017, 11/19/2017, 11/13/2017. Unenhanced CT abdomen and pelvis 11/25/2017. FINDINGS: Nasogastric tube tip in the mid body of the stomach, unchanged since the examination 2 days ago. Visualized bowel gas pattern nonobstructive. IMPRESSION: Nasogastric tube tip in the mid body of the stomach Electronically Signed   By: Evangeline Dakin M.D.   On: 11/28/2017 11:17    Anti-infectives: Anti-infectives (From admission, onward)   Start  Dose/Rate Route Frequency Ordered Stop   11/28/17 1800  fluconazole (DIFLUCAN) IVPB 200 mg     200 mg 100 mL/hr over 60 Minutes Intravenous Every 24 hours 11/28/17 0740     11/27/17 1200  meropenem (MERREM) 2 g in sodium chloride 0.9 % 100 mL IVPB     2 g 200 mL/hr over 30 Minutes Intravenous Every 12 hours 11/27/17 0925     11/27/17 1100  vancomycin (VANCOCIN) 2,500 mg in sodium chloride 0.9 % 500 mL IVPB     2,500 mg 250 mL/hr over 120 Minutes Intravenous  Once 11/27/17 1006 11/27/17 1325   11/26/17 1800  fluconazole (DIFLUCAN) IVPB 100 mg  Status:  Discontinued     100 mg 50 mL/hr over 60 Minutes Intravenous Every 24 hours 11/25/17 1708 11/28/17 0740   11/26/17 1200  meropenem (MERREM) 1 g in sodium chloride 0.9 % 100 mL IVPB  Status:  Discontinued     1 g 200 mL/hr over 30 Minutes Intravenous Every 8 hours 11/26/17 0956 11/27/17 0925   11/25/17 1800  fluconazole (DIFLUCAN) IVPB 200 mg     200 mg 100 mL/hr over 60 Minutes Intravenous  Once 11/25/17  1708 11/25/17 1911   11/12/17 1800  meropenem (MERREM) 1 g in sodium chloride 0.9 % 100 mL IVPB  Status:  Discontinued     1 g 200 mL/hr over 30 Minutes Intravenous Every 8 hours 11/12/17 1115 11/17/17 0954   11/11/17 1000  meropenem (MERREM) 1 g in sodium chloride 0.9 % 100 mL IVPB  Status:  Discontinued     1 g 200 mL/hr over 30 Minutes Intravenous Every 12 hours 11/11/17 0911 11/12/17 1115   11/09/17 1000  piperacillin-tazobactam (ZOSYN) IVPB 3.375 g  Status:  Discontinued     3.375 g 12.5 mL/hr over 240 Minutes Intravenous Every 8 hours 11/09/17 0614 11/09/17 0615   11/09/17 1000  piperacillin-tazobactam (ZOSYN) IVPB 3.375 g  Status:  Discontinued     3.375 g 100 mL/hr over 30 Minutes Intravenous Every 6 hours 11/09/17 0616 11/11/17 0911   11/07/17 2200  piperacillin-tazobactam (ZOSYN) IVPB 2.25 g  Status:  Discontinued     2.25 g 100 mL/hr over 30 Minutes Intravenous Every 8 hours 11/07/17 1218 11/07/17 1741   11/07/17 2200  piperacillin-tazobactam (ZOSYN) IVPB 3.375 g  Status:  Discontinued     3.375 g 12.5 mL/hr over 240 Minutes Intravenous Every 6 hours 11/07/17 1741 11/07/17 1747   11/07/17 2200  piperacillin-tazobactam (ZOSYN) IVPB 3.375 g  Status:  Discontinued     3.375 g 100 mL/hr over 30 Minutes Intravenous Every 6 hours 11/07/17 1747 11/09/17 0613   11/04/17 1100  piperacillin-tazobactam (ZOSYN) IVPB 3.375 g  Status:  Discontinued     3.375 g 12.5 mL/hr over 240 Minutes Intravenous Every 8 hours 11/04/17 1059 11/07/17 1218   11/14/2017 1200  piperacillin-tazobactam (ZOSYN) IVPB 3.375 g     3.375 g 100 mL/hr over 30 Minutes Intravenous  Once 11/06/2017 1157 11/20/2017 1309      Assessment/Plan: Problem List: Patient Active Problem List   Diagnosis Date Noted  . HCAP (healthcare-associated pneumonia) 11/26/2017  . Volume overload 11/26/2017  . Acute respiratory failure with hypoxia and hypercapnia ([place]) 11/26/2017  . Acute metabolic encephalopathy 28/41/3244  . Oral  thrush 11/26/2017  . Necrotizing pancreatitis 11/24/2017  . Steatohepatitis, nonalcoholic 10/23/7251  . Nephrolithiasis 11/24/2017  . GERD (gastroesophageal reflux disease) 11/24/2017  . Cholelithiasis 11/24/2017  . Anemia 11/12/2017  . Morbid obesity with body mass  index of 40.0-49.9 ([place]) 11/12/2017  . AKI (acute kidney injury) ([place])   . Hypocalcemia 11/05/2017  . Acute respiratory failure with hypoxia ([place])   . Gallstone pancreatitis 11/11/2017  . 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 Polycystic ovarian syndrome 06/17/2013  . Vitamin D deficiency 06/17/2013    Necrotizing pancreatitis-medical treatment in progress * No surgery found *    LOS: 26 days   Matt B. Hassell Done, MD, [facility] [phone]  11/29/2017 8:58 AM

## 2017-11-29 NOTE — Progress Notes (Signed)
PULMONARY / CRITICAL CARE MEDICINE   Name: Emma MantisJoyce Washington MRN: [id number] DOB: [birth year]    ADMISSION DATE:  10/24/2017 CONSULTATION DATE:  11/26/2017 REFERRING MD:  Dr. Janee Mornhompson, [facility]  CHIEF COMPLAINT:  Progressive respiratory failure   HISTORY OF PRESENT ILLNESS:   57 yo female admitted 10/29/2017 with gallstone pancreatitis and necrotizing pancreatitis.  Hospital stay complicated by respiratory failure from ALI, AKI.  She was extubated 1/20.  Since then she developed recurrent hypoxic/hypercapnic respiratory failure requiring reintubation associated with progression of pancreatitis.  PMHx of HTN, GERD, Anemia, Nephrolithiasis, PCOS.  SUBJECTIVE:  Remains on full vent support with increased PEEP.  VITAL SIGNS: BP (!) 183/69   Pulse (!) 126   Temp 99.7 F (37.6 C) (Axillary)   Resp (!) 32   Ht 5\' 10"  (1.778 m)   Wt (!) 310 lb 13.6 oz (141 kg)   LMP 11/07/2011   SpO2 93%   BMI 44.60 kg/m   VENTILATOR SETTINGS: Vent Mode: PRVC FiO2 (%):  [50 %] 50 % Set Rate:  [34 bmp] 34 bmp Vt Set:  [450 mL] 450 mL PEEP:  [8 cmH20] 8 cmH20 Plateau Pressure:  [24 cmH20-27 cmH20] 27 cmH20  INTAKE / OUTPUT: I/O last 3 completed shifts: In: 2128.7 [I.V.:1448.7; Blood:320; Other:10; IV Piggyback:350] Out: 3750 [Urine:3300; Emesis/NG output:450]  PHYSICAL EXAMINATION:  General - ill appearing Eyes - pupils reactive ENT - NG tube and ETT in place, prominent facial hair Cardiac - regular, tachycardic no murmur Chest - decreased BS, b/l rhonchi Abd - soft, mid epigastric tenderness Ext - 2+ edema Skin - no rashes Neuro - not following commands  LABS:  BMET Recent Labs  Lab 11/27/17 0310 11/28/17 0315 11/29/17 0500  NA 136 133* 132*  K 3.5 3.5 3.8  CL 108 108 106  CO2 20* 18* 18*  BUN 87* 100* 112*  CREATININE 2.53* 2.47* 2.33*  GLUCOSE 135* 132* 124*    Electrolytes Recent Labs  Lab 11/25/17 0500  11/27/17 0310 11/28/17 0315 11/29/17 0500  CALCIUM 8.2*   < > 8.5* 8.5*  8.8*  MG 2.0  --  1.7 2.2 2.0  PHOS 4.1  --  5.0*  --  5.6*   < > = values in this interval not displayed.    CBC Recent Labs  Lab 11/27/17 0310 11/28/17 0315 11/28/17 1230 11/29/17 0800  WBC 29.5* 27.1*  --  29.0*  HGB 7.3* 6.7* 8.2* 7.7*  HCT 23.2* 20.4* 25.0* 26.1*  PLT 485* 466*  --  488*    Coag's Recent Labs  Lab 11/27/17 0310  INR 1.24    Sepsis Markers Recent Labs  Lab 11/26/17 1332  LATICACIDVEN 0.8    ABG Recent Labs  Lab 11/26/17 0800 11/26/17 1301 11/27/17 0945  PHART 7.246* 7.196* 7.223*  PCO2ART 48.7* 55.3* 44.7  PO2ART 98.2 68.4* 91.7    Liver Enzymes Recent Labs  Lab 11/24/17 0446 11/26/17 0456 11/27/17 0310  AST 53* 39 32  ALT 66* 64* 50  ALKPHOS 283* 273* 249*  BILITOT 0.3 0.1* 0.4  ALBUMIN 1.6* 1.6* 1.6*    Cardiac Enzymes No results for input(s): TROPONINI, PROBNP in the last 168 hours.  Glucose Recent Labs  Lab 11/28/17 1133 11/28/17 1538 11/28/17 1939 11/28/17 2318 11/29/17 0338 11/29/17 0748  GLUCAP 136* 132* 127* 156* 132* 131*    Imaging Dg Abd 1 View  Result Date: 11/28/2017 CLINICAL DATA:  Bedside nasogastric tube placement. Current history of necrotizing pancreatitis. EXAM: Portable ABDOMEN-1 VIEW COMPARISON:  11/26/2017, 11/19/2017,  11/13/2017. Unenhanced CT abdomen and pelvis 11/25/2017. FINDINGS: Nasogastric tube tip in the mid body of the stomach, unchanged since the examination 2 days ago. Visualized bowel gas pattern nonobstructive. IMPRESSION: Nasogastric tube tip in the mid body of the stomach Electronically Signed   By: Hulan Saas M.D.   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 - appreciate help from GI and CCS - case d/w [facility] on 2/07 >> not a candidate for endoscopic drainage - not a surgical candidate - continue Abx  Acute hypoxic/hypercapnic respiratory failure. - full vent support  Enterococcus UTI. Thrush. - continue Abx - continue diflucan for thrush  Acute metabolic encephalopathy. - monitor mental status  Acute renal failure. - monitor urine outpt, renal fx  Anemia of critical illness. - no evidence for bleeding - f/u CBC  Hyperglycemia. - SSI  DVT prophylaxis - SQ heparin SUP - protonix Nutrition - TPN Goals of care - Full code.  Palliative care consulted.  If not further improvement, then might consider transition to comfort measures next week.  CC time 32 minutes  Coralyn Helling, MD [facility] Pulmonary/Critical Care 11/29/2017, 10:54 AM Pager:  [phone] After 3pm call: [phone]

## 2017-11-29 NOTE — Progress Notes (Signed)
PHARMACY - ADULT TOTAL PARENTERAL NUTRITION CONSULT NOTE + ANTIBIOTIC NOTE  Pharmacy Consult for TPN + Meropenem/Vancomycin Indication: severe necrotizing pancreatitis, possible HCAP  Patient Measurements: Height: '5\' 10"'  (177.8 cm) Weight: (!) 310 lb 13.6 oz (141 kg) IBW/kg (Calculated) : 68.5 TPN AdjBW (KG): 85.3 Body mass index is 44.6 kg/m.  Insulin Requirements:  - 13 units Moderate Novolog SSI / 24hrs - 110 units regular insulin in TPN  Current Nutrition: NPO, TPN at goal  IVF: off 2/4 pm  Central access: R IJ HD catheter TPN start date: 1/24  ASSESSMENT                                                                                                          HPI: 57 y.o. Female with gallstone pancreatitis with necrosis s/p MRCP on 1/15.  Repeat LFTs and lipase normalizing.  No plans for surgical intervention at this time; planning for delayed cholecystectomy per CCS.  Tube feeding was started but had to be stopped on 1/22 due to nausea/vomiting.  Pharmacy is consulted to begin TPN. Patient's course has been complicated with respiratory failure and intubation and subsequent extubation on 11/09/2017.  She was also on CRRT from 1/18-1/23.  Significant events:  1/28: CL diet 1/29: FL diet 1/30: did not tolerate FLD, back to CLD, weight incr 305 > 314  2/2 Remains volume overloaded, but respiratory status is improved.  Avoiding lasix prior to contrast CT early next week.  Tolerating volume and OK to continue to advance towards goal rate per MD. 2/6 repeat CT scan yesterday reveals interval progression of necrotizing pancreatitis.  CXR shows bilateral pleural effusions and airspace disease. With WBC increase, resuming antibiotics with Meropenem for IAI and possible HCAP. PCCM re-consulted and patient re-intubated.  Today: 11/29/2017  Glucose: No hx DM. CBGs elevated since starting TPN but improved with addition of insulin to TPN (CBG 127-156).  Requiring high amount of insulin in TPN  to keep CBGs controlled in setting of pancreatitis.  Electrolytes: Phos 5.6 , Mag 2.0, K 3.8, CorrCa 10.72 withOUT electrolytes in TPN.  Phos-Calcium product 52.1 (goal <55).  Continue with electrolyte-free TPN bag.  Renal: AKI on CRRT 1/18-1/23. SCr down slightly today.  I/O net -1636 mL, still with adequate UOP per recordings yesterday, weight decreased today following lasix, stable today.  LFTs: AST/ALT improved to WNL, Alk phos elevated, Tbili low  TGs: 213 (1/15), 332 (1/18), 276 (1/28) 224 (2/4)  Prealbumin: 6.7 (1/25), 9.3 (1/28), 7.1 (2/4)  Antimicrobials this admission: 1/14 Zosyn >> 1/22 1/22 Meropenem >> 1/28, 2/6 >> 2/5 Fluconazole >> 2/7 Vancomycin >>  Dose adjustments this admission: 1/18 adjust zosyn to 3.375g q6h for 30 min infusions with start of CVVHDF 1/23 Meropenem 1g q12h > q8h since CRRT stopped 2/7 change meropenem to 2g q12h for worsening SCr  Microbiology results: 1/14 MRSA PCR: negative 1/15 Surgical PCR: neg/neg 1/22 BCx: ng-final 2/5 UCx: Enterococcus faecium (S- ampicillin, levaquin, vanc; R- NTF) 2/6 strep ur ag: neg 2/6 legionella ur ag: neg 2/6 BCx: ngtd 2/7 Trach cx: IP (gram  stain showing no organisms)   NUTRITIONAL GOALS                                                                                           RD recs 2/7: 1500-1704 kcal, >/= 136 grams protein per day  With updated nutrition goals post re-intubation, will remove the daily lipids for now to continue to meet high protein needs without significantly over feeding calories.  New goal with Clinimix 5/15 at 179m/hr provides 132g/day protein, 1874Kcal/day.   PLAN                                                                                                                         Continue Meropenem to 2g IV q12h.   RV= 13, redose vanc at 17519mIV q48h  Trach cx remains in process - f/u to determine if MRSA coverage for HCAP.  Blood cultures are negative.  If trach returns  negative, would recommend switching vancomycin to ampicillin to treat the Enterococcus UTI as safer alternative in worsening renal function and continue meropenem.  Continue Fluconazole to 200 mg IV q24h for IAI dosing coverage in addition to the oropharyngeal Candidiasis.    At 1800 today:  Continue Clinimix 5/15 (NO electrolytes) at goal rate of 110 ml/hr.  Hold lipid emulsion to meet high protein needs without overfeeding kcal in this critically ill obese patient.  TPN to contain standard multivitamins and trace elements  Continue insulin in TPN, increase from 110 units to regular insulin per 24 hr bag.   Continue SSI q4h MODERATE scale  IVF management per MD (currently off).  TPN lab panels on Mondays & Thursdays.   F/u daily.   ElDolly RiasPh 11/29/2017, 8:54 AM Pager [phone]

## 2017-11-29 NOTE — Progress Notes (Signed)
RT tried to wen the Pt and she was very agitated and her SATS dropped

## 2017-11-30 ENCOUNTER — Inpatient Hospital Stay (HOSPITAL_COMMUNITY): Payer: BLUE CROSS/BLUE SHIELD

## 2017-11-30 LAB — GLUCOSE, CAPILLARY
GLUCOSE-CAPILLARY: 112 mg/dL — AB (ref 65–99)
GLUCOSE-CAPILLARY: 128 mg/dL — AB (ref 65–99)
GLUCOSE-CAPILLARY: 151 mg/dL — AB (ref 65–99)
GLUCOSE-CAPILLARY: 128 mg/dL — AB (ref 65–99)
Glucose-Capillary: 150 mg/dL — ABNORMAL HIGH (ref 65–99)
Glucose-Capillary: 135 mg/dL — ABNORMAL HIGH (ref 65–99)

## 2017-11-30 LAB — BPAM RBC
BLOOD PRODUCT EXPIRATION DATE: 201902242359
ISSUE DATE / TIME: 201902080800
Unit Type and Rh: 600

## 2017-11-30 LAB — CBC
HEMATOCRIT: 22.7 % — AB (ref 36.0–46.0)
HEMOGLOBIN: 7.4 g/dL — AB (ref 12.0–15.0)
MCH: 30 pg (ref 26.0–34.0)
MCHC: 32.6 g/dL (ref 30.0–36.0)
MCV: 91.9 fL (ref 78.0–100.0)
Platelets: 527 10*3/uL — ABNORMAL HIGH (ref 150–400)
RBC: 2.47 MIL/uL — AB (ref 3.87–5.11)
RDW: 16.4 % — ABNORMAL HIGH (ref 11.5–15.5)
WBC: 30.1 10*3/uL — ABNORMAL HIGH (ref 4.0–10.5)

## 2017-11-30 LAB — BASIC METABOLIC PANEL
Anion gap: 7 (ref 5–15)
BUN: 117 mg/dL — AB (ref 6–20)
CHLORIDE: 108 mmol/L (ref 101–111)
CO2: 17 mmol/L — ABNORMAL LOW (ref 22–32)
CREATININE: 2.22 mg/dL — AB (ref 0.44–1.00)
Calcium: 8.9 mg/dL (ref 8.9–10.3)
GFR calc Af Amer: 28 mL/min — ABNORMAL LOW (ref >60)
GFR calc non Af Amer: 24 mL/min — ABNORMAL LOW (ref >60)
GLUCOSE: 127 mg/dL — AB (ref 65–99)
POTASSIUM: 3.6 mmol/L (ref 3.5–5.1)
Sodium: 132 mmol/L — ABNORMAL LOW (ref 135–145)

## 2017-11-30 LAB — TYPE AND SCREEN
ABO/RH(D): A NEG
Antibody Screen: NEGATIVE
Unit division: 0

## 2017-11-30 MED ORDER — TRACE MINERALS CR-CU-MN-SE-ZN 10-1000-500-60 MCG/ML IV SOLN
INTRAVENOUS | Status: AC
  Administered 2017-11-30: 18:00:00 via INTRAVENOUS
  Filled 2017-11-30: qty 2000

## 2017-11-30 MED ORDER — FENTANYL CITRATE (PF) 2500 MCG/50ML IJ SOLN
25.0000 ug/h | INTRAMUSCULAR | Status: DC
  Administered 2017-11-30: 100 ug/h via INTRAVENOUS
  Administered 2017-12-01: 150 ug/h via INTRAVENOUS
  Administered 2017-12-02: 200 ug/h via INTRAVENOUS
  Administered 2017-12-02: 150 ug/h via INTRAVENOUS
  Administered 2017-12-03: 200 ug/h via INTRAVENOUS
  Filled 2017-11-30 (×7): qty 50

## 2017-11-30 MED ORDER — DIPHENHYDRAMINE HCL 50 MG/ML IJ SOLN
12.5000 mg | Freq: Four times a day (QID) | INTRAMUSCULAR | Status: DC | PRN
Start: 2017-11-30 — End: 2017-12-01

## 2017-11-30 NOTE — Progress Notes (Signed)
Patient ID: Emma Washington, female   DOB: [birth year], 57 y.o.   MRN: [id number] Nothing surgical to do as of now. Will continue to follow

## 2017-11-30 NOTE — Progress Notes (Signed)
PHARMACY - ADULT TOTAL PARENTERAL NUTRITION CONSULT NOTE + ANTIBIOTIC NOTE  Pharmacy Consult for TPN + Meropenem/Vancomycin Indication: severe necrotizing pancreatitis, possible HCAP  Patient Measurements: Height: _0  (177.8 cm) Weight: (!) 312 lb 9.8 oz (141.8 kg) IBW/kg (Calculated) : 68.5 TPN AdjBW (KG): 85.3 Body mass index is 44.86 kg/m.  Insulin Requirements:  - 6 units Moderate Novolog SSI / 24hrs - 110 units regular insulin in TPN  Current Nutrition: NPO, TPN at goal  IVF: off 2/4 pm  Central access: R IJ HD catheter TPN start date: 1/24  ASSESSMENT                                                                                                          HPI: 57 y.o. Female with gallstone pancreatitis with necrosis s/p MRCP on 1/15.  Repeat LFTs and lipase normalizing.  No plans for surgical intervention at this time; planning for delayed cholecystectomy per CCS.  Tube feeding was started but had to be stopped on 1/22 due to nausea/vomiting.  Pharmacy is consulted to begin TPN. Patient's course has been complicated with respiratory failure and intubation and subsequent extubation on 11/09/2017.  She was also on CRRT from 1/18-1/23.  Significant events:  1/28: CL diet 1/29: FL diet 1/30: did not tolerate FLD, back to CLD, weight incr 305 > 314  2/2 Remains volume overloaded, but respiratory status is improved.  Avoiding lasix prior to contrast CT early next week.  Tolerating volume and OK to continue to advance towards goal rate per MD. 2/6 repeat CT scan yesterday reveals interval progression of necrotizing pancreatitis.  CXR shows bilateral pleural effusions and airspace disease. With WBC increase, resuming antibiotics with Meropenem for IAI and possible HCAP. PCCM re-consulted and patient re-intubated.  Today: 11/30/2017  Glucose: No hx DM. CBGs elevated since starting TPN but improved with addition of insulin to TPN (CBG 112-144).  Requiring high amount of insulin in TPN  to keep CBGs controlled in setting of pancreatitis.  Electrolytes:  K 3.6, CorrCa 10.82 withOUT electrolytes in TPN.  Phos-Calcium product 52.1 (goal <55).  Continue with electrolyte-free TPN bag.  Renal: AKI on CRRT 1/18-1/23. SCr down slightly today.  I/O net +442.9 mL, still with adequate UOP per recordings yesterday, weight increased today.  LFTs: AST/ALT improved to WNL, Alk phos elevated, Tbili low  TGs: 213 (1/15), 332 (1/18), 276 (1/28) 224 (2/4)  Prealbumin: 6.7 (1/25), 9.3 (1/28), 7.1 (2/4)  Antimicrobials this admission: 1/14 Zosyn >> 1/22 1/22 Meropenem >> 1/28, 2/6 >> 2/5 Fluconazole >> (2/10) 2/7 Vancomycin >>  Dose adjustments this admission: 1/18 adjust zosyn to 3.375g q6h for 30 min infusions with start of CVVHDF 1/23 Meropenem 1g q12h > q8h since CRRT stopped 2/7 change meropenem to 2g q12h for worsening SCr  Microbiology results: 1/14 MRSA PCR: negative 1/15 Surgical PCR: neg/neg 1/22 BCx: ng-final 2/5 UCx: Enterococcus faecium (S- ampicillin, levaquin, vanc; R- NTF) 2/6 strep ur ag: neg 2/6 legionella ur ag: neg 2/6 BCx: ngtd 2/7 Trach cx: IP (gram stain showing no organisms)   NUTRITIONAL  GOALS                                                                                           RD recs 2/7: 1500-1704 kcal, >/= 136 grams protein per day  With updated nutrition goals post re-intubation, will remove the daily lipids for now to continue to meet high protein needs without significantly over feeding calories.  New goal with Clinimix 5/15 at 134m/hr provides 132g/day protein, 1874Kcal/day.   PLAN                                                                                                                         Continue Meropenem to 2g IV q12h.   RV= 13, redose vanc at 17550mIV q48h  Trach cx remains in process - f/u to determine if MRSA coverage for HCAP.  Blood cultures are negative.  If trach returns negative, would recommend switching vancomycin  to ampicillin to treat the Enterococcus UTI as safer alternative in worsening renal function and continue meropenem.  Discontinue Fluconazole to 200 mg IV q24h after todays dose per MD   At 1800 today:  Continue Clinimix 5/15 (NO electrolytes) at goal rate of 110 ml/hr.  Hold lipid emulsion to meet high protein needs without overfeeding kcal in this critically ill obese patient.  TPN to contain standard multivitamins and trace elements  Continue insulin in TPN, increase from 110 units to regular insulin per 24 hr bag.   Continue SSI q4h MODERATE scale  IVF management per MD (currently off).  TPN lab panels on Mondays & Thursdays.   F/u daily.   ElDolly RiasPh 11/30/2017, 8:23 AM Pager [phone]

## 2017-11-30 NOTE — Progress Notes (Signed)
PULMONARY / CRITICAL CARE MEDICINE   Name: Emma Washington MRN: [id number] DOB: [birth year]    ADMISSION DATE:  12/21/2017 CONSULTATION DATE:  11/26/2017 REFERRING MD:  Dr. Janee Mornhompson, [facility]  CHIEF COMPLAINT:  Progressive respiratory failure   HISTORY OF PRESENT ILLNESS:   57 yo female admitted April 30, 2018 with gallstone pancreatitis and necrotizing pancreatitis.  Hospital stay complicated by respiratory failure from ALI, AKI.  She was extubated 1/20.  Since then she developed recurrent hypoxic/hypercapnic respiratory failure requiring reintubation associated with progression of pancreatitis.  PMHx of HTN, GERD, Anemia, Nephrolithiasis, PCOS.  SUBJECTIVE:  O2 needs improved some.  Improved urine outpt.  VITAL SIGNS: BP (!) 93/44   Pulse 80   Temp 100.2 F (37.9 C) (Axillary)   Resp (!) 34   Ht 5\' 10"  (1.778 m)   Wt (!) 312 lb 9.8 oz (141.8 kg)   LMP 11/07/2011   SpO2 96%   BMI 44.86 kg/m   VENTILATOR SETTINGS: Vent Mode: PRVC FiO2 (%):  [45 %-50 %] 45 % Set Rate:  [34 bmp] 34 bmp Vt Set:  [450 mL] 450 mL PEEP:  [8 cmH20] 8 cmH20 Plateau Pressure:  [16 cmH20-28 cmH20] 26 cmH20  INTAKE / OUTPUT: I/O last 3 completed shifts: In: 2532.9 [I.V.:1572.9; NG/GT:60; IV Piggyback:900] Out: 3275 [Urine:3275]  PHYSICAL EXAMINATION:  General - sedated Eyes - pupils reactive ENT - ETT/OG in place Cardiac - regular, no murmur Chest - decreased BS Abd - distended, increased tympany Ext - 2+ eedema Skin - no rashes Neuro - opens eyes with stimulation  LABS:  BMET Recent Labs  Lab 11/28/17 0315 11/29/17 0500 11/30/17 0435  NA 133* 132* 132*  K 3.5 3.8 3.6  CL 108 106 108  CO2 18* 18* 17*  BUN 100* 112* 117*  CREATININE 2.47* 2.33* 2.22*  GLUCOSE 132* 124* 127*    Electrolytes Recent Labs  Lab 11/25/17 0500  11/27/17 0310 11/28/17 0315 11/29/17 0500 11/30/17 0435  CALCIUM 8.2*   < > 8.5* 8.5* 8.8* 8.9  MG 2.0  --  1.7 2.2 2.0  --   PHOS 4.1  --  5.0*  --  5.6*  --    < > = values in this interval not displayed.    CBC Recent Labs  Lab 11/28/17 0315 11/28/17 1230 11/29/17 0800 11/30/17 0435  WBC 27.1*  --  29.0* 30.1*  HGB 6.7* 8.2* 7.7* 7.4*  HCT 20.4* 25.0* 26.1* 22.7*  PLT 466*  --  488* 527*    Coag's Recent Labs  Lab 11/27/17 0310  INR 1.24    Sepsis Markers Recent Labs  Lab 11/26/17 1332  LATICACIDVEN 0.8    ABG Recent Labs  Lab 11/26/17 0800 11/26/17 1301 11/27/17 0945  PHART 7.246* 7.196* 7.223*  PCO2ART 48.7* 55.3* 44.7  PO2ART 98.2 68.4* 91.7    Liver Enzymes Recent Labs  Lab 11/24/17 0446 11/26/17 0456 11/27/17 0310  AST 53* 39 32  ALT 66* 64* 50  ALKPHOS 283* 273* 249*  BILITOT 0.3 0.1* 0.4  ALBUMIN 1.6* 1.6* 1.6*    Cardiac Enzymes No results for input(s): TROPONINI, PROBNP in the last 168 hours.  Glucose Recent Labs  Lab 11/29/17 0748 11/29/17 1214 11/29/17 1551 11/29/17 1959 11/29/17 2319 11/30/17 0357  GLUCAP 131* 144* 127* 120* 120* 112*    Imaging Dg Chest Port 1 View  Result Date: 11/30/2017 CLINICAL DATA:  Respiratory failure EXAM: PORTABLE CHEST 1 VIEW COMPARISON:  11/26/2017 FINDINGS: Support devices are stable. Cardiomegaly. Bilateral lower lobe opacities and  layering effusions. Vascular congestion. IMPRESSION: Continued bilateral effusions and bibasilar atelectasis or infiltrates. Cardiomegaly, vascular congestion. Electronically Signed   By: Charlett Nose M.D.   On: 11/30/2017 07:14     STUDIES:  CT abd/pelvis 2/5 >> progressive of necrotizing pancreatitis, pseudocysts  CULTURES: UC 2/5>>> enterococcus Blood 2/6>>> Sputum 2/7>>> negative  ANTIBIOTICS: Meropenem 2/6>>> Fluconazole 2/5>>> Vancomycin 2/6>>>  SIGNIFICANT EVENTS:   LINES/TUBES: Rt PICC 1/26>>> ETT 2/6>>>  DISCUSSION: She seems to be stabilizing somewhat.  Not requiring pressors, O2 needs on vent improving, and urine output improving.  I am wondering if her most recent set back was related to  enterococcal UTI with sepsis in addition to her pancreatitis.  This will be a difficult d/w family about goals of care.  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 - appreciate help from GI and CCS - case d/w [facility] on 2/07 >> not a candidate for endoscopic drainage - not a surgical candidate - continue meropenem - place NG tube to suction to decompress abdomen  Acute hypoxic/hypercapnic respiratory failure. - full vent support - vent settings adjusted 2/10 - f/u CXR intermittently  Enterococcus UTI. Thrush. - Day 5/7 of vancomycin for UTI - Day 5/5 of diflucan for thrush >> d/c after dose on 2/10  Acute metabolic encephalopathy. - monitor mental status  Acute renal failure. - monitor urine outpt, renal fx - no indication for renal replacement at this time  Anemia of critical illness. - no evidence for bleeding - f/u CBC  Hyperglycemia. - SSI  DVT prophylaxis - SQ heparin SUP - protonix Nutrition - TPN Goals of care - Full code.  Palliative care consulted.   Emma Helling, MD [facility] Pulmonary/Critical Care 11/30/2017, 7:45 AM Pager:  [phone] After 3pm call: [phone]

## 2017-11-30 NOTE — Progress Notes (Signed)
[id number]/Jacquez Sheetz,BSN,RN3,CCM/Remains on full vent support re intubated on [id number] for cm or dc needs as condition allows.

## 2017-12-01 DIAGNOSIS — K8591 Acute pancreatitis with uninfected necrosis, unspecified: Secondary | ICD-10-CM

## 2017-12-01 DIAGNOSIS — J9601 Acute respiratory failure with hypoxia: Secondary | ICD-10-CM

## 2017-12-01 LAB — COMPREHENSIVE METABOLIC PANEL
ALBUMIN: 1.4 g/dL — AB (ref 3.5–5.0)
ALBUMIN: 1.6 g/dL — AB (ref 3.5–5.0)
ALK PHOS: 269 U/L — AB (ref 38–126)
ALT: 36 U/L (ref 14–54)
ALT: 40 U/L (ref 14–54)
ANION GAP: 6 (ref 5–15)
AST: 43 U/L — ABNORMAL HIGH (ref 15–41)
AST: 46 U/L — AB (ref 15–41)
Alkaline Phosphatase: 225 U/L — ABNORMAL HIGH (ref 38–126)
Anion gap: 9 (ref 5–15)
BILIRUBIN TOTAL: 0.5 mg/dL (ref 0.3–1.2)
BILIRUBIN TOTAL: 0.3 mg/dL (ref 0.3–1.2)
BUN: 100 mg/dL — ABNORMAL HIGH (ref 6–20)
BUN: 104 mg/dL — AB (ref 6–20)
CO2: 14 mmol/L — ABNORMAL LOW (ref 22–32)
CO2: 16 mmol/L — ABNORMAL LOW (ref 22–32)
Calcium: 8 mg/dL — ABNORMAL LOW (ref 8.9–10.3)
Calcium: 9 mg/dL (ref 8.9–10.3)
Chloride: 96 mmol/L — ABNORMAL LOW (ref 101–111)
Chloride: 107 mmol/L (ref 101–111)
Creatinine, Ser: 1.87 mg/dL — ABNORMAL HIGH (ref 0.44–1.00)
Creatinine, Ser: 1.74 mg/dL — ABNORMAL HIGH (ref 0.44–1.00)
GFR calc Af Amer: 34 mL/min — ABNORMAL LOW (ref >60)
GFR calc Af Amer: 37 mL/min — ABNORMAL LOW (ref >60)
GFR calc non Af Amer: 32 mL/min — ABNORMAL LOW (ref >60)
GFR, EST NON AFRICAN AMERICAN: 29 mL/min — AB (ref >60)
GLUCOSE: 132 mg/dL — AB (ref 65–99)
Glucose, Bld: 1190 mg/dL (ref 65–99)
Potassium: 3.6 mmol/L (ref 3.5–5.1)
Potassium: 4 mmol/L (ref 3.5–5.1)
Sodium: 116 mmol/L — CL (ref 135–145)
Sodium: 132 mmol/L — ABNORMAL LOW (ref 135–145)
TOTAL PROTEIN: 5.2 g/dL — AB (ref 6.5–8.1)
TOTAL PROTEIN: 6 g/dL — AB (ref 6.5–8.1)

## 2017-12-01 LAB — BLOOD GAS, ARTERIAL
ACID-BASE DEFICIT: 10.6 mmol/L — AB (ref 0.0–2.0)
Bicarbonate: 16.1 mmol/L — ABNORMAL LOW (ref 20.0–28.0)
DRAWN BY: 441261
FIO2: 40
MECHVT: 500 mL
O2 Saturation: 92.7 %
PEEP/CPAP: 8 cmH2O
PH ART: 7.2 — AB (ref 7.350–7.450)
PO2 ART: 76 mmHg — AB (ref 83.0–108.0)
Patient temperature: 99.2
RATE: 22 resp/min
pCO2 arterial: 43 mmHg (ref 32.0–48.0)

## 2017-12-01 LAB — CBC

## 2017-12-01 LAB — GLUCOSE, CAPILLARY
GLUCOSE-CAPILLARY: 134 mg/dL — AB (ref 65–99)
Glucose-Capillary: 126 mg/dL — ABNORMAL HIGH (ref 65–99)
Glucose-Capillary: 135 mg/dL — ABNORMAL HIGH (ref 65–99)
Glucose-Capillary: 147 mg/dL — ABNORMAL HIGH (ref 65–99)
Glucose-Capillary: 147 mg/dL — ABNORMAL HIGH (ref 65–99)
Glucose-Capillary: 131 mg/dL — ABNORMAL HIGH (ref 65–99)

## 2017-12-01 LAB — CBC WITH DIFFERENTIAL/PLATELET
BAND NEUTROPHILS: 0 %
BASOS ABS: 0 10*3/uL (ref 0.0–0.1)
Basophils Relative: 0 %
Blasts: 0 %
EOS ABS: 0.9 10*3/uL — AB (ref 0.0–0.7)
Eosinophils Relative: 3 %
HCT: 24 % — ABNORMAL LOW (ref 36.0–46.0)
Hemoglobin: 7.7 g/dL — ABNORMAL LOW (ref 12.0–15.0)
LYMPHS ABS: 0.6 10*3/uL — AB (ref 0.7–4.0)
Lymphocytes Relative: 2 %
MCH: 29.8 pg (ref 26.0–34.0)
MCHC: 32.1 g/dL (ref 30.0–36.0)
MCV: 93 fL (ref 78.0–100.0)
METAMYELOCYTES PCT: 0 %
MONO ABS: 2.4 10*3/uL — AB (ref 0.1–1.0)
MONOS PCT: 8 %
Myelocytes: 0 %
Neutro Abs: 25.6 10*3/uL — ABNORMAL HIGH (ref 1.7–7.7)
Neutrophils Relative %: 87 %
PLATELETS: 569 10*3/uL — AB (ref 150–400)
Promyelocytes Absolute: 0 %
RBC: 2.58 MIL/uL — ABNORMAL LOW (ref 3.87–5.11)
RDW: 16.7 % — AB (ref 11.5–15.5)
WBC: 29.5 10*3/uL — ABNORMAL HIGH (ref 4.0–10.5)
nRBC: 1 /100 WBC — ABNORMAL HIGH

## 2017-12-01 LAB — DIFFERENTIAL

## 2017-12-01 LAB — MAGNESIUM
MAGNESIUM: 1.8 mg/dL (ref 1.7–2.4)
MAGNESIUM: 2.1 mg/dL (ref 1.7–2.4)

## 2017-12-01 LAB — CULTURE, BLOOD (ROUTINE X 2)
CULTURE: NO GROWTH
CULTURE: NO GROWTH
SPECIAL REQUESTS: ADEQUATE
Special Requests: ADEQUATE

## 2017-12-01 LAB — TRIGLYCERIDES
Triglycerides: 182 mg/dL — ABNORMAL HIGH (ref <150)
Triglycerides: 213 mg/dL — ABNORMAL HIGH (ref <150)

## 2017-12-01 LAB — PREALBUMIN: PREALBUMIN: 10.9 mg/dL — AB (ref 18–38)

## 2017-12-01 LAB — PHOSPHORUS
PHOSPHORUS: 5.9 mg/dL — AB (ref 2.5–4.6)
Phosphorus: 5.5 mg/dL — ABNORMAL HIGH (ref 2.5–4.6)

## 2017-12-01 MED ORDER — CHLORHEXIDINE GLUCONATE 0.12 % MT SOLN
15.0000 mL | Freq: Two times a day (BID) | OROMUCOSAL | Status: DC
  Administered 2017-12-01 – 2017-12-05 (×9): 15 mL via OROMUCOSAL
  Filled 2017-12-01 (×6): qty 15

## 2017-12-01 MED ORDER — TRACE MINERALS CR-CU-MN-SE-ZN 10-1000-500-60 MCG/ML IV SOLN
INTRAVENOUS | Status: AC
  Administered 2017-12-01: 17:00:00 via INTRAVENOUS
  Filled 2017-12-01: qty 2000
  Filled 2017-12-01: qty 2640

## 2017-12-01 MED ORDER — SODIUM BICARBONATE 8.4 % IV SOLN
INTRAVENOUS | Status: DC
  Administered 2017-12-01 – 2017-12-05 (×4): via INTRAVENOUS
  Filled 2017-12-01 (×7): qty 150

## 2017-12-01 NOTE — Progress Notes (Signed)
PHARMACY - ADULT TOTAL PARENTERAL NUTRITION CONSULT NOTE  Pharmacy Consult for TPN Indication: severe necrotizing pancreatitis  Patient Measurements: Height: '5\' 10"'  (177.8 cm) Weight: (!) 310 lb 13.6 oz (141 kg) IBW/kg (Calculated) : 68.5 TPN AdjBW (KG): 85.3 Body mass index is 44.6 kg/m.  Insulin Requirements:  - 13 units Moderate Novolog SSI / 24hrs - 110 units regular insulin in TPN  Current Nutrition: NPO, TPN at goal  IVF: NS at [facility] access: R IJ HD catheter TPN start date: 1/24  ASSESSMENT                                                                                                          HPI: 57 y.o. Female with gallstone pancreatitis with necrosis s/p MRCP on 1/15.  Repeat LFTs and lipase normalizing.  No plans for surgical intervention at this time; planning for delayed cholecystectomy per CCS.  Tube feeding was started but had to be stopped on 1/22 due to nausea/vomiting.  Pharmacy is consulted to begin TPN. Patient's course has been complicated with respiratory failure and intubation and subsequent extubation on 11/09/2017.  She was also on CRRT from 1/18-1/23.  Significant events:  1/28: CL diet 1/29: FL diet 1/30: did not tolerate FLD, back to CLD, weight incr 305 > 314  2/2 Remains volume overloaded, but respiratory status is improved.  Avoiding lasix prior to contrast CT early next week.  Tolerating volume and OK to continue to advance towards goal rate per MD. 2/6 repeat CT scan yesterday reveals interval progression of necrotizing pancreatitis.  CXR shows bilateral pleural effusions and airspace disease. With WBC increase, resuming antibiotics with Meropenem for IAI and possible HCAP. PCCM re-consulted and patient re-intubated.  Today: 12/01/2017  Glucose: No hx DM. CBGs elevated since starting TPN but improved with addition of insulin to TPN (CBG 128-151).  Requiring high amount of insulin in TPN to keep CBGs controlled in setting of  pancreatitis.  Electrolytes: Na 132 K 4.0, CorrCa 10.92 withOUT electrolytes in TPN.  Phos-Calcium product 64.4 (goal <55).  Continue with electrolyte-free TPN bag.  Renal: AKI on CRRT 1/18-1/23. SCr down slightly today.  I/O net -1371 mL, still with adequate UOP per recordings yesterday, weight stable past 3 days.  LFTs: AST/ALT improved to WNL, Alk phos elevated, Tbili low  TGs: 213 (1/15), 332 (1/18), 276 (1/28) 224 (2/4), 213 today  Prealbumin: 6.7 (1/25), 9.3 (1/28), 7.1 (2/4), 10.9 today  NUTRITIONAL GOALS                                                                                           RD recs 2/7: 1500-1704 kcal, >/= 136 grams protein per day  With updated  nutrition goals post re-intubation, will remove the daily lipids for now to continue to meet high protein needs without significantly over feeding calories.  New goal with Clinimix 5/15 at 167m/hr provides 132g/day protein, 1874Kcal/day.   PLAN                                                                                                                         At 1800 today:  Continue Clinimix 5/15 (NO electrolytes) at goal rate of 110 ml/hr.  Hold lipid emulsion to meet high protein needs without overfeeding kcal in this critically ill obese patient.  TPN to contain standard multivitamins and trace elements  Continue insulin in TPN, 110 units per 24 hr.   Continue SSI q4h MODERATE scale  IVF management per MD (currently off).  TPN lab panels on Mondays & Thursdays.   F/u daily.   EPeggyann Juba PharmD, BCPS Pager: [phone] 26322/08/2018, 7:33 AM

## 2017-12-01 NOTE — Progress Notes (Signed)
PULMONARY / CRITICAL CARE MEDICINE   Name: Emma Washington MRN: [id number] DOB: 02/17/1961    ADMISSION DATE:  10/29/2017 CONSULTATION DATE:  11/26/2017 REFERRING MD:  Dr. Janee Morn, [facility]  CHIEF COMPLAINT:  Progressive respiratory failure   HISTORY OF PRESENT ILLNESS:   57 yo female admitted 11/11/2017 with gallstone pancreatitis and necrotizing pancreatitis.  Hospital stay complicated by respiratory failure from ALI, AKI.  She was extubated 1/20.  Since then she developed recurrent hypoxic/hypercapnic respiratory failure requiring reintubation associated with progression of pancreatitis.  PMHx of HTN, GERD, Anemia, Nephrolithiasis, PCOS.  SUBJECTIVE:  No sig change  VITAL SIGNS: BP (!) 93/47 (BP Location: Left Arm)   Pulse (!) 101   Temp 99.4 F (37.4 C) (Oral)   Resp (!) 25   Ht 5\' 10"  (1.778 m)   Wt (!) 310 lb 13.6 oz (141 kg)   LMP 11/07/2011   SpO2 96%   BMI 44.60 kg/m   VENTILATOR SETTINGS: Vent Mode: PRVC FiO2 (%):  [40 %] 40 % Set Rate:  [22 bmp] 22 bmp Vt Set:  [500 mL] 500 mL PEEP:  [5 cmH20] 5 cmH20 Plateau Pressure:  [24 cmH20-25 cmH20] 24 cmH20  INTAKE / OUTPUT: I/O last 3 completed shifts: In: 4205.7 [I.V.:3945.7; NG/GT:60; IV Piggyback:200] Out: 4098 [JXBJY:7829; Emesis/NG output:750]  PHYSICAL EXAMINATION: General: 57 year old chronically ill-appearing female currently sedated on ventilator HEENT: Normocephalic atraumatic orally intubated Pulmonary: Diminished throughout, some air hunger appreciated on exam Cardiac: Regular rate and rhythm Extremities: Brisk cap refill no edema Abdomen: Distended, hypoactive, pain to palp Neuro/psych: Sedated on ventilator  LABS:  BMET Recent Labs  Lab 11/30/17 0435 12/01/17 0325 12/01/17 0543  NA 132* 116* 132*  K 3.6 3.6 4.0  CL 108 96* 107  CO2 17* 14* 16*  BUN 117* 100* 104*  CREATININE 2.22* 1.87* 1.74*  GLUCOSE 127* 1,190* 132*    Electrolytes Recent Labs  Lab 11/29/17 0500 11/30/17 0435  12/01/17 0325 12/01/17 0543  CALCIUM 8.8* 8.9 8.0* 9.0  MG 2.0  --  1.8 2.1  PHOS 5.6*  --  5.5* 5.9*    CBC Recent Labs  Lab 11/30/17 0435 12/01/17 0325 12/01/17 0543  WBC 30.1* QUESTIONABLE RESULTS, RECOMMEND RECOLLECT TO VERIFY 29.5*  HGB 7.4* QUESTIONABLE RESULTS, RECOMMEND RECOLLECT TO VERIFY 7.7*  HCT 22.7* QUESTIONABLE RESULTS, RECOMMEND RECOLLECT TO VERIFY 24.0*  PLT 527* QUESTIONABLE RESULTS, RECOMMEND RECOLLECT TO VERIFY 569*    Coag's Recent Labs  Lab 11/27/17 0310  INR 1.24    Sepsis Markers Recent Labs  Lab 11/26/17 1332  LATICACIDVEN 0.8    ABG Recent Labs  Lab 11/26/17 0800 11/26/17 1301 11/27/17 0945  PHART 7.246* 7.196* 7.223*  PCO2ART 48.7* 55.3* 44.7  PO2ART 98.2 68.4* 91.7    Liver Enzymes Recent Labs  Lab 11/27/17 0310 12/01/17 0325 12/01/17 0543  AST 32 43* 46*  ALT 50 36 40  ALKPHOS 249* 225* 269*  BILITOT 0.4 0.5 0.3  ALBUMIN 1.6* 1.4* 1.6*    Cardiac Enzymes No results for input(s): TROPONINI, PROBNP in the last 168 hours.  Glucose Recent Labs  Lab 11/30/17 1535 11/30/17 1935 11/30/17 2311 12/01/17 0315 12/01/17 0526 12/01/17 0801  GLUCAP 151* 128* 135* 126* 135* 147*    Imaging No results found.   STUDIES:  CT abd/pelvis 2/5 >> progressive of necrotizing pancreatitis, pseudocysts  CULTURES: UC 2/5>>> enterococcus Blood 2/6>>> Sputum 2/7>>> negative  ANTIBIOTICS: Meropenem 2/6>>> Fluconazole 2/5>>>2/10 Vancomycin 2/6>>>2/12  SIGNIFICANT EVENTS:   LINES/TUBES: Rt PICC 1/26>>> ETT 2/6>>>  DISCUSSION:  She seems to be stabilizing somewhat.  Not requiring pressors, O2 needs on vent improving, and urine output improving. It's possible recent set back was related to enterococcal UTI with sepsis in addition to her pancreatitis.  But issue is how poorly she is doing in general. Suspect we are looking at trach soon if we are going to continue support  ASSESSMENT / PLAN:  Necrotizing pancreatitis with  pseudocyst in setting of gallstone pancreatitis. - appreciate help from GI and [facility] - case d/w [facility] on 2/07 >> not a candidate for endoscopic drainage - not a surgical candidate Plan Continue TPN N.p.o. Repeat lipase in a.m.   Acute hypoxic/hypercapnic respiratory failure.  Multifactorial in the setting of decreased abdominal compliance, atelectasis, pain/narcotic requirements and new sepsis PCXR: Endotracheal tubes in satisfactory position she has persistent bibasilar atelectasis/volume loss no significant change when compared to prior film Plan Continue ventilatory support PAD protocol RASS goal 0--1 I think she will need tracheostomy before going to continue care, however things look fairly dismal  Enterococcus UTI. Ginette Pitmanhrush (completed course of diflucan) Plan Day 6/7 vanc for UTI  Acute metabolic encephalopathy. - monitor mental status Plan Supportive care Discontinue fentanyl drip, add back Precedex  Acute renal failure. -scr improved some Plan Renal dose medications Avoid hypotension Follow-up chemistry in a.m.  Metabolic acidosis (NAG)-->this is likely contributing to her vent mechanics.  Plan Repeat abg; add bicarb gtt F/u am chemistry     Anemia of critical illness. - no evidence for bleeding Plan Trend cbc Transfuse if/as needed per protocol   Hyperglycemia. Plan ssi   DVT prophylaxis - SQ heparin SUP - protonix Nutrition - TPN Goals of care - Full code.  Palliative care consulted.   Simonne MartinetPeter E Alyxis Grippi ACNP-BC [facility] Pulmonary/Critical [facility] Pager # [phone] OR # [phone] if no answer

## 2017-12-01 NOTE — Progress Notes (Signed)
Progress Note   Subjective  Chief Complaint: Severe Necrotizing Pancreatitis  This morning patient is found sedated and on the vent. Per Nursing, she has had some increased output from her NG tube which is a clear green color.  She has also started agonal breathing overnight.  No signs of Gi Bleeding per them, no blood out of OG and no bowel movements. Overall, no large change over the weekend.  Critical care has decided to have further discussion with family regarding goals of care going forward.    Objective   Vital signs in last 24 hours: Temp:  [99.2 F (37.3 C)-101.1 F (38.4 C)] 99.9 F (37.7 C) (02/11 0800) Pulse Rate:  [90-115] 101 (02/11 0800) Resp:  [21-35] 25 (02/11 0800) BP: (86-142)/(14-72) 93/47 (02/11 0800) SpO2:  [87 %-100 %] 96 % (02/11 0800) FiO2 (%):  [40 %] 40 % (02/11 0813) Weight:  [310 lb 13.6 oz (141 kg)] 310 lb 13.6 oz (141 kg) (02/11 0443) Last BM Date: 11/26/17 General:   Critically ill, obese, Caucasian female intubated Heart:  Tachycardic Lungs: Abbreviated breaths, on vent Abdomen: Tense, Marked distendension Extremities:  + LE edema Neurologic:  sedated  Intake/Output from previous day: 02/10 0701 - 02/11 0700 In: 2728.8 [I.V.:2628.8; IV Piggyback:100] Out: 4100 [Urine:3350; Emesis/NG output:750] Intake/Output this shift: Total I/O In: 345 [I.V.:245; IV Piggyback:100] Out: -   Lab Results: Recent Labs    11/30/17 0435 12/01/17 0325 12/01/17 0543  WBC 30.1* QUESTIONABLE RESULTS, RECOMMEND RECOLLECT TO VERIFY 29.5*  HGB 7.4* QUESTIONABLE RESULTS, RECOMMEND RECOLLECT TO VERIFY 7.7*  HCT 22.7* QUESTIONABLE RESULTS, RECOMMEND RECOLLECT TO VERIFY 24.0*  PLT 527* QUESTIONABLE RESULTS, RECOMMEND RECOLLECT TO VERIFY 569*   BMET Recent Labs    11/30/17 0435 12/01/17 0325 12/01/17 0543  NA 132* 116* 132*  K 3.6 3.6 4.0  CL 108 96* 107  CO2 17* 14* 16*  GLUCOSE 127* 1,190* 132*  BUN 117* 100* 104*  CREATININE 2.22* 1.87* 1.74*    CALCIUM 8.9 8.0* 9.0   LFT Recent Labs    12/01/17 0543  PROT 6.0*  ALBUMIN 1.6*  AST 46*  ALT 40  ALKPHOS 269*  BILITOT 0.3   Studies/Results: Dg Chest Port 1 View  Result Date: 11/30/2017 CLINICAL DATA:  Respiratory failure EXAM: PORTABLE CHEST 1 VIEW COMPARISON:  11/26/2017 FINDINGS: Support devices are stable. Cardiomegaly. Bilateral lower lobe opacities and layering effusions. Vascular congestion. IMPRESSION: Continued bilateral effusions and bibasilar atelectasis or infiltrates. Cardiomegaly, vascular congestion. Electronically Signed   By: Charlett Nose M.D.   On: 11/30/2017 07:14    Assessment / Plan:   Assessment: 1. Severe Necrotizing Pancreatitis: multiple fluid collections, WBC had been trending down on broad spectrum antibiotics, minimal increase over the weekend 27.1-->29.0-->30.1-->29.5, hgb stable at 7.7, BUN and Crea trending down, currently 1.74, no symptoms of Gi bleed, as previously discussed not a good candidate for endoscopic drainage nor surgical intervention right now, case has been discussed with [facility] and they declined transfer as they have nothing else to offer  Plan: 1. Continue current recommendations including IV PPI and supportive care 2. Please await any further recommendations from Dr. Leone Payor later today  Thank you for your kind consultation, we will continue to follow.   LOS: 28 days   Unk Lightning  12/01/2017, 9:01 AM  Pager # [phone]    [facility] Attending   I have taken an interval history, reviewed the chart and examined the patient. I agree with the Advanced  Practitioner's note, impression and recommendations.   Severe acute necrotizing pancreatitis Pancreatic fluid collections  Care discussed with Anders SimmondsPete Babcock critical care NP and Julaine FusiBeth Golding, DO of hospice.  Very unfortunate situation - multisystem organ failure with little to no chance of full recovery in my opinion. Comfort care makes sense to  me - depending upon family wishes. We do not have anything else to recommend - agree with care she is getting.  Signing off. Please call us back prn.

## 2017-12-01 NOTE — Progress Notes (Signed)
Nutrition Follow-up  DOCUMENTATION CODES:   Morbid obesity  INTERVENTION:  - Continue TPN per Pharmacy.   NUTRITION DIAGNOSIS:   Inadequate oral intake related to inability to eat as evidenced by NPO status. 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 Other associated symptoms include nausea, vomiting and decreased PO intake (since 1/13). Pertinent PMHx significant for nephrolithiasis.   Significant Events: 1/15- MRCP 1/18- intubated and plan for OGT placement and trickle TF  1/19- CRRT started 1/20- extubated and OGT removed 1/21- Panda placed in R nare 1/22- TF stopped d/t N/V 1/23- plan to hold CRRT x1-2 days and assess for renal recovery 1/24- TPN initiation; ILE to be held x7 days 1/25: Pt pulled NGT, refused replacement 1/30: CLD order placed 2/2: Advancement in TPN rate from 60 mL/hr to 83 mL/hr 2/3 Advancement in TPN rate to goal rate of 110 mL/hr. 2/6: re-intubated and OGT placed   Pt remains intubated with OGT to LIS with 250cc in wall canister at this time. Weight trending back up slightly; will continue to monitor closely. Pt has double lumen PICC in R cephalic and is receiving TPN at goal: Clinimix 5/15 @ 110 mL/hr. This regimen is providing 132 grams of protein (97% estimated protein need) and 1874 kcal (110% estimated kcal need).   Per Brooke's, Surgery PA, note this AM: "GI reached out to MD at [facility], no role for endoscopic drainage/therapy given diffuse nature of the necrosis and fluid collections. Currently would not recommend any perc drainage due to risk of causing infection, would use this as last resort to rule out abscess. Ultimately will consider surgical cystgastrostomy at 6  weeks if pseudocyst unresolved and patient survives."    Patient is currently intubated on ventilator support MV: 13.1 L/min Temp (24hrs), Avg:99.8 F (37.7 C), Min:99.2 F (37.3 C), Max:101.1 F (38.4 C) Propofol: none BP: 100/41 and MAP: 60  Medications reviewed; sliding scale Novolog. Labs reviewed; CBGs: 126, 135, and 147 mg/dL this AM, Na: 132 mmol/L, BUN: 104 mg/dL, creatinine: 1.74 mg/dL, Phos: 5.9 mg/dL, Alk Phos elevated, GFR: 32 mL/min.   Drip: Fentanyl @ 150 mcg/hr.    Diet Order:  TPN (CLINIMIX) Adult without lytes TPN (CLINIMIX) Adult without lytes  EDUCATION NEEDS:   No education needs have been identified at this time  Skin:  Skin Assessment: Reviewed RN Assessment  Last BM:  2/6; smear on 2/10  Height:   Ht Readings from Last 1 Encounters:  11/19/17 '5\' 10"'  (1.778 m)    Weight:   Wt Readings from Last 1 Encounters:  12/01/17 (!) 310 lb 13.6 oz (141 kg)    Ideal Body Weight:  65.9 kg  BMI:  Body mass index is 44.6 kg/m.  Estimated Nutritional Needs:   Kcal:  1500-1704 kcal/kg (22-25 kcal/kg IBW)  Protein:  >/= 136 grams (2 grams/kg IBW)  Fluid:  >/= 1.8 L/day     Jarome Matin, MS, RD, LDN, [facility] Inpatient Clinical Dietitian Pager # [phone] After hours/weekend pager # [phone]

## 2017-12-01 NOTE — Progress Notes (Signed)
[facility] Progress Note     Subjective: CC-  Sedated on vent. Still on merrem and vancomycin. Good UOP and creatine trending down (Cr 1.74).  Objective: Vital signs in last 24 hours: Temp:  [99.2 F (37.3 C)-101.1 F (38.4 C)] 99.4 F (37.4 C) (02/11 0316) Pulse Rate:  [90-115] 101 (02/11 0800) Resp:  [21-35] 25 (02/11 0800) BP: (86-142)/(14-72) 93/47 (02/11 0800) SpO2:  [87 %-100 %] 96 % (02/11 0800) FiO2 (%):  [40 %] 40 % (02/11 0800) Weight:  [310 lb 13.6 oz (141 kg)] 310 lb 13.6 oz (141 kg) (02/11 0443) Last BM Date: 11/26/17  Intake/Output from previous day: 02/10 0701 - 02/11 0700 In: 2728.8 [I.V.:2628.8; IV Piggyback:100] Out: 4100 [Urine:3350; Emesis/NG output:750] Intake/Output this shift: No intake/output data recorded.  PE: Gen:  Alert, NAD, sedated HEENT: pupils equal and round, ETT in place Card:  RRR, no M/G/R heard Pulm:  Coarse breath sounds bilaterally, mechanical ventilation Abd: distended and mildly firm, hypoactive BS, does not appear to have any TTP Ext:  2+ pitting edema BLE Neuro: sedated, opens eyes with stimulation  Lab Results:  Recent Labs    12/01/17 0325 12/01/17 0543  WBC QUESTIONABLE RESULTS, RECOMMEND RECOLLECT TO VERIFY 29.5*  HGB QUESTIONABLE RESULTS, RECOMMEND RECOLLECT TO VERIFY 7.7*  HCT QUESTIONABLE RESULTS, RECOMMEND RECOLLECT TO VERIFY 24.0*  PLT QUESTIONABLE RESULTS, RECOMMEND RECOLLECT TO VERIFY 569*   BMET Recent Labs    12/01/17 0325 12/01/17 0543  NA 116* 132*  K 3.6 4.0  CL 96* 107  CO2 14* 16*  GLUCOSE 1,190* 132*  BUN 100* 104*  CREATININE 1.87* 1.74*  CALCIUM 8.0* 9.0   PT/INR No results for input(s): LABPROT, INR in the last 72 hours. CMP     Component Value Date/Time   NA 132 (L) 12/01/2017 0543   K 4.0 12/01/2017 0543   CL 107 12/01/2017 0543   CO2 16 (L) 12/01/2017 0543   GLUCOSE 132 (H) 12/01/2017 0543   BUN 104 (H) 12/01/2017 0543   CREATININE 1.74 (H) 12/01/2017 0543   CALCIUM 9.0 12/01/2017 0543   PROT 6.0 (L) 12/01/2017 0543   ALBUMIN 1.6 (L) 12/01/2017 0543   AST 46 (H) 12/01/2017 0543   ALT 40 12/01/2017 0543   ALKPHOS 269 (H) 12/01/2017 0543   BILITOT 0.3 12/01/2017 0543   GFRNONAA 32 (L) 12/01/2017 0543   GFRAA 37 (L) 12/01/2017 0543   Lipase     Component Value Date/Time   LIPASE 54 (H) 11/13/2017 1110       Studies/Results: Dg Chest Port 1 View  Result Date: 11/30/2017 CLINICAL DATA:  Respiratory failure EXAM: PORTABLE CHEST 1 VIEW COMPARISON:  11/26/2017 FINDINGS: Support devices are stable. Cardiomegaly. Bilateral lower lobe opacities and layering effusions. Vascular congestion. IMPRESSION: Continued bilateral effusions and bibasilar atelectasis or infiltrates. Cardiomegaly, vascular congestion. Electronically Signed   By: Charlett Nose M.D.   On: 11/30/2017 07:14    Anti-infectives: Anti-infectives (From admission, onward)   Start     Dose/Rate Route Frequency Ordered Stop   11/29/17 1400  vancomycin (VANCOCIN) 1,750 mg in sodium chloride 0.9 % 500 mL IVPB     1,750 mg 250 mL/hr over 120 Minutes Intravenous Every 48 hours 11/29/17 1320     11/28/17 1800  fluconazole (DIFLUCAN) IVPB 200 mg     200 mg 100 mL/hr over 60 Minutes Intravenous Every 24 hours 11/28/17 0740 11/30/17 1843   11/27/17 1200  meropenem (MERREM) 2 g in sodium chloride 0.9 % 100 mL IVPB  2 g 200 mL/hr over 30 Minutes Intravenous Every 12 hours 11/27/17 0925     11/27/17 1100  vancomycin (VANCOCIN) 2,500 mg in sodium chloride 0.9 % 500 mL IVPB     2,500 mg 250 mL/hr over 120 Minutes Intravenous  Once 11/27/17 1006 11/27/17 1325   11/26/17 1800  fluconazole (DIFLUCAN) IVPB 100 mg  Status:  Discontinued     100 mg 50 mL/hr over 60 Minutes Intravenous Every 24 hours 11/25/17 1708 11/28/17 0740   11/26/17 1200  meropenem (MERREM) 1 g in sodium chloride 0.9 % 100 mL IVPB  Status:  Discontinued     1 g 200 mL/hr over 30 Minutes Intravenous Every 8 hours 11/26/17  0956 11/27/17 0925   11/25/17 1800  fluconazole (DIFLUCAN) IVPB 200 mg     200 mg 100 mL/hr over 60 Minutes Intravenous  Once 11/25/17 1708 11/25/17 1911   11/12/17 1800  meropenem (MERREM) 1 g in sodium chloride 0.9 % 100 mL IVPB  Status:  Discontinued     1 g 200 mL/hr over 30 Minutes Intravenous Every 8 hours 11/12/17 1115 11/17/17 0954   11/11/17 1000  meropenem (MERREM) 1 g in sodium chloride 0.9 % 100 mL IVPB  Status:  Discontinued     1 g 200 mL/hr over 30 Minutes Intravenous Every 12 hours 11/11/17 0911 11/12/17 1115   11/09/17 1000  piperacillin-tazobactam (ZOSYN) IVPB 3.375 g  Status:  Discontinued     3.375 g 12.5 mL/hr over 240 Minutes Intravenous Every 8 hours 11/09/17 0614 11/09/17 0615   11/09/17 1000  piperacillin-tazobactam (ZOSYN) IVPB 3.375 g  Status:  Discontinued     3.375 g 100 mL/hr over 30 Minutes Intravenous Every 6 hours 11/09/17 0616 11/11/17 0911   11/07/17 2200  piperacillin-tazobactam (ZOSYN) IVPB 2.25 g  Status:  Discontinued     2.25 g 100 mL/hr over 30 Minutes Intravenous Every 8 hours 11/07/17 1218 11/07/17 1741   11/07/17 2200  piperacillin-tazobactam (ZOSYN) IVPB 3.375 g  Status:  Discontinued     3.375 g 12.5 mL/hr over 240 Minutes Intravenous Every 6 hours 11/07/17 1741 11/07/17 1747   11/07/17 2200  piperacillin-tazobactam (ZOSYN) IVPB 3.375 g  Status:  Discontinued     3.375 g 100 mL/hr over 30 Minutes Intravenous Every 6 hours 11/07/17 1747 11/09/17 0613   11/04/17 1100  piperacillin-tazobactam (ZOSYN) IVPB 3.375 g  Status:  Discontinued     3.375 g 12.5 mL/hr over 240 Minutes Intravenous Every 8 hours 11/04/17 1059 11/07/17 1218   10/22/2017 1200  piperacillin-tazobactam (ZOSYN) IVPB 3.375 g     3.375 g 100 mL/hr over 30 Minutes Intravenous  Once 11/07/2017 1157 11/11/2017 1309       Assessment/Plan ARF - required CRRT last month,Cr down to 1.74 today VDRF - extubated1/20, reintubated 2/6 Obesity Nephrolithiasis Oral thrush - on  fluconazole Protein calorie malnutrition Anemia of critical disease Enterococcus faecium UTI - on IV vancomycin  Necrotizing pancreatitis with pseudocyst in setting of gallstone pancreatitis - CT 2/6 showed likely pseudocyst development and necrosis, consistent with severe pancreatitis, no evidence of gas formation or abscess - started back on merrem and vancomycin 2/6 - GI reached out to MD at [facility], no role for endoscopic drainage/therapy given diffuse nature of the necrosis and fluid collections - currently would not recommend any perc drainage due to risk of causing infection, would use this as last resort to rule out abscess - ultimately will consider surgical cystgastrostomy at 6 weeks if pseudocyst unresolved and patient  survives - palliative care following - WBC 29.5 <<30.1<<29.0<<27.1  FEN:NPO; TPN VTE: SCDs,SQ heparin ID: meropenem 2/6>>, vancomycin 2/6>>, fluconazole 2/5>>2/10    LOS: 28 days    Franne FortsBrooke A Meuth , [facility] 12/01/2017, 8:22 AM Pager: [phone] Consults: [phone] Mon-Fri 7:00 am-4:30 pm Sat-Sun 7:00 am-11:30 am

## 2017-12-02 LAB — GLUCOSE, CAPILLARY
GLUCOSE-CAPILLARY: 143 mg/dL — AB (ref 65–99)
GLUCOSE-CAPILLARY: 142 mg/dL — AB (ref 65–99)
GLUCOSE-CAPILLARY: 118 mg/dL — AB (ref 65–99)
Glucose-Capillary: 134 mg/dL — ABNORMAL HIGH (ref 65–99)
Glucose-Capillary: 154 mg/dL — ABNORMAL HIGH (ref 65–99)
Glucose-Capillary: 121 mg/dL — ABNORMAL HIGH (ref 65–99)

## 2017-12-02 LAB — COMPREHENSIVE METABOLIC PANEL
ALK PHOS: 254 U/L — AB (ref 38–126)
ALT: 39 U/L (ref 14–54)
AST: 49 U/L — ABNORMAL HIGH (ref 15–41)
Albumin: 1.6 g/dL — ABNORMAL LOW (ref 3.5–5.0)
Anion gap: 10 (ref 5–15)
BUN: 108 mg/dL — AB (ref 6–20)
CALCIUM: 9.2 mg/dL (ref 8.9–10.3)
CO2: 17 mmol/L — AB (ref 22–32)
CREATININE: 1.6 mg/dL — AB (ref 0.44–1.00)
Chloride: 111 mmol/L (ref 101–111)
GFR calc non Af Amer: 35 mL/min — ABNORMAL LOW (ref >60)
GFR, EST AFRICAN AMERICAN: 41 mL/min — AB (ref >60)
Glucose, Bld: 131 mg/dL — ABNORMAL HIGH (ref 65–99)
Potassium: 4.2 mmol/L (ref 3.5–5.1)
SODIUM: 138 mmol/L (ref 135–145)
Total Bilirubin: 0.4 mg/dL (ref 0.3–1.2)
Total Protein: 5.6 g/dL — ABNORMAL LOW (ref 6.5–8.1)

## 2017-12-02 LAB — MAGNESIUM: Magnesium: 2.1 mg/dL (ref 1.7–2.4)

## 2017-12-02 LAB — LIPASE, BLOOD: Lipase: 74 U/L — ABNORMAL HIGH (ref 11–51)

## 2017-12-02 LAB — PHOSPHORUS: PHOSPHORUS: 5.6 mg/dL — AB (ref 2.5–4.6)

## 2017-12-02 MED ORDER — PHENYLEPHRINE HCL-NACL 10-0.9 MG/250ML-% IV SOLN
0.0000 ug/min | INTRAVENOUS | Status: DC
  Administered 2017-12-02: 25 ug/min via INTRAVENOUS
  Administered 2017-12-02 – 2017-12-03 (×3): 50 ug/min via INTRAVENOUS
  Administered 2017-12-03: 40 ug/min via INTRAVENOUS
  Administered 2017-12-03 – 2017-12-04 (×5): 50 ug/min via INTRAVENOUS
  Filled 2017-12-02 (×13): qty 250

## 2017-12-02 MED ORDER — DEXMEDETOMIDINE HCL IN NACL 200 MCG/50ML IV SOLN
0.0000 ug/kg/h | INTRAVENOUS | Status: DC
  Administered 2017-12-02 (×2): 0.4 ug/kg/h via INTRAVENOUS
  Filled 2017-12-02 (×2): qty 50

## 2017-12-02 MED ORDER — TRACE MINERALS CR-CU-MN-SE-ZN 10-1000-500-60 MCG/ML IV SOLN
INTRAVENOUS | Status: AC
  Administered 2017-12-02: 17:00:00 via INTRAVENOUS
  Filled 2017-12-02: qty 2640
  Filled 2017-12-02: qty 1000

## 2017-12-02 MED ORDER — SODIUM CHLORIDE 0.9 % IV SOLN
0.5000 mg/h | INTRAVENOUS | Status: DC
  Administered 2017-12-02: 0.5 mg/h via INTRAVENOUS
  Administered 2017-12-03: 2 mg/h via INTRAVENOUS
  Filled 2017-12-02 (×3): qty 10

## 2017-12-02 MED ORDER — SODIUM CHLORIDE 0.9 % IV SOLN
2.0000 g | Freq: Three times a day (TID) | INTRAVENOUS | Status: DC
  Administered 2017-12-02 – 2017-12-05 (×10): 2 g via INTRAVENOUS
  Filled 2017-12-02 (×14): qty 2

## 2017-12-02 MED ORDER — VANCOMYCIN HCL 10 G IV SOLR
1250.0000 mg | INTRAVENOUS | Status: AC
  Administered 2017-12-02: 1250 mg via INTRAVENOUS
  Filled 2017-12-02: qty 1250

## 2017-12-02 NOTE — Progress Notes (Signed)
Patient became bradycardic when inline suctioned during 4pm rounds. Bedside RN with RT. MD made aware.

## 2017-12-02 NOTE — Progress Notes (Signed)
PULMONARY / CRITICAL CARE MEDICINE   Name: Emma Washington MRN: [id number] DOB: Apr 10, 1961    ADMISSION DATE:  07-31-2018 CONSULTATION DATE:  11/26/2017 REFERRING MD:  Dr. Janee Mornhompson, [facility]  CHIEF COMPLAINT:  Progressive respiratory failure   HISTORY OF PRESENT ILLNESS:   57 yo female admitted 2018-01-16 with gallstone pancreatitis and necrotizing pancreatitis.  Hospital stay complicated by respiratory failure from ALI, AKI.  She was extubated 1/20.  Since then she developed recurrent hypoxic/hypercapnic respiratory failure requiring reintubation associated with progression of pancreatitis.  PMHx of HTN, GERD, Anemia, Nephrolithiasis, PCOS.  SUBJECTIVE:  No change   VITAL SIGNS: BP (!) 144/50   Pulse 97   Temp (!) 100.7 F (38.2 C) (Axillary)   Resp (!) 28   Ht 5\' 10"  (1.778 m)   Wt (!) 314 lb 13.1 oz (142.8 kg)   LMP 11/07/2011   SpO2 96%   BMI 45.17 kg/m   VENTILATOR SETTINGS: Vent Mode: PRVC FiO2 (%):  [40 %] 40 % Set Rate:  [22 bmp] 22 bmp Vt Set:  [500 mL] 500 mL PEEP:  [5 cmH20-8 cmH20] 5 cmH20 Plateau Pressure:  [23 cmH20-28 cmH20] 28 cmH20  INTAKE / OUTPUT: I/O last 3 completed shifts: In: 5800.1 [I.V.:5100.1; IV Piggyback:700] Out: 6100 [Urine:5050; Emesis/NG output:1050]  PHYSICAL EXAMINATION: General: This is a chronically ill-appearing 57 year old female.  She is sedated on the ventilator, appears uncomfortable. HEENT: Normocephalic atraumatic orally intubated Pulmonary: Scattered rhonchi some accessory use, still appears "air hungry" Cardiac: Regular rate and rhythm Abdomen: Softer, hypoactive, positive bowel sounds, no pain to palpation Extremities/musculoskeletal:, No significant edema Neuro/psych: Sedated and localizes  LABS:  BMET Recent Labs  Lab 12/01/17 0325 12/01/17 0543 12/02/17 0405  NA 116* 132* 138  K 3.6 4.0 4.2  CL 96* 107 111  CO2 14* 16* 17*  BUN 100* 104* 108*  CREATININE 1.87* 1.74* 1.60*  GLUCOSE 1,190* 132* 131*     Electrolytes Recent Labs  Lab 12/01/17 0325 12/01/17 0543 12/02/17 0405  CALCIUM 8.0* 9.0 9.2  MG 1.8 2.1 2.1  PHOS 5.5* 5.9* 5.6*    CBC Recent Labs  Lab 11/30/17 0435 12/01/17 0325 12/01/17 0543  WBC 30.1* QUESTIONABLE RESULTS, RECOMMEND RECOLLECT TO VERIFY 29.5*  HGB 7.4* QUESTIONABLE RESULTS, RECOMMEND RECOLLECT TO VERIFY 7.7*  HCT 22.7* QUESTIONABLE RESULTS, RECOMMEND RECOLLECT TO VERIFY 24.0*  PLT 527* QUESTIONABLE RESULTS, RECOMMEND RECOLLECT TO VERIFY 569*    Coag's Recent Labs  Lab 11/27/17 0310  INR 1.24    Sepsis Markers Recent Labs  Lab 11/26/17 1332  LATICACIDVEN 0.8    ABG Recent Labs  Lab 11/26/17 1301 11/27/17 0945 12/01/17 1058  PHART 7.196* 7.223* 7.200*  PCO2ART 55.3* 44.7 43.0  PO2ART 68.4* 91.7 76.0*    Liver Enzymes Recent Labs  Lab 12/01/17 0325 12/01/17 0543 12/02/17 0405  AST 43* 46* 49*  ALT 36 40 39  ALKPHOS 225* 269* 254*  BILITOT 0.5 0.3 0.4  ALBUMIN 1.4* 1.6* 1.6*    Cardiac Enzymes No results for input(s): TROPONINI, PROBNP in the last 168 hours.  Glucose Recent Labs  Lab 12/01/17 1152 12/01/17 1558 12/01/17 1930 12/01/17 2316 12/02/17 0323 12/02/17 0752  GLUCAP 143* 134* 147* 131* 134* 142*    Imaging No results found.   STUDIES:  CT abd/pelvis 2/5 >> progressive of necrotizing pancreatitis, pseudocysts  CULTURES: UC 2/5>>> enterococcus Blood 2/6>>> Sputum 2/7>>> negative  ANTIBIOTICS: Meropenem 2/6>>> Fluconazole 2/5>>>2/10 Vancomycin 2/6>>>2/12  SIGNIFICANT EVENTS:   LINES/TUBES: Rt PICC 1/26>>> ETT 2/6>>>  DISCUSSION: She seems  to be stabilizing somewhat.  Not requiring pressors, O2 needs on vent improving, and urine output improving. It's possible recent set back was related to enterococcal UTI with sepsis in addition to her pancreatitis.  But issue is how poorly she is doing in general.  -prognosis very poor. Would have very long recovery w/ high risk for set-backs. 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 - appreciate help from GI and CCS - case d/w [facility] on 2/07 >> not a candidate for endoscopic drainage - not a surgical candidate Plan NPO TPN Cont meropenem   Acute hypoxic/hypercapnic respiratory failure.  Multifactorial in the setting of decreased abdominal compliance, atelectasis, pain/narcotic requirements and new sepsis PCXR: Endotracheal tubes in satisfactory position she has persistent bibasilar atelectasis/volume loss no significant change when compared to prior film Plan Cont current vent care PAD protocol RASS 0 to -1 Continue ventilatory support At some point this week looking at one way extubation   Enterococcus UTI. Thrush (completed course of diflucan) Plan Day 7/7 vanc   Acute metabolic encephalopathy. - monitor mental status Plan Cont fent gtt Add back precedex  Acute renal failure. -scr improved some Plan Renal dose meds F/u am chemistry   Metabolic acidosis (NAG)-->this is likely contributing to her vent mechanics.  Plan Cont bicarb gtt  Anemia of critical illness. - no evidence for bleeding Plan Trend cbc Transfuse per protocol  Hyperglycemia. Plan ssi  DVT prophylaxis - SQ heparin SUP - protonix Nutrition - TPN Goals of care - Full code.  Palliative care consulted.   Simonne MartinetPeter E Babcock ACNP-BC [facility] Pulmonary/Critical Care Pager # [phone] OR # [phone] if no answer  Attestation The patient remains on the ventilaor. She remains hemodynamically stable. Patient had presented with necrotizing pancreatitis. Vitals are stable.  The patient has now been intubated at close to 2 weeks.  Has persistent leukocytosis. Palliative care has discussed the present issues. The patient remains on a bicarb drip for her NAG acidosis. Overall outlook remains poor.

## 2017-12-02 NOTE — Progress Notes (Signed)
PULMONARY / CRITICAL CARE MEDICINE   Name: Emma Washington MRN: [id number] DOB: Apr 10, 1961    ADMISSION DATE:  07-31-2018 CONSULTATION DATE:  11/26/2017 REFERRING MD:  Dr. Janee Mornhompson, [facility]  CHIEF COMPLAINT:  Progressive respiratory failure   HISTORY OF PRESENT ILLNESS:   57 yo female admitted 2018-01-16 with gallstone pancreatitis and necrotizing pancreatitis.  Hospital stay complicated by respiratory failure from ALI, AKI.  She was extubated 1/20.  Since then she developed recurrent hypoxic/hypercapnic respiratory failure requiring reintubation associated with progression of pancreatitis.  PMHx of HTN, GERD, Anemia, Nephrolithiasis, PCOS.  SUBJECTIVE:  No change   VITAL SIGNS: BP (!) 144/50   Pulse 97   Temp (!) 100.7 F (38.2 C) (Axillary)   Resp (!) 28   Ht 5\' 10"  (1.778 m)   Wt (!) 314 lb 13.1 oz (142.8 kg)   LMP 11/07/2011   SpO2 96%   BMI 45.17 kg/m   VENTILATOR SETTINGS: Vent Mode: PRVC FiO2 (%):  [40 %] 40 % Set Rate:  [22 bmp] 22 bmp Vt Set:  [500 mL] 500 mL PEEP:  [5 cmH20-8 cmH20] 5 cmH20 Plateau Pressure:  [23 cmH20-28 cmH20] 28 cmH20  INTAKE / OUTPUT: I/O last 3 completed shifts: In: 5800.1 [I.V.:5100.1; IV Piggyback:700] Out: 6100 [Urine:5050; Emesis/NG output:1050]  PHYSICAL EXAMINATION: General: This is a chronically ill-appearing 57 year old female.  She is sedated on the ventilator, appears uncomfortable. HEENT: Normocephalic atraumatic orally intubated Pulmonary: Scattered rhonchi some accessory use, still appears "air hungry" Cardiac: Regular rate and rhythm Abdomen: Softer, hypoactive, positive bowel sounds, no pain to palpation Extremities/musculoskeletal:, No significant edema Neuro/psych: Sedated and localizes  LABS:  BMET Recent Labs  Lab 12/01/17 0325 12/01/17 0543 12/02/17 0405  NA 116* 132* 138  K 3.6 4.0 4.2  CL 96* 107 111  CO2 14* 16* 17*  BUN 100* 104* 108*  CREATININE 1.87* 1.74* 1.60*  GLUCOSE 1,190* 132* 131*     Electrolytes Recent Labs  Lab 12/01/17 0325 12/01/17 0543 12/02/17 0405  CALCIUM 8.0* 9.0 9.2  MG 1.8 2.1 2.1  PHOS 5.5* 5.9* 5.6*    CBC Recent Labs  Lab 11/30/17 0435 12/01/17 0325 12/01/17 0543  WBC 30.1* QUESTIONABLE RESULTS, RECOMMEND RECOLLECT TO VERIFY 29.5*  HGB 7.4* QUESTIONABLE RESULTS, RECOMMEND RECOLLECT TO VERIFY 7.7*  HCT 22.7* QUESTIONABLE RESULTS, RECOMMEND RECOLLECT TO VERIFY 24.0*  PLT 527* QUESTIONABLE RESULTS, RECOMMEND RECOLLECT TO VERIFY 569*    Coag's Recent Labs  Lab 11/27/17 0310  INR 1.24    Sepsis Markers Recent Labs  Lab 11/26/17 1332  LATICACIDVEN 0.8    ABG Recent Labs  Lab 11/26/17 1301 11/27/17 0945 12/01/17 1058  PHART 7.196* 7.223* 7.200*  PCO2ART 55.3* 44.7 43.0  PO2ART 68.4* 91.7 76.0*    Liver Enzymes Recent Labs  Lab 12/01/17 0325 12/01/17 0543 12/02/17 0405  AST 43* 46* 49*  ALT 36 40 39  ALKPHOS 225* 269* 254*  BILITOT 0.5 0.3 0.4  ALBUMIN 1.4* 1.6* 1.6*    Cardiac Enzymes No results for input(s): TROPONINI, PROBNP in the last 168 hours.  Glucose Recent Labs  Lab 12/01/17 1152 12/01/17 1558 12/01/17 1930 12/01/17 2316 12/02/17 0323 12/02/17 0752  GLUCAP 143* 134* 147* 131* 134* 142*    Imaging No results found.   STUDIES:  CT abd/pelvis 2/5 >> progressive of necrotizing pancreatitis, pseudocysts  CULTURES: UC 2/5>>> enterococcus Blood 2/6>>> Sputum 2/7>>> negative  ANTIBIOTICS: Meropenem 2/6>>> Fluconazole 2/5>>>2/10 Vancomycin 2/6>>>2/12  SIGNIFICANT EVENTS:   LINES/TUBES: Rt PICC 1/26>>> ETT 2/6>>>  DISCUSSION: She seems  to be stabilizing somewhat.  Not requiring pressors, O2 needs on vent improving, and urine output improving. It's possible recent set back was related to enterococcal UTI with sepsis in addition to her pancreatitis.  But issue is how poorly she is doing in general.  -prognosis very poor. Would have very long recovery w/ high risk for set-backs. 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 - appreciate help from GI and CCS - case d/w [facility] on 2/07 >> not a candidate for endoscopic drainage - not a surgical candidate Plan NPO TPN Cont meropenem   Acute hypoxic/hypercapnic respiratory failure.  Multifactorial in the setting of decreased abdominal compliance, atelectasis, pain/narcotic requirements and new sepsis PCXR: Endotracheal tubes in satisfactory position she has persistent bibasilar atelectasis/volume loss no significant change when compared to prior film Plan Cont current vent care PAD protocol RASS 0 to -1 Continue ventilatory support At some point this week looking at one way extubation   Enterococcus UTI. Thrush (completed course of diflucan) Plan Day 7/7 vanc   Acute metabolic encephalopathy. - monitor mental status Plan Cont fent gtt Add back precedex  Acute renal failure. -scr improved some Plan Renal dose meds F/u am chemistry   Metabolic acidosis (NAG)-->this is likely contributing to her vent mechanics.  Plan Cont bicarb gtt  Anemia of critical illness. - no evidence for bleeding Plan Trend cbc Transfuse per protocol  Hyperglycemia. Plan ssi  DVT prophylaxis - SQ heparin SUP - protonix Nutrition - TPN Goals of care - Full code.  Palliative care consulted.   Simonne Martinet ACNP-BC [facility] Pulmonary/Critical Care Pager # [phone] OR # [phone] if no answer

## 2017-12-02 NOTE — Progress Notes (Signed)
PHARMACY - ADULT TOTAL PARENTERAL NUTRITION CONSULT NOTE  Pharmacy Consult for TPN Indication: severe necrotizing pancreatitis  Patient Measurements: Height: 5' 10" (177.8 cm) Weight: (!) 314 lb 13.1 oz (142.8 kg) IBW/kg (Calculated) : 68.5 TPN AdjBW (KG): 85.3 Body mass index is 45.17 kg/m.  Insulin Requirements:  - 13 units Moderate Novolog SSI / 24hrs - 110 units regular insulin in TPN  Current Nutrition: NPO, TPN at goal  IVF: D5W with 161mq sodium bicarb at 58mhr started 2/11  Central access: R IJ HD catheter TPN start date: 1/24  ASSESSMENT                                                                                                          HPI: [id number]. Female with gallstone pancreatitis with necrosis s/p MRCP on 1/15.  Repeat LFTs and lipase normalizing.  No plans for surgical intervention at this time; planning for delayed cholecystectomy per CCS.  Tube feeding was started but had to be stopped on 1/22 due to nausea/vomiting.  Pharmacy is consulted to begin TPN. Patient's course has been complicated with respiratory failure and intubation and subsequent extubation on 11/09/2017.  She was also on CRRT from 1/18-1/23.  Significant events:  1/28: CL diet 1/29: FL diet 1/30: did not tolerate FLD, back to CLD, weight incr 305 > 314  2/2 Remains volume overloaded, but respiratory status is improved.  Avoiding lasix prior to contrast CT early next week.  Tolerating volume and OK to continue to advance towards goal rate per MD. 2/6 repeat CT scan yesterday reveals interval progression of necrotizing pancreatitis.  CXR shows bilateral pleural effusions and airspace disease. With WBC increase, resuming antibiotics with Meropenem for IAI and possible HCAP. PCCM re-consulted and patient re-intubated. 2/11 Bicarb drip at 5077mr added by CCM for NAG acidosis. Total IVF rate now 160m68m - monitor closely for development of fluid overload and consider decreasing TPN rate if  necessary.  Today: 12/02/2017  Glucose: No hx DM. CBGs elevated since starting TPN but improved with addition of insulin to TPN (CBG 128-151).  Requiring high amount of insulin in TPN to keep CBGs controlled in setting of pancreatitis.  Electrolytes:   Na had been low but now WNL  K WNL  CorrCa rising 11.12 withOUT electrolytes in TPN.  Phos-Calcium product 62.27 (goal <55).  Continue with electrolyte-free TPN bag.  Renal: AKI on CRRT 1/18-1/23. SCr down.  I/O net +721 mL, still with adequate UOP per recordings yesterday, weight up.  LFTs: AST/ALT improved to WNL, Alk phos elevated, Tbili normal.  TGs: 213 (1/15), 332 (1/18), 276 (1/28) 224 (2/4), 213 (2/11)  Prealbumin: 6.7 (1/25), 9.3 (1/28), 7.1 (2/4), 10.9 (2/11)  NUTRITIONAL GOALS  RD recs 2/7: 1500-1704 kcal, >/= 136 grams protein per day  With updated nutrition goals post re-intubation, will remove the daily lipids for now to continue to meet high protein needs without significantly over feeding calories.  New goal with Clinimix 5/15 at 110ml/hr provides 132g/day protein, 1874Kcal/day.   PLAN                                                                                                                         At 1800 today:  Continue Clinimix 5/15 (NO electrolytes) at goal rate of 110 ml/hr. Spoke with CCM regarding addition of bicarb drip 2/11 for NAG acidosis - may need to back off on TPN rate if patient develops fluid overload. Will assess this daily - ok to keep at current rate today per d/w CCM.  Hold lipid emulsion to meet high protein needs without overfeeding kcal in this critically ill obese patient.  TPN to contain standard multivitamins and trace elements  Continue insulin in TPN, 110 units per 24 hr.   Continue SSI q4h MODERATE scale  IVF management per MD (currently off).  TPN lab panels on Mondays & Thursdays.    F/u daily.    , PharmD, BCPS Pager: [phone] 12/02/2017, 7:02 AM        

## 2017-12-02 NOTE — Progress Notes (Addendum)
Pharmacy Antibiotic Note  Emma Washington is a 57 y.o. female admitted on May 08, 2018 with necrotizing pancreatitis with pseudocyst in the setting of gallstone pancreatitis; patient is not a candidate for surgery or endoscopic drainage.  Hospital course complicated by respiratory failure, AK  .  Pharmacy has been consulted for vancomycin for enterococcal UTI and meropenem dosing.  Today, 12/02/2017 Day # 7 meropenem 2g q12h Day #6 vancomycin 2500mg  x 1 (2/7), then 1750mg  q48h Tmax 100.4 WBC 29.5 SCr 1.6, CrCl ~60 ml/min  Plan: Increase Meropenem to 2g IV q8h since SCr has improved - monitor renal function and adjust dose as needed Increase Vancomycin to 1250mg  IV q24h x 1 more dose today, then discontinue per CCM plan (see progress note)  Height: 5\' 10"  (177.8 cm) Weight: (!) 314 lb 13.1 oz (142.8 kg) IBW/kg (Calculated) : 68.5  Temp (24hrs), Avg:100.1 F (37.8 C), Min:99.7 F (37.6 C), Max:100.7 F (38.2 C)  Recent Labs  Lab 11/26/17 1332  11/28/17 0315 11/29/17 0500 11/29/17 0800 11/29/17 1127 11/30/17 0435 12/01/17 0325 12/01/17 0543 12/02/17 0405  WBC  --    < > 27.1*  --  29.0*  --  30.1* QUESTIONABLE RESULTS, RECOMMEND RECOLLECT TO VERIFY 29.5*  --   CREATININE  --    < > 2.47* 2.33*  --   --  2.22* 1.87* 1.74* 1.60*  LATICACIDVEN 0.8  --   --   --   --   --   --   --   --   --   VANCORANDOM  --   --   --   --   --  13  --   --   --   --    < > = values in this interval not displayed.    Estimated Creatinine Clearance: 61.6 mL/min (A) (by C-G formula based on SCr of 1.6 mg/dL (H)).    Allergies  Allergen Reactions  . Demerol Other (See Comments)    Severe hypotension  . Meperidine     Other reaction(s): Hypotension, Hypotension (ALLERGY/intolerance)  . Other Anaphylaxis    Pineapple  . Pineapple Swelling and Anaphylaxis    Antimicrobials this admission: 1/14 Zosyn >> 1/22 1/22 Meropenem >> 1/28, 2/6 >> 2/5 Fluconazole >> 2/10 2/7 Vancomycin >>   Dose  adjustments this admission: 1/18 adjust zosyn to 3.375g q6h for 30 min infusions with start of CVVHDF 1/23 Meropenem 1g q12h > q8h since CRRT stopped 2/7 change meropenem to 2g q12h for worsening SCr 2/8 adjust fluconazole dose to 200 mg q24h - increased dose from oropharyngeal indication dosing to cover for IAI as well 2/9 RV= 13, 48 hours after 2.5gm dose given 2/11 Adjust to 1250mg  IV q24h x 1 dose   Microbiology results: 1/14 MRSA PCR: negative 1/15 Surgical PCR: neg/neg 1/22 BCx: ng-final 2/5 UCx: Enterococcus faecium (S- ampicillin, levaquin, vanc; R- NTF) 2/6 strep ur ag: neg 2/6 legionella ur ag: neg 2/6 BCx: NGF 2/7 Trach cx: normal flora (final)  Thank you for allowing pharmacy to be a part of this patient's care.  Loralee PacasErin Amara Justen, PharmD, BCPS Pager: [phone] 12/02/2017 7:42 AM

## 2017-12-02 NOTE — Progress Notes (Addendum)
Patient blood pressure decreasing despite trending upward on neo. Patient has had two vagal episodes where heart rate has dropped from 80s into 40s. Patient having intermittent long pauses as well. Patient has started agonal breathing again. CCM MD notified of sitiuation. Orders placed. Will continue to monitor

## 2017-12-02 NOTE — Progress Notes (Signed)
Goals of Care meeting set up with [name] at [facility]3PM to discuss next steps and plan for one way extubation later this week.  Emma MaltaElizabeth Waynette Towers, DO Palliative Medicine

## 2017-12-03 ENCOUNTER — Inpatient Hospital Stay (HOSPITAL_COMMUNITY): Payer: BLUE CROSS/BLUE SHIELD

## 2017-12-03 LAB — BLOOD GAS, ARTERIAL
ACID-BASE DEFICIT: 7.6 mmol/L — AB (ref 0.0–2.0)
Bicarbonate: 20.1 mmol/L (ref 20.0–28.0)
DRAWN BY: 331471
FIO2: 40
LHR: 22 {breaths}/min
MECHVT: 500 mL
O2 SAT: 91.3 %
PCO2 ART: 56.4 mmHg — AB (ref 32.0–48.0)
PEEP: 8 cmH2O
Patient temperature: 37
pH, Arterial: 7.178 — CL (ref 7.350–7.450)
pO2, Arterial: 72.2 mmHg — ABNORMAL LOW (ref 83.0–108.0)

## 2017-12-03 LAB — COMPREHENSIVE METABOLIC PANEL
ALBUMIN: 1.5 g/dL — AB (ref 3.5–5.0)
ALK PHOS: 280 U/L — AB (ref 38–126)
ALT: 71 U/L — AB (ref 14–54)
AST: 91 U/L — AB (ref 15–41)
Anion gap: 9 (ref 5–15)
BILIRUBIN TOTAL: 0.6 mg/dL (ref 0.3–1.2)
BUN: 105 mg/dL — AB (ref 6–20)
CALCIUM: 8.9 mg/dL (ref 8.9–10.3)
CO2: 20 mmol/L — ABNORMAL LOW (ref 22–32)
CREATININE: 1.46 mg/dL — AB (ref 0.44–1.00)
Chloride: 113 mmol/L — ABNORMAL HIGH (ref 101–111)
GFR calc Af Amer: 46 mL/min — ABNORMAL LOW (ref >60)
GFR calc non Af Amer: 39 mL/min — ABNORMAL LOW (ref >60)
GLUCOSE: 152 mg/dL — AB (ref 65–99)
POTASSIUM: 3.9 mmol/L (ref 3.5–5.1)
Sodium: 142 mmol/L (ref 135–145)
TOTAL PROTEIN: 5.7 g/dL — AB (ref 6.5–8.1)

## 2017-12-03 LAB — GLUCOSE, CAPILLARY
GLUCOSE-CAPILLARY: 137 mg/dL — AB (ref 65–99)
Glucose-Capillary: 138 mg/dL — ABNORMAL HIGH (ref 65–99)
Glucose-Capillary: 117 mg/dL — ABNORMAL HIGH (ref 65–99)
Glucose-Capillary: 139 mg/dL — ABNORMAL HIGH (ref 65–99)

## 2017-12-03 LAB — MAGNESIUM: Magnesium: 2 mg/dL (ref 1.7–2.4)

## 2017-12-03 LAB — PHOSPHORUS: Phosphorus: 5.9 mg/dL — ABNORMAL HIGH (ref 2.5–4.6)

## 2017-12-03 MED ORDER — SODIUM CHLORIDE 0.9 % IV SOLN
25.0000 ug/h | INTRAVENOUS | Status: DC
  Administered 2017-12-03 – 2017-12-04 (×2): 175 ug/h via INTRAVENOUS
  Filled 2017-12-03 (×2): qty 50

## 2017-12-03 MED ORDER — FREE WATER
100.0000 mL | Freq: Three times a day (TID) | Status: DC
  Administered 2017-12-03 – 2017-12-05 (×6): 100 mL

## 2017-12-03 MED ORDER — VITAL HIGH PROTEIN PO LIQD
1000.0000 mL | ORAL | Status: DC
  Administered 2017-12-03: 1000 mL
  Filled 2017-12-03 (×3): qty 1000

## 2017-12-03 MED ORDER — TRACE MINERALS CR-CU-MN-SE-ZN 10-1000-500-60 MCG/ML IV SOLN
INTRAVENOUS | Status: AC
  Administered 2017-12-03: 17:00:00 via INTRAVENOUS
  Filled 2017-12-03: qty 1000
  Filled 2017-12-03: qty 2640

## 2017-12-03 NOTE — Progress Notes (Signed)
Palliative Care Progress Note  I met with Edelmira's brother Legrand Como and Emmah's father this morning to update them on her condition and answer their questions- this is the first time her father has seen her and he is appropriately grieving and asking questions. Legrand Como has to also has to care for both his mother and father. They are all overwhelmed and the uncertainty of her condition and how it will evolve is weighing heavy on them. I went over her labs and current medications with them. They continue to want all reasonable and available interventions and medical treatment to be done.   Recommendations:  Plan for "one way extubation" Thursday afternoon is the tentative plan depending on how her condition evolves.  Discussed with CCM- will for prognostic reasons repeat CT scan and consider interventional options again- even as a palliative last resort.  Continue broad spectrum antibiotics.  She is on very high levels of sedation and pain control. She did not tolerate Precedex-due to bradycardia and hypotension so she was switched to versed which she is tolerating.Fentanyl is at 200/hr and Versed is at 22m/hr and she is comfortably unresponsive.For today in preparation for tomorrow I have asked RN to work on reducing Fentanyl infusion and versed to see what lightening her sedation and pain control affects her. Both are fairly rapid on and off. For her extubation I recommend uKoreagradually reducing her vent settings and adjusting her meds incrementally for her comfort.   I have prepared family that she likely will not survive this hospitalization but there is also a chance that with conservative supportive care she could possible begin to improve but that would be a long difficult road. In terms of goals a line was drawn at placing a trach something they would not want given her current decline and overall poor prognosis and high mortality of this critical illness.  She will be DNR after extubation,  reasonable for her to be LIMITED Code presently-would not be appropriate to do CPR in her condition.  Time: 35 minutes Greater than 50%  of this time was spent counseling and coordinating care related to the above assessment and plan.   ELane Hacker DO Palliative Medicine [phone]

## 2017-12-03 NOTE — Progress Notes (Signed)
Asked to evaluate patient for perc drain. There are findings c/w acute pancreatitis but no defined abscess or pseudocyst. We can offer palliative paracentesis..Marland Kitchen

## 2017-12-03 NOTE — Progress Notes (Signed)
PHARMACY - ADULT TOTAL PARENTERAL NUTRITION CONSULT NOTE  Pharmacy Consult for TPN Indication: severe necrotizing pancreatitis  Patient Measurements: Height: '5\' 10"'  (177.8 cm) Weight: (!) 317 lb 7.4 oz (144 kg) IBW/kg (Calculated) : 68.5 TPN AdjBW (KG): 85.3 Body mass index is 45.55 kg/m.  Insulin Requirements:  - 12 units Moderate Novolog SSI / 24hrs - 110 units regular insulin in TPN  Current Nutrition: NPO, TPN at goal  IVF: D5W with 15mq sodium bicarb at 527mhr started 2/11  Central access: R IJ HD catheter TPN start date: 1/24  ASSESSMENT                                                                                                          HPI: [id number]. Female with gallstone pancreatitis with necrosis s/p MRCP on 1/15.  Repeat LFTs and lipase normalizing.  No plans for surgical intervention at this time; planning for delayed cholecystectomy per CCS.  Tube feeding was started but had to be stopped on 1/22 due to nausea/vomiting.  Pharmacy is consulted to begin TPN. Patient's course has been complicated with respiratory failure and intubation and subsequent extubation on 11/09/2017.  She was also on CRRT from 1/18-1/23.  Significant events:  1/28: CL diet 1/29: FL diet 1/30: did not tolerate FLD, back to CLD, weight incr 305 > 314  2/2 Remains volume overloaded, but respiratory status is improved.  Avoiding lasix prior to contrast CT early next week.  Tolerating volume and OK to continue to advance towards goal rate per MD. 2/6 repeat CT scan yesterday reveals interval progression of necrotizing pancreatitis.  CXR shows bilateral pleural effusions and airspace disease. With WBC increase, resuming antibiotics with Meropenem for IAI and possible HCAP. PCCM re-consulted and patient re-intubated. 2/11 Bicarb drip at 5014mr added by CCM for NAG acidosis. Total IVF rate now 160m40m - monitor closely for development of fluid overload and consider decreasing TPN rate if  necessary. 2/13 Considering initiation of trickle feeds per d/w CCM  Today: 12/03/2017  Glucose: No hx DM. CBGs elevated since starting TPN but improved with addition of insulin to TPN (CBG 128-151).  Requiring high amount of insulin in TPN to keep CBGs controlled in setting of pancreatitis.  Electrolytes:   Na had been low but now WNL  K WNL  CorrCa 10.9 withOUT electrolytes in TPN.  Phos-Calcium product 64.31 (goal <55).  Continue with electrolyte-free TPN bag.  Renal: AKI on CRRT 1/18-1/23. SCr down 1.46.  I/O net +2570 mL, still with adequate UOP per recordings yesterday, weight up.  LFTs: AST/ALT trending back up, Alk phos elevated, Tbili normal.  TGs: 213 (1/15), 332 (1/18), 276 (1/28) 224 (2/4), 213 (2/11)  Prealbumin: 6.7 (1/25), 9.3 (1/28), 7.1 (2/4), 10.9 (2/11)  NUTRITIONAL GOALS  RD recs 2/7: 1500-1704 kcal, >/= 136 grams protein per day  With updated nutrition goals post re-intubation, will remove the daily lipids for now to continue to meet high protein needs without significantly over feeding calories.  New goal with Clinimix 5/15 at 159m/hr provides 132g/day protein, 1874Kcal/day.   PLAN                                                                                                                         At 1800 today:  Continue Clinimix 5/15 (NO electrolytes) at goal rate of 110 ml/hr. Spoke with CCM regarding addition of bicarb drip 2/11 for NAG acidosis - may need to back off on TPN rate if patient develops fluid overload. Will assess this daily - ok to keep at current rate today per d/w CCM.  Hold lipid emulsion to meet high protein needs without overfeeding kcal in this critically ill obese patient.  TPN to contain standard multivitamins and trace elements  Continue insulin in TPN, 110 units per 24 hr.   Continue SSI q4h MODERATE scale  IVF management per MD  (currently off).  TPN lab panels on Mondays & Thursdays.   F/u daily.   EPeggyann Juba PharmD, BCPS Pager: [phone]/13/2019, 8:59 AM

## 2017-12-03 NOTE — Progress Notes (Signed)
PULMONARY / CRITICAL CARE MEDICINE   Name: Emma Washington MRN: [id number] DOB: [birth year]    ADMISSION DATE:  11/17/2017 CONSULTATION DATE:  11/26/2017 REFERRING MD:  Dr. Janee Morn, [facility]  CHIEF COMPLAINT:  Progressive respiratory failure   HISTORY OF PRESENT ILLNESS:   57 yo female admitted 10/23/2017 with gallstone pancreatitis and necrotizing pancreatitis.  Hospital stay complicated by respiratory failure from ALI, AKI.  She was extubated 1/20.  Since then she developed recurrent hypoxic/hypercapnic respiratory failure requiring reintubation associated with progression of pancreatitis.  PMHx of HTN, GERD, Anemia, Nephrolithiasis, PCOS.   SUBJECTIVE:  RN reports pt did not tolerate precedex overnight with bradycardia / pauses.  Remains on TPN, fentanyl, versed, neosynephrine at 50 mcg.    VITAL SIGNS: BP (!) 100/49   Pulse 88   Temp 98.1 F (36.7 C) (Axillary)   Resp (!) 22   Ht 5\' 10"  (1.778 m)   Wt (!) 317 lb 7.4 oz (144 kg)   LMP 11/07/2011   SpO2 94%   BMI 45.55 kg/m   VENTILATOR SETTINGS: Vent Mode: PRVC FiO2 (%):  [40 %] 40 % Set Rate:  [22 bmp] 22 bmp Vt Set:  [500 mL] 500 mL PEEP:  [5 cmH20-8 cmH20] 8 cmH20 Plateau Pressure:  [25 cmH20-27 cmH20] 26 cmH20  INTAKE / OUTPUT: I/O last 3 completed shifts: In: 8084.5 [I.V.:7634.5; IV Piggyback:450] Out: 4098 [JXBJY:7829; Emesis/NG output:750]  PHYSICAL EXAMINATION: General: ill appearing female in NAD on vent HEENT: MM pink/dry, ETT  Neuro: sedate  CV: s1s2 rrr, no m/r/g PULM: even/non-labored, lungs bilaterally clear  GI: protuberant abdomen, BSx4 hypoactive Extremities: warm/dry, generalized edema  Skin: no rashes or lesions  LABS:  BMET Recent Labs  Lab 12/01/17 0543 12/02/17 0405 12/03/17 0638  NA 132* 138 142  K 4.0 4.2 3.9  CL 107 111 113*  CO2 16* 17* 20*  BUN 104* 108* 105*  CREATININE 1.74* 1.60* 1.46*  GLUCOSE 132* 131* 152*    Electrolytes Recent Labs  Lab 12/01/17 0543 12/02/17 0405  12/03/17 0638  CALCIUM 9.0 9.2 8.9  MG 2.1 2.1 2.0  PHOS 5.9* 5.6* 5.9*    CBC Recent Labs  Lab 11/30/17 0435 12/01/17 0325 12/01/17 0543  WBC 30.1* QUESTIONABLE RESULTS, RECOMMEND RECOLLECT TO VERIFY 29.5*  HGB 7.4* QUESTIONABLE RESULTS, RECOMMEND RECOLLECT TO VERIFY 7.7*  HCT 22.7* QUESTIONABLE RESULTS, RECOMMEND RECOLLECT TO VERIFY 24.0*  PLT 527* QUESTIONABLE RESULTS, RECOMMEND RECOLLECT TO VERIFY 569*    Coag's Recent Labs  Lab 11/27/17 0310  INR 1.24    Sepsis Markers Recent Labs  Lab 11/26/17 1332  LATICACIDVEN 0.8    ABG Recent Labs  Lab 11/26/17 1301 11/27/17 0945 12/01/17 1058  PHART 7.196* 7.223* 7.200*  PCO2ART 55.3* 44.7 43.0  PO2ART 68.4* 91.7 76.0*    Liver Enzymes Recent Labs  Lab 12/01/17 0543 12/02/17 0405 12/03/17 0638  AST 46* 49* 91*  ALT 40 39 71*  ALKPHOS 269* 254* 280*  BILITOT 0.3 0.4 0.6  ALBUMIN 1.6* 1.6* 1.5*    Cardiac Enzymes No results for input(s): TROPONINI, PROBNP in the last 168 hours.  Glucose Recent Labs  Lab 12/02/17 0752 12/02/17 1133 12/02/17 1921 12/02/17 2317 12/03/17 0322 12/03/17 0753  GLUCAP 142* 154* 118* 121* 138* 137*    Imaging No results found.   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 Course complicated by AKI, malnutrition and enterococcal UTI.  Concern for overall poor prognosis. Family considering possible comfort extubation 2/14 but remain open to any possible therapy that would help her get better.  They would not want trach.    ASSESSMENT / PLAN:  Necrotizing pancreatitis with  pseudocyst in setting of gallstone pancreatitis. - appreciate help from GI and CCS - case d/w [facility] on 2/07 >> not a candidate for endoscopic drainage - not a surgical candidate P: Trickle TF  Continue TF  Meropenem as above  Repeat abd imaging 2/13   Acute hypoxic/hypercapnic respiratory failure.  Multifactorial in the setting of decreased abdominal compliance, atelectasis, pain/narcotic requirements and new sepsis P: PRVC 8 cc/kg  RASS Goal: -1  Likely one way extubation 2/14 pending results of abd imaging   Enterococcus UTI. Thrush (completed course of diflucan) P: Meropenem as above   Acute metabolic encephalopathy. P: Supportive care Minimize sedation if possible  Did not tolerate precedex > brady / pauses   Acute renal failure P: Trend BMP / urinary output Free water 100 ml Q8  Renal dose medications Replace electrolytes as indicated Avoid nephrotoxic agents, ensure adequate renal perfusion  Metabolic acidosis (NAG)-->this is likely contributing to her vent mechanics.  P: Bicarb gtt at 50 ml/hr   Anemia of critical illness- no evidence for bleeding P: Trend CBC  Transfuse per ICU guidelines  Hyperglycemia. P: SSI   DVT prophylaxis - SQ heparin SUP - protonix Nutrition - TPN Goals of care - Full code.  Palliative care following, appreciate input.     Canary BrimBrandi Ollis, NP-C [facility] Pgr: [phone] or if no answer [phone] 12/03/2017, 10:20 AM

## 2017-12-03 NOTE — Progress Notes (Signed)
Family Meeting Goals of Care  I met with Jyllian, Haynie designated healthcare surrogate to discuss goals of care. He has multiple questions for me about her condition and possible options for treatment intervention.I updated him on medical issues. I addressed his decision making burden and anxiety as well as the uncertainty of her trajectory- I reassured him we would keep her both comfortable and provide all medical interventions that would help her.  Summary:  1. After discussion with CCM probably no advantage to continue ETT past this week if trach not an option- will extubate (one-way) Thursday afternoon.   2. Legrand Como plans to bring his father in tomorrow AM and bring his mom on Thursday AM.  3. I completed FMLA paperwork for him.  4. I prepared him for three possible/different trajectories: 1. She may die quickly after extubation 2. She may hold her on for a few days at which time she will declare herself as improving or actively dying.  5. Until a decision to shift towards full comfort care is made and outcome is clear they want all treatments and supportive care that is reasonable to continue.  Overall goal is to prevent Ecko from suffering- especially if it becomes clear that this is not getting better. They also want her to be sedate if possible on the ventilator.  Our team will continue to support and follow. I am going to ask chaplain to stop by and offer additional support to this patient and family during this difficult time.  Lane Hacker, DO Palliative Medicine   Time: 35 min Greater than 50%  of this time was spent counseling and coordinating care related to the above assessment and plan.

## 2017-12-04 ENCOUNTER — Inpatient Hospital Stay (HOSPITAL_COMMUNITY): Payer: BLUE CROSS/BLUE SHIELD

## 2017-12-04 LAB — GLUCOSE, CAPILLARY
GLUCOSE-CAPILLARY: 146 mg/dL — AB (ref 65–99)
GLUCOSE-CAPILLARY: 123 mg/dL — AB (ref 65–99)
GLUCOSE-CAPILLARY: 156 mg/dL — AB (ref 65–99)
Glucose-Capillary: 151 mg/dL — ABNORMAL HIGH (ref 65–99)
Glucose-Capillary: 136 mg/dL — ABNORMAL HIGH (ref 65–99)
Glucose-Capillary: 133 mg/dL — ABNORMAL HIGH (ref 65–99)
Glucose-Capillary: 170 mg/dL — ABNORMAL HIGH (ref 65–99)
Glucose-Capillary: 157 mg/dL — ABNORMAL HIGH (ref 65–99)
Glucose-Capillary: 150 mg/dL — ABNORMAL HIGH (ref 65–99)

## 2017-12-04 LAB — MAGNESIUM: Magnesium: 1.9 mg/dL (ref 1.7–2.4)

## 2017-12-04 LAB — COMPREHENSIVE METABOLIC PANEL
ALBUMIN: 1.6 g/dL — AB (ref 3.5–5.0)
ALK PHOS: 323 U/L — AB (ref 38–126)
ALT: 100 U/L — ABNORMAL HIGH (ref 14–54)
ANION GAP: 11 (ref 5–15)
AST: 108 U/L — ABNORMAL HIGH (ref 15–41)
BILIRUBIN TOTAL: 0.5 mg/dL (ref 0.3–1.2)
BUN: 99 mg/dL — ABNORMAL HIGH (ref 6–20)
CALCIUM: 8.6 mg/dL — AB (ref 8.9–10.3)
CO2: 19 mmol/L — ABNORMAL LOW (ref 22–32)
CREATININE: 1.3 mg/dL — AB (ref 0.44–1.00)
Chloride: 114 mmol/L — ABNORMAL HIGH (ref 101–111)
GFR calc Af Amer: 53 mL/min — ABNORMAL LOW (ref >60)
GFR calc non Af Amer: 45 mL/min — ABNORMAL LOW (ref >60)
Glucose, Bld: 148 mg/dL — ABNORMAL HIGH (ref 65–99)
Potassium: 3.5 mmol/L (ref 3.5–5.1)
Sodium: 144 mmol/L (ref 135–145)
TOTAL PROTEIN: 5.8 g/dL — AB (ref 6.5–8.1)

## 2017-12-04 LAB — BODY FLUID CELL COUNT WITH DIFFERENTIAL
Eos, Fluid: 2 %
LYMPHS FL: 66 %
MONOCYTE-MACROPHAGE-SEROUS FLUID: 18 % — AB (ref 50–90)
Neutrophil Count, Fluid: 14 % (ref 0–25)
WBC FLUID: 399 uL (ref 0–1000)

## 2017-12-04 LAB — BLOOD GAS, ARTERIAL
ACID-BASE DEFICIT: 4.6 mmol/L — AB (ref 0.0–2.0)
BICARBONATE: 21.8 mmol/L (ref 20.0–28.0)
FIO2: 40
O2 SAT: 91.4 %
PATIENT TEMPERATURE: 98.6
PCO2 ART: 49 mmHg — AB (ref 32.0–48.0)
PEEP/CPAP: 8 cmH2O
PH ART: 7.27 — AB (ref 7.350–7.450)
RATE: 26 resp/min
VT: 500 mL
pO2, Arterial: 70.1 mmHg — ABNORMAL LOW (ref 83.0–108.0)

## 2017-12-04 LAB — GRAM STAIN

## 2017-12-04 LAB — CBC
HCT: 24.5 % — ABNORMAL LOW (ref 36.0–46.0)
HEMOGLOBIN: 7.9 g/dL — AB (ref 12.0–15.0)
MCH: 30.3 pg (ref 26.0–34.0)
MCHC: 32.2 g/dL (ref 30.0–36.0)
MCV: 93.9 fL (ref 78.0–100.0)
PLATELETS: 615 10*3/uL — AB (ref 150–400)
RBC: 2.61 MIL/uL — ABNORMAL LOW (ref 3.87–5.11)
RDW: 17.1 % — AB (ref 11.5–15.5)
WBC: 26.1 10*3/uL — ABNORMAL HIGH (ref 4.0–10.5)

## 2017-12-04 LAB — PHOSPHORUS: Phosphorus: 4.9 mg/dL — ABNORMAL HIGH (ref 2.5–4.6)

## 2017-12-04 MED ORDER — LIDOCAINE HCL 1 % IJ SOLN
INTRAMUSCULAR | Status: AC
  Filled 2017-12-04: qty 20

## 2017-12-04 MED ORDER — PRO-STAT SUGAR FREE PO LIQD
30.0000 mL | Freq: Two times a day (BID) | ORAL | Status: DC
  Administered 2017-12-04 – 2017-12-05 (×2): 30 mL
  Filled 2017-12-04 (×3): qty 30

## 2017-12-04 MED ORDER — POTASSIUM CHLORIDE 20 MEQ/15ML (10%) PO SOLN
40.0000 meq | Freq: Once | ORAL | Status: AC
  Administered 2017-12-04: 40 meq
  Filled 2017-12-04: qty 30

## 2017-12-04 MED ORDER — TRACE MINERALS CR-CU-MN-SE-ZN 10-1000-500-60 MCG/ML IV SOLN
INTRAVENOUS | Status: DC
  Administered 2017-12-04: 17:00:00 via INTRAVENOUS
  Filled 2017-12-04: qty 1992

## 2017-12-04 MED ORDER — DIPHENHYDRAMINE HCL 50 MG/ML IJ SOLN
25.0000 mg | Freq: Four times a day (QID) | INTRAMUSCULAR | Status: DC
  Administered 2017-12-04 – 2017-12-05 (×3): 25 mg via INTRAVENOUS
  Filled 2017-12-04 (×3): qty 1

## 2017-12-04 MED ORDER — MAGIC MOUTHWASH
10.0000 mL | Freq: Three times a day (TID) | ORAL | Status: DC
  Administered 2017-12-04 – 2017-12-05 (×3): 10 mL via ORAL
  Filled 2017-12-04 (×3): qty 10

## 2017-12-04 MED ORDER — FENTANYL 2500MCG IN NS 250ML (10MCG/ML) PREMIX INFUSION
25.0000 ug/h | INTRAVENOUS | Status: DC
  Administered 2017-12-04 – 2017-12-05 (×2): 175 ug/h via INTRAVENOUS
  Filled 2017-12-04 (×2): qty 250

## 2017-12-04 NOTE — Progress Notes (Signed)
PULMONARY / CRITICAL CARE MEDICINE   Name: Emma MantisJoyce Bobe MRN: [id number] DOB: [birth year]    ADMISSION DATE:  August 01, 2018 CONSULTATION DATE:  11/26/2017 REFERRING MD:  Dr. Janee Mornhompson, [facility]  CHIEF COMPLAINT:  Progressive respiratory failure   HISTORY OF PRESENT ILLNESS:   57 yo female admitted 09-25-2018 with gallstone pancreatitis and necrotizing pancreatitis.  Hospital stay complicated by respiratory failure from ALI, AKI.  She was extubated 1/20.  Since then she developed recurrent hypoxic/hypercapnic respiratory failure requiring reintubation associated with progression of pancreatitis.  PMHx of HTN, GERD, Anemia, Nephrolithiasis, PCOS.   SUBJECTIVE:  RN reports pt remains on 175 mcg's fentanyl, 2mg  versed, 50 neosynephrine.  No changes in sedation overnight.  Pt pending palliative paracentesis between 11-2pm today.      VITAL SIGNS: BP (!) 110/46 (BP Location: Left Arm)   Pulse 89   Temp 99.8 F (37.7 C) (Oral)   Resp (!) 27   Ht 5\' 10"  (1.778 m)   Wt (!) 320 lb 1.7 oz (145.2 kg)   LMP 11/07/2011   SpO2 97%   BMI 45.93 kg/m   VENTILATOR SETTINGS: Vent Mode: PRVC FiO2 (%):  [40 %] 40 % Set Rate:  [22 bmp-26 bmp] 26 bmp Vt Set:  [50 mL-500 mL] 500 mL PEEP:  [8 cmH20] 8 cmH20 Plateau Pressure:  [23 cmH20-27 cmH20] 26 cmH20  INTAKE / OUTPUT: I/O last 3 completed shifts: In: 9386 [I.V.:9094.7; NG/GT:91.3; IV Piggyback:200] Out: 4925 [Urine:4575; Emesis/NG output:350]  PHYSICAL EXAMINATION: General:  Adult female in NAD on vent, sedate HEENT: MM pink/moist, ETT Neuro: AAOx4, speech clear, MAE  CV: s1s2 rrr, no m/r/g PULM: even/non-labored, lungs bilaterally clear GI: protuberant abdomen, tight, hypoactive BS, TF infusing Extremities: warm/dry, generalized edema  Skin: no rashes or lesions  LABS:  BMET Recent Labs  Lab 12/02/17 0405 12/03/17 0638 12/04/17 0323  NA 138 142 144  K 4.2 3.9 3.5  CL 111 113* 114*  CO2 17* 20* 19*  BUN 108* 105* 99*  CREATININE 1.60*  1.46* 1.30*  GLUCOSE 131* 152* 148*    Electrolytes Recent Labs  Lab 12/02/17 0405 12/03/17 0638 12/04/17 0323  CALCIUM 9.2 8.9 8.6*  MG 2.1 2.0 1.9  PHOS 5.6* 5.9* 4.9*    CBC Recent Labs  Lab 12/01/17 0325 12/01/17 0543 12/04/17 0323  WBC QUESTIONABLE RESULTS, RECOMMEND RECOLLECT TO VERIFY 29.5* 26.1*  HGB QUESTIONABLE RESULTS, RECOMMEND RECOLLECT TO VERIFY 7.7* 7.9*  HCT QUESTIONABLE RESULTS, RECOMMEND RECOLLECT TO VERIFY 24.0* 24.5*  PLT QUESTIONABLE RESULTS, RECOMMEND RECOLLECT TO VERIFY 569* 615*    Coag's No results for input(s): APTT, INR in the last 168 hours.  Sepsis Markers No results for input(s): LATICACIDVEN, PROCALCITON, O2SATVEN in the last 168 hours.  ABG Recent Labs  Lab 11/27/17 0945 12/01/17 1058 12/03/17 1415  PHART 7.223* 7.200* 7.178*  PCO2ART 44.7 43.0 56.4*  PO2ART 91.7 76.0* 72.2*    Liver Enzymes Recent Labs  Lab 12/02/17 0405 12/03/17 0638 12/04/17 0323  AST 49* 91* 108*  ALT 39 71* 100*  ALKPHOS 254* 280* 323*  BILITOT 0.4 0.6 0.5  ALBUMIN 1.6* 1.5* 1.6*    Cardiac Enzymes No results for input(s): TROPONINI, PROBNP in the last 168 hours.  Glucose Recent Labs  Lab 12/03/17 0322 12/03/17 0753 12/03/17 1113 12/03/17 2312 12/04/17 0314 12/04/17 0737  GLUCAP 138* 137* 117* 139* 123* 133*    Imaging Ct Abdomen Pelvis Wo Contrast  Result Date: 12/03/2017 CLINICAL DATA:  Inpatient. Necrotizing gallstone pancreatitis. Clinical deterioration. Respiratory failure. Acute kidney injury.  EXAM: CT ABDOMEN AND PELVIS WITHOUT CONTRAST TECHNIQUE: Multidetector CT imaging of the abdomen and pelvis was performed following the standard protocol without IV contrast. COMPARISON:  11/28/2017 abdominal radiograph. 11/25/2017 CT abdomen/pelvis. FINDINGS: Abdominal images are degraded by streak artifact from the patient's upper extremities. Lower chest: Small dependent bilateral pleural effusions, right greater than left, increased on the  right and decreased on the left since 11/25/2017 CT. Complete right lower lobe and moderate left lower lobe atelectasis. Tip of superior approach central venous catheter is seen at the cavoatrial junction. Stable nodular fat stranding in the left pericardiophrenic region. Hepatobiliary: Normal liver size. No liver mass. Layering sludge versus noncalcified stones in gallbladder with no gallbladder wall thickening. No biliary ductal dilatation. CBD diameter approximately 3 mm. Pancreas: There is persistent thickening and non-enhancement of entire pancreatic neck, pancreatic body and pancreatic tail with extensive peripancreatic fluid extending into lesser sac and bilateral anterior paranephric retroperitoneum, compatible with severe necrotizing pancreatitis. No pancreatic parenchymal gas. The midline retroperitoneal/mesenteric root fluid collection measures 11.6 x 8.7 cm (series 2/image 60), previously 11.6 x 8.7 cm using similar measurement technique on 11/25/2017 CT, stable. The lesser sac 12.7 x 10.7 cm fluid collection (series 2/image 20) is increased from 11.2 x 7.1 cm. The left retroperitoneal 13.4 x 11.4 cm collection (series 2/image 44) is mildly increased from 12.8 x 10.0 cm using similar measurement technique. No evidence of pancreatic duct dilation. Spleen: Normal size. No mass. Adrenals/Urinary Tract: Normal adrenals. No renal stones. No hydronephrosis. No contour deforming renal mass. Bladder collapsed by indwelling Foley catheter. Bladder appears grossly normal. Stomach/Bowel: Enteric tube terminates in the body of the stomach. Nondistended stomach appears grossly normal. Normal caliber small bowel with no small bowel wall thickening. Normal appendix. Normal large bowel with no diverticulosis, large bowel wall thickening or pericolonic fat stranding. Vascular/Lymphatic: Normal caliber abdominal aorta. Stable mild mesenteric adenopathy, for example a 1.8 cm right mesenteric node (series 2/image 68). No  pathologically enlarged pelvic nodes. Reproductive: Stable mildly enlarged myomatous uterus. Symmetric round soft tissue in the adnexal regions is stable and presumably represents mildly prominent ovarian tissue. Other: Moderate volume ascites is slightly increased. No pneumoperitoneum. There is extensive nodular soft tissue stranding throughout the omentum, not appreciably changed, which was not present on 11/10/2017 CT. Increased anasarca. Musculoskeletal: No aggressive appearing focal osseous lesions. Mild thoracolumbar spondylosis. IMPRESSION: 1. Persistent findings of severe necrotizing pancreatitis. Large peripancreatic fluid collections in the lesser sac, mesenteric root and anterior left retroperitoneum are stable to mildly increased, as detailed. 2. Moderate ascites, slightly increased. Small dependent bilateral pleural effusions, right greater than left, increased on the right and decreased on the left. Increased anasarca. 3. Stable extensive nodular soft tissue stranding throughout the omentum, presumably representing inflammatory nodular peritonitis, as this finding was not present on 11/15/2017 CT. 4. Stable mild mesenteric adenopathy, nonspecific, probably reactive. 5. Layering sludge versus noncalcified stones in the gallbladder. No biliary ductal dilatation. Electronically Signed   By: Delbert Phenix M.D.   On: 12/03/2017 12:55   Dg Chest Port 1 View  Result Date: 12/04/2017 CLINICAL DATA:  Respiratory failure.  ETT. EXAM: PORTABLE CHEST 1 VIEW COMPARISON:  11/30/2017. FINDINGS: Rotated radiograph is suboptimal. Cardiomegaly. ET tube 3 cm above carina. PICC line tip cavoatrial junction. BILATERAL pulmonary opacities, likely pulmonary edema. Worsening aeration from priors. IMPRESSION: Suboptimal rotated radiograph demonstrates ET tube 3 cm above carina. Worsening aeration consistent with pulmonary edema. Electronically Signed   By: Elsie Stain M.D.   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 Course complicated by AKI, malnutrition and enterococcal UTI.  Concern for overall poor prognosis. Family considering possible comfort extubation 2/14 but remain open to any possible therapy that would help her get better.  Family / patient would not want trach.    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 - appreciate help from GI and CCS - case d/w [facility] on 2/07 >> not a candidate for endoscopic drainage - not a surgical candidate P: Continue trickle TF  Meropenem as above  Palliative paracentesis planned for 2/14 Send cultures from asitic fluid   Acute hypoxic/hypercapnic respiratory failure.  Multifactorial in the setting of decreased abdominal compliance, atelectasis, pain/narcotic requirements and new sepsis P: PRVC 8 cc/kg  Respiratory rate increased to 26 Follow up ABG  RASS Goal: -1  Likely one way extubation 2/14 after paracentesis  Supportive care   Enterococcus UTI. Thrush (completed course of diflucan) P: ABX as above  Acute metabolic encephalopathy. P: Supportive care  Minimize sedation as able  Did not tolerate precedex > brady / pauses   Acute renal failure P: Free water 100 ml Q8 Trend BMP / urinary output Replace electrolytes as indicated Avoid nephrotoxic agents, ensure adequate renal perfusion  Metabolic acidosis (NAG)-->this is likely contributing to her  vent mechanics.  P: Increase Bicarb gtt to 100 ml/hr   Anemia of critical illness- no evidence for bleeding P: Trend CBC  Monitor for bleeding   Hyperglycemia. P: SSI  DVT prophylaxis - SQ heparin SUP - protonix Nutrition - TPN, Trickle TF Goals of care - Full code.  Palliative care following, appreciate input.  Discussed plan of care with brother Kathlene November).  He is aware that the paracentesis is both palliative and will also give [place] answers regarding possible bacterial infection.  Possible palliative extubation this afternoon.     [name], NP-C [facility] Pgr: [phone] or if no answer [phone] 12/04/2017, 8:32 AM

## 2017-12-04 NOTE — Progress Notes (Addendum)
PHARMACY - ADULT TOTAL PARENTERAL NUTRITION CONSULT NOTE  Pharmacy Consult for TPN Indication: severe necrotizing pancreatitis  Patient Measurements: Height: '5\' 10"'  (177.8 cm) Weight: (!) 320 lb 1.7 oz (145.2 kg) IBW/kg (Calculated) : 68.5 TPN AdjBW (KG): 85.3 Body mass index is 45.93 kg/m.  Insulin Requirements:  - 10 units Moderate Novolog SSI / 24hrs - 110 units regular insulin in TPN  Current Nutrition: NPO, TPN at goal  IVF: D5W with 120mq sodium bicarb at 554mhr started 2/11, increasing rate 10061mr 2/14  Central access: R IJ HD catheter TPN start date: 1/24  ASSESSMENT                                                                                                          HPI: [id number]. Female with gallstone pancreatitis with necrosis s/p MRCP on 1/15.  Repeat LFTs and lipase normalizing.  No plans for surgical intervention at this time; planning for delayed cholecystectomy per CCS.  Tube feeding was started but had to be stopped on 1/22 due to nausea/vomiting.  Pharmacy is consulted to begin TPN. Patient's course has been complicated with respiratory failure and intubation and subsequent extubation on 11/09/2017.  She was also on CRRT from 1/18-1/23.  Significant events:  1/28: CL diet 1/29: FL diet 1/30: did not tolerate FLD, back to CLD, weight incr 305 > 314  2/2 Remains volume overloaded, but respiratory status is improved.  Avoiding lasix prior to contrast CT early next week.  Tolerating volume and OK to continue to advance towards goal rate per MD. 2/6 repeat CT scan yesterday reveals interval progression of necrotizing pancreatitis.  CXR shows bilateral pleural effusions and airspace disease. With WBC increase, resuming antibiotics with Meropenem for IAI and possible HCAP. PCCM re-consulted and patient re-intubated. 2/11 Bicarb drip at 24m27m added by CCM for NAG acidosis. Total IVF rate now 160ml56m- monitor closely for development of fluid overload and consider  decreasing TPN rate if necessary. 2/13 Trickle feeds initiated with Vital High Protein at 20ml/57mToday: 12/04/2017  Glucose: No hx DM. CBGs elevated since starting TPN but improved with addition of insulin to TPN (CBG 128-151).  Requiring high amount of insulin in TPN to keep CBGs controlled in setting of pancreatitis.  Electrolytes:   Na had been low but now WNL  K at low end of goal  CorrCa 10.52 withOUT electrolytes in TPN.  Phos-Calcium product 62 (goal <55).  Continue with electrolyte-free TPN bag.  Renal: AKI on CRRT 1/18-1/23. SCr down 1.3.  I/O net +3227 mL, still with adequate UOP per recordings yesterday, weight up.  LFTs: AST/ALT trending back up, Alk phos elevated and rising, Tbili normal.  TGs: 213 (1/15), 332 (1/18), 276 (1/28) 224 (2/4), 213 (2/11)  Prealbumin: 6.7 (1/25), 9.3 (1/28), 7.1 (2/4), 10.9 (2/11)  NUTRITIONAL GOALS  RD recs 2/7: 1500-1704 kcal, >/= 136 grams protein per day  With updated nutrition goals post re-intubation, will remove the daily lipids for now to continue to meet high protein needs without significantly over feeding calories.  New goal with Clinimix 5/15 at 120m/hr provides 132g/day protein, 1874Kcal/day.   Vital High Protein at 246mhr providing 42 grams protein and 480kcal per day, therefore will adjust TPN rate to compensate.  PLAN                                                                                                                         Now: KCl 4060mvia tube x 1  At 1800 today:  Decrease Clinimix 5/15 (NO electrolytes) to 83 ml/hr. This along with TF will provide a total of 142 grams protein, 1894kcal per day.  Hold lipid emulsion to meet high protein needs without overfeeding kcal in this critically ill obese patient.  TPN to contain standard multivitamins and trace elements  Decrease insulin in TPN, 83 units per 24 hr to  adjust for new daily TPN volume.   Continue SSI q4h MODERATE scale  IVF management per MD - increasing bicarb drip to 100m3m  TPN lab panels on Mondays & Thursdays.   F/u daily.   ErinPeggyann JubaarmD, BCPS Pager: [phone], 7:34 AM

## 2017-12-04 NOTE — Progress Notes (Signed)
Nutrition Follow-up  DOCUMENTATION CODES:   Morbid obesity  INTERVENTION:  - Continue TPN per Pharmacy.  - Will monitor following planned procedures later today.   NUTRITION DIAGNOSIS:   Inadequate oral intake related to inability to eat as evidenced by NPO status. 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 Other associated symptoms include nausea, vomiting and decreased PO intake (since 1/13). Pertinent PMHx significant for nephrolithiasis.   Significant Events: 1/15- MRCP 1/18- intubated and plan for OGT placement and trickle TF  1/19- CRRT started 1/20- extubated and OGT removed 1/21- Panda placed in R nare 1/22- TF stopped d/t N/V 1/23- plan to hold CRRT x1-2 days and assess for renal recovery 1/24- TPN initiation; ILE to be held x7 days 1/25: Pt pulled NGT, refused replacement 1/30: CLD order placed 2/2: Advancement in TPN rate from 60 mL/hr to 83 mL/hr 2/3 Advancement in TPN rate to goal rate of 110 mL/hr. 2/6: re-intubated and OGT placed 2/13: trickle TF started via OGT  Consult received. Palliative Care following and last met with pt's father and brother yesterday. Reviewed notes from Dr. Hilma Favors and from [place], [place] NP, in detail.  Spoke with RN who reports plan for palliative paracentesis and then one-way extubation this afternoon.  Order placed yesterday for Vital High Protein @ 20 mL/hr with 100 mL free water TID. This regimen provides 480 kcal, 42 grams of protein, and 701 mL free water. RN reports request was made this AM for TF to be placed on hold/turned off. Order in place for  Clinimix 5/15 @ 83 mL/hr which provides 100 grams of protein and 1414 kcal.  Patient is currently intubated on ventilator support MV: 13.6 L/min Temp (24hrs), Avg:99.9 F (37.7 C), Min:99 F (37.2 C), Max:100.6 F (38.1 C)   Medications reviewed; sliding scale Novolog, 40 mg IV Protonix BID, 40 mEq KCl per OGT x1 dose today.  Labs reviewed; CBGs: 123 and 133 mg/dL this AM, Cl: 114 mmol/L, BUN: 99 mg/dL, creatinine: 1.3 mg/dL, Ca: 8.6 mg/dL, Phos: 4.9 mg/dL, Alk Phos and LFTs elevated and trending up, triglycerides: 148 mg/dL this AM.  IVF: D5-150 mEq sodium bicarb @ 100 mL/hr (408 kcal).  Drips: Fentanyl @ 175 mcg/hr, Neo @ 20 mcg/min.      Diet Order:  TPN (CLINIMIX) Adult without lytes TPN (CLINIMIX) Adult without lytes  EDUCATION NEEDS:   No education needs have been identified at this time  Skin:  Skin Assessment: Reviewed RN Assessment  Last BM:  2/6; smear on 2/10  Height:   Ht Readings from Last 1 Encounters:  11/19/17 '5\' 10"'$  (1.778 m)    Weight:   Wt Readings from Last 1 Encounters:  12/04/17 (!) 320 lb 1.7 oz (145.2 kg)    Ideal Body Weight:  65.9 kg  BMI:  Body mass index is 45.93 kg/m.  Estimated Nutritional Needs:   Kcal:  1500-1704 kcal/kg (22-25 kcal/kg IBW)  Protein:  >/= 136 grams (2 grams/kg IBW)  Fluid:  >/= 1.8 L/day      Jarome Matin, MS, RD, LDN, CNSC Inpatient Clinical Dietitian Pager # [phone] After hours/weekend pager #  [phone]

## 2017-12-04 NOTE — Progress Notes (Signed)
Palliative Care  I met with her brother Legrand Como at bedside to discuss goals of care and next steps.  She has a rash- appears reticular- probably manifestation of pancreatitis-family reports she was having this intermittently even priopr to this admission.She appears to be having itching.  Appreciate IR successfully doing paracentesis- I am sure this will help her comfort. Her abdomen appears visibly reduced and is less tense.  1. Benedryl scheduled 2. Her Albumin is very low=- no reason to not try to start TF and use a protein supplement. 3. Versed is off-Fentanmyl is at 175 and she looks comfortable.  Will plan on extubation tomorrow AM.  Lane Hacker, DO Palliative Medicine [phone]  Time: 35 min Greater than 50%  of this time was spent counseling and coordinating care related to the above assessment and plan.

## 2017-12-04 NOTE — Procedures (Signed)
Ultrasound-guided diagnostic and therapeutic paracentesis performed yielding 3.8 liters of clear, yellow fluid. No immediate complications.  A portion of the fluid was submitted to the lab for preordered studies.

## 2017-12-05 DIAGNOSIS — G9341 Metabolic encephalopathy: Secondary | ICD-10-CM

## 2017-12-05 LAB — CBC
HCT: 23.5 % — ABNORMAL LOW (ref 36.0–46.0)
Hemoglobin: 7.5 g/dL — ABNORMAL LOW (ref 12.0–15.0)
MCH: 30.2 pg (ref 26.0–34.0)
MCHC: 31.9 g/dL (ref 30.0–36.0)
MCV: 94.8 fL (ref 78.0–100.0)
PLATELETS: 513 10*3/uL — AB (ref 150–400)
RBC: 2.48 MIL/uL — ABNORMAL LOW (ref 3.87–5.11)
RDW: 17.2 % — AB (ref 11.5–15.5)
WBC: 19.4 10*3/uL — AB (ref 4.0–10.5)

## 2017-12-05 LAB — COMPREHENSIVE METABOLIC PANEL
ALT: 81 U/L — ABNORMAL HIGH (ref 14–54)
ANION GAP: 9 (ref 5–15)
AST: 74 U/L — AB (ref 15–41)
Albumin: 1.4 g/dL — ABNORMAL LOW (ref 3.5–5.0)
Alkaline Phosphatase: 320 U/L — ABNORMAL HIGH (ref 38–126)
BILIRUBIN TOTAL: 0.3 mg/dL (ref 0.3–1.2)
BUN: 93 mg/dL — ABNORMAL HIGH (ref 6–20)
CO2: 22 mmol/L (ref 22–32)
Calcium: 8.3 mg/dL — ABNORMAL LOW (ref 8.9–10.3)
Chloride: 112 mmol/L — ABNORMAL HIGH (ref 101–111)
Creatinine, Ser: 1.2 mg/dL — ABNORMAL HIGH (ref 0.44–1.00)
GFR calc Af Amer: 58 mL/min — ABNORMAL LOW (ref >60)
GFR, EST NON AFRICAN AMERICAN: 50 mL/min — AB (ref >60)
Glucose, Bld: 161 mg/dL — ABNORMAL HIGH (ref 65–99)
POTASSIUM: 3.6 mmol/L (ref 3.5–5.1)
Sodium: 143 mmol/L (ref 135–145)
TOTAL PROTEIN: 5.3 g/dL — AB (ref 6.5–8.1)

## 2017-12-05 LAB — PATHOLOGIST SMEAR REVIEW

## 2017-12-05 LAB — PHOSPHORUS: Phosphorus: 4.7 mg/dL — ABNORMAL HIGH (ref 2.5–4.6)

## 2017-12-05 LAB — GLUCOSE, CAPILLARY
GLUCOSE-CAPILLARY: 140 mg/dL — AB (ref 65–99)
GLUCOSE-CAPILLARY: 149 mg/dL — AB (ref 65–99)

## 2017-12-05 LAB — MAGNESIUM: MAGNESIUM: 1.8 mg/dL (ref 1.7–2.4)

## 2017-12-05 MED ORDER — MAGNESIUM SULFATE IN D5W 1-5 GM/100ML-% IV SOLN
1.0000 g | Freq: Once | INTRAVENOUS | Status: AC
  Administered 2017-12-05: 1 g via INTRAVENOUS
  Filled 2017-12-05: qty 100

## 2017-12-05 MED ORDER — LORAZEPAM 2 MG/ML IJ SOLN
1.0000 mg | INTRAMUSCULAR | Status: AC
  Administered 2017-12-05: 1 mg via INTRAVENOUS

## 2017-12-05 MED ORDER — PROPOFOL 1000 MG/100ML IV EMUL
INTRAVENOUS | Status: AC
  Filled 2017-12-05: qty 100

## 2017-12-05 MED ORDER — MORPHINE BOLUS VIA INFUSION
4.0000 mg | INTRAVENOUS | Status: DC | PRN
Start: 2017-12-05 — End: 2017-12-05
  Administered 2017-12-05: 4 mg via INTRAVENOUS
  Filled 2017-12-05: qty 4

## 2017-12-05 MED ORDER — MORPHINE SULFATE 25 MG/ML IV SOLN
1.0000 mg/h | INTRAVENOUS | Status: DC
  Administered 2017-12-05: 2 mg/h via INTRAVENOUS
  Filled 2017-12-05: qty 10

## 2017-12-05 MED ORDER — POTASSIUM CHLORIDE 20 MEQ/15ML (10%) PO SOLN: 40.0000 meq | Freq: Once | ORAL | Status: DC

## 2017-12-05 MED ORDER — POTASSIUM CHLORIDE 20 MEQ/15ML (10%) PO SOLN
40.0000 meq | Freq: Once | ORAL | Status: AC
  Administered 2017-12-05: 40 meq
  Filled 2017-12-05: qty 30

## 2017-12-05 MED ORDER — PROPOFOL 1000 MG/100ML IV EMUL
5.0000 ug/kg/min | INTRAVENOUS | Status: DC
  Administered 2017-12-05: 5 ug/kg/min via INTRAVENOUS

## 2017-12-05 MED ORDER — MORPHINE SULFATE (PF) 4 MG/ML IV SOLN
4.0000 mg | INTRAVENOUS | Status: AC
  Administered 2017-12-05: 4 mg via INTRAVENOUS

## 2017-12-05 MED ORDER — LORAZEPAM 2 MG/ML IJ SOLN
1.0000 mg | INTRAMUSCULAR | Status: DC | PRN
  Administered 2017-12-05 (×2): 1 mg via INTRAVENOUS
  Filled 2017-12-05: qty 1

## 2017-12-05 MED ORDER — MORPHINE SULFATE (PF) 4 MG/ML IV SOLN
INTRAVENOUS | Status: AC
  Administered 2017-12-05: 4 mg via INTRAVENOUS
  Filled 2017-12-05: qty 1

## 2017-12-05 MED FILL — Medication: Qty: 1 | Status: AC

## 2017-12-09 LAB — CULTURE, BODY FLUID-BOTTLE: CULTURE: NO GROWTH

## 2017-12-12 ENCOUNTER — Telehealth: Payer: Self-pay

## 2017-12-12 NOTE — Telephone Encounter (Signed)
On 12/12/17 I received a d/c from [facility] (original[facility] The d/c is for cremation. The patient is a patient of Doctor Mannam. The d/c will be taken to Pulmonary Unit @ [facility] for signature.

## 2017-12-15 ENCOUNTER — Telehealth: Payer: Self-pay

## 2017-12-15 NOTE — Telephone Encounter (Signed)
On 12/15/17 I received the d/c back from Doctor Mannam. I got the d/c ready and called the funeral home to let them know I mailed the d/c to vital records per the funeral home request.

## 2017-12-19 NOTE — Progress Notes (Signed)
NUTRITION NOTE  Consult received for TF. Pt is currently intubated. Spoke with Dr. Phillips OdorGolding and Dr. Kendrick FriesMcQuaid; plan at this time is for one-way extubation and for pt to be placed on a morphine drip. Dr. Phillips OdorGolding states plan for TPN to be continued (Clinimix 5/15 @ 83 mL/hr) but to hold off on TF at this time.   Will continue to monitor and will provide interventions as needed.   Trenton GammonJessica Leveon Pelzer, MS, RD, LDN, [facility] Inpatient Clinical Dietitian Pager # [phone] After hours/weekend pager # [phone]

## 2017-12-19 NOTE — Procedures (Signed)
Extubation Procedure Note  Patient Details:   Name: Emma Washington DOB: 12-12-1960 MRN: [id number]   Airway Documentation:     Evaluation  O2 sats: stable throughout Complications: No apparent complications Patient did tolerate procedure well. Bilateral Breath Sounds: Clear, Diminished   No  Suzan GaribaldiCraddock, Kalan Yeley Ann 11/28/2017, 11:40 AM

## 2017-12-19 NOTE — Progress Notes (Signed)
Palliative Care  Met with family at bedside with Dr. Lake Bells after comprehensive discussion of options and recommendations was dicussed with medical team. Will proceed with palliative removal of mechanical ventilation with comfort being our primary goal. Will titrate her off of fentanyl while initiating a morphine infusion. Ok to extubate to nasal cannula O2 at 4L. Maintain other non-invasive interventions for now.  Lane Hacker, DO Palliative Medicine

## 2017-12-19 NOTE — Progress Notes (Signed)
   12/13/2017 1500  Clinical Encounter Type  Visited With Family  Visit Type Follow-up;Psychological support;Spiritual support;Death  Referral From Nurse  Consult/Referral To Chaplain  Spiritual Encounters  Spiritual Needs Grief support  Stress Factors  Family Stress Factors Loss   I followed up with the patient's family and friends who were present at the bedside.  I provided grief support.  Chaplain Clint BolderBrittany Jahleel Stroschein M.Div., [facility]

## 2017-12-19 NOTE — Progress Notes (Addendum)
TOD 1532. No hearts sounds heard upon auscultation by RN and Dr. Phillips OdorGolding. Family and friends at bed side. Chaplain has been contacted.  [employer] Contacted @ [phone]. Reference #: [id number] spoke with representative Cammy BrochureLatoshia Moore.

## 2017-12-19 NOTE — Death Summary Note (Signed)
DEATH SUMMARY   Patient Details  Name: Emma Washington MRN: [id number] DOB: [birth year]  Admission/Discharge Information   Admit Date:  29-Nov-2017  Date of Death: Date of Death: 12/31/17  Time of Death: Time of Death: 03-25-31  Length of Stay: 03/25/31  Referring Physician: No primary care provider on file.   Reason(s) for Hospitalization  Abdominal pain  Diagnoses  Preliminary cause of death:   Acute pancreatitis Secondary Diagnoses (including complications and co-morbidities):  Principal Problem:   Necrotizing pancreatitis Active Problems:   Gallstone pancreatitis   Acute respiratory failure (HCC)   Hypocalcemia   AKI (acute kidney injury) (HCC)   Anemia   Morbid obesity with body mass index of 40.0-49.9 (HCC)   Polycystic ovarian syndrome   Steatohepatitis, nonalcoholic   Nephrolithiasis   GERD (gastroesophageal reflux disease)   Cholelithiasis   HCAP (healthcare-associated pneumonia)   Volume overload   Acute respiratory failure with hypoxia and hypercapnia (HCC)   Acute metabolic encephalopathy   Oral thrush   Brief Hospital Course (including significant findings, care, treatment, and services provided and events leading to death)  Emma Washington is a 57 y.o. year old female who was admitted with abdominal pain and found to have acute pancreatitis.  She was hospitalized for several weeks.  She had multiple fluid collections throughout her abdomen.  After an initial ICU stay she was able to be transferred out to the medical surgical floor but returned with increasing shortness of breath and signs of sepsis.  She required mechanical ventilation.  She had anasarca.  She was treated with diuretics, antibiotics.  We had multiple conversations with the patient's family as well as her coworkers.  They advised that we focus on her comfort.  She died peacefully with family and coworkers at the bedside.  Palliative care was involved.    Pertinent Labs and Studies  Significant Diagnostic  Studies Ct Abdomen Pelvis Wo Contrast  Result Date: 12/03/2017 CLINICAL DATA:  Inpatient. Necrotizing gallstone pancreatitis. Clinical deterioration. Respiratory failure. Acute kidney injury. EXAM: CT ABDOMEN AND PELVIS WITHOUT CONTRAST TECHNIQUE: Multidetector CT imaging of the abdomen and pelvis was performed following the standard protocol without IV contrast. COMPARISON:  11/28/2017 abdominal radiograph. 11/25/2017 CT abdomen/pelvis. FINDINGS: Abdominal images are degraded by streak artifact from the patient's upper extremities. Lower chest: Small dependent bilateral pleural effusions, right greater than left, increased on the right and decreased on the left since 11/25/2017 CT. Complete right lower lobe and moderate left lower lobe atelectasis. Tip of superior approach central venous catheter is seen at the cavoatrial junction. Stable nodular fat stranding in the left pericardiophrenic region. Hepatobiliary: Normal liver size. No liver mass. Layering sludge versus noncalcified stones in gallbladder with no gallbladder wall thickening. No biliary ductal dilatation. CBD diameter approximately 3 mm. Pancreas: There is persistent thickening and non-enhancement of entire pancreatic neck, pancreatic body and pancreatic tail with extensive peripancreatic fluid extending into lesser sac and bilateral anterior paranephric retroperitoneum, compatible with severe necrotizing pancreatitis. No pancreatic parenchymal gas. The midline retroperitoneal/mesenteric root fluid collection measures 11.6 x 8.7 cm (series 2/image 60), previously 11.6 x 8.7 cm using similar measurement technique on 11/25/2017 CT, stable. The lesser sac 12.7 x 10.7 cm fluid collection (series 2/image 20) is increased from 11.2 x 7.1 cm. The left retroperitoneal 13.4 x 11.4 cm collection (series 2/image 44) is mildly increased from 12.8 x 10.0 cm using similar measurement technique. No evidence of pancreatic duct dilation. Spleen: Normal size. No  mass. Adrenals/Urinary Tract: Normal adrenals. No renal stones.  No hydronephrosis. No contour deforming renal mass. Bladder collapsed by indwelling Foley catheter. Bladder appears grossly normal. Stomach/Bowel: Enteric tube terminates in the body of the stomach. Nondistended stomach appears grossly normal. Normal caliber small bowel with no small bowel wall thickening. Normal appendix. Normal large bowel with no diverticulosis, large bowel wall thickening or pericolonic fat stranding. Vascular/Lymphatic: Normal caliber abdominal aorta. Stable mild mesenteric adenopathy, for example a 1.8 cm right mesenteric node (series 2/image 68). No pathologically enlarged pelvic nodes. Reproductive: Stable mildly enlarged myomatous uterus. Symmetric round soft tissue in the adnexal regions is stable and presumably represents mildly prominent ovarian tissue. Other: Moderate volume ascites is slightly increased. No pneumoperitoneum. There is extensive nodular soft tissue stranding throughout the omentum, not appreciably changed, which was not present on 11/06/2017 CT. Increased anasarca. Musculoskeletal: No aggressive appearing focal osseous lesions. Mild thoracolumbar spondylosis. IMPRESSION: 1. Persistent findings of severe necrotizing pancreatitis. Large peripancreatic fluid collections in the lesser sac, mesenteric root and anterior left retroperitoneum are stable to mildly increased, as detailed. 2. Moderate ascites, slightly increased. Small dependent bilateral pleural effusions, right greater than left, increased on the right and decreased on the left. Increased anasarca. 3. Stable extensive nodular soft tissue stranding throughout the omentum, presumably representing inflammatory nodular peritonitis, as this finding was not present on 11/19/2017 CT. 4. Stable mild mesenteric adenopathy, nonspecific, probably reactive. 5. Layering sludge versus noncalcified stones in the gallbladder. No biliary ductal dilatation.  Electronically Signed   By: Delbert Phenix M.D.   On: 12/03/2017 12:55   Ct Abdomen Pelvis Wo Contrast  Result Date: 11/25/2017 CLINICAL DATA:  Gallstone pancreatitis.  Evaluate for abscess. EXAM: CT ABDOMEN AND PELVIS WITHOUT CONTRAST TECHNIQUE: Multidetector CT imaging of the abdomen and pelvis was performed following the standard protocol without IV contrast. COMPARISON:  11/11/2017 FINDINGS: Lower chest: Bilateral pleural effusions with overlying airspace consolidation identified left greater than right. Increased from previous exam. Hepatobiliary: No focal liver abnormality. The gallbladder is unremarkable. Pancreas: Evaluation of the pancreas is diminished due to lack of IV contrast material. Compared with the previous exam there appears to have been interval progression of necrotizing pancreatitis. The neck body and tail of pancreas are no longer distinguishable from the surrounding inflammatory changes. Spleen: Spleen negative. Adrenals/Urinary Tract: Normal appearance of the adrenal glands. The kidneys are unremarkable. No mass or hydronephrosis. Urinary bladder is collapsed around a Foley catheter balloon. Stomach/Bowel: Left upper quadrant pseudocyst has mass effect upon the posterior wall of the stomach. No abnormal dilatation of the gastric lumen. The small bowel loops are nondilated. There is no pathologic dilatation of the colon. Vascular/Lymphatic: Normal appearance of the abdominal aorta. Multiple prominent mesenteric lymph nodes are identified and appear increased from previous exam, likely reactive. No pelvic or inguinal adenopathy. Reproductive: There is a fibroid arising from the left lateral uterine fundus measuring approximately 4.2 cm. Other: There has been significant interval increase in volume of ascites within the abdomen and pelvis. Although limited due to lack of IV and oral contrast material multiple pseudocyst formation within the abdomen is suspected. The largest is in the central  mesentery measuring approximately 8.0 by 11.6 cm, image 65 of series 2. Within the lesser sac there is a suspected pseudocyst measuring 11.2 by 7.1 cm. Loculated fluid along the left pericolic gutter fluid collection along the inferior margin of the spleen in the left kidney measures approximately 12.5 by 8.2 cm. Musculoskeletal: No acute or significant osseous findings. IMPRESSION: 1. Progressive changes of suspected necrotizing pancreatitis identified. 2.  Increase in volume of ascites within the abdomen and pelvis. 3. Within the limitations of unenhanced technique multiple fluid collections representing pseudo cysts are suspected within the central mesentery, lesser sac of the left upper quadrant and below the spleen. Electronically Signed   By: Signa Kell M.D.   On: 11/25/2017 15:07   Dg Chest 2 View  Result Date: 11/25/2017 CLINICAL DATA:  Patient admitted 10/29/2017 with gallstone pancreatitis which involved into necrotizing pancreatitis. Respiratory distress. EXAM: CHEST  2 VIEW COMPARISON:  Single-view of the chest 11/23/2017 and 11/19/2017. FINDINGS: Right PICC remains in place. Left much greater than right pleural effusions are seen. The patient's left effusion appears increased. Associated basilar atelectasis is noted. There is interstitial pulmonary edema. Heart size is upper normal. IMPRESSION: Left greater than right pleural effusions and airspace disease. Left effusion appears increased since yesterday's exam. Pulmonary edema persists. Electronically Signed   By: Drusilla Kanner M.D.   On: 11/25/2017 15:01   Dg Abd 1 View  Result Date: 11/28/2017 CLINICAL DATA:  Bedside nasogastric tube placement. Current history of necrotizing pancreatitis. EXAM: Portable ABDOMEN-1 VIEW COMPARISON:  11/26/2017, 11/19/2017, 11/13/2017. Unenhanced CT abdomen and pelvis 11/25/2017. FINDINGS: Nasogastric tube tip in the mid body of the stomach, unchanged since the examination 2 days ago. Visualized bowel gas  pattern nonobstructive. IMPRESSION: Nasogastric tube tip in the mid body of the stomach Electronically Signed   By: Hulan Saas M.D.   On: 11/28/2017 11:17   Dg Abd 1 View  Result Date: 11/26/2017 CLINICAL DATA:  Orogastric tube placement EXAM: ABDOMEN - 1 VIEW COMPARISON:  None. FINDINGS: Orogastric tube with the tip projecting over the antrum of the stomach. There is no bowel dilatation to suggest obstruction. There is no evidence of pneumoperitoneum, portal venous gas or pneumatosis. There are no pathologic calcifications along the expected course of the ureters. The osseous structures are unremarkable. IMPRESSION: Orogastric tube with the tip projecting over the antrum of the stomach. Electronically Signed   By: Elige Ko   On: 11/26/2017 13:39   US Paracentesis  Result Date: 12/04/2017 INDICATION: Patient with history of necrotizing pancreatitis, respiratory failure, ascites. Request made for diagnostic and therapeutic paracentesis. EXAM: ULTRASOUND GUIDED DIAGNOSTIC AND THERAPEUTIC PARACENTESIS MEDICATIONS: None. COMPLICATIONS: None immediate. PROCEDURE: Informed written consent was obtained from the patient after a discussion of the risks, benefits and alternatives to treatment. A timeout was performed prior to the initiation of the procedure. Initial ultrasound scanning demonstrates a moderate amount of ascites within the left lower abdominal quadrant. The left lower abdomen was prepped and draped in the usual sterile fashion. 1% lidocaine was used for local anesthesia. Following this, a Yueh catheter was introduced. An ultrasound image was saved for documentation purposes. The paracentesis was performed. The catheter was removed and a dressing was applied. The patient tolerated the procedure well without immediate post procedural complication. FINDINGS: A total of approximately 3.8 liters of clear, yellow fluid was removed. Samples were sent to the laboratory as requested by the clinical  team. IMPRESSION: Successful ultrasound-guided diagnostic and therapeutic paracentesis yielding 3.8 liters of peritoneal fluid. Read by: Jeananne Rama, PA-C Electronically Signed   By: Simonne Come M.D.   On: 12/04/2017 16:05   Dg Chest Port 1 View  Result Date: 12/04/2017 CLINICAL DATA:  Respiratory failure.  ETT. EXAM: PORTABLE CHEST 1 VIEW COMPARISON:  11/30/2017. FINDINGS: Rotated radiograph is suboptimal. Cardiomegaly. ET tube 3 cm above carina. PICC line tip cavoatrial junction. BILATERAL pulmonary opacities, likely pulmonary edema. Worsening aeration  from priors. IMPRESSION: Suboptimal rotated radiograph demonstrates ET tube 3 cm above carina. Worsening aeration consistent with pulmonary edema. Electronically Signed   By: Elsie Stain M.D.   On: 12/04/2017 06:58   Dg Chest Port 1 View  Result Date: 11/30/2017 CLINICAL DATA:  Respiratory failure EXAM: PORTABLE CHEST 1 VIEW COMPARISON:  11/26/2017 FINDINGS: Support devices are stable. Cardiomegaly. Bilateral lower lobe opacities and layering effusions. Vascular congestion. IMPRESSION: Continued bilateral effusions and bibasilar atelectasis or infiltrates. Cardiomegaly, vascular congestion. Electronically Signed   By: Charlett Nose M.D.   On: 11/30/2017 07:14   Dg Chest Port 1 View  Result Date: 11/26/2017 CLINICAL DATA:  Status post intubation. EXAM: PORTABLE CHEST 1 VIEW COMPARISON:  Radiograph of same day. FINDINGS: Stable cardiomegaly and central pulmonary vascular congestion. Endotracheal tube is seen projected over tracheal air shadow with distal tip 5 cm above the carina. Nasogastric tube is seen entering the stomach. Bilateral basilar opacities are noted concerning for atelectasis with associated pleural effusions. No pneumothorax is noted. Bony thorax is unremarkable. IMPRESSION: Endotracheal and nasogastric tubes are in grossly good position. Bibasilar opacities are noted concerning for subsegmental atelectasis with associated pleural  effusions. Electronically Signed   By: Lupita Raider, M.D.   On: 11/26/2017 13:46   Dg Chest Port 1 View  Result Date: 11/26/2017 CLINICAL DATA:  Shortness of Breath EXAM: PORTABLE CHEST 1 VIEW COMPARISON:  November 25, 2017 FINDINGS: Central catheter tip is in the superior vena cava. No pneumothorax. There are pleural effusions bilaterally with patchy bibasilar atelectasis. There is no consolidation or appreciable edema. Heart is borderline enlarged with pulmonary vascular within normal limits. No adenopathy. No bone lesions. IMPRESSION: Bilateral pleural effusions with bibasilar atelectasis. Stable cardiac prominence. No frank edema or consolidation evident. Central catheter tip in superior vena cava without pneumothorax. Electronically Signed   By: Bretta Bang III M.D.   On: 11/26/2017 08:11   Dg Chest Port 1 View  Result Date: 11/23/2017 CLINICAL DATA:  Shortness of breath EXAM: PORTABLE CHEST 1 VIEW COMPARISON:  11/19/2017 FINDINGS: Left lower lobe opacity, unchanged, likely reflecting a combination of atelectasis and pleural effusion. Right lung is essentially clear. Mild pulmonary vascular congestion. No frank interstitial edema. No pneumothorax. Cardiomegaly. Right subclavian catheter terminates at the cavoatrial junction. IMPRESSION: Left lower lobe opacity, unchanged, likely reflecting combination of atelectasis and pleural effusion. Pulmonary vascular congestion without frank interstitial edema. Electronically Signed   By: Charline Bills M.D.   On: 11/23/2017 09:04   Dg Chest Port 1v Same Day  Result Date: 11/19/2017 CLINICAL DATA:  Increase shortness of breath EXAM: PORTABLE CHEST 1 VIEW COMPARISON:  11/11/2017 FINDINGS: 0851 hours. Low lung volumes. The cardio pericardial silhouette is enlarged. There is pulmonary vascular congestion without overt pulmonary edema. Persistent left base collapse/consolidation, similar. Right PICC line tip overlies the distal SVC. The visualized bony  structures of the thorax are intact. IMPRESSION: No substantial interval change. Cardiomegaly with vascular congestion and left base collapse/consolidation. Electronically Signed   By: Kennith Center M.D.   On: 11/19/2017 10:02   Dg Abd Decub  Result Date: 11/19/2017 CLINICAL DATA:  Ileus, abdominal distention. EXAM: ABDOMEN - 1 VIEW DECUBITUS COMPARISON:  11/13/2017 and CT abdomen pelvis 11/11/2017. FINDINGS: Two left lateral decubitus views of the abdomen show air-fluid levels in dilated bowel. No definite free air. Abdomen is incompletely imaged. IMPRESSION: Gaseous distention of bowel with air-fluid levels is compatible with the given history of an ileus. Abdomen is incompletely imaged. Electronically Signed  By: Leanna BattlesMelinda  Blietz M.D.   On: 11/19/2017 12:02    Microbiology No results found for this or any previous visit (from the past 240 hour(s)).  Lab Basic Metabolic Panel: No results for input(s): NA, K, CL, CO2, GLUCOSE, BUN, CREATININE, CALCIUM, MG, PHOS in the last 168 hours. Liver Function Tests: No results for input(s): AST, ALT, ALKPHOS, BILITOT, PROT, ALBUMIN in the last 168 hours. No results for input(s): LIPASE, AMYLASE in the last 168 hours. No results for input(s): AMMONIA in the last 168 hours. CBC: No results for input(s): WBC, NEUTROABS, HGB, HCT, MCV, PLT in the last 168 hours. Cardiac Enzymes: No results for input(s): CKTOTAL, CKMB, CKMBINDEX, TROPONINI in the last 168 hours. Sepsis Labs: No results for input(s): PROCALCITON, WBC, LATICACIDVEN in the last 168 hours.  Procedures/Operations    Max FickleDouglas [place] 12/15/2017, 2:52 PM

## 2017-12-19 NOTE — Progress Notes (Signed)
eLink Physician-Brief Progress Note Patient Name: Jannet MantisJoyce Hottel DOB: 24-Aug-1961 MRN: [id number]   Date of Service  12/17/2017  HPI/Events of Note    eICU Interventions  Hypokalemia -repleted      Intervention Category Minor Interventions: Electrolytes abnormality - evaluation and management  Rakesh V. Alva 12/12/2017, 2:55 AM

## 2017-12-19 NOTE — Progress Notes (Signed)
Wasted 150 ml fentanyl gtt, and 20 ml propofol gtt with Neysa Bonitohristy, RN. WIS.

## 2017-12-19 NOTE — Progress Notes (Signed)
   May 03, 2018 1300  Clinical Encounter Type  Visited With Patient and family together  Visit Type Follow-up;Psychological support;Spiritual support;Patient actively dying  Referral From Nurse  Consult/Referral To Chaplain  Spiritual Encounters  Spiritual Needs Emotional;Other (Comment) (Spiritual Care Conversation/Support)  Stress Factors  Patient Stress Factors Not reviewed  Family Stress Factors Major life changes   I visited with the patient per referral from the nurse. This was a follow-up for family/friend support due to the patient's declining condition.  No needs were stated at this time.   Please, contact Spiritual Care for further assistance.   Chaplain Clint BolderBrittany Yashika Mask M.Div., [facility]

## 2017-12-19 NOTE — Progress Notes (Addendum)
PHARMACY - ADULT TOTAL PARENTERAL NUTRITION CONSULT & ANTIBIOTIC NOTE  Pharmacy Consult for TPN/Meropenem Indication: severe necrotizing pancreatitis  Patient Measurements: Height: '5\' 10"'  (177.8 cm) Weight: (!) 320 lb 1.7 oz (145.2 kg) IBW/kg (Calculated) : 68.5 TPN AdjBW (KG): 85.3 Body mass index is 45.93 kg/m.   Antimicrobials this admission: 1/14 Zosyn >> 1/22 1/22 Meropenem >> 1/28, 2/6 >> 2/5 Fluconazole >> 2/10 2/7 Vancomycin >>   Dose adjustments this admission: 1/23 Meropenem 1g q12h > q8h since CRRT stopped 2/7 change meropenem to 2g q12h for worsening SCr   Microbiology results: 1/14 MRSA PCR: negative 1/15 Surgical PCR: neg/neg 1/22 BCx: ng-final 2/5 UCx: Enterococcus faecium (S- ampicillin, levaquin, vanc; R- NTF) 2/6 strep ur ag: neg 2/6 legionella ur ag: neg 2/6 BCx: NGF 2/7 Trach cx: normal flora (final)  Insulin Requirements:  - 14 units Moderate Novolog SSI / 24hrs - 83 units regular insulin in TPN  Current Nutrition: NPO, TPN at 83 ml/hr, trickle TF currently off  IVF: D5W with 140mq sodium bicarb at 578mhr started 2/11, increasing rate 10027mr 2/14  Central access: R IJ HD catheter TPN start date: 1/24  ASSESSMENT                                                                                                          HPI: [id number]. Female with gallstone pancreatitis with necrosis s/p MRCP on 1/15.  Repeat LFTs and lipase normalizing.  No plans for surgical intervention at this time; planning for delayed cholecystectomy per [facility].  Tube feeding was started but had to be stopped on 1/22 due to nausea/vomiting.  Pharmacy is consulted to begin TPN. Patient's course has been complicated with respiratory failure and intubation and subsequent extubation on 11/09/2017.  She was also on CRRT from 1/18-1/23.  Significant events:  1/28: CL diet 1/29: FL diet 1/30: did not tolerate FLD, back to CLD, weight incr 305 > 314  2/2 Remains volume overloaded, but  respiratory status is improved.  Avoiding lasix prior to contrast CT early next week.  Tolerating volume and OK to continue to advance towards goal rate per MD. 2/6 repeat CT scan yesterday reveals interval progression of necrotizing pancreatitis.  CXR shows bilateral pleural effusions and airspace disease. With WBC increase, resuming antibiotics with Meropenem for IAI and possible HCAP. PCCM re-consulted and patient re-intubated. 2/11 Bicarb drip at 47m81m added by CCM for NAG acidosis. Total IVF rate now 160ml79m- monitor closely for development of fluid overload and consider decreasing TPN rate if necessary. 2/13 Trickle feeds initiated with Vital High Protein at 20ml/65mToday: 2/15/2February 22, 2019ose: No hx DM. CBGs elevated since starting TPN but improved with addition of insulin to TPN (CBG 133-156).  Requiring high amount of insulin in TPN to keep CBGs controlled in setting of pancreatitis. Trending back up with initiation of TF.  Electrolytes:   Na had been low but now WNL  K at low end of goal  CorrCa 10.38 withOUT electrolytes in TPN.  Phos-Calcium product down 48.78 (goal <55).  Continue with electrolyte-free  TPN bag for now.  Renal: AKI on CRRT 1/18-1/23. SCr down 1.2.  I/O net -861 mL, still with adequate UOP per recordings yesterday, weight up.  LFTs: AST/ALT elevated but improved from yesterday, Alk phos elevated and rising, Tbili normal.  TGs: 213 (1/15), 332 (1/18), 276 (1/28) 224 (2/4), 213 (2/11)  Prealbumin: 6.7 (1/25), 9.3 (1/28), 7.1 (2/4), 10.9 (2/11)  NUTRITIONAL GOALS                                                                                           RD recs 2/7: 1500-1704 kcal, >/= 136 grams protein per day  With updated nutrition goals post re-intubation, will remove the daily lipids for now to continue to meet high protein needs without significantly over feeding calories.  New goal with Clinimix 5/15 at 175m/hr provides 132g/day protein, 1874Kcal/day.    Vital High Protein at 268mhr providing 42 grams protein and 480kcal per day, therefore will adjust TPN rate to compensate.  PLAN                                                                                                                         Now: KCl 4074mvia tube x 1 and Magnesium sulfate 1g IV x 1 per MD  At 1800 today:  Continue Clinimix 5/15 (NO electrolytes) to 83 ml/hr.   Hold lipid emulsion to meet high protein needs without overfeeding kcal in this critically ill obese patient.  TPN to contain standard multivitamins and trace elements  Continue insulin in TPN, 83 units per 24 hr - may need to increase if CBGs continue to trend upward.   Continue SSI q4h MODERATE scale  IVF management per MD - bicarb drip at 100m72m  TPN lab panels on Mondays & Thursdays.  Continue Meropenem 2g IV q8h   F/u daily.   ErinPeggyann JubaarmD, BCPS Pager: [phone] 17, [id number] AM   Addendum: SpokDamaris Schoonerh Brandi from CCM and Dr. GoldHilma Favorsased on patient's clinical situation, will dc TPN after current bag today. - decrease rate to 40ml2mnow - stop at 6pm tonigMadelyn FlavorsrmD, BCPS 12/06/15-Mar-2019 PM

## 2017-12-19 NOTE — Progress Notes (Signed)
Providing support with pt's brother and extended work family at bedside.    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  Grieving appropriately.  Pt's brother is quiet at bedside.  Tearful with one word answers, he is open to chaplain presence in room.   Brother Stepped out of room and off unit during this chaplain visit.   We will continue to support during this comfort care process.

## 2017-12-19 NOTE — Progress Notes (Signed)
[id number] RN in room and patient went into [facility] at 230bpm.  Code called.  Patient still had thready pulse..., pt bagged per RT.  Defibrillator pads applied immediately and cpr board placed behind patients back.  Elink notified right away.  CPR began but patient raised right arm.  Pulse again felt for...thready but present.  ER physician to bedside and pt spontaneously converted without intervention. Dr. Vassie LollAlva notified.  Stat labs sent.   Casimiro NeedleMichael, brother notified of situation.  Pts brother requested that she remain a full code and if it happened again to resuciatate her at this time.

## 2017-12-19 NOTE — Progress Notes (Signed)
PULMONARY / CRITICAL CARE MEDICINE   Name: Emma Washington MRN: [id number] DOB: 06-26-1961    ADMISSION DATE:  10/30/2017 CONSULTATION DATE:  11/26/2017 REFERRING MD:  Dr. Janee Morn, [facility]  CHIEF COMPLAINT:  Progressive respiratory failure   HISTORY OF PRESENT ILLNESS:   57 yo female admitted 11/08/2017 with gallstone pancreatitis and necrotizing pancreatitis.  Hospital stay complicated by respiratory failure from ALI, AKI.  She was extubated 1/20.  Since then she developed recurrent hypoxic/hypercapnic respiratory failure requiring reintubation associated with progression of pancreatitis.  PMHx of HTN, GERD, Anemia, Nephrolithiasis, PCOS.   SUBJECTIVE:  RN reports pt had brief VT arrest overnight.  Brother requested full code / ACLS.  Pt stabilized post arrest.  Versed gtt & Phenylephrine off since yesterday.       VITAL SIGNS: BP (!) 164/92   Pulse (!) 110   Temp (!) 97.2 F (36.2 C) (Axillary)   Resp (!) 27   Ht 5\' 10"  (1.778 m)   Wt (!) 320 lb 1.7 oz (145.2 kg)   LMP 11/07/2011   SpO2 100%   BMI 45.93 kg/m   VENTILATOR SETTINGS: Vent Mode: PRVC FiO2 (%):  [40 %] 40 % Set Rate:  [26 bmp] 26 bmp Vt Set:  [500 mL] 500 mL PEEP:  [8 cmH20] 8 cmH20 Plateau Pressure:  [23 cmH20-29 cmH20] 29 cmH20  INTAKE / OUTPUT: I/O last 3 completed shifts: In: 9275.5 [I.V.:8815.5; NG/GT:160; IV Piggyback:300] Out: 8550 [Urine:4750; Other:3800]  PHYSICAL EXAMINATION: General: critically ill appearing adult female in NAD HEENT: MM pink/moist, ETT Neuro: sedate  CV: s1s2 rrr, no m/r/g PULM: even/non-labored, lungs bilaterally coarse  WU:JWJX, non-tender, bsx4 active  Extremities: warm/dry, generalized 1+ edema  Skin: no rashes or lesions   LABS:  BMET Recent Labs  Lab 12/03/17 0638 12/04/17 0323 12/06/17 0115  NA 142 144 143  K 3.9 3.5 3.6  CL 113* 114* 112*  CO2 20* 19* 22  BUN 105* 99* 93*  CREATININE 1.46* 1.30* 1.20*  GLUCOSE 152* 148* 161*    Electrolytes Recent Labs   Lab 12/03/17 0638 12/04/17 0323 12-06-17 0115  CALCIUM 8.9 8.6* 8.3*  MG 2.0 1.9 1.8  PHOS 5.9* 4.9* 4.7*    CBC Recent Labs  Lab 12/01/17 0543 12/04/17 0323 2017/12/06 0143  WBC 29.5* 26.1* 19.4*  HGB 7.7* 7.9* 7.5*  HCT 24.0* 24.5* 23.5*  PLT 569* 615* 513*    Coag's No results for input(s): APTT, INR in the last 168 hours.  Sepsis Markers No results for input(s): LATICACIDVEN, PROCALCITON, O2SATVEN in the last 168 hours.  ABG Recent Labs  Lab 12/01/17 1058 12/03/17 1415 12/04/17 0924  PHART 7.200* 7.178* 7.270*  PCO2ART 43.0 56.4* 49.0*  PO2ART 76.0* 72.2* 70.1*    Liver Enzymes Recent Labs  Lab 12/03/17 0638 12/04/17 0323 December 06, 2017 0115  AST 91* 108* 74*  ALT 71* 100* 81*  ALKPHOS 280* 323* 320*  BILITOT 0.6 0.5 0.3  ALBUMIN 1.5* 1.6* 1.4*    Cardiac Enzymes No results for input(s): TROPONINI, PROBNP in the last 168 hours.  Glucose Recent Labs  Lab 12/04/17 1126 12/04/17 1535 12/04/17 1936 12/04/17 2323 12/06/2017 0331 12-06-17 0742  GLUCAP 170* 156* 157* 150* 140* 149*    Imaging US Paracentesis  Result Date: 12/04/2017 INDICATION: Patient with history of necrotizing pancreatitis, respiratory failure, ascites. Request made for diagnostic and therapeutic paracentesis. EXAM: ULTRASOUND GUIDED DIAGNOSTIC AND THERAPEUTIC PARACENTESIS MEDICATIONS: None. COMPLICATIONS: None immediate. PROCEDURE: Informed written consent was obtained from the patient after a discussion of the  risks, benefits and alternatives to treatment. A timeout was performed prior to the initiation of the procedure. Initial ultrasound scanning demonstrates a moderate amount of ascites within the left lower abdominal quadrant. The left lower abdomen was prepped and draped in the usual sterile fashion. 1% lidocaine was used for local anesthesia. Following this, a Yueh catheter was introduced. An ultrasound image was saved for documentation purposes. The paracentesis was performed. The  catheter was removed and a dressing was applied. The patient tolerated the procedure well without immediate post procedural complication. FINDINGS: A total of approximately 3.8 liters of clear, yellow fluid was removed. Samples were sent to the laboratory as requested by the clinical team. IMPRESSION: Successful ultrasound-guided diagnostic and therapeutic paracentesis yielding 3.8 liters of peritoneal fluid. Read by: Jeananne Rama, PA-C Electronically Signed   By: Simonne Come M.D.   On: 12/04/2017 16:05     STUDIES:  CT abd/pelvis 2/5 >> progressive of necrotizing pancreatitis, pseudocysts CT abd/pelvis 2/13 >> persistent findings of severe necrotizing pancreatitis, moderate ascites (See full report)  CULTURES: UC 2/5 >> enterococcus Blood 2/6 >> negative  Sputum 2/7 >> negative  ANTIBIOTICS: Meropenem 2/6 >> Fluconazole 2/5 >> 2/10 Vancomycin 2/6 >> 2/12  SIGNIFICANT EVENTS: 1/14  Admit  2/06  Re-intubated  2/14  Paracentesis with 3.8L removed.  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 Course complicated by AKI, malnutrition and enterococcal UTI.  Concern for overall poor prognosis. Family considering possible comfort extubation but remain open to any possible therapy that would help her get better.     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 - appreciate help from GI and CCS - case d/w [facility] on 2/07 >> not a candidate for endoscopic drainage - not a surgical candidate - palliative para 2/14, 3.8L removed P: Trickle TF  ABX as above  Follow abdominal fluid cultures  Acute hypoxic/hypercapnic respiratory failure.  Multifactorial in the setting of decreased abdominal compliance, atelectasis, pain/narcotic requirements and new sepsis P: PRVC 8 cc/kg, Rate 26 RASS Goal: 0 to -1  Lighten  sedation as able  Supportive care  Intermittent CXR  Family would not want trach if no opportunity of recovery   Enterococcus UTI. Thrush (completed course of diflucan) P: ABX as above  Acute metabolic encephalopathy. - Did not tolerate precedex due to brady / pauses P: Supportive care Minimze sedation  Discontinue versed gtt from [facility]  Fentanyl gtt for pain / sedation  PRN ativan   Acute renal failure Hypokalemia  P: Free water 100 Q8 Trend BMP / urinary output Replace electrolytes as indicated, additional K/Mg 2/15  Avoid nephrotoxic agents, ensure adequate renal perfusion  Metabolic acidosis (NAG)-->this is likely contributing to her vent mechanics.  P: Bicarbonate gtt @ 184ml/hr   Anemia of critical illness- no evidence for bleeding P: Trend CBC  Monitor for bleeding  Hyperglycemia. P: SSI   DVT prophylaxis - SQ heparin SUP - protonix Nutrition - TPN, Trickle TF Goals of care - Full code.  Palliative care following, appreciate input.  Discussed plan of care with brother Kathlene November).  Family considering palliative extubation 2/15 am.  The remain open to any and all therapies that would give her the chance for recovery but would want a reasonable opportunity for recovery to accept aggressive therapy.  Will plan to meet with Dr. Phillips Odor and  CCM MD later this am.    Canary BrimBrandi Emalia Witkop, NP-C [facility] Pgr: [phone] or if no answer [phone] 12/04/2017, 8:07 AM

## 2017-12-19 NOTE — Progress Notes (Signed)
Palliative Care Post-extubation Follow-up  Patient continues to be severely dyspneic, very labored and despite multiple bolus doses of opioids she continues to have sustained respirations in the 40's. She has worsening secretions. She is diaphoretic and has tachycardia. Given the very high level of discomfort and visible suffering I consented the family for palliative sedation. I remained at the bedside and administered propofol 5mcg via push which resulted in excellent comfort and reduced respirations and distress-several minutes later as expected her symptoms returned and she was placed on a propofol infusion started at 5/hr and titrated up for comfort to mx 20/hr.Emma Washington is actively dying-now full comfort care. Prognosis is hours. Family and hospice RN friends at bedside.  Time: 50 minutes Greater than 50%  of this time was spent counseling and coordinating care related to the above assessment and plan.  Emma MaltaElizabeth Golding, DO Palliative Medicine

## 2017-12-19 DEATH — deceased

## 2019-02-26 IMAGING — CT CT RENAL STONE PROTOCOL
2 of 7 series · 15 of 46 positions shown, 17 images · non-contrast
Comparison: CT abdomen and pelvis April 09, 2008

CLINICAL DATA: Left-sided flank pain and hematuria.  Vomiting.

EXAM:
CT ABDOMEN AND PELVIS WITHOUT CONTRAST
TECHNIQUE: Multidetector CT imaging of the abdomen and pelvis was performed
following the standard protocol without oral or intravenous contrast
material administration.

[Series 2: axial st · axial · 0.98mm/px · z∈[-546,-66]mm · 12 of 109 slices shown, 14 images]
[im 7/109  soft-tissue]
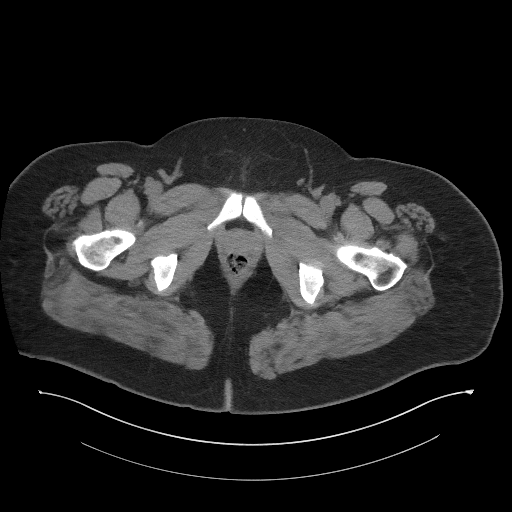
[im 7/109  bone]
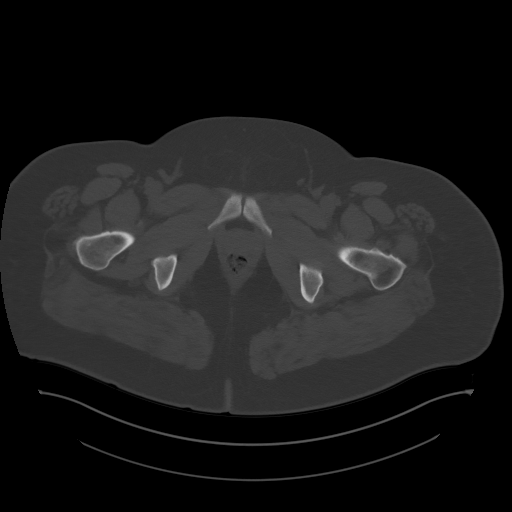
[im 19/109  soft-tissue]
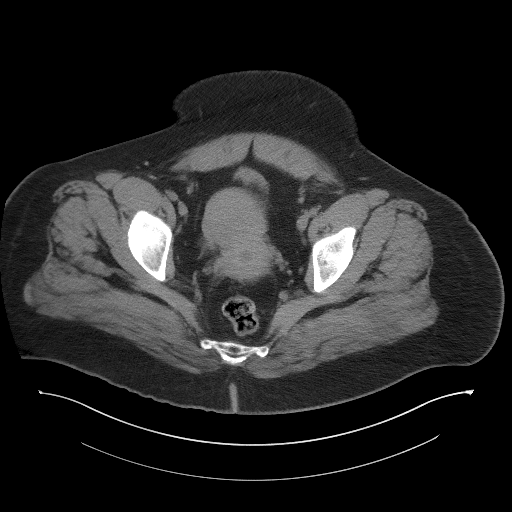
[im 25/109  soft-tissue]
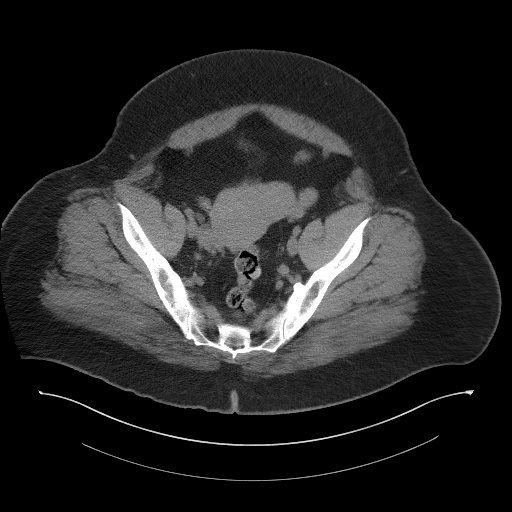
[im 31/109  soft-tissue]
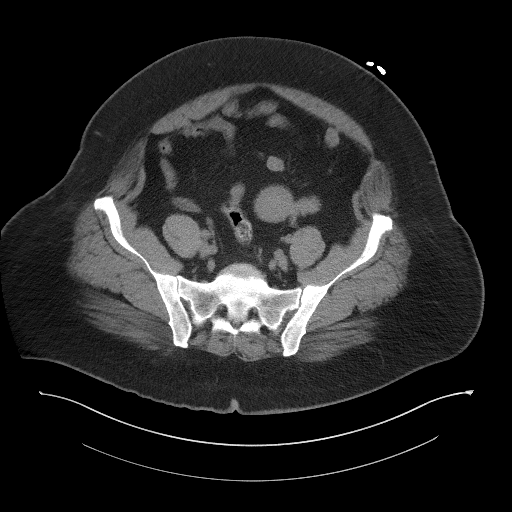
[im 43/109  soft-tissue]
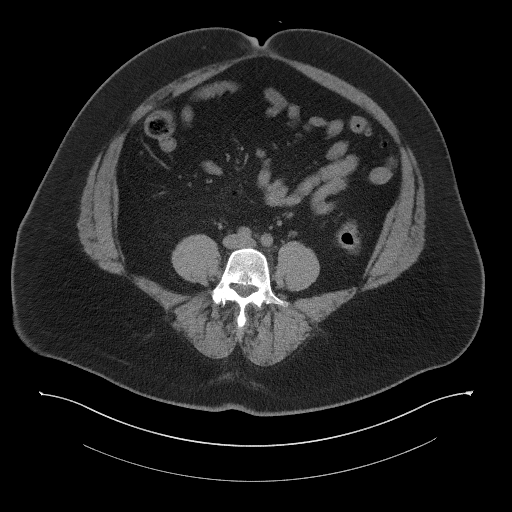
[im 49/109  soft-tissue]
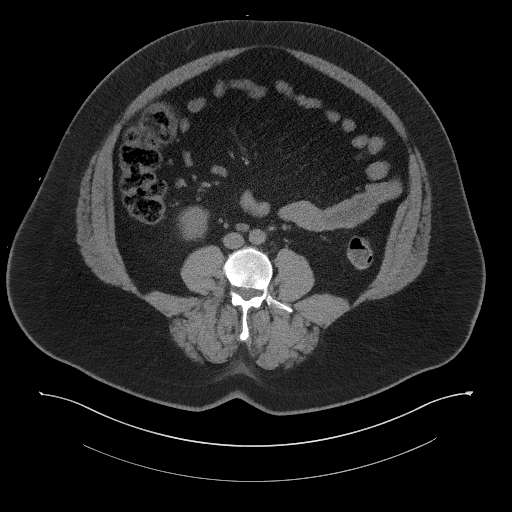
[im 61/109  soft-tissue]
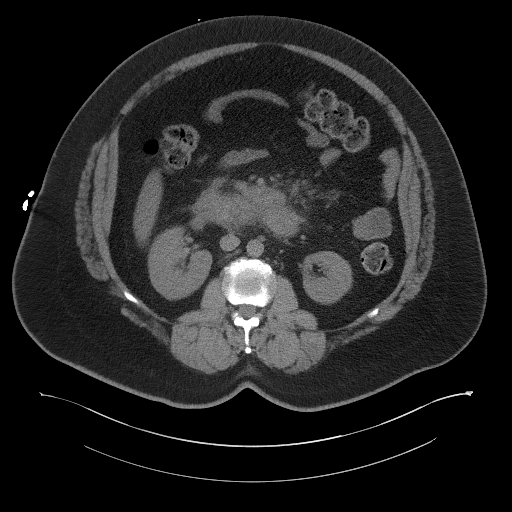
[im 67/109  soft-tissue]
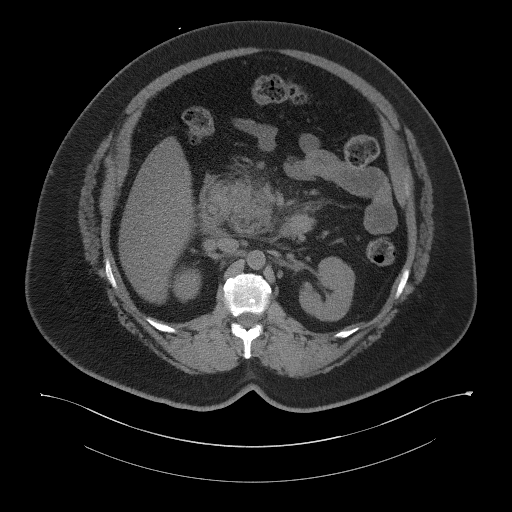
[im 79/109  soft-tissue]
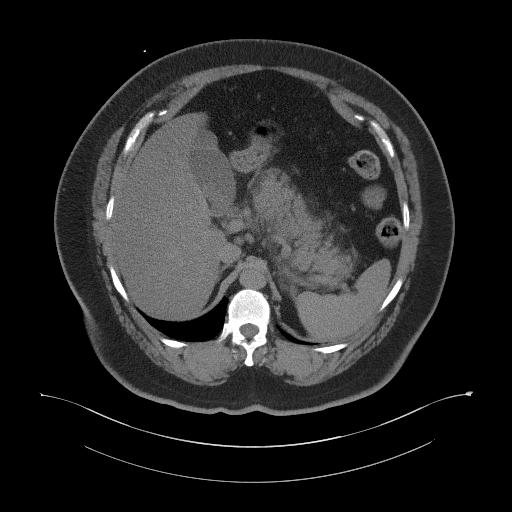
[im 79/109  bone]
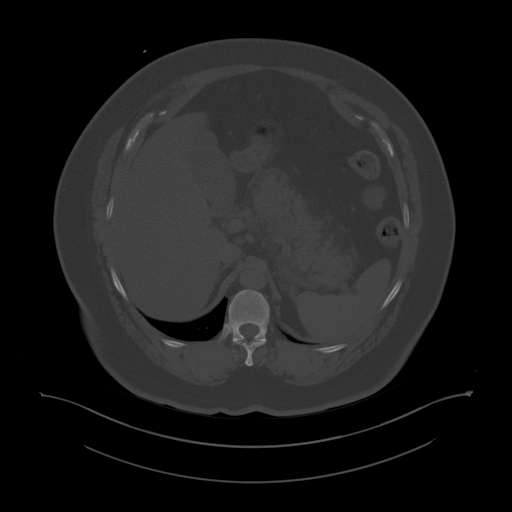
[im 85/109  soft-tissue]
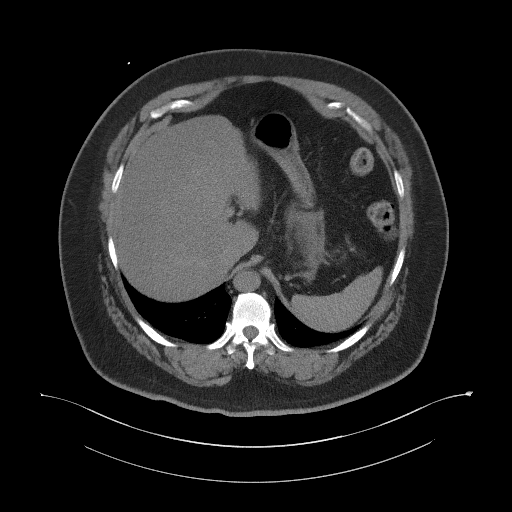
[im 91/109  soft-tissue]
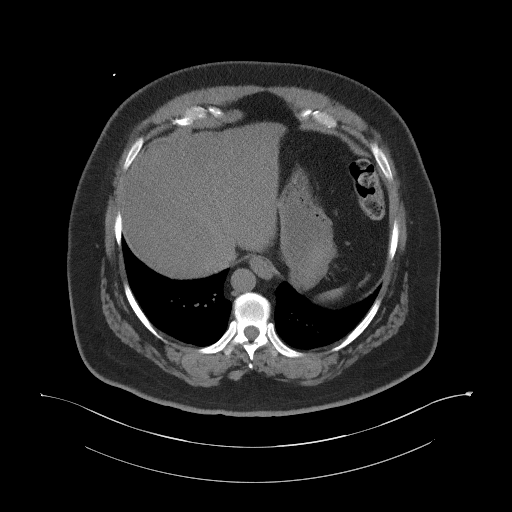
[im 103/109  soft-tissue]
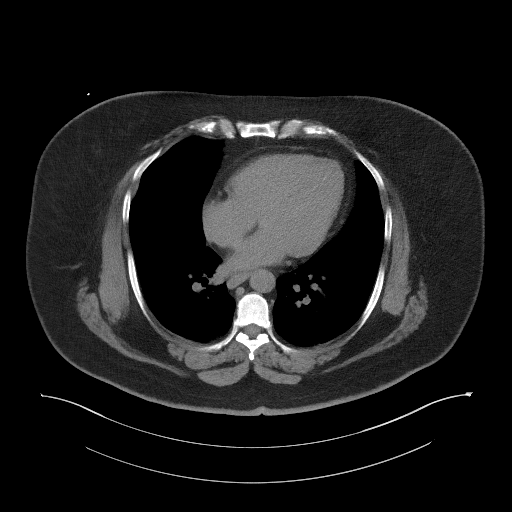

[Series 6: coronal st · coronal · 1.02mm/px · 3 of 134 slices shown]
[im 27/134  soft-tissue]
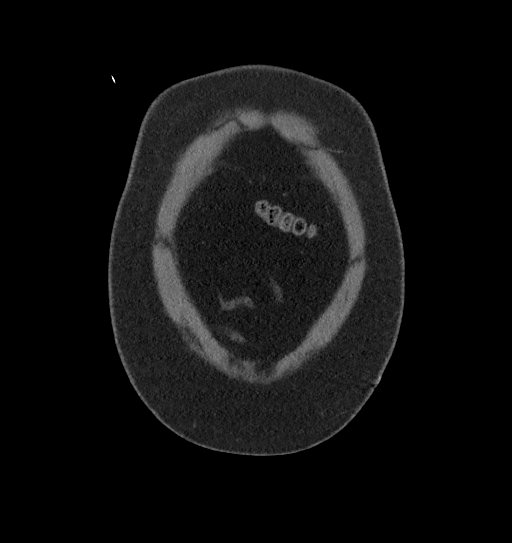
[im 54/134  soft-tissue]
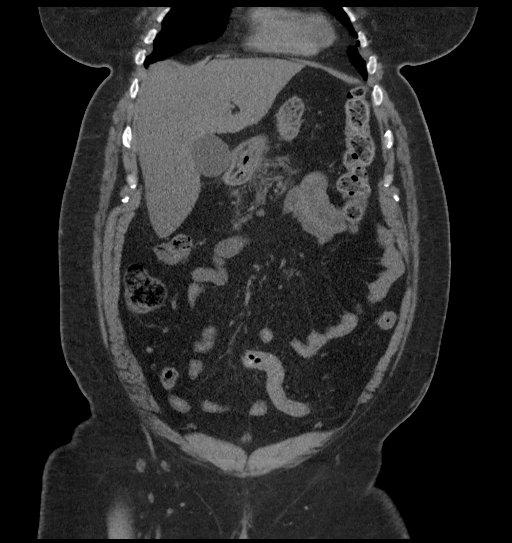
[im 80/134  soft-tissue]
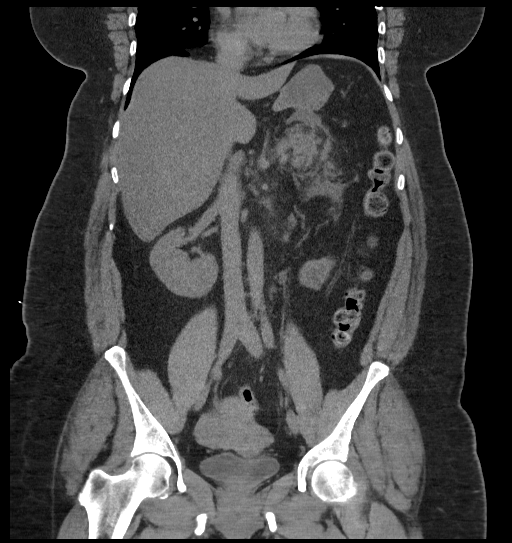

[15 of 46 positions shown; findings below may reference images not displayed]

FINDINGS: Lower chest: There is bibasilar atelectatic change.

Hepatobiliary: There is hepatic steatosis. No focal liver lesions
are evident on this noncontrast enhanced study. Gallbladder wall is
not appreciably thickened. There is no biliary duct dilatation.

Pancreas: There is edema throughout the pancreas with soft tissue
stranding in the adjacent peripancreatic mesentery throughout the
peripancreatic region. Fluid tracks to the right of the pancreatic
head and inferior to the uncinate process as well as anterior to the
pancreatic head. Fluid tracks from the body and tail the pancreas
anteriorly to reach the greater curvature of the stomach in the
midportion of the gastric body region. A well-defined pseudocyst
type lesion is not seen. There is no pancreatic duct dilatation.

Spleen: No splenic lesions are evident.

Adrenals/Urinary Tract: Adrenals bilaterally appear normal. Kidneys
bilaterally show no evident mass or hydronephrosis. There is a 1 mm
calculus in the upper pole left kidney, nonobstructing. There is no
evident ureteral calculus on either side. Urinary bladder is midline
with wall thickness within normal limits.

Stomach/Bowel: There is mild wall thickening along the greater
curvature of the stomach in the midbody region in along the second
and third duodenum due to nearby pancreatitis. No other bowel wall
thickening is evident. There is no appreciable bowel obstruction. No
evident free air or portal venous air.

Vascular/Lymphatic: There is no abdominal aortic aneurysm. No
vascular lesions are appreciable on this study. No adenopathy is
evident in the abdomen or pelvis.

Reproductive: Uterus is anteverted. There is an apparent leiomyoma
extending from the leftward aspect of the uterus anteriorly
measuring 4.3 x 4.0 cm. No extrauterine pelvic mass evident.

Other: Appendix appears normal. No abscess is seen in the abdomen or
pelvis. No free pelvic fluid seen. Loculated fluid adjacent to the
pancreas is noted.

Musculoskeletal: There is degenerative change in the lumbar spine.
There are no blastic or lytic bone lesions. There is no
intramuscular or abdominal wall lesion evident.
IMPRESSION: 1. Acute pancreatitis with pancreatic edema and peripancreatic
fluid, most notably anterior to the body and tail of the pancreas.
No well-defined pseudocyst or noncystic mass. No pancreatic duct
dilatation.

2. Areas of gastritis and duodenitis due to adjacent pancreatitis
causing inflammation in these areas. No bowel obstruction. No
abscess. Appendix appears normal.

3. 1 mm nonobstructing calculus upper pole left kidney. No
hydronephrosis or ureteral calculus on either side.

4.  Hepatic steatosis.

5. Apparent leiomyoma arising from the fundus of the uterus toward
the left superiorly measuring 4.3 x 4.0 cm.

## 2019-04-19 IMAGING — US US ABDOMEN COMPLETE
1 series · 13 of 25 positions shown · non-contrast
Comparison: Abdominal and pelvic CT scan of today's date

CLINICAL DATA: Epigastric and right upper quadrant discomfort,
syncope, vomiting, hematuria.

EXAM:
ABDOMEN ULTRASOUND COMPLETE

[Series 1: us abdomen complete · 0.42mm/px · 13 of 82 slices shown]
[im 1/82]
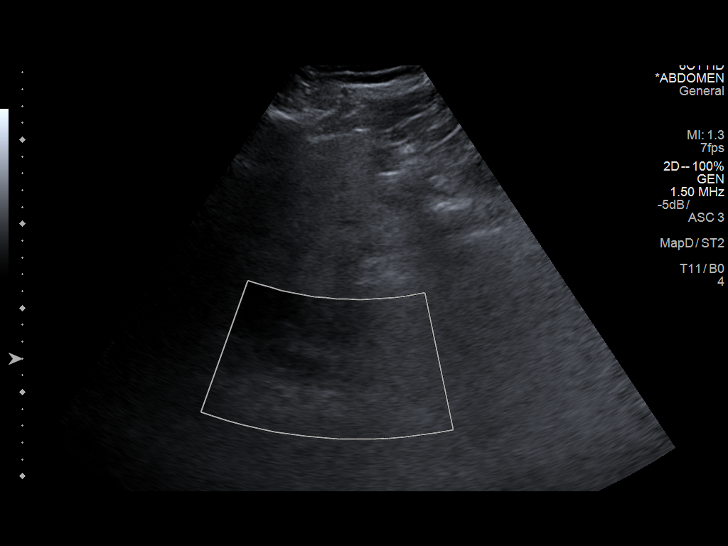
[im 7/82]
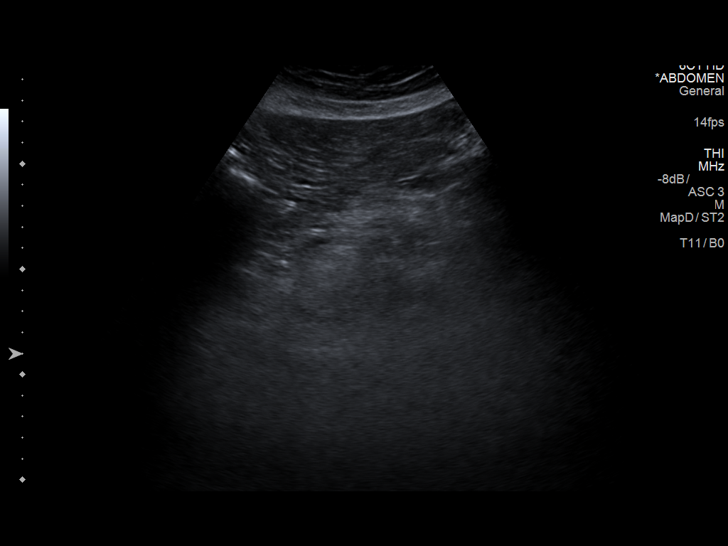
[im 14/82]
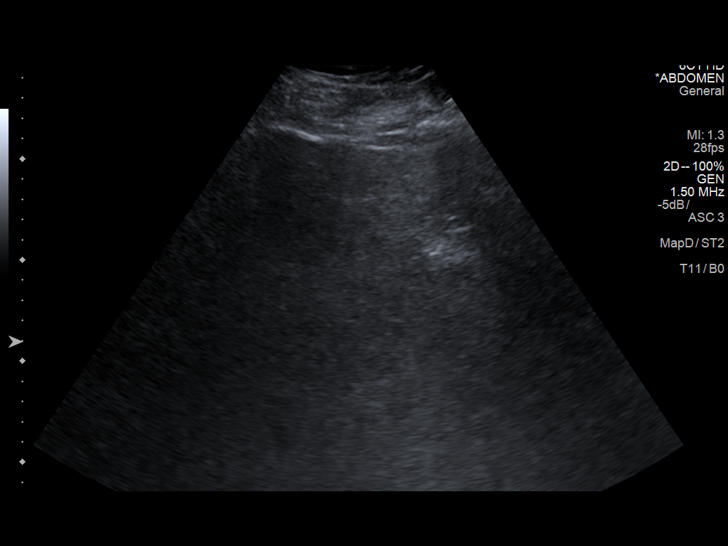
[im 21/82]
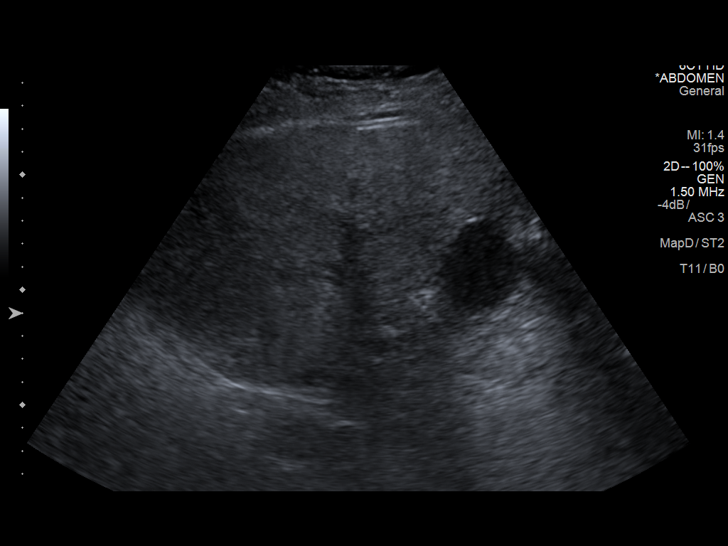
[im 28/82]
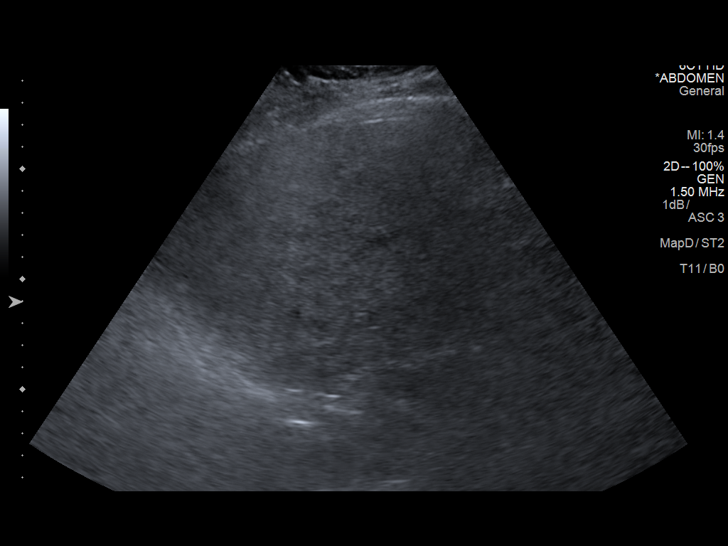
[im 34/82]
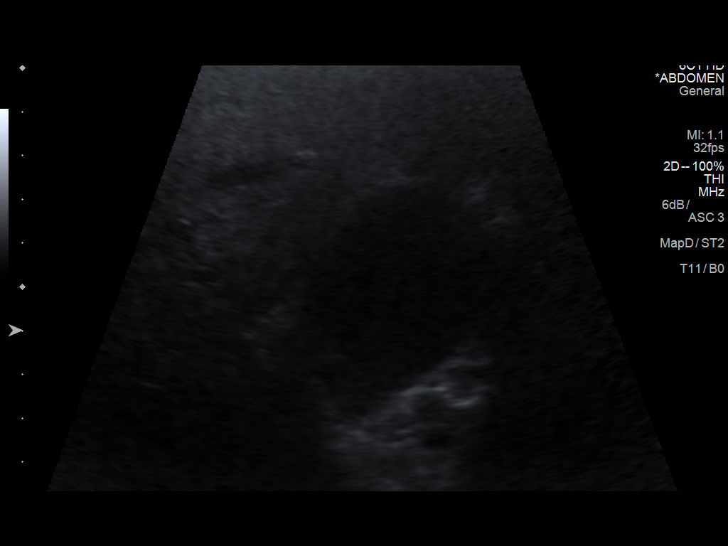
[im 41/82]
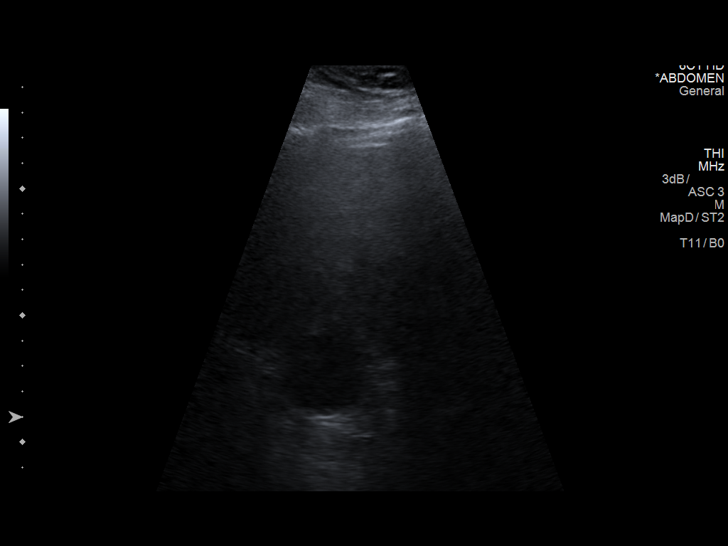
[im 48/82]
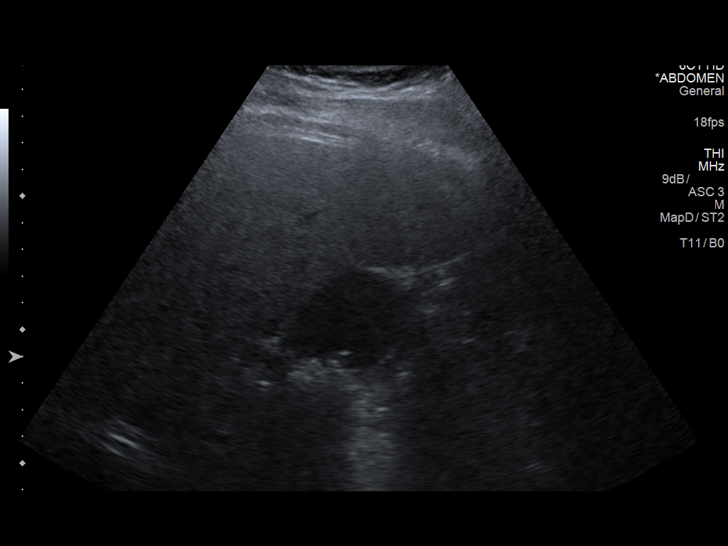
[im 55/82]
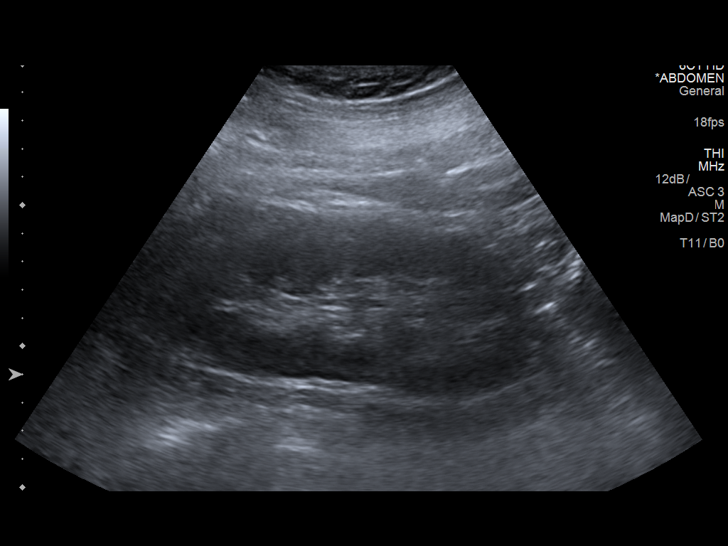
[im 61/82]
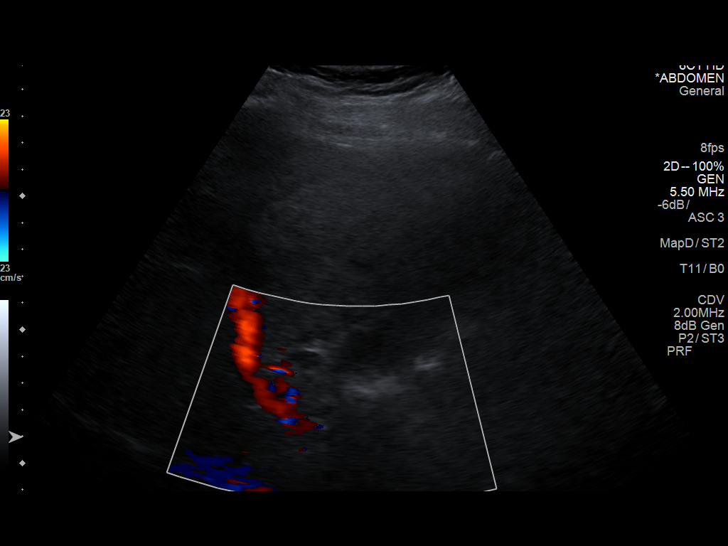
[im 68/82]
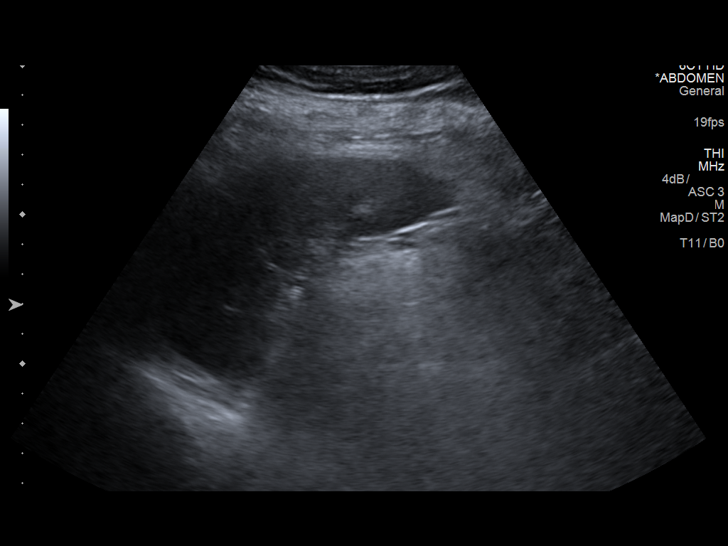
[im 75/82]
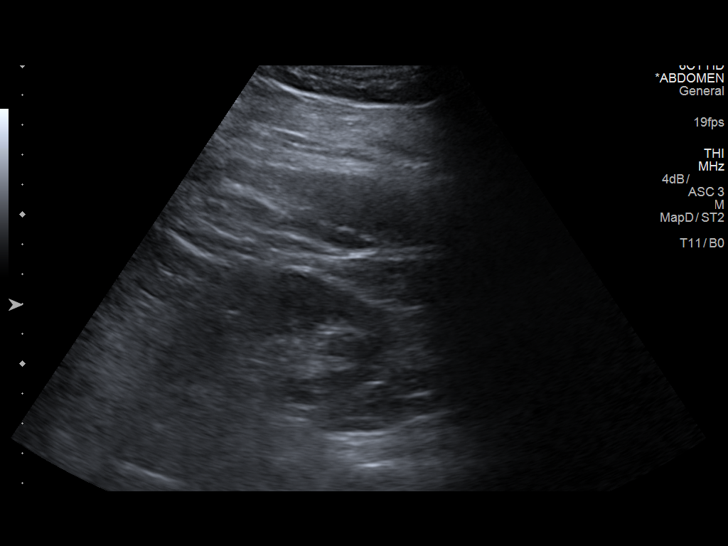
[im 82/82]
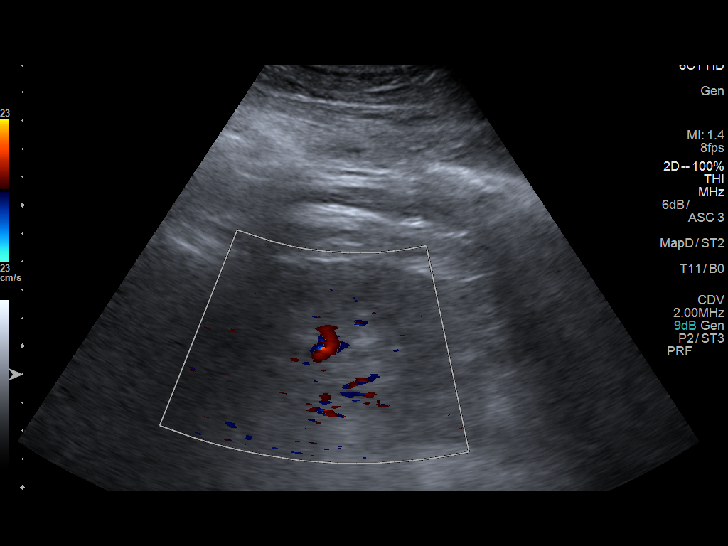

[13 of 25 positions shown; findings below may reference images not displayed]

FINDINGS: Gallbladder: Probable small stones. No gallbladder wall thickening
or pericholecystic fluid or positive sonographic Murphy's sign.

Common bile duct: Diameter: 6.7 mm

Liver: Visualization of the hepatic parenchyma is limited by bowel
poor penetration of the ultrasound beam. The echotexture is
subjectively increased. There is no focal mass or ductal dilation.
Portal vein is patent on color Doppler imaging with normal direction
of blood flow towards the liver.

IVC: No abnormality visualized.

Pancreas: Visualization of the pancreas is limited due to bowel gas.

Spleen: Size and appearance within normal limits.

Right Kidney: Length: 13.2 cm. Echogenicity within normal limits. No
mass or hydronephrosis visualized.

Left Kidney: Length: 11.9 cm. Echogenicity within normal limits. No
mass or hydronephrosis visualized.

Abdominal aorta: Bowel gas limits evaluation of the abdominal aorta.

Other findings: No significant ascites is observed.
IMPRESSION: The study is limited overall due to bowel gas and the patient's body
habitus. Multiple tiny gallstones are suspected. There is mild
prominence of the common bile duct without definite intraluminal
stones.

Increased hepatic echotexture compatible with fatty infiltrative
change.

Poor evaluation of the pancreas due to bowel gas and surrounding
inflammatory changes.

## 2022-11-13 ENCOUNTER — Telehealth: Payer: Self-pay | Admitting: *Deleted

## 2022-11-13 NOTE — Telephone Encounter (Signed)
error
